# Patient Record
Sex: Male | Born: 1935 | Race: Black or African American | Hispanic: No | Marital: Single | Smoking: Former smoker
Health system: Southern US, Community
[De-identification: ages and names within clinical notes are randomized; demographics above are authoritative.]

## PROBLEM LIST (undated history)

## (undated) DIAGNOSIS — M109 Gout, unspecified: Secondary | ICD-10-CM

## (undated) DIAGNOSIS — R569 Unspecified convulsions: Secondary | ICD-10-CM

## (undated) DIAGNOSIS — G40909 Epilepsy, unspecified, not intractable, without status epilepticus: Secondary | ICD-10-CM

## (undated) DIAGNOSIS — I639 Cerebral infarction, unspecified: Secondary | ICD-10-CM

## (undated) DIAGNOSIS — I10 Essential (primary) hypertension: Secondary | ICD-10-CM

## (undated) DIAGNOSIS — J690 Pneumonitis due to inhalation of food and vomit: Secondary | ICD-10-CM

## (undated) DIAGNOSIS — F0390 Unspecified dementia without behavioral disturbance: Secondary | ICD-10-CM

## (undated) DIAGNOSIS — M6259 Muscle wasting and atrophy, not elsewhere classified, multiple sites: Secondary | ICD-10-CM

## (undated) DIAGNOSIS — I69354 Hemiplegia and hemiparesis following cerebral infarction affecting left non-dominant side: Secondary | ICD-10-CM

## (undated) DIAGNOSIS — I82409 Acute embolism and thrombosis of unspecified deep veins of unspecified lower extremity: Secondary | ICD-10-CM

## (undated) DIAGNOSIS — J45909 Unspecified asthma, uncomplicated: Secondary | ICD-10-CM

## (undated) DIAGNOSIS — R1310 Dysphagia, unspecified: Secondary | ICD-10-CM

## (undated) HISTORY — PX: KNEE SURGERY: SHX244

## (undated) HISTORY — PX: BACK SURGERY: SHX140

---

## 2012-12-17 ENCOUNTER — Encounter (HOSPITAL_COMMUNITY): Payer: Self-pay | Admitting: Emergency Medicine

## 2012-12-17 ENCOUNTER — Emergency Department (HOSPITAL_COMMUNITY)
Admission: EM | Admit: 2012-12-17 | Discharge: 2012-12-17 | Disposition: A | Discharge status: Home / Self Care | Payer: Medicare Other | Attending: Emergency Medicine | Admitting: Emergency Medicine

## 2012-12-17 ENCOUNTER — Emergency Department (HOSPITAL_COMMUNITY): Payer: Medicare Other

## 2012-12-17 DIAGNOSIS — Y9389 Activity, other specified: Secondary | ICD-10-CM | POA: Insufficient documentation

## 2012-12-17 DIAGNOSIS — M1990 Unspecified osteoarthritis, unspecified site: Secondary | ICD-10-CM | POA: Insufficient documentation

## 2012-12-17 DIAGNOSIS — S79919A Unspecified injury of unspecified hip, initial encounter: Secondary | ICD-10-CM | POA: Insufficient documentation

## 2012-12-17 DIAGNOSIS — Y92009 Unspecified place in unspecified non-institutional (private) residence as the place of occurrence of the external cause: Secondary | ICD-10-CM | POA: Insufficient documentation

## 2012-12-17 DIAGNOSIS — R296 Repeated falls: Secondary | ICD-10-CM | POA: Insufficient documentation

## 2012-12-17 HISTORY — DX: Cerebral infarction, unspecified: I63.9

## 2012-12-17 HISTORY — DX: Unspecified convulsions: R56.9

## 2012-12-17 HISTORY — DX: Essential (primary) hypertension: I10

## 2012-12-17 HISTORY — DX: Unspecified asthma, uncomplicated: J45.909

## 2012-12-17 MED ORDER — HYDROCODONE-ACETAMINOPHEN 5-325 MG PO TABS: 1.0000 | ORAL_TABLET | ORAL | Status: DC | PRN

## 2012-12-17 NOTE — ED Notes (Signed)
Pt reports doing yard work yesterday and fell, denies syncope or dizziness at that time. Now having bilateral knee pain.

## 2012-12-17 NOTE — ED Notes (Signed)
Asher Muir (daughter) [phone]

## 2012-12-17 NOTE — ED Provider Notes (Signed)
CSN: [id number]     Arrival date & time 12/17/12  1027 History   First MD Initiated Contact with Patient 12/17/12 1035     Chief Complaint  Patient presents with  . Fall   (Consider location/radiation/quality/duration/timing/severity/associated sxs/prior Treatment) HPI  78 year old male presents to ED for evaluation of bilateral knee pain secondary to mechanical fall. History of prior stroke affecting his speech. Patient has history of right knee surgery and has arthritis. He endorsed chronic right knee pain and right hip pain usually improved with taking Bayer aspirin. Yesterday he while raking the leaves outside, states his leg at ankle, fell on the right side. Did not hit his head or having any loss of consciousness. Denies any precipitating symptoms prior to the fall. Patient is at home with daughter and daughter noticed that he is having increased difficulty walking. He occasionally use a cane but having to use his crutches to move around. He reports pain as a sharp sensation, nonradiating, worsening with palpation. No complaints of headache, chest pain or shortness of breath, abdominal pain, new numbness or weakness. He does not he may have broken any bones. He is scheduled to be seen at the [facility] clinic on December 15 for further management of arthritis.  No past medical history on file. No past surgical history on file. No family history on file. History  Substance Use Topics  . Smoking status: Not on file  . Smokeless tobacco: Not on file  . Alcohol Use: Not on file    Review of Systems  Constitutional: Negative for fever.  Genitourinary: Negative for dysuria.  Musculoskeletal: Positive for arthralgias. Negative for back pain.  Skin: Negative for rash and wound.  Neurological: Negative for numbness.    Allergies  Review of patient's allergies indicates not on file.  Home Medications  No current outpatient prescriptions on file. There were no vitals taken for this  visit. Physical Exam  Nursing note and vitals reviewed. Constitutional: He appears well-developed and well-nourished. No distress.  HENT:  Head: Atraumatic.  Eyes: Conjunctivae are normal.  Neck: Normal range of motion. Neck supple.  Musculoskeletal: He exhibits tenderness (tenderness to R hip at the trochanteric region with palpation and with ROM, no deformity.  R knee: tenderness to anterior knee with no joint laxity, no obvious edema, no rash.  normal knee flexion/extension.).  No midline spine tenderness, crepitus, step off  Neurological: He is alert.  Skin: No rash noted.  Psychiatric: He has a normal mood and affect.    ED Course  Procedures (including critical care time)  12:17 PM Pt report having R hip and R knee pain after mechanical fall.  Able to ambulate.  Xray of R hip and R knee without acute fx or dislocation.  Evidence of severe osteoarthritis, which certainly can account for pt's pain.  Since aspirin at home isn't helping with pain, then will prescribe short course of pain meds.  Ortho referral given, pt also will f/u with PCP on Dec 15th for long term care.    Labs Review Labs Reviewed - No data to display Imaging Review Dg Hip Complete Right  12/17/2012   CLINICAL DATA:  Fall.  Right hip pain.  EXAM: RIGHT HIP - COMPLETE 2+ VIEW  COMPARISON:  None.  FINDINGS: No fracture or dislocation is identified. The patient has severe right hip osteoarthritis. Much milder degree of left hip degenerative disease is seen. Soft tissue structures are unremarkable.  IMPRESSION: No acute finding.  Severe right hip osteoarthritis.  Electronically Signed   By: Drusilla Kanner M.D.   On: 12/17/2012 11:58   Dg Knee Complete 4 Views Right  12/17/2012   CLINICAL DATA:  Fall.  Right knee pain.  EXAM: RIGHT KNEE - COMPLETE 4+ VIEW  COMPARISON:  None.  FINDINGS: No acute bony or joint abnormality is identified. The patient has severe tricompartmental osteoarthritis which is worst in the  lateral compartment. Small joint effusion is noted.  IMPRESSION: No acute finding.  Severe tricompartmental osteoarthritis.   Electronically Signed   By: Drusilla Kanner M.D.   On: 12/17/2012 11:59    EKG Interpretation   None       MDM   1. Osteoarthritis (arthritis due to wear and tear of joints)    BP 131/85  Temp(Src) 97.7 F (36.5 C) (Oral)  Resp 24  SpO2 99%  I have reviewed nursing notes and vital signs. I personally reviewed the imaging tests through PACS system  I reviewed available ER/hospitalization records thought the EMR     Fayrene Helper, PA-C 12/17/12 1223

## 2012-12-18 NOTE — ED Provider Notes (Signed)
Medical screening examination/treatment/procedure(s) were conducted as a shared visit with non-physician practitioner(s) and myself.  I personally evaluated the patient during the encounter.  EKG Interpretation   None       Pt s/p fall. No syncope or dizziness. No palpitations. No recent cp or sob. Head atraumatic. Spine nt. Good rom bil ext. No deformity.  Berlin Hun, MD 12/18/12 [phone]

## 2013-02-16 ENCOUNTER — Encounter (HOSPITAL_COMMUNITY): Payer: Self-pay | Admitting: Emergency Medicine

## 2013-02-16 ENCOUNTER — Emergency Department (HOSPITAL_COMMUNITY)
Admission: EM | Admit: 2013-02-16 | Discharge: 2013-02-16 | Disposition: A | Discharge status: Home / Self Care | Payer: Medicare Other | Attending: Emergency Medicine | Admitting: Emergency Medicine

## 2013-02-16 ENCOUNTER — Emergency Department (HOSPITAL_COMMUNITY): Payer: Medicare Other

## 2013-02-16 DIAGNOSIS — Z8673 Personal history of transient ischemic attack (TIA), and cerebral infarction without residual deficits: Secondary | ICD-10-CM | POA: Insufficient documentation

## 2013-02-16 DIAGNOSIS — Z8669 Personal history of other diseases of the nervous system and sense organs: Secondary | ICD-10-CM | POA: Insufficient documentation

## 2013-02-16 DIAGNOSIS — Z880 Allergy status to penicillin: Secondary | ICD-10-CM | POA: Insufficient documentation

## 2013-02-16 DIAGNOSIS — Z87891 Personal history of nicotine dependence: Secondary | ICD-10-CM | POA: Insufficient documentation

## 2013-02-16 DIAGNOSIS — M79609 Pain in unspecified limb: Secondary | ICD-10-CM | POA: Insufficient documentation

## 2013-02-16 DIAGNOSIS — R609 Edema, unspecified: Secondary | ICD-10-CM | POA: Insufficient documentation

## 2013-02-16 DIAGNOSIS — I10 Essential (primary) hypertension: Secondary | ICD-10-CM | POA: Insufficient documentation

## 2013-02-16 DIAGNOSIS — J45909 Unspecified asthma, uncomplicated: Secondary | ICD-10-CM | POA: Insufficient documentation

## 2013-02-16 LAB — CBC
HCT: 37.8 % — ABNORMAL LOW (ref 39.0–52.0)
HEMOGLOBIN: 12.6 g/dL — AB (ref 13.0–17.0)
MCH: 27.1 pg (ref 26.0–34.0)
MCHC: 33.3 g/dL (ref 30.0–36.0)
MCV: 81.3 fL (ref 78.0–100.0)
Platelets: 228 10*3/uL (ref 150–400)
RBC: 4.65 MIL/uL (ref 4.22–5.81)
RDW: 15.5 % (ref 11.5–15.5)
WBC: 5.7 10*3/uL (ref 4.0–10.5)

## 2013-02-16 LAB — BASIC METABOLIC PANEL
BUN: 12 mg/dL (ref 6–23)
CALCIUM: 9.9 mg/dL (ref 8.4–10.5)
CO2: 28 meq/L (ref 19–32)
CREATININE: 0.78 mg/dL (ref 0.50–1.35)
Chloride: 98 mEq/L (ref 96–112)
GFR calc Af Amer: >90 mL/min (ref >90)
GFR, EST NON AFRICAN AMERICAN: 85 mL/min — AB (ref >90)
GLUCOSE: 96 mg/dL (ref 70–99)
Potassium: 4.9 mEq/L (ref 3.7–5.3)
Sodium: 138 mEq/L (ref 137–147)

## 2013-02-16 LAB — POCT I-STAT TROPONIN I: TROPONIN I, POC: 0.01 ng/mL (ref 0.00–0.08)

## 2013-02-16 LAB — PRO B NATRIURETIC PEPTIDE: Pro B Natriuretic peptide (BNP): 27.4 pg/mL (ref 0–450)

## 2013-02-16 MED ORDER — TRAMADOL HCL 50 MG PO TABS
50.0000 mg | ORAL_TABLET | Freq: Once | ORAL | Status: AC
  Administered 2013-02-16: 50 mg via ORAL
  Filled 2013-02-16: qty 1

## 2013-02-16 NOTE — Progress Notes (Signed)
VASCULAR LAB PRELIMINARY  PRELIMINARY  PRELIMINARY  PRELIMINARY  Bilateral lower extremity venous duplex completed.    Preliminary report:  Bilateral:  No evidence of DVT, superficial thrombosis, or Baker's Cyst.   Naquan Garman, RVS 02/16/2013, 5:22 PM

## 2013-02-16 NOTE — ED Provider Notes (Addendum)
CSN: [id number]     Arrival date & time 02/16/13  1159 History   First MD Initiated Contact with Patient 02/16/13 1625     Chief Complaint  Patient presents with  . Leg Swelling   (Consider location/radiation/quality/duration/timing/severity/associated sxs/prior Treatment) HPI Patient presents with bilateral left greater than right lower extremity swelling and pain. Patient has not been immobile for the past month. He been sleeping sitting in chair with his legs in the floor. He denies any shortness of breath or chest pain. He has no history of prior lower sugary swelling. He's had no fever chills. Denies any trauma. Past Medical History  Diagnosis Date  . Stroke   . Hypertension   . Asthma   . Seizures    Past Surgical History  Procedure Laterality Date  . Knee surgery     History reviewed. No pertinent family history. History  Substance Use Topics  . Smoking status: Former Games developer  . Smokeless tobacco: Not on file  . Alcohol Use: No    Review of Systems  Constitutional: Negative for fever and chills.  Respiratory: Negative for cough and shortness of breath.   Cardiovascular: Positive for leg swelling. Negative for chest pain and palpitations.  Gastrointestinal: Negative for nausea, vomiting, abdominal pain and diarrhea.  Musculoskeletal: Positive for myalgias. Negative for back pain, neck pain and neck stiffness.  Skin: Negative for wound.  Neurological: Negative for dizziness, weakness, light-headedness, numbness and headaches.  All other systems reviewed and are negative.    Allergies  Penicillins  Home Medications   Current Outpatient Rx  Name  Route  Sig  Dispense  Refill  . aspirin (BAYER ASPIRIN) 325 MG tablet   Oral   Take 650 mg by mouth daily as needed for mild pain.         Marland Kitchen HYDROcodone-acetaminophen (NORCO/VICODIN) 5-325 MG per tablet   Oral   Take 1 tablet by mouth every 4 (four) hours as needed for severe pain.   10 tablet   0    BP 107/76   Pulse 83  Temp(Src) 97.5 F (36.4 C) (Oral)  Resp 17  Ht 5\' 6"  (1.676 m)  Wt 160 lb (72.576 kg)  BMI 25.84 kg/m2  SpO2 97% Physical Exam  Nursing note and vitals reviewed. Constitutional: He is oriented to person, place, and time. He appears well-developed and well-nourished. No distress.  HENT:  Head: Normocephalic and atraumatic.  Mouth/Throat: Oropharynx is clear and moist.  Eyes: EOM are normal. Pupils are equal, round, and reactive to light.  Neck: Normal range of motion. Neck supple.  Cardiovascular: Normal rate and regular rhythm.   Pulmonary/Chest: Effort normal and breath sounds normal. No respiratory distress. He has no wheezes. He has no rales. He exhibits no tenderness.  Abdominal: Soft. Bowel sounds are normal. He exhibits no distension and no mass. There is no tenderness. There is no rebound and no guarding.  Musculoskeletal: Normal range of motion. He exhibits edema and tenderness.  2+ pitting edema to left foot and ankle. Patient has Tightness and tenderness with palpation. 1+ pitting edema to the right foot and ankle. No calf tenderness or swelling on the right.  Neurological: He is alert and oriented to person, place, and time.  Skin: Skin is warm and dry. No rash noted. No erythema.  Psychiatric: He has a normal mood and affect. His behavior is normal.    ED Course  Procedures (including critical care time) Labs Review Labs Reviewed  CBC - Abnormal; Notable for the  following:    Hemoglobin 12.6 (*)    HCT 37.8 (*)    All other components within normal limits  BASIC METABOLIC PANEL - Abnormal; Notable for the following:    GFR calc non Af Amer 85 (*)    All other components within normal limits  PRO B NATRIURETIC PEPTIDE  POCT I-STAT TROPONIN I   Imaging Review Dg Chest 2 View  02/16/2013   CLINICAL DATA:  Bilateral leg swelling  EXAM: CHEST  2 VIEW  COMPARISON:  None.  FINDINGS: Cardiomediastinal silhouette is unremarkable. Mild degenerative changes  thoracic spine. No acute infiltrate or pleural effusion. No pulmonary edema. Degenerative changes right shoulder.  IMPRESSION: No active cardiopulmonary disease.   Electronically Signed   By: Natasha MeadLiviu  Pop M.D.   On: 02/16/2013 13:58    EKG Interpretation    Date/Time:  Tuesday February 16 2013 12:16:34 EST Ventricular Rate:  75 PR Interval:  174 QRS Duration: 72 QT Interval:  374 QTC Calculation: 417 R Axis:   143 Text Interpretation:  Normal sinus rhythm Lateral infarct , age undetermined Possible Inferior infarct , age undetermined Abnormal ECG Reconfirmed by Winfred Redel  MD, Zaul Hubers [phone]([id number]) on 02/16/2013 11:43:43 PM            MDM   Ultrasound negative for DVT. No evidence of congestive heart failure. Suspect dependent peripheral edema. Suggested compression hose and keep the feet elevated. Advised that he followup with his primary Dr. Return precautions given.   Loren Raceravid Maeva Dant, MD 02/16/13 [phone]  Loren Raceravid Tanisia Yokley, MD 02/16/13 [phone]

## 2013-02-16 NOTE — ED Notes (Signed)
Pt reports swelling to bilateral ankles x 2 days. Denies recent cough or sob. Denies hx of chf. Airway intact at triage, ekg being done.

## 2013-02-16 NOTE — ED Notes (Signed)
Pt daughter Asher MuirJamie contact number [phone]. Pt daughter reports pt needs food and drink.

## 2013-02-16 NOTE — Discharge Instructions (Signed)
Peripheral Edema °You have swelling in your legs (peripheral edema). This swelling is due to excess accumulation of salt and water in your body. Edema may be a sign of heart, kidney or liver disease, or a side effect of a medication. It may also be due to problems in the leg veins. Elevating your legs and using special support stockings may be very helpful, if the cause of the swelling is due to poor venous circulation. Avoid long periods of standing, whatever the cause. °Treatment of edema depends on identifying the cause. Chips, pretzels, pickles and other salty foods should be avoided. Restricting salt in your diet is almost always needed. Water pills (diuretics) are often used to remove the excess salt and water from your body via urine. These medicines prevent the kidney from reabsorbing sodium. This increases urine flow. °Diuretic treatment may also result in lowering of potassium levels in your body. Potassium supplements may be needed if you have to use diuretics daily. Daily weights can help you keep track of your progress in clearing your edema. You should call your caregiver for follow up care as recommended. °SEEK IMMEDIATE MEDICAL CARE IF:  °· You have increased swelling, pain, redness, or heat in your legs. °· You develop shortness of breath, especially when lying down. °· You develop chest or abdominal pain, weakness, or fainting. °· You have a fever. °Document Released: 02/22/2004 Document Revised: 04/08/2011 Document Reviewed: 02/01/2009 °ExitCare® Patient Information ©2014 [employer]. ° ° ° °Emergency Department Resource Guide °1) Find a Doctor and Pay Out of Pocket °Although you won't have to find out who is covered by your insurance plan, it is a good idea to ask around and get recommendations. You will then need to call the office and see if the doctor you have chosen will accept you as a new patient and what types of options they offer for patients who are self-pay. Some doctors offer  discounts or will set up payment plans for their patients who do not have insurance, but you will need to ask so you aren't surprised when you get to your appointment. ° °2) Contact Your Local Health Department °Not all health departments have doctors that can see patients for sick visits, but many do, so it is worth a call to see if yours does. If you don't know where your local health department is, you can check in your phone book. The CDC also has a tool to help you locate your state's health department, and many state websites also have listings of all of their local health departments. ° °3) Find a Walk-in Clinic °If your illness is not likely to be very severe or complicated, you may want to try a walk in clinic. These are popping up all over the country in pharmacies, drugstores, and shopping centers. They're usually staffed by nurse practitioners or physician assistants that have been trained to treat common illnesses and complaints. They're usually fairly quick and inexpensive. However, if you have serious medical issues or chronic medical problems, these are probably not your best option. ° °No Primary Care Doctor: °- Call [facility] at  832-8000 - they can help you locate a primary care doctor that  accepts your insurance, provides certain services, etc. °- Physician Referral Service- 1-800-533-3463 ° °Chronic Pain Problems: °Organization         Address  Phone   Notes  °[facility]  (336) 297-2271 Patients need to be referred by their primary care doctor.  ° °  Medication Assistance: °Organization         Address  Phone   Notes  °[place] [facility] [address] °[place], [place] [address] (336) 641-8030 --Must be a resident of [place] °-- Must have NO insurance coverage whatsoever (no Medicaid/ Medicare, etc.) °-- The pt. MUST have a primary care doctor that directs their care regularly and follows them in the community °  °[facility]   (866) 331-1348   °[employer]  (888) 892-1162   ° °Agencies that provide inexpensive medical care: °Organization         Address  Phone   Notes  °[facility]  (336) 832-8035   °[facility]    (336) 832-7272   °[facility] [address] °[place], [place] [address] (336) 832-4777   °[facility] [address], °[place] (336) 271-4999   °[facility]    (336) 373-0678   °[facility]    (336) 272-1050   °[facility] ° [address], [place] Phone:  (336) 832-4444, Fax:  (336) 832-4440 Hours of Operation:  9 am - 6 pm, M-F.  Also accepts Medicaid/Medicare and self-pay.  °[facility] ° [address], [place] Phone: (336) 832-3150, Fax: (336) 832-3151. Hours of Operation:  8:30 am - 5:30 pm, M-F.  Also accepts Medicaid and self-pay.  °[facility] [place] [address], [place] Phone: (336) 878-6027   °[facility] [address], [place], [place] (336)723-1848, Ext. 123 Mondays & Thursdays: 7-9 AM.  First 15 patients are seen on a first come, first serve basis. °  ° °Medicaid-accepting [place] Providers: ° °Organization         Address  Phone   Notes  °[facility] [address], [place] (336) 641-2100 Also accepts self-pay patients.  °[facility] [address], [place] ° (336) 856-9996   °[facility] [address], [place] (336) 288-8857   °[facility] [address] [place] [address], [place] (336) 299-7000   °Veita Bland [address], [place]  ° (336) 373-1557 Only accepts [place] Access Medicaid patients after they have their name applied to their card.  ° °Self-Pay (no insurance) in [place]: ° °Organization         Address  Phone   Notes  °Sickle Cell Patients, [facility] [address], [place] (336) 832-1970   °[facility] [address], [place] (336) 832-4400   °[facility] ° [address], [place] (336) 992-4800   °[facility]/Dr. Osei-Bonsu ° [address] [place] [address], [place] or [address], [place] (336) 841-8500 Phone number for both [place] and [place] locations is the same.  °[facility] [address], [place] (336) 299-0000   °[facility] [address] [place] [address], [place] or [address] (336) 852-7530 °(336) 878-2260   °[facility] [address], [place] (336) 350-1642, phone; (336) 294-5005, fax Sees patients 1st and 3rd Saturday of every month.  Must not qualify for public or private insurance (i.e. Medicaid, Medicare, [place] Health Choice, Veterans' Benefits) • Household income should be no more than 200% of the poverty level •The   clinic cannot treat you if you are pregnant or think you are pregnant • Sexually transmitted diseases are not treated at the clinic.  ° ° °Dental Care: °Organization         Address  Phone  Notes  °[facility] [address], [place] (336) 641-6152 Accepts children up to age 21 who are enrolled in Medicaid or [place] Health Choice; pregnant women with a Medicaid card; and children who have applied for Medicaid or [place] Health Choice, but were declined, whose parents can pay a reduced fee at time of service.  °[facility] [place]  [address], [place] (336) 641-7733 Accepts children up to age 21 who are enrolled in Medicaid or [place] Health Choice; pregnant women with a Medicaid card; and children who have applied for Medicaid or [place] Health Choice, but were declined, whose parents can pay a reduced fee at time of service.  °[facility] ° [address], [place]  (336) 641-4533 Patients are seen by appointment only. Walk-ins are not accepted. [facility] will see patients 18 years of age and older. °Monday - Tuesday (8am-5pm) °Most Wednesdays (8:30-5pm) °$30 per visit, cash only  °[facility] ° [address], [place] (336) 641-4533 Patients are seen by appointment only. Walk-ins are not accepted. [facility] will see patients 18 years of age and older. °One Wednesday Evening (Monthly: Volunteer Based).  $30 per visit, cash only  °[facility]  (919) 537-3737 for adults; Children under age 4, call Graduate Pediatric Dentistry at (919) [phone]. Children aged 4-14, please call (919) [phone] to request a pediatric application. ° Dental services are provided in all areas of dental care including fillings, crowns and bridges, complete and partial dentures, implants, gum treatment, root canals, and extractions. Preventive care is also provided. Treatment is provided to both adults and children. °Patients are selected via a lottery and there is often a waiting list. °  °[facility] [address], °[place] ° (336) 763-8833 [email or web address] °  °[facility] [address], [place], [place] (336)723-1848, Ext. 123 Second and Fourth Thursday of each month, opens at 6:30 AM; Clinic ends at 9 AM.  Patients are seen on a first-come first-served basis, and a limited number are seen during each clinic.  ° °[facility] ° [address], [place], [place] (336) 723-7904   Eligibility Requirements °You must have lived in [place], or [place] counties for at least the last three months. °  You cannot be eligible for state or federal sponsored healthcare insurance, including Veterans Administration, Medicaid, or Medicare. °  You generally cannot be eligible for healthcare insurance through your employer.  °  How to apply: °Eligibility screenings are held every Tuesday and Wednesday  afternoon from 1:00 pm until 4:00 pm. You do not need an appointment for the interview!  °[facility] [address], [place], [place] 336-631-2330   °[facility]  336-342-8273   °[facility]  336-703-3100   °[facility]  336-570-6415   ° °Behavioral Health Resources in the Community: °Intensive Outpatient Programs °Organization         Address  Phone  Notes  °[facility] [address], [place], [place] 336-878-6098   °[facility] [address], [place],   [place] 336-832-9800   °[facility] [address], [place], [place] ° 336-882-2125   °[facility] [address],  °[place], [place] 1-800-853-5163 or 336-641-4981   °Substance Abuse Resources °Organization         Address  Phone  Notes  °[facility]  336-882-2125   °[facility]  336-784-9470   °[facility]  336-285-9073   °[facility]  336-845-3988   °Residential & Outpatient Substance Abuse Program  1-800-659-3381   °Psychological Services °Organization         Address  Phone  Notes  °[facility]  336- 832-9600   °[facility]  336- 378-7881   °[facility] [address], [place] 1-800-853-5163 or 336-641-4981   ° °Mobile Crisis Teams °Organization         Address  Phone  Notes  °[facility], Mobile Crisis Care Unit  1-877-626-1772   °Assertive °[facility] ° [address] [place], [place] 336-834-9664   °Sharon DeEsch [address] °[place] 336-554-5454   ° °Self-Help/Support Groups °Organization         Address  Phone             Notes  °[facility] [place] - variety of support groups  336- 373-1402 Call for more information  °Narcotics Anonymous (NA), [facility] [address], °[place]  2 meetings at this location  ° °Residential Treatment  Programs °Organization         Address  Phone  Notes  °[facility] [address],    °[place]  1-866-801-8205   °[facility] ° [address], [place], [place] 704-293-8524   °[facility] [address], [place] 336-845-3988 Admissions: 8am-3pm M-F  °[facility] [address]    °[place], [place] 336-841-1104   °[facility] [address], [place], [place] 336-379-7146   °[facility] [address]  °[place], [place] 336-285-9073   °[facility] - Intensive Outpatient [address], [place], [place] 336-852-3033   °[facility] [address]  °[place], [place] 1-877-615-2722 or 336-784-9470   °[facility] [address] [place] [address] [place], [place] 336-227-7417 Accepts Medicaid  °[facility] [address]  °[place] 1-800-659-3381 Substance Abuse/Addiction Treatment  ° °[place] Behavioral Health Resources °Organization         Address  Phone  Notes  °[facility]  (888) 581-9988   °Julie Brannon, PhD [address] [place], [place]   (336) 349-5553 or (336) 951-0000   °[facility]   [address] °[place], [place] (336) 349-4454   °[facility] [address], [place], [place] (336) 342-8316 Insurance/Medicaid/sponsorship through [facility]  °[facility] [address]                                    [place], [place] (336) 342-8316 Therapy/tele-psych/case  °[facility] [address]  ° [place], [place] (336) 349-2233    °Dr. Arfeen  (336) 349-4544   °[facility] [place]  [employer] [place] [facility] 1) [address],  °2) [address], [place] °3)  [address], [place] (336) 349-3220 °(336) 342-7768 ° °(  336) 342-8140   °[place] [facility] (336) 342-1394 or (336) 342-3537 (After Hours)    ° ° ° °

## 2013-02-17 DIAGNOSIS — M7989 Other specified soft tissue disorders: Secondary | ICD-10-CM

## 2013-02-17 DIAGNOSIS — M79609 Pain in unspecified limb: Secondary | ICD-10-CM

## 2013-06-25 ENCOUNTER — Encounter (HOSPITAL_COMMUNITY): Payer: Self-pay | Admitting: Emergency Medicine

## 2013-06-25 ENCOUNTER — Emergency Department (HOSPITAL_COMMUNITY)
Admission: EM | Admit: 2013-06-25 | Discharge: 2013-06-25 | Disposition: A | Discharge status: Home / Self Care | Payer: Medicare Other | Attending: Emergency Medicine | Admitting: Emergency Medicine

## 2013-06-25 DIAGNOSIS — M79606 Pain in leg, unspecified: Secondary | ICD-10-CM

## 2013-06-25 DIAGNOSIS — G8929 Other chronic pain: Secondary | ICD-10-CM

## 2013-06-25 DIAGNOSIS — M629 Disorder of muscle, unspecified: Secondary | ICD-10-CM | POA: Insufficient documentation

## 2013-06-25 DIAGNOSIS — M79609 Pain in unspecified limb: Secondary | ICD-10-CM | POA: Insufficient documentation

## 2013-06-25 DIAGNOSIS — Z87891 Personal history of nicotine dependence: Secondary | ICD-10-CM | POA: Insufficient documentation

## 2013-06-25 DIAGNOSIS — I10 Essential (primary) hypertension: Secondary | ICD-10-CM | POA: Insufficient documentation

## 2013-06-25 DIAGNOSIS — Z8673 Personal history of transient ischemic attack (TIA), and cerebral infarction without residual deficits: Secondary | ICD-10-CM | POA: Insufficient documentation

## 2013-06-25 DIAGNOSIS — Z79899 Other long term (current) drug therapy: Secondary | ICD-10-CM | POA: Insufficient documentation

## 2013-06-25 DIAGNOSIS — Z880 Allergy status to penicillin: Secondary | ICD-10-CM | POA: Insufficient documentation

## 2013-06-25 DIAGNOSIS — J45909 Unspecified asthma, uncomplicated: Secondary | ICD-10-CM | POA: Insufficient documentation

## 2013-06-25 DIAGNOSIS — M2420 Disorder of ligament, unspecified site: Secondary | ICD-10-CM | POA: Insufficient documentation

## 2013-06-25 MED ORDER — DOXYCYCLINE HYCLATE 100 MG PO CAPS: 100.0000 mg | ORAL_CAPSULE | Freq: Two times a day (BID) | ORAL | Status: DC

## 2013-06-25 MED ORDER — BENZONATATE 100 MG PO CAPS: 100.0000 mg | ORAL_CAPSULE | Freq: Three times a day (TID) | ORAL | Status: DC

## 2013-06-25 MED ORDER — TRAMADOL HCL 50 MG PO TABS: 50.0000 mg | ORAL_TABLET | Freq: Four times a day (QID) | ORAL | Status: DC | PRN

## 2013-06-25 MED ORDER — TRAMADOL HCL 50 MG PO TABS
50.0000 mg | ORAL_TABLET | Freq: Once | ORAL | Status: AC
  Administered 2013-06-25: 50 mg via ORAL
  Filled 2013-06-25: qty 1

## 2013-06-25 NOTE — ED Notes (Signed)
Care management at bedside.

## 2013-06-25 NOTE — ED Notes (Signed)
Care management aware of order and family/daughter request for help at home.

## 2013-06-25 NOTE — ED Notes (Addendum)
Upon assessment pt unable to lift right leg. Pt flexion and dorsiflexion weaker on right side. Pt reports this is his normal since his stroke. Pt DENIES change in symptoms/defects.   Gwendolyn Grant MD notified of ALL assessment detail up to this pt. Verbalizes will be in to assess pt.

## 2013-06-25 NOTE — ED Notes (Signed)
Bed: WA23 Expected date:  Expected time:  Means of arrival:  Comments: EMS-forgot 

## 2013-06-25 NOTE — ED Notes (Signed)
[place] MD at bedside. 

## 2013-06-25 NOTE — Progress Notes (Signed)
06/25/2013 A. Orman Matsumura RNCM 1932pm Patient's daughter reports patient's pcp is located at the [place] [facility] in [facility].  System updated.

## 2013-06-25 NOTE — Progress Notes (Signed)
06/25/2013 Johnathan Mitchell RNCM 1926pm EDCM spoke to daughter Johnathan Mitchell who is now aware and agreeable of home health services. Patient's daughter reports patient does not have a Medicare card.  EDCM informed patient's daughter she may call Medicare to request a new card.  EDCM provided patient's daughter with contact information for Medicare.  EDCM also provided patient with list of doctors who accept Medicare insurance within a ten mile radius of patient's zip code.  Patient's daughter requesting wheelchair for patient.  EDCM will ask EDP to write prescritption for manual wheelchair for patient so that patient's daughter may purchase wheelchair from medical supply store.  Patient's daughter thankful for resources.  No further EDCM need at this time.

## 2013-06-25 NOTE — ED Notes (Signed)
Per Gwendolyn Grant spoke with pt daughter. Daughter reports pt pain so severe they decided to send to hospital to control pain. Daughter request help at home.

## 2013-06-25 NOTE — ED Notes (Signed)
Pt reports has not been able to take off socks so has showered with them for 3 days without taking them off. Pt feet dry and flaky bilaterally.

## 2013-06-25 NOTE — Progress Notes (Signed)
  CARE MANAGEMENT ED NOTE 06/25/2013  Patient:  Johnathan Mitchell, Johnathan Mitchell   Account Number:  [id number]  Date Initiated:  06/25/2013  Documentation initiated by:  Radford Pax  Subjective/Objective Assessment:   Patient presents to Ed with pain to right leg.     Subjective/Objective Assessment Detail:   Patient has a history of chronic leg pain.     Action/Plan:   Action/Plan Detail:   Anticipated DC Date:  06/25/2013     Status Recommendation to Physician:   Result of Recommendation:    Other ED Services  Consult Working Plan    DC Planning Services  Other  PCP issues   [facility] Choice  HOME HEALTH   Choice offered to / List presented to:  C-1 Patient     HH arranged  HH-1 RN  HH-2 PT  HH-3 OT  HH-4 NURSE'S AIDE  HH-6 SOCIAL WORKER      HH agency  [facility]    Status of service:  Completed, signed off  ED Comments:   ED Comments Detail:  EDCM spoke to patient at bedside.  Patient reports he lives at home with his daughter. Patient currently does not have any home health services.  Patient reports he has a walker, cane and safety rails in the bathroom at home.  Patient reports he has the "grabbers"  that help him pick up things.  Patient reports he washes and dresses himself and his daughter helps him with meals.  Patient reports he is able to ambulate with his cane at home without difficulty. EDCM provided patient with a list of home health agencies in [facility]  EDCM explained to patient that with home health he will be able to receive a visiting RN, PT, OT, aide and social worker if needed.  EDCM explained to patient the agency of his choice will have 24-48 hours to contact him. Patient has chosen [facility].  EDCM also provided patient with list of private duty nursing services and printed information regarding the [facility] [place].  EDCM attempted to call patient's daughter Asher Muir at [phone] without success.  EDCM discussed patient with  EDP who will place home health orders for RN, PT, OT, aide and social worker with face to face.  Patient thankful for assistance.  No further EDCM needs at this time.

## 2013-06-25 NOTE — ED Provider Notes (Signed)
CSN: [id number]     Arrival date & time 06/25/13  1714 History   First MD Initiated Contact with Patient 06/25/13 1715     Chief Complaint  Patient presents with  . Right Leg Pain      (Consider location/radiation/quality/duration/timing/severity/associated sxs/prior Treatment) Patient is a 79 y.o. male presenting with leg pain. The history is provided by the patient.  Leg Pain Location:  Leg Time since incident: Years. Leg location:  L leg, R leg and R upper leg Pain details:    Quality:  Aching   Radiates to:  Does not radiate   Severity:  Moderate   Onset quality:  Gradual Associated symptoms: no fever     Past Medical History  Diagnosis Date  . Hypertension   . Asthma   . Seizures   . Stroke    Past Surgical History  Procedure Laterality Date  . Knee surgery     No family history on file. History  Substance Use Topics  . Smoking status: Former Games developermoker  . Smokeless tobacco: Not on file  . Alcohol Use: No    Review of Systems  Constitutional: Negative for fever.  Respiratory: Negative for cough and shortness of breath.   Gastrointestinal: Negative for vomiting.  All other systems reviewed and are negative.     Allergies  Penicillins  Home Medications   Prior to Admission medications   Medication Sig Start Date End Date Taking? Authorizing Provider  atorvastatin (LIPITOR) 20 MG tablet Take 10 mg by mouth at bedtime.    Historical Provider, MD  donepezil (ARICEPT) 10 MG tablet Take 5 mg by mouth every morning.    Historical Provider, MD  etodolac (LODINE) 200 MG capsule Take 200 mg by mouth daily as needed for moderate pain.     Historical Provider, MD  lisinopril (PRINIVIL,ZESTRIL) 5 MG tablet Take 2.5 mg by mouth daily.    Historical Provider, MD  naproxen sodium (ANAPROX) 220 MG tablet Take 440 mg by mouth daily as needed (for pain).    Historical Provider, MD   BP 129/77  Pulse 66  Temp(Src) 98.6 F (37 C) (Oral)  Resp 12  SpO2 100% Physical  Exam  Nursing note and vitals reviewed. Constitutional: He is oriented to person, place, and time. He appears well-developed and well-nourished. No distress.  HENT:  Head: Normocephalic and atraumatic.  Mouth/Throat: No oropharyngeal exudate.  Eyes: EOM are normal. Pupils are equal, round, and reactive to light.  Neck: Normal range of motion. Neck supple.  Cardiovascular: Normal rate and regular rhythm.  Exam reveals no friction rub.   No murmur heard. Pulmonary/Chest: Effort normal and breath sounds normal. No respiratory distress. He has no wheezes. He has no rales.  Abdominal: Soft. He exhibits no distension. There is no tenderness. There is no rebound.  Musculoskeletal: Normal range of motion. He exhibits no edema.  R leg weaknes R leg diffuse tenderness, no signs of trauma, no swelling  Neurological: He is alert and oriented to person, place, and time.  Skin: He is not diaphoretic.    ED Course  Procedures (including critical care time) Labs Review Labs Reviewed - No data to display  Imaging Review No results found.   EKG Interpretation None      MDM   Final diagnoses:  Chronic leg pain    78M here with chronic leg pain. States leg pain present since he had his stroke a long time ago. No acute worsening. No falls. Cannot move R leg, not  new - had difficulty with movement since the stroke. He cannot remember why he is here. He lives with his daughter, will call his daughter to speak with her. Vitals stable. Patient relaxing comfortably.  I spoke with daughter - she states worsening pain of late - reports increasing falls - 3 over last 6 months. She expressed the need for home health. She denies any falls today, any trauma. Case management spoke with patient, HomeHealth face-to-face eval recommended, orders put in by me. Case management to speak with daughter. Will give tramadol for stronger pain relief.   Dagmar Hait, MD 06/25/13 1911

## 2013-06-25 NOTE — Discharge Instructions (Signed)
Chronic Pain Chronic pain can be defined as pain that is off and on and lasts for 3 6 months or longer. Many things cause chronic pain, which can make it difficult to make a diagnosis. There are many treatment options available for chronic pain. However, finding a treatment that works well for you may require trying various approaches until the right one is found. Many people benefit from a combination of two or more types of treatment to control their pain. SYMPTOMS  Chronic pain can occur anywhere in the body and can range from mild to very severe. Some types of chronic pain include:  Headache.  Low back pain.  Cancer pain.  Arthritis pain.  Neurogenic pain. This is pain resulting from damage to nerves. People with chronic pain may also have other symptoms such as:  Depression.  Anger.  Insomnia.  Anxiety. DIAGNOSIS  Your health care provider will help diagnose your condition over time. In many cases, the initial focus will be on excluding possible conditions that could be causing the pain. Depending on your symptoms, your health care provider may order tests to diagnose your condition. Some of these tests may include:   Blood tests.   CT scan.   MRI.   X-rays.   Ultrasounds.   Nerve conduction studies.  You may need to see a specialist.  TREATMENT  Finding treatment that works well may take time. You may be referred to a pain specialist. He or she may prescribe medicine or therapies, such as:   Mindful meditation or yoga.  Shots (injections) of numbing or pain-relieving medicines into the spine or area of pain.  Local electrical stimulation.  Acupuncture.   Massage therapy.   Aroma, color, light, or sound therapy.   Biofeedback.   Working with a physical therapist to keep from getting stiff.   Regular, gentle exercise.   Cognitive or behavioral therapy.   Group support.  Sometimes, surgery may be recommended.  HOME CARE INSTRUCTIONS    Take all medicines as directed by your health care provider.   Lessen stress in your life by relaxing and doing things such as listening to calming music.   Exercise or be active as directed by your health care provider.   Eat a healthy diet and include things such as vegetables, fruits, fish, and lean meats in your diet.   Keep all follow-up appointments with your health care provider.   Attend a support group with others suffering from chronic pain. SEEK MEDICAL CARE IF:   Your pain gets worse.   You develop a new pain that was not there before.   You cannot tolerate medicines given to you by your health care provider.   You have new symptoms since your last visit with your health care provider.  SEEK IMMEDIATE MEDICAL CARE IF:   You feel weak.   You have decreased sensation or numbness.   You lose control of bowel or bladder function.   Your pain suddenly gets much worse.   You develop shaking.  You develop chills.  You develop confusion.  You develop chest pain.  You develop shortness of breath.  MAKE SURE YOU:  Understand these instructions.  Will watch your condition.  Will get help right away if you are not doing well or get worse. Document Released: 10/06/2001 Document Revised: 09/16/2012 Document Reviewed: 07/10/2012 [facility] Patient Information 2014 [place], [place].  Bronchitis Bronchitis is inflammation of the airways that extend from the windpipe into the lungs (bronchi). The inflammation often  causes mucus to develop, which leads to a cough. If the inflammation becomes severe, it may cause shortness of breath. CAUSES  Bronchitis may be caused by:   Viral infections.   Bacteria.   Cigarette smoke.   Allergens, pollutants, and other irritants.  SIGNS AND SYMPTOMS  The most common symptom of bronchitis is a frequent cough that produces mucus. Other symptoms include:  Fever.   Body aches.   Chest congestion.    Chills.   Shortness of breath.   Sore throat.  DIAGNOSIS  Bronchitis is usually diagnosed through a medical history and physical exam. Tests, such as chest X-rays, are sometimes done to rule out other conditions.  TREATMENT  You may need to avoid contact with whatever caused the problem (smoking, for example). Medicines are sometimes needed. These may include:  Antibiotics. These may be prescribed if the condition is caused by bacteria.  Cough suppressants. These may be prescribed for relief of cough symptoms.   Inhaled medicines. These may be prescribed to help open your airways and make it easier for you to breathe.   Steroid medicines. These may be prescribed for those with recurrent (chronic) bronchitis. HOME CARE INSTRUCTIONS  Get plenty of rest.   Drink enough fluids to keep your urine clear or pale yellow (unless you have a medical condition that requires fluid restriction). Increasing fluids may help thin your secretions and will prevent dehydration.   Only take over-the-counter or prescription medicines as directed by your health care provider.  Only take antibiotics as directed. Make sure you finish them even if you start to feel better.  Avoid secondhand smoke, irritating chemicals, and strong fumes. These will make bronchitis worse. If you are a smoker, quit smoking. Consider using nicotine gum or skin patches to help control withdrawal symptoms. Quitting smoking will help your lungs heal faster.   Put a cool-mist humidifier in your bedroom at night to moisten the air. This may help loosen mucus. Change the water in the humidifier daily. You can also run the hot water in your shower and sit in the bathroom with the door closed for 5 10 minutes.   Follow up with your health care provider as directed.   Wash your hands frequently to avoid catching bronchitis again or spreading an infection to others.  SEEK MEDICAL CARE IF: Your symptoms do not improve  after 1 week of treatment.  SEEK IMMEDIATE MEDICAL CARE IF:  Your fever increases.  You have chills.   You have chest pain.   You have worsening shortness of breath.   You have bloody sputum.  You faint.  You have lightheadedness.  You have a severe headache.   You vomit repeatedly. MAKE SURE YOU:   Understand these instructions.  Will watch your condition.  Will get help right away if you are not doing well or get worse. Document Released: 01/14/2005 Document Revised: 11/04/2012 Document Reviewed: 09/08/2012 [facility] Patient Information 2014 [place], [place].   Emergency Department Resource Guide 1) Find a Doctor and Pay Out of Pocket Although you won't have to find out who is covered by your insurance plan, it is a good idea to ask around and get recommendations. You will then need to call the office and see if the doctor you have chosen will accept you as a new patient and what types of options they offer for patients who are self-pay. Some doctors offer discounts or will set up payment plans for their patients who do not have insurance, but you  will need to ask so you aren't surprised when you get to your appointment.  2) Contact Your Local Health Department Not all health departments have doctors that can see patients for sick visits, but many do, so it is worth a call to see if yours does. If you don't know where your local health department is, you can check in your phone book. The CDC also has a tool to help you locate your state's health department, and many state websites also have listings of all of their local health departments.  3) Find a Walk-in Clinic If your illness is not likely to be very severe or complicated, you may want to try a walk in clinic. These are popping up all over the country in pharmacies, drugstores, and shopping centers. They're usually staffed by nurse practitioners or physician assistants that have been trained to treat common  illnesses and complaints. They're usually fairly quick and inexpensive. However, if you have serious medical issues or chronic medical problems, these are probably not your best option.  No Primary Care Doctor: - Call Health Connect at  [phone] - they can help you locate a primary care doctor that  accepts your insurance, provides certain services, etc. - Physician Referral Service- [phone]  Chronic Pain Problems: Organization         Address  Phone   Notes  [facility]  [phone] Patients need to be referred by their primary care doctor.   Medication Assistance: Organization         Address  Phone   Notes  [facility]ication [facility] [address] [place], [place] [id number] [phone] --Must be a resident of [place] [facility] [place] -- Must have NO insurance coverage whatsoever (no Medicaid/ Medicare, etc.) -- The pt. MUST have a primary care doctor that directs their care regularly and follows them in the community   MedAssist  [phone]   [employer]  [phone]    Agencies that provide inexpensive medical care: Organization         Address  Phone   Notes  [facility]  [phone]) [phone]   [facility]    [phone]   [facility] [address] [place], [place] [address] [phone]   [facility] [address], [place] [phone]   Planned Parenthood    [phone]   [facility]    [phone]   Community Health and [facility]  [address], [place] Phone:  [phone], Fax:  [phone] Hours of Operation:  9 am - 6 pm, M-F.  Also accepts Medicaid/Medicare and self-pay.  [facility]  [address], [place] Phone: [phone], Fax: [phone]. Hours of Operation:  8:30 am - 5:30 pm, M-F.  Also accepts Medicaid and self-pay.  [facility] [place]  [address], [place] Phone: [phone]   [facility] [address], [place], [place] [phone], Ext. [phone] Mondays & Thursdays: 7-9 AM.  First 15 patients are seen on a first come, first serve basis.    Medicaid-accepting [facility] Providers:  Organization         Address  Phone   Notes  [facility] [address], [place] [phone]) [phone] Also accepts self-pay patients.  [facility] [address], [place] [address],  [place]  [phone]   [facility] [address], [place] [phone]   [facility] [address], [place] [phone]   Renaye Rakers [address], [place]   [phone] Only accepts [place] Access [place] patients after they have their name applied to their card.   Self-Pay (no insurance) in [facility]:  Organization         Address  Phone   Notes  Sickle Cell Patients, [facility] [address] [place], [place] [phone]   [facility] [address] [place], [place] [phone]   [facility] [place]  [address], [place] [phone]   [facility]/Dr. Osei-Bonsu  [address] [place] or [address], [place] [phone] Phone number for both [place] and [place] locations is the same.  Urgent Medical and [facility] [address], [place] [phone]   [facility] [address], [place] or [address] [phone]   [facility] [address] [place] [phone], phone; [phone], fax Sees patients 1st and 3rd Saturday of every month.  Must not qualify for public or private insurance (i.e. Medicaid, Medicare, [place] Health Choice, Veterans' Benefits)  Household income should be no more than 200% of the  poverty level The clinic cannot treat you if you are pregnant or think you are pregnant  Sexually transmitted diseases are not treated at the clinic.    Dental Care: Organization         Address  Phone  Notes  [facility] Department of [facility] [address] [place], [place] [phone] Accepts children up to age 72 who are enrolled in [place] or [place] Health Choice; pregnant women with a Medicaid card; and children who have applied for Medicaid or  Health Choice, but were declined, whose parents can pay a reduced fee at time of service.  [facility] Department of [facility]  [address], [place] [phone] Accepts children up to age 77 who are enrolled in [place] or  Health Choice; pregnant women with a Medicaid card; and children who have applied for Medicaid or  Health Choice, but were declined, whose parents can pay a reduced fee at time of service.  [facility]  [address] [place], [place] [phone] Patients are seen by appointment only. Walk-ins are not accepted. [facility] will see patients 19 years of age and older. Monday - Tuesday (8am-5pm) Most Wednesdays (8:30-5pm) $30 per visit, cash only  [facility]  [address], [facility] [phone] Patients are seen by appointment only. Walk-ins are not accepted. [facility] will see patients 61 years of age and older. One Wednesday Evening (Monthly: Volunteer Based).  $30 per visit, cash only  [employer]  [phone] for adults; Children under age 6, call Graduate Pediatric Dentistry at [phone]. Children aged 23-14, please call [phone] to request a pediatric application.  Dental services are provided in all areas of dental care including fillings, crowns and bridges, complete and partial dentures, implants, gum treatment, root canals, and extractions.  Preventive care is also provided. Treatment is provided to both adults and children. Patients are selected via a lottery and  there is often a waiting list.   [facility] [address] [place]  [phone] [email or web address]   [facility] [address] [place], [place] [phone], Ext. [phone] Second and Fourth Thursday of each month, opens at 6:30 AM; Clinic ends at 9 AM.  Patients are seen on a first-come first-served basis, and a limited number are seen during each clinic.   [facility]  [address] [place], [place] [phone]   Eligibility Requirements You must have lived in [place], [place], or [place] counties for at least the last three months.   You cannot be eligible for state or federal sponsored [place], including [employer], [place], or [employer].   You generally cannot be eligible for healthcare insurance through your employer.    How to apply: Eligibility screenings are held every Tuesday and Wednesday afternoon from 1:00 pm until 4:00 pm. You do not need an appointment for the interview!  [facility] [address], [place], [place] [phone]   [facility] Health [facility]  [phone]   [facility] Health Department  [phone]   [facility] Health Department  [phone]    Behavioral Health Resources in the Community: Intensive Outpatient Programs Organization         Address  Phone  Notes  [facility] Services [address] [place], [place] [phone]   [facility] Outpatient [address], [place], [place] [phone]   [facility] [address] [place], [place]  [phone]   [facility] [address],  [place], [place] [phone] or [phone]   Substance Abuse Resources Organization         Address  Phone  Notes  [facility] Drug Services  [phone]   Addiction  [facility]  [phone]   [facility] [place]  [phone]   Floydene Flock  [phone]   Residential & Outpatient Substance Abuse Program  [phone]   Psychological Services Organization         Address  Phone  Notes  [facility] Behavioral Health  [phone]   [facility] Services  [phone]   [facility] Mental Health [address] [place] [phone] or [phone]    Mobile Crisis Teams Organization         Address  Phone  Notes  [facility], Mobile Crisis Care Unit  [phone]   [facility]  [address] [place], [place] [phone]   Doristine Locks [address] [place] [phone]    Self-Help/Support Groups Organization         Address  Phone             Notes  [facility] [place] - variety of support groups  336- I7437963 Call for more information  Narcotics Anonymous (NA), [facility] [address], [employer] [place]  2 meetings at this location   Statistician         Address  Phone  Notes  [facility] [address],    [place]  [phone]   [facility]  [address] [place] [address], [place], [place] [phone]   [facility] [address] [place] [address] [place], [place] [phone] Admissions: 8am-3pm M-F  [facility] [address],    [place], [place] [phone]   [facility] [address] [place], [place], [place] [phone]  [facility] [address],  [place], [place] [phone]   [facility] - Intensive Outpatient [address], [place], [place] [phone]   [facility] (Addiction [facility] [address] [place]  [place], [place] [phone] or [phone]   [facility] ([facility]) [address], [place], [place] [phone] Accepts Medicaid  [facility] [address],  [place]  [phone] Substance Abuse/Addiction Treatment   [facility] Organization         Address  Phone  Notes  [facility]  [phone]   Angie Fava, PhD [address] [place] [address]vin Knack [place], [place]   [phone] or [phone]   [facility]al   [address] [place], [place] [phone]   [facility] [address], [place], [place] [phone] Insurance/Medicaid/sponsorship through [facility] and Families [address]                                    [place], [place] [phone] Therapy/tele-psych/case  [facility] [address], [place] [phone]    Dr. Lolly Mustache  [phone]   [facility] [place]  [employer] [facility] Dept. 1) [address] [place] 2) [address], [place] 3)  [address], [place] [phone]  [phone]   [facility] Child Abuse Hotline 979-629-5452 or 952-220-0981 (After Hours)

## 2013-06-25 NOTE — Progress Notes (Signed)
06/25/2013 A. Ahja Martello RNCM 2256pm Received phone call from EDRN looking for patient's daughter's phone number as patient's daughter reports she did not know she had to be at the house when patient came home.  Patient's daughter claimed she had an emergency and had to go out of town as per [employer]. As per EDRN, patient's daughter reported it was okay for the ambulance to walk into the house and leave patient there.  EDCM spoke to patient's daughter earlier in the evening informing her that the ambulance would be bringing patient home due to her concerns for patient's safety.  At that time patient's daughter was close to the hospital  but stated, "It's okay, I will just get him something to eat, I know he's hungry and go back home."  [facility] asked patient's daughter, "So you will be home when the patient gets home?"  Patient's daughter stated, "Yes."  With concern for patient's safety,  [facility] consulted EDSW for APS report.  No further EDCM needs at this time.

## 2013-06-25 NOTE — ED Notes (Signed)
Pt unable to ambulate do to pain right leg. Contacting ptar to transport pt home.

## 2013-06-25 NOTE — ED Notes (Signed)
Per EMS pt complaint of chronic right leg pain that is unchanged from his normal.

## 2013-06-25 NOTE — Progress Notes (Signed)
06/25/2013 A. Brendin Situ RNCM 1840RF Amb[facility] to take patient home, patient's daughter aware.  During previous conversation, patient's daughter reported patient did have private duty nursing services with [facility] for about three years.  Patient's daughter reports she will look into having private duty nursing services come to the home again. Patient has a walker, cane, bedside commode, crutches, urinal, a ramp and safety rails in the bathroom at home.  [facility] home health orders for RN, PT, OT, aide and social worker to [facility] at 2018pm  with confirmation of receipt at 2023pm.  No further [facility] needs at this time.

## 2013-06-25 NOTE — Progress Notes (Signed)
CSW was requested by nursing staff to complete an APS reports based on the concerns with the patient's safety at home.  CSW spoke with Baird Lyons with APS and completed the reports and she states someone will follow up within 48-72 hours.     Maryelizabeth Rowan, MSW, [place], 06/25/2013 Evening Clinical Social Worker [phone]

## 2013-07-05 NOTE — Progress Notes (Signed)
07/05/2013 A. Tykwon Fera RNCM 1553pm EDCM called [facility] and spoke to [employer] who reports patient is scheduled for services on Wednesday.  EDCM called patient's dauhter Asher Muir at [phone]  for follow up who reports, "We have already had so many people coming to the house.  We have physical therapy, occupatoinal therapy, speech therapy, we have everything."  Patient's daughter also reports she has spoken to patient's primary doctor at the [place] and reports they may be able to get the patient a free wheelchair and to receive medical care at home instead of taking the patient to the clinic.  Patient's daughter Asher Muir reports the Adult Protective services has been a great  help.   Patient's daughter reports her father is doing great and is extremely thankful for assistance [facility] has provided. Patient's daughter feel that the patient is safe. No further EDCM needs at this time.

## 2013-09-16 ENCOUNTER — Ambulatory Visit: Payer: Medicare Other | Admitting: Physical Therapy

## 2013-11-18 ENCOUNTER — Ambulatory Visit
Payer: Medicare Other | Attending: Rehabilitative and Restorative Service Providers" | Admitting: Rehabilitative and Restorative Service Providers"

## 2014-02-01 ENCOUNTER — Inpatient Hospital Stay (HOSPITAL_COMMUNITY)
Admission: EM | Admit: 2014-02-01 | Discharge: 2014-02-02 | DRG: 066 | Disposition: A | Discharge status: Home / Self Care | Payer: Medicare Other | Attending: Internal Medicine | Admitting: Internal Medicine

## 2014-02-01 ENCOUNTER — Emergency Department (HOSPITAL_COMMUNITY): Payer: Medicare Other

## 2014-02-01 ENCOUNTER — Encounter (HOSPITAL_COMMUNITY): Payer: Self-pay | Admitting: Emergency Medicine

## 2014-02-01 ENCOUNTER — Inpatient Hospital Stay (HOSPITAL_COMMUNITY): Payer: Medicare Other

## 2014-02-01 DIAGNOSIS — I10 Essential (primary) hypertension: Secondary | ICD-10-CM | POA: Diagnosis present

## 2014-02-01 DIAGNOSIS — Z880 Allergy status to penicillin: Secondary | ICD-10-CM | POA: Diagnosis not present

## 2014-02-01 DIAGNOSIS — Z993 Dependence on wheelchair: Secondary | ICD-10-CM

## 2014-02-01 DIAGNOSIS — R29810 Facial weakness: Secondary | ICD-10-CM

## 2014-02-01 DIAGNOSIS — R569 Unspecified convulsions: Secondary | ICD-10-CM | POA: Diagnosis present

## 2014-02-01 DIAGNOSIS — R531 Weakness: Secondary | ICD-10-CM | POA: Diagnosis present

## 2014-02-01 DIAGNOSIS — J45909 Unspecified asthma, uncomplicated: Secondary | ICD-10-CM | POA: Diagnosis present

## 2014-02-01 DIAGNOSIS — Z87891 Personal history of nicotine dependence: Secondary | ICD-10-CM | POA: Diagnosis not present

## 2014-02-01 DIAGNOSIS — E875 Hyperkalemia: Secondary | ICD-10-CM | POA: Diagnosis present

## 2014-02-01 DIAGNOSIS — M25562 Pain in left knee: Secondary | ICD-10-CM

## 2014-02-01 DIAGNOSIS — I639 Cerebral infarction, unspecified: Principal | ICD-10-CM | POA: Diagnosis present

## 2014-02-01 DIAGNOSIS — G5621 Lesion of ulnar nerve, right upper limb: Secondary | ICD-10-CM

## 2014-02-01 DIAGNOSIS — Z7982 Long term (current) use of aspirin: Secondary | ICD-10-CM | POA: Diagnosis not present

## 2014-02-01 LAB — I-STAT CHEM 8, ED
BUN: 21 mg/dL (ref 6–23)
CHLORIDE: 99 meq/L (ref 96–112)
Calcium, Ion: 1.23 mmol/L (ref 1.13–1.30)
Creatinine, Ser: 0.8 mg/dL (ref 0.50–1.35)
Glucose, Bld: 86 mg/dL (ref 70–99)
HCT: 45 % (ref 39.0–52.0)
Hemoglobin: 15.3 g/dL (ref 13.0–17.0)
Potassium: 5.6 mmol/L — ABNORMAL HIGH (ref 3.5–5.1)
Sodium: 138 mmol/L (ref 135–145)
TCO2: 30 mmol/L (ref 0–100)

## 2014-02-01 LAB — URINALYSIS, ROUTINE W REFLEX MICROSCOPIC
Bilirubin Urine: NEGATIVE
Glucose, UA: NEGATIVE mg/dL
Hgb urine dipstick: NEGATIVE
Ketones, ur: NEGATIVE mg/dL
LEUKOCYTES UA: NEGATIVE
Nitrite: NEGATIVE
PH: 7 (ref 5.0–8.0)
Protein, ur: NEGATIVE mg/dL
SPECIFIC GRAVITY, URINE: 1.024 (ref 1.005–1.030)
UROBILINOGEN UA: 1 mg/dL (ref 0.0–1.0)

## 2014-02-01 LAB — COMPREHENSIVE METABOLIC PANEL
ALK PHOS: 74 U/L (ref 39–117)
ALT: 12 U/L (ref 0–53)
AST: 18 U/L (ref 0–37)
Albumin: 3.3 g/dL — ABNORMAL LOW (ref 3.5–5.2)
Anion gap: 11 (ref 5–15)
BUN: 13 mg/dL (ref 6–23)
CALCIUM: 9.9 mg/dL (ref 8.4–10.5)
CHLORIDE: 99 meq/L (ref 96–112)
CO2: 28 mmol/L (ref 19–32)
Creatinine, Ser: 0.78 mg/dL (ref 0.50–1.35)
GFR, EST NON AFRICAN AMERICAN: 85 mL/min — AB (ref >90)
Glucose, Bld: 77 mg/dL (ref 70–99)
Potassium: 3.9 mmol/L (ref 3.5–5.1)
SODIUM: 138 mmol/L (ref 135–145)
Total Bilirubin: 0.3 mg/dL (ref 0.3–1.2)
Total Protein: 8.1 g/dL (ref 6.0–8.3)

## 2014-02-01 LAB — CBC WITH DIFFERENTIAL/PLATELET
Basophils Absolute: 0 10*3/uL (ref 0.0–0.1)
Basophils Relative: 1 % (ref 0–1)
Eosinophils Absolute: 0.1 10*3/uL (ref 0.0–0.7)
Eosinophils Relative: 2 % (ref 0–5)
HCT: 38.3 % — ABNORMAL LOW (ref 39.0–52.0)
Hemoglobin: 12.5 g/dL — ABNORMAL LOW (ref 13.0–17.0)
LYMPHS ABS: 1.5 10*3/uL (ref 0.7–4.0)
LYMPHS PCT: 25 % (ref 12–46)
MCH: 26.7 pg (ref 26.0–34.0)
MCHC: 32.6 g/dL (ref 30.0–36.0)
MCV: 81.7 fL (ref 78.0–100.0)
Monocytes Absolute: 0.5 10*3/uL (ref 0.1–1.0)
Monocytes Relative: 8 % (ref 3–12)
NEUTROS PCT: 64 % (ref 43–77)
Neutro Abs: 3.9 10*3/uL (ref 1.7–7.7)
PLATELETS: 258 10*3/uL (ref 150–400)
RBC: 4.69 MIL/uL (ref 4.22–5.81)
RDW: 15.2 % (ref 11.5–15.5)
WBC: 5.9 10*3/uL (ref 4.0–10.5)

## 2014-02-01 LAB — RAPID URINE DRUG SCREEN, HOSP PERFORMED
AMPHETAMINES: NOT DETECTED
Barbiturates: NOT DETECTED
Benzodiazepines: NOT DETECTED
COCAINE: NOT DETECTED
OPIATES: NOT DETECTED
Tetrahydrocannabinol: NOT DETECTED

## 2014-02-01 LAB — ETHANOL

## 2014-02-01 LAB — BRAIN NATRIURETIC PEPTIDE: B NATRIURETIC PEPTIDE 5: 17.9 pg/mL (ref 0.0–100.0)

## 2014-02-01 LAB — APTT: aPTT: 33 seconds (ref 24–37)

## 2014-02-01 LAB — PROTIME-INR
INR: 0.97 (ref 0.00–1.49)
PROTHROMBIN TIME: 12.9 s (ref 11.6–15.2)

## 2014-02-01 LAB — I-STAT TROPONIN, ED: TROPONIN I, POC: 0 ng/mL (ref 0.00–0.08)

## 2014-02-01 MED ORDER — STROKE: EARLY STAGES OF RECOVERY BOOK
Freq: Once | Status: DC
  Filled 2014-02-01: qty 1

## 2014-02-01 MED ORDER — ASPIRIN 81 MG PO CHEW
324.0000 mg | CHEWABLE_TABLET | Freq: Once | ORAL | Status: AC
  Administered 2014-02-01: 324 mg via ORAL
  Filled 2014-02-01: qty 4

## 2014-02-01 MED ORDER — ENOXAPARIN SODIUM 40 MG/0.4ML ~~LOC~~ SOLN
40.0000 mg | SUBCUTANEOUS | Status: DC
  Filled 2014-02-01: qty 0.4

## 2014-02-01 MED ORDER — SENNOSIDES-DOCUSATE SODIUM 8.6-50 MG PO TABS
1.0000 | ORAL_TABLET | Freq: Every evening | ORAL | Status: DC | PRN
Start: 2014-02-01 — End: 2014-02-02

## 2014-02-01 MED ORDER — TRAMADOL HCL 50 MG PO TABS: 50.0000 mg | ORAL_TABLET | Freq: Four times a day (QID) | ORAL | Status: DC | PRN

## 2014-02-01 MED ORDER — ASPIRIN 300 MG RE SUPP
300.0000 mg | Freq: Every day | RECTAL | Status: DC
Start: 2014-02-01 — End: 2014-02-02

## 2014-02-01 MED ORDER — ATORVASTATIN CALCIUM 10 MG PO TABS
10.0000 mg | ORAL_TABLET | Freq: Every day | ORAL | Status: DC
  Administered 2014-02-01: 10 mg via ORAL
  Filled 2014-02-01: qty 1

## 2014-02-01 MED ORDER — DONEPEZIL HCL 5 MG PO TABS
5.0000 mg | ORAL_TABLET | Freq: Every morning | ORAL | Status: DC
  Administered 2014-02-02: 5 mg via ORAL
  Filled 2014-02-01: qty 1

## 2014-02-01 MED ORDER — SODIUM POLYSTYRENE SULFONATE 15 GM/60ML PO SUSP
30.0000 g | Freq: Once | ORAL | Status: AC
  Administered 2014-02-01: 30 g via ORAL
  Filled 2014-02-01: qty 120

## 2014-02-01 MED ORDER — ASPIRIN 325 MG PO TABS
325.0000 mg | ORAL_TABLET | Freq: Every day | ORAL | Status: DC
  Administered 2014-02-02: 325 mg via ORAL
  Filled 2014-02-01: qty 1

## 2014-02-01 MED ORDER — SODIUM CHLORIDE 0.9 % IV SOLN
INTRAVENOUS | Status: DC
  Administered 2014-02-01 (×2): via INTRAVENOUS

## 2014-02-01 NOTE — H&P (Signed)
PATIENT DETAILS Name: Johnathan Mitchell Age: 80 y.o. Sex: male Date of Birth: [birth year] Admit Date: 02/01/2014 ZOX:[id number]:PROVIDER NOT IN SYSTEM  CHIEF COMPLAINT:  Right-sided weakness ongoing for the past 2 weeks  HPI: Johnathan BeardsJames C Turnley is a 80 y.o. male with a Past Medical History of prior CVA with right-sided residual weakness-wheelchair-bound, hypertension who presents today with the above noted complaint. Apparently over the past 2 weeks, patient has had worsening weakness of his right hand/right upper extremity, and this has progressively worsened over the past few days. He apparently also has had new difficulty with speech and word finding. Neurology was subsequently consulted, current recommendations are to get her MRI, and admit patient for further evaluation and treatment. There is no history of headache, fever, nausea, vomiting or diarrhea. No abdominal pain.   ALLERGIES:   Allergies  Allergen Reactions  . Penicillins Hives and Swelling    PAST MEDICAL HISTORY: Past Medical History  Diagnosis Date  . Hypertension   . Asthma   . Seizures   . Stroke     PAST SURGICAL HISTORY: Past Surgical History  Procedure Laterality Date  . Knee surgery    . Back surgery      MEDICATIONS AT HOME: Prior to Admission medications   Medication Sig Start Date End Date Taking? Authorizing Provider  atorvastatin (LIPITOR) 20 MG tablet Take 10 mg by mouth at bedtime.    Historical Provider, MD  donepezil (ARICEPT) 10 MG tablet Take 5 mg by mouth every morning.    Historical Provider, MD  etodolac (LODINE) 200 MG capsule Take 200 mg by mouth daily as needed for moderate pain.     Historical Provider, MD  lisinopril (PRINIVIL,ZESTRIL) 5 MG tablet Take 2.5 mg by mouth daily.    Historical Provider, MD  naproxen sodium (ANAPROX) 220 MG tablet Take 440 mg by mouth daily as needed (for pain).    Historical Provider, MD  traMADol (ULTRAM) 50 MG tablet Take 1 tablet (50 mg total) by mouth every 6  (six) hours as needed. 06/25/13   Elwin MochaBlair Walden, MD    FAMILY HISTORY: Family History  Problem Relation Age of Onset  . Hyperlipidemia Mother   . Hypertension Mother     SOCIAL HISTORY:  reports that he has quit smoking. He does not have any smokeless tobacco history on file. He reports that he does not drink alcohol or use illicit drugs.  REVIEW OF SYSTEMS:  Constitutional:   No  weight loss, night sweats,  Fevers, chills, fatigue.  HEENT:    No headaches, Difficulty swallowing,Tooth/dental problems,Sore throat.   Cardio-vascular: No chest pain,  Orthopnea, PND, swelling in lower extremities, anasarca, dizziness, palpitations  GI:  No heartburn, indigestion, abdominal pain, nausea, vomiting, diarrhea, change in  bowel habits, loss of appetite  Resp: No shortness of breath with exertion or at rest.  No excess mucus, no productive cough, No non-productive cough  Skin:  no rash or lesions.  GU:  no dysuria, change in color of urine, no urgency or frequency.  No flank pain.  Musculoskeletal: No joint pain or swelling.  No decreased range of motion.  No back pain.  Psych: No change in mood or affect. No depression or anxiety.  No memory loss.   PHYSICAL EXAM: Blood pressure 113/74, pulse 64, temperature 98 F (36.7 C), temperature source Oral, resp. rate 12, SpO2 99 %.  General appearance :Awake, alert, not in any distress. Speech Clear. Not toxic Looking HEENT:  Atraumatic and Normocephalic, pupils equally reactive to light and accomodation Neck: supple, no JVD. No cervical lymphadenopathy.  Chest:Good air entry bilaterally, no added sounds  CVS: S1 S2 regular, no murmurs.  Abdomen: Bowel sounds present, Non tender and not distended with no gaurding, rigidity or rebound. Extremities: B/L Lower Ext shows no edema, both legs are warm to touch Neurology: Right upper approximately 4/5.Right lower extremity 3-4/5 Skin:No Rash Wounds:N/A  LABS ON ADMISSION:   Recent  Labs  02/01/14 1219  NA 138  K 5.6*  CL 99  GLUCOSE 86  BUN 21  CREATININE 0.80   No results for input(s): AST, ALT, ALKPHOS, BILITOT, PROT, ALBUMIN in the last 72 hours. No results for input(s): LIPASE, AMYLASE in the last 72 hours.  Recent Labs  02/01/14 1211 02/01/14 1219  WBC 5.9  --   NEUTROABS 3.9  --   HGB 12.5* 15.3  HCT 38.3* 45.0  MCV 81.7  --   PLT 258  --    No results for input(s): CKTOTAL, CKMB, CKMBINDEX, TROPONINI in the last 72 hours. No results for input(s): DDIMER in the last 72 hours. Invalid input(s): POCBNP   RADIOLOGIC STUDIES ON ADMISSION: Ct Head Wo Contrast  02/01/2014   CLINICAL DATA:  80 year old male with right side weakness x2 weeks. Abnormal speech. Initial encounter.  EXAM: CT HEAD WITHOUT CONTRAST  TECHNIQUE: Contiguous axial images were obtained from the base of the skull through the vertex without intravenous contrast.  COMPARISON:  None.  FINDINGS: Advanced degenerative changes in the visible upper cervical spine. Chronic left lamina papyracea fracture. Visualized paranasal sinuses and mastoids are clear. No acute osseous abnormality identified.  No acute orbit or scalp soft tissue findings.  Dural calcifications. No midline shift, mass effect, or evidence of intracranial mass lesion. Intracranial artery dolichoectasia and calcified plaque. Volume loss and encephalomalacia in the left hemisphere appears related to multiple lacunar type infarcts. There is Wallerian degeneration evident at the midbrain. Ex vacuo enlargement of the left lateral ventricle. No ventriculomegaly. No definite superimposed acute cortically based infarct. Patchy white matter hypodensity in the right hemisphere. No suspicious intracranial vascular hyperdensity. No acute intracranial hemorrhage identified.  IMPRESSION: Advanced chronic ischemic changes in the left hemisphere. No acute intracranial abnormality identified.   Electronically Signed   By: Augusto Gamble M.D.   On:  02/01/2014 13:10   Dg Knee Complete 4 Views Left  02/01/2014   CLINICAL DATA:  Chronic left knee pain. No known injury. Initial evaluation  EXAM: LEFT KNEE - COMPLETE 4+ VIEW  COMPARISON:  None.  FINDINGS: Severe tricompartment degenerative change. Extensive osteophyte formation is present. Chondrocalcinosis, most likely degenerative is present. Prominent loose bodies. No acute fracture. No dislocation. Small knee joint effusion cannot be excluded.  IMPRESSION: Extensive tricompartment degenerative change.   Electronically Signed   By: Maisie Fus  Register   On: 02/01/2014 13:29     EKG: Independently reviewed. Normal sinus rhythm  ASSESSMENT AND PLAN: Present on Admission:  . ? CVA (cerebral infarction): Given prior history of CVA, will check a MRI brain to assess further. Per neurology, commence CVA workup if MRI is positive. Get PT/OT eval. Await further input from neurology.  . Hyperkalemia: 1 dose of Kayexalate today, recheck electrolytes in a.m.   . Benign essential HTN: Temporarily discontinue lisinopril-allow permissive hypertension. Follow.  . History of seizures: Currently seizure free, not on any antiepileptics. Monitor.  Further plan will depend as patient's clinical course evolves and further radiologic and laboratory data become available.  Patient will be monitored closely.    DVT Prophylaxis: Prophylactic Lovenox   Code Status: Full Code  Disposition Plan: Home with home health services    Total time spent for admission equals 45 minutes.  [facility] Pager [phone]  If 7PM-7AM, please contact night-coverage [email or web address] Password [email or web address] 02/01/2014, 3:58 PM

## 2014-02-01 NOTE — ED Notes (Addendum)
Pt comes via EMS from home, reports Rt sided weakness beginning 2 weeks ago, reports some delay with speech. Pt reports hx of stroke, approx. 1995, now uses a wheelchair.   EMS reports VS WDL, CBG 116.  Rt sided facial droop, right sided deficits noted RUE, RLE.   Pt report L knee pain, swelling noted.  NAD noted at this time.

## 2014-02-01 NOTE — ED Notes (Signed)
Attempted to call daughter, wrong number

## 2014-02-01 NOTE — Consult Note (Signed)
NEURO HOSPITALIST CONSULT NOTE    Reason for Consult right small and ring finger weakness  HPI:                                                                                                                                          Johnathan Mitchell is an 80 y.o. male with previous stroke in the past that caused right arm and leg weakness. Over the past two weeks he has noticed his right small and ring finger has become less sensitive and weak.  This has progressively become worse over the last few weeks.  For this reason he was brought to ED. On consultation patient states his speech has been off since he has lost multiple teeth and his right LE weakness has been present since his previous stroke many years prior.   Past Medical History  Diagnosis Date  . Hypertension   . Asthma   . Seizures   . Stroke     Past Surgical History  Procedure Laterality Date  . Knee surgery    . Back surgery      Family History  Problem Relation Age of Onset  . Hyperlipidemia Mother   . Hypertension Mother      Social History:  reports that he has quit smoking. He does not have any smokeless tobacco history on file. He reports that he does not drink alcohol or use illicit drugs.  Allergies  Allergen Reactions  . Penicillins Hives and Swelling    MEDICATIONS:                                                                                                                     No current facility-administered medications for this encounter.   Current Outpatient Prescriptions  Medication Sig Dispense Refill  . atorvastatin (LIPITOR) 20 MG tablet Take 10 mg by mouth at bedtime.    . donepezil (ARICEPT) 10 MG tablet Take 5 mg by mouth every morning.    . etodolac (LODINE) 200 MG capsule Take 200 mg by mouth daily as needed for moderate pain.     Marland Kitchen lisinopril (PRINIVIL,ZESTRIL) 5 MG tablet Take 2.5 mg by mouth daily.    . naproxen sodium (ANAPROX) 220 MG tablet Take 440 mg by mouth  daily as  needed (for pain).    . traMADol (ULTRAM) 50 MG tablet Take 1 tablet (50 mg total) by mouth every 6 (six) hours as needed. 30 tablet 0      ROS:                                                                                                                                       History obtained from the patient  General ROS: negative for - chills, fatigue, fever, night sweats, weight gain or weight loss Psychological ROS: negative for - behavioral disorder, hallucinations, memory difficulties, mood swings or suicidal ideation Ophthalmic ROS: negative for - blurry vision, double vision, eye pain or loss of vision ENT ROS: negative for - epistaxis, nasal discharge, oral lesions, sore throat, tinnitus or vertigo Allergy and Immunology ROS: negative for - hives or itchy/watery eyes Hematological and Lymphatic ROS: negative for - bleeding problems, bruising or swollen lymph nodes Endocrine ROS: negative for - galactorrhea, hair pattern changes, polydipsia/polyuria or temperature intolerance Respiratory ROS: negative for - cough, hemoptysis, shortness of breath or wheezing Cardiovascular ROS: negative for - chest pain, dyspnea on exertion, edema or irregular heartbeat Gastrointestinal ROS: negative for - abdominal pain, diarrhea, hematemesis, nausea/vomiting or stool incontinence Genito-Urinary ROS: negative for - dysuria, hematuria, incontinence or urinary frequency/urgency Musculoskeletal ROS: negative for - joint swelling or muscular weakness Neurological ROS: as noted in HPI Dermatological ROS: negative for rash and skin lesion changes   Blood pressure 129/89, pulse 78, temperature 98 F (36.7 C), temperature source Oral, resp. rate 11, SpO2 100 %.   Neurologic Examination:                                                                                                      HEENT-  Normocephalic, no lesions, without obvious abnormality.  Normal external eye and conjunctiva.   Normal TM's bilaterally.  Normal auditory canals and external ears. Normal external nose, mucus membranes and septum.  Normal pharynx. Cardiovascular- regular rate and rhythm, S1, S2 normal, no murmur, click, rub or gallop, pulses palpable throughout   Lungs- chest clear, no wheezing, rales, normal symmetric air entry Abdomen- normal findings: bowel sounds normal Extremities- less then 2 second capillary refill Lymph-no adenopathy palpable Musculoskeletal-no muscular tenderness noted Skin-warm and dry, no hyperpigmentation, vitiligo, or suspicious lesions  Neurological Examination Mental Status: Alert, oriented, thought content appropriate.  Speech slightly dysarthric secondary to lack of dentation without evidence of aphasia.  Able to follow 3 step commands without difficulty.  Cranial Nerves: II: Discs flat bilaterally; Visual fields grossly normal, pupils equal, round, reactive to light and accommodation III,IV, VI: ptosis not present, extra-ocular motions intact bilaterally V,VII: smile symmetric, facial light touch sensation normal bilaterally VIII: hearing normal bilaterally IX,X: gag reflex present XI: bilateral shoulder shrug XII: midline tongue extension Motor: Right : Upper extremity   5/5    Left:     Upper extremity   5/5  Lower extremity   4/5     Lower extremity   4/5 --LE strength limited by pain.  --weakness of right ring and small finger flexion, extension, abduction and adduction.  Tone and bulk:normal tone throughout; no atrophy noted Sensory: Pinprick and light touch intact throughout, bilaterally--decreased sensation in the C8 distribution and abnormal sensation able to be reproduced when Ulnar nerve manipulated.  Deep Tendon Reflexes: 2+ and symmetric throughout UE and no KJ or AJ Plantars: Right: downgoing   Left: downgoing Cerebellar: normal finger-to-nose, unable to obtain  heel-to-shin due to pain  Gait: not tested due to multiple leads.       Lab  Results: Basic Metabolic Panel:  Recent Labs Lab 02/01/14 1219  NA 138  K 5.6*  CL 99  GLUCOSE 86  BUN 21  CREATININE 0.80    Liver Function Tests: No results for input(s): AST, ALT, ALKPHOS, BILITOT, PROT, ALBUMIN in the last 168 hours. No results for input(s): LIPASE, AMYLASE in the last 168 hours. No results for input(s): AMMONIA in the last 168 hours.  CBC:  Recent Labs Lab 02/01/14 1211 02/01/14 1219  WBC 5.9  --   NEUTROABS 3.9  --   HGB 12.5* 15.3  HCT 38.3* 45.0  MCV 81.7  --   PLT 258  --     Cardiac Enzymes: No results for input(s): CKTOTAL, CKMB, CKMBINDEX, TROPONINI in the last 168 hours.  Lipid Panel: No results for input(s): CHOL, TRIG, HDL, CHOLHDL, VLDL, LDLCALC in the last 168 hours.  CBG: No results for input(s): GLUCAP in the last 168 hours.  Microbiology: No results found for this or any previous visit.  Coagulation Studies:  Recent Labs  02/01/14 1211  LABPROT 12.9  INR 0.97    Imaging: Ct Head Wo Contrast  02/01/2014   CLINICAL DATA:  [age] year old male with right side weakness x2 weeks. Abnormal speech. Initial encounter.  EXAM: CT HEAD WITHOUT CONTRAST  TECHNIQUE: Contiguous axial images were obtained from the base of the skull through the vertex without intravenous contrast.  COMPARISON:  None.  FINDINGS: Advanced degenerative changes in the visible upper cervical spine. Chronic left lamina papyracea fracture. Visualized paranasal sinuses and mastoids are clear. No acute osseous abnormality identified.  No acute orbit or scalp soft tissue findings.  Dural calcifications. No midline shift, mass effect, or evidence of intracranial mass lesion. Intracranial artery dolichoectasia and calcified plaque. Volume loss and encephalomalacia in the left hemisphere appears related to multiple lacunar type infarcts. There is Wallerian degeneration evident at the midbrain. Ex vacuo enlargement of the left lateral ventricle. No ventriculomegaly. No  definite superimposed acute cortically based infarct. Patchy white matter hypodensity in the right hemisphere. No suspicious intracranial vascular hyperdensity. No acute intracranial hemorrhage identified.  IMPRESSION: Advanced chronic ischemic changes in the left hemisphere. No acute intracranial abnormality identified.   Electronically Signed   By: Augusto GambleLee  Hall M.D.   On: 02/01/2014 13:10   Dg Knee Complete 4 Views Left  02/01/2014   CLINICAL DATA:  Chronic left knee pain. No known injury. Initial evaluation  EXAM: LEFT KNEE - COMPLETE 4+ VIEW  COMPARISON:  None.  FINDINGS: Severe tricompartment degenerative change. Extensive osteophyte formation is present. Chondrocalcinosis, most likely degenerative is present. Prominent loose bodies. No acute fracture. No dislocation. Small knee joint effusion cannot be excluded.  IMPRESSION: Extensive tricompartment degenerative change.   Electronically Signed   By: Maisie Fus  Register   On: 02/01/2014 13:29       Assessment and plan per attending neurologist  Johnathan Morn PA-C [facility] [phone]  02/01/2014, 2:13 PM   Assessment/Plan:  80 YO male with history of prior CVA, HTN, seizures presenting with 2+ weeks of right small and index finger decreased sensation and weakness. Exam shows weakness and sensation abnormalities to be in the ulnar nerve/C8 distribution below the elbow. 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    Recommend: 1) MRI brain to evaluate for CVA (due to concerns of speech difficulty prior to our consultation)--if negative no further stroke work up necessary.  2) If MRI is negative would have patient follow up with out-patient neurology for EMG/NCV    Johnathan Cho, DO [facility] [phone]  If 7pm- 7am, please page neurology on call as listed in AMION.

## 2014-02-01 NOTE — ED Notes (Addendum)
Pt's daughter Royston CowperJamie Garison called.  Please keep her updated. [phone]

## 2014-02-01 NOTE — ED Provider Notes (Signed)
CSN: [id number]     Arrival date & time 02/01/14  1119 History   First MD Initiated Contact with Patient 02/01/14 1135     Chief Complaint  Patient presents with  . Weakness     (Consider location/radiation/quality/duration/timing/severity/associated sxs/prior Treatment) Patient is a 80 y.o. male presenting with neurologic complaint. The history is provided by the patient.  Neurologic Problem This is a new problem. Episode onset: 2 weeks. The problem occurs constantly. The problem has been gradually worsening. Associated symptoms include arthralgias (left knee) and weakness (right arm and leg). Pertinent negatives include no abdominal pain, chest pain, diaphoresis, fever, headaches, myalgias, nausea, rash, sore throat or vomiting. Associated symptoms comments: Speech problem. Nothing aggravates the symptoms. He has tried nothing for the symptoms. The treatment provided no relief.    Past Medical History  Diagnosis Date  . Hypertension   . Asthma   . Seizures   . Stroke    Past Surgical History  Procedure Laterality Date  . Knee surgery    . Back surgery     Family History  Problem Relation Age of Onset  . Hyperlipidemia Mother   . Hypertension Mother    History  Substance Use Topics  . Smoking status: Former Games developer  . Smokeless tobacco: Not on file  . Alcohol Use: No    Review of Systems  Constitutional: Negative for fever, diaphoresis, activity change and appetite change.  HENT: Negative for facial swelling, sore throat, tinnitus, trouble swallowing and voice change.   Eyes: Negative for pain, redness and visual disturbance.  Respiratory: Negative for chest tightness, shortness of breath and wheezing.   Cardiovascular: Negative for chest pain, palpitations and leg swelling.  Gastrointestinal: Negative for nausea, vomiting, abdominal pain, diarrhea, constipation and abdominal distention.  Endocrine: Negative.   Genitourinary: Negative.  Negative for dysuria, decreased  urine volume, scrotal swelling and testicular pain.  Musculoskeletal: Positive for arthralgias (left knee). Negative for myalgias, back pain and gait problem.  Skin: Negative.  Negative for rash.  Neurological: Positive for facial asymmetry, speech difficulty and weakness (right arm and leg). Negative for dizziness, tremors and headaches.  Psychiatric/Behavioral: Negative for suicidal ideas, hallucinations and self-injury. The patient is not nervous/anxious.       Allergies  Penicillins  Home Medications   Prior to Admission medications   Medication Sig Start Date End Date Taking? Authorizing Provider  atorvastatin (LIPITOR) 20 MG tablet Take 10 mg by mouth at bedtime.    Historical Provider, MD  donepezil (ARICEPT) 10 MG tablet Take 5 mg by mouth every morning.    Historical Provider, MD  etodolac (LODINE) 200 MG capsule Take 200 mg by mouth daily as needed for moderate pain.     Historical Provider, MD  lisinopril (PRINIVIL,ZESTRIL) 5 MG tablet Take 2.5 mg by mouth daily.    Historical Provider, MD  naproxen sodium (ANAPROX) 220 MG tablet Take 440 mg by mouth daily as needed (for pain).    Historical Provider, MD  traMADol (ULTRAM) 50 MG tablet Take 1 tablet (50 mg total) by mouth every 6 (six) hours as needed. 06/25/13   Elwin Mocha, MD   BP 113/74 mmHg  Pulse 64  Temp(Src) 98 F (36.7 C) (Oral)  Resp 12  SpO2 99% Physical Exam  Constitutional: He is oriented to person, place, and time. He appears well-developed and well-nourished. No distress.  HENT:  Head: Normocephalic and atraumatic.  Right Ear: External ear normal.  Left Ear: External ear normal.  Nose: Nose normal.  Mouth/Throat: Oropharynx is clear and moist.  Eyes: Conjunctivae and EOM are normal. Pupils are equal, round, and reactive to light. No scleral icterus.  Neck: Normal range of motion. Neck supple. No JVD present. No tracheal deviation present. No thyromegaly present.  Cardiovascular: Normal rate and intact  distal pulses.  Exam reveals no gallop and no friction rub.   No murmur heard. Pulmonary/Chest: Effort normal and breath sounds normal. No stridor. No respiratory distress. He has no wheezes. He has no rales.  Abdominal: Soft. He exhibits no distension. There is no tenderness. There is no rebound and no guarding.  Musculoskeletal: He exhibits edema and tenderness (left knee tenderness to mild effusion.).  Full passive ROM of all joints including left knee  Neurological: He is alert and oriented to person, place, and time.  Slowed speech with difficulty in word finding.   4+/5 strength in right upper extremity, 3/5 strength in right lower extremity. 5/5 strength in left upper and lower extremity.  Skin: Skin is warm and dry. No rash noted. He is not diaphoretic.  Psychiatric: He has a normal mood and affect. His behavior is normal.  Nursing note and vitals reviewed.   ED Course  Procedures (including critical care time) Labs Review Labs Reviewed  CBC WITH DIFFERENTIAL - Abnormal; Notable for the following:    Hemoglobin 12.5 (*)    HCT 38.3 (*)    All other components within normal limits  I-STAT CHEM 8, ED - Abnormal; Notable for the following:    Potassium 5.6 (*)    All other components within normal limits  PROTIME-INR  APTT  URINE RAPID DRUG SCREEN (HOSP PERFORMED)  URINALYSIS, ROUTINE W REFLEX MICROSCOPIC  COMPREHENSIVE METABOLIC PANEL  BRAIN NATRIURETIC PEPTIDE  ETHANOL  I-STAT TROPOININ, ED    Imaging Review Ct Head Wo Contrast  02/01/2014   CLINICAL DATA:  80 year old male with right side weakness x2 weeks. Abnormal speech. Initial encounter.  EXAM: CT HEAD WITHOUT CONTRAST  TECHNIQUE: Contiguous axial images were obtained from the base of the skull through the vertex without intravenous contrast.  COMPARISON:  None.  FINDINGS: Advanced degenerative changes in the visible upper cervical spine. Chronic left lamina papyracea fracture. Visualized paranasal sinuses and  mastoids are clear. No acute osseous abnormality identified.  No acute orbit or scalp soft tissue findings.  Dural calcifications. No midline shift, mass effect, or evidence of intracranial mass lesion. Intracranial artery dolichoectasia and calcified plaque. Volume loss and encephalomalacia in the left hemisphere appears related to multiple lacunar type infarcts. There is Wallerian degeneration evident at the midbrain. Ex vacuo enlargement of the left lateral ventricle. No ventriculomegaly. No definite superimposed acute cortically based infarct. Patchy white matter hypodensity in the right hemisphere. No suspicious intracranial vascular hyperdensity. No acute intracranial hemorrhage identified.  IMPRESSION: Advanced chronic ischemic changes in the left hemisphere. No acute intracranial abnormality identified.   Electronically Signed   By: Augusto GambleLee  Hall M.D.   On: 02/01/2014 13:10   Dg Knee Complete 4 Views Left  02/01/2014   CLINICAL DATA:  Chronic left knee pain. No known injury. Initial evaluation  EXAM: LEFT KNEE - COMPLETE 4+ VIEW  COMPARISON:  None.  FINDINGS: Severe tricompartment degenerative change. Extensive osteophyte formation is present. Chondrocalcinosis, most likely degenerative is present. Prominent loose bodies. No acute fracture. No dislocation. Small knee joint effusion cannot be excluded.  IMPRESSION: Extensive tricompartment degenerative change.   Electronically Signed   By: Maisie Fushomas  Register   On: 02/01/2014 13:29  EKG Interpretation   Date/Time:  Tuesday February 01 2014 11:29:51 EST Ventricular Rate:  84 PR Interval:  186 QRS Duration: 76 QT Interval:  357 QTC Calculation: 422 R Axis:   68 Text Interpretation:  Sinus rhythm Abnormal R-wave progression, early  transition No significant change was found Confirmed by Manus Gunning  MD,  STEPHEN [phone]) on 02/01/2014 11:34:48 AM      MDM   Final diagnoses:  Stroke  Knee pain, acute, left  Weakness on right side of face    The  patient is a 80 year old male with remote history of stroke in the 90s with resultant right-sided weakness which is causing to be wheelchair-bound who presents with 2 weeks of increasing right sided weakness and difficulty with word finding. Patient denies any falls or other trauma or headaches. Patient is afebrile, vital signs stable exam as above. I-STAT Chem-8 shows elevated potassium. No EKG changes and patient denies any chest pain or palpitations or cardiac symptoms. Will not treat possible hyperkalemia at this time but will draw a formal complete metabolic panel for further evaluation.   Head CT unremarkable. Neurology's consultation and evaluates the patient in the emergency room. Please see their note for more detail. MRI brain ordered patient admitted for further evaluation.   Patient seen with attending, Dr. Manus Gunning, who oversaw clinical decision making.     Lula Olszewski, MD 02/01/14 1513  Glynn Octave, MD 02/01/14 [phone]

## 2014-02-02 DIAGNOSIS — R531 Weakness: Secondary | ICD-10-CM

## 2014-02-02 LAB — BASIC METABOLIC PANEL
ANION GAP: 10 (ref 5–15)
BUN: 11 mg/dL (ref 6–23)
CO2: 24 mmol/L (ref 19–32)
Calcium: 9 mg/dL (ref 8.4–10.5)
Chloride: 104 mEq/L (ref 96–112)
Creatinine, Ser: 0.81 mg/dL (ref 0.50–1.35)
GFR, EST NON AFRICAN AMERICAN: 84 mL/min — AB (ref >90)
GLUCOSE: 112 mg/dL — AB (ref 70–99)
POTASSIUM: 3.4 mmol/L — AB (ref 3.5–5.1)
Sodium: 138 mmol/L (ref 135–145)

## 2014-02-02 LAB — LIPID PANEL
CHOL/HDL RATIO: 3.5 ratio
Cholesterol: 221 mg/dL — ABNORMAL HIGH (ref 0–200)
HDL: 63 mg/dL (ref >39)
LDL Cholesterol: 140 mg/dL — ABNORMAL HIGH (ref 0–99)
Triglycerides: 88 mg/dL (ref <150)
VLDL: 18 mg/dL (ref 0–40)

## 2014-02-02 LAB — HEMOGLOBIN A1C
Hgb A1c MFr Bld: 5.8 % — ABNORMAL HIGH (ref <5.7)
Mean Plasma Glucose: 120 mg/dL — ABNORMAL HIGH (ref <117)

## 2014-02-02 MED ORDER — ASPIRIN 81 MG PO TABS: 81.0000 mg | ORAL_TABLET | Freq: Every day | ORAL | Status: DC

## 2014-02-02 NOTE — Progress Notes (Signed)
Talked to patient about HHC choices, [facility]; [facility]; Alexis GoodellB Rivers Gassmann RN,BSN,MHA [phone]

## 2014-02-02 NOTE — Evaluation (Signed)
Occupational Therapy Evaluation Patient Details Name: Johnathan Mitchell MRN: [id number] DOB: 18-Nov-1934 Today's Date: 02/02/2014    History of Present Illness  80 y.o. male presents with worsening weakness of his right hand/right upper extremity, and this has progressively worsened over the past few days. He apparently also has had new difficulty with speech and word finding. MRI neg for acute findings. X-ray of knee showed many osteophytes and arthritic changes but was neg for fx. PMH: CVA with right-sided residual weakness-wheelchair-bound, hypertension    Clinical Impression   Patient evaluated by Occupational Therapy with no further acute OT needs identified. All education has been completed and the patient has no further questions. See below for any follow-up Occupational Therapy or equipment needs. OT to sign off. Thank you for referral.      Follow Up Recommendations  No OT follow up    Equipment Recommendations  None recommended by OT    Recommendations for Other Services       Precautions / Restrictions Precautions Precautions: Fall Restrictions Weight Bearing Restrictions: No      Mobility Bed Mobility               General bed mobility comments: in chair on arrival (see PT eval)  Transfers Overall transfer level: Needs assistance   Transfers: Lateral/Scoot Transfers          Lateral/Scoot Transfers: Min guard General transfer comment: completed with therapist helping setup environment to simulate home setup    Balance                                            ADL Overall ADL's : At baseline                                       General ADL Comments: completed chair to 3n1 transfer and back. pt demonstrates ability to complete toilet transfer     Vision                     Perception     Praxis      Pertinent Vitals/Pain Pain Assessment: Faces Faces Pain Scale: Hurts little more Pain Location:  right knee with extension Pain Intervention(s): Monitored during session;Repositioned     Hand Dominance Right   Extremity/Trunk Assessment Upper Extremity Assessment Upper Extremity Assessment: Generalized weakness;RUE deficits/detail RUE Deficits / Details: pt reports inability to make a fist and only fleixon of first second and third digit. But when completing functional task pt able to use a full fist and flex all digits   Lower Extremity Assessment Lower Extremity Assessment: Defer to PT evaluation   Cervical / Trunk Assessment Cervical / Trunk Assessment: Normal   Communication Communication Communication: Expressive difficulties   Cognition Arousal/Alertness: Awake/alert Behavior During Therapy: WFL for tasks assessed/performed Overall Cognitive Status: No family/caregiver present to determine baseline cognitive functioning                     General Comments       Exercises       Shoulder Instructions      Home Living Family/patient expects to be discharged to:: Private residence Living Arrangements: Children;Other relatives Available Help at Discharge: Family;Available 24 hours/day Type of Home: House Home Access: Stairs to enter  Home Layout: One level               Home Equipment: Cane - single point;Wheelchair - manual;Bedside commode   Additional Comments: pt demonstrates lateral scooting onto 3n1 for toileting. Pt lives with daughter to assit with bathing      Prior Functioning/Environment Level of Independence: Needs assistance  Gait / Transfers Assistance Needed: transfers only for past year, does this independently ADL's / Homemaking Assistance Needed: family helps him bet a shower once a week Communication / Swallowing Assistance Needed: slurring of speech and word finding issues at baseline, unsure whether this has worsened, see SLP eval (20 years ago when CVA occurred)      OT Diagnosis:     OT Problem List:     OT  Treatment/Interventions:      OT Goals(Current goals can be found in the care plan section)    OT Frequency:     Barriers to D/C:            Co-evaluation              End of Session    Activity Tolerance: Patient tolerated treatment well Patient left: in chair;with call bell/phone within reach;with chair alarm set   Time: [id number] OT Time Calculation (min): 9 min Charges:  OT General Charges $OT Visit: 1 Procedure OT Evaluation $Initial OT Evaluation Tier I: 1 Procedure G-Codes:    Johnathan Mitchell, Johnathan Mitchell B 02/02/2014, 12:56 PM  Pager: [phone]

## 2014-02-02 NOTE — Clinical Social Work Note (Signed)
Clinical Social Worker arranged ambulance transport via [facility] for 2:10PM to patient's home address given by pt's family.   Clinical Social Worker will sign off for now as social work intervention is no longer needed. Please consult us again if new need arises.  Derenda FennelBashira Ariana Juul, MSW, LCSWA [phone] 02/02/2014 2:11 PM

## 2014-02-02 NOTE — Discharge Summary (Signed)
Johnathan Mitchell, 80 y.o., DOB [birth year], MRN [id number]. Admission date: 02/01/2014 Discharge Date 02/02/2014 Primary MD PROVIDER NOT IN SYSTEM Admitting Physician Maretta BeesShanker M Ghimire, MD  Admission Diagnosis  CVA (cerebral infarction) [I63.9] Stroke [I63.9] Knee pain, acute, left [M25.562] Weakness on right side of face [R29.810]  Discharge Diagnosis   Principal Problem:   CVA (cerebral infarction) Active Problems:   Benign essential HTN      Past Medical History  Diagnosis Date  . Hypertension   . Asthma   . Seizures   . Stroke     Past Surgical History  Procedure Laterality Date  . Knee surgery    . Back surgery       Hospital Course See H&P, Labs, Consult and Test reports for all details in brief, patient was admitted for   Principal Problem:   CVA (cerebral infarction) Active Problems:   Benign essential HTN  Johnathan Mitchell is a 80 y.o. male with a Past Medical History of prior CVA with right-sided residual weakness-wheelchair-bound, hypertension who presents  with the above noted complaint. Apparently over the past few weeks, patient has had worsening weakness of his right hand/right upper extremity, and this has progressively worsened over the past few days. He apparently also has had new difficulty with speech and word finding. Neurology was subsequently consulted, who recommended MRI of the brain, which was done and did not show any evidence of acute CVA, only evidence of remote  Hemorrhagic left basal ganglia infarct, and lacunar right basal ganglia infarct. - Will resume back on lisinopril on discharge, continue with statin, start aspirin 81 mg oral daily. - Neurology outpatient follow-up for EMG/NCV Consults   Neurology  Significant Tests:  See full reports for all details    Ct Head Wo Contrast  02/01/2014   CLINICAL DATA:  80 year old male with right side weakness x2 weeks. Abnormal speech. Initial encounter.  EXAM: CT HEAD WITHOUT CONTRAST  TECHNIQUE: Contiguous  axial images were obtained from the base of the skull through the vertex without intravenous contrast.  COMPARISON:  None.  FINDINGS: Advanced degenerative changes in the visible upper cervical spine. Chronic left lamina papyracea fracture. Visualized paranasal sinuses and mastoids are clear. No acute osseous abnormality identified.  No acute orbit or scalp soft tissue findings.  Dural calcifications. No midline shift, mass effect, or evidence of intracranial mass lesion. Intracranial artery dolichoectasia and calcified plaque. Volume loss and encephalomalacia in the left hemisphere appears related to multiple lacunar type infarcts. There is Wallerian degeneration evident at the midbrain. Ex vacuo enlargement of the left lateral ventricle. No ventriculomegaly. No definite superimposed acute cortically based infarct. Patchy white matter hypodensity in the right hemisphere. No suspicious intracranial vascular hyperdensity. No acute intracranial hemorrhage identified.  IMPRESSION: Advanced chronic ischemic changes in the left hemisphere. No acute intracranial abnormality identified.   Electronically Signed   By: Augusto GambleLee  Hall M.D.   On: 02/01/2014 13:10   Mr Brain Wo Contrast  02/01/2014   CLINICAL DATA:  Weakness in the right side of the face. Personal history of infarct resulting and a right upper and lower extremity weakness. Progressive weakness and numbness in the right small and ring fingers. Abnormal speech.  EXAM: MRI HEAD WITHOUT CONTRAST  TECHNIQUE: Multiplanar, multiecho pulse sequences of the brain and surrounding structures were obtained without intravenous contrast.  COMPARISON:  CT head without contrast from the same day.  FINDINGS: A remote hemorrhagic infarct is noted within the left basal ganglia extending into the left coronal  radiata. There is significant asymmetric white matter change on the left. Ex vacuo dilation of the left lateral ventricle and the sylvian fissure is noted. Left-sided wallerian  degeneration extends into the brainstem.  Moderate right-sided periventricular white matter changes are evident as well. Remote lacunar infarcts are present in the right basal ganglia as well.  Flow is present in the major intracranial arteries. A remote left medial orbital blowout fracture is noted. The globes and orbits are otherwise intact. The paranasal sinuses and mastoid air cells are otherwise clear.  IMPRESSION: 1. No acute intracranial abnormality. 2. Remote hemorrhagic infarct involving the left basal ganglia and coronal radiata with wallerian degeneration extending into the brainstem. 3. Extensive right-sided white matter changes are present as well compatible with chronic microvascular ischemia. 4. Remote lacunar infarcts involving the right basal ganglia.   Electronically Signed   By: Gennette Pac M.D.   On: 02/01/2014 16:40   Dg Knee Complete 4 Views Left  02/01/2014   CLINICAL DATA:  Chronic left knee pain. No known injury. Initial evaluation  EXAM: LEFT KNEE - COMPLETE 4+ VIEW  COMPARISON:  None.  FINDINGS: Severe tricompartment degenerative change. Extensive osteophyte formation is present. Chondrocalcinosis, most likely degenerative is present. Prominent loose bodies. No acute fracture. No dislocation. Small knee joint effusion cannot be excluded.  IMPRESSION: Extensive tricompartment degenerative change.   Electronically Signed   By: Maisie Fus  Register   On: 02/01/2014 13:29     Today   Subjective:   Johnathan Mitchell today has no headache,no chest abdominal pain,no new weakness tingling or numbness, fee  Objective:   Blood pressure 109/85, pulse 89, temperature 98.1 F (36.7 C), temperature source Oral, resp. rate 18, SpO2 100 %.  Intake/Output Summary (Last 24 hours) at 02/02/14 1208 Last data filed at 02/02/14 0535  Gross per 24 hour  Intake      0 ml  Output    175 ml  Net   -175 ml    Exam Awake Alert, Oriented *3, No new F.N deficits, Normal affect [place].AT,PERRAL Supple  Neck,No JVD, No cervical lymphadenopathy appriciated.  Symmetrical Chest wall movement, Good air movement bilaterally, CTAB RRR,No Gallops,Rubs or new Murmurs, No Parasternal Heave +ve B.Sounds, Abd Soft, Non tender, No organomegaly appriciated, No rebound -guarding or rigidity. No Cyanosis, Clubbing or edema, No new Rash or bruise, right lower extremity 3-4 out of 5, right upper extremity 4 out of 5.  Data Review   Cultures -  CBC w Diff:  Lab Results  Component Value Date   WBC 5.9 02/01/2014   HGB 15.3 02/01/2014   HCT 45.0 02/01/2014   PLT 258 02/01/2014   LYMPHOPCT 25 02/01/2014   MONOPCT 8 02/01/2014   EOSPCT 2 02/01/2014   BASOPCT 1 02/01/2014   CMP:  Lab Results  Component Value Date   NA 138 02/02/2014   K 3.4* 02/02/2014   CL 104 02/02/2014   CO2 24 02/02/2014   BUN 11 02/02/2014   CREATININE 0.81 02/02/2014   PROT 8.1 02/01/2014   ALBUMIN 3.3* 02/01/2014   BILITOT 0.3 02/01/2014   ALKPHOS 74 02/01/2014   AST 18 02/01/2014   ALT 12 02/01/2014  .  Micro Results No results found for this or any previous visit (from the past 240 hour(s)).   Discharge Instructions          Follow-up Information    Follow up with SUMNER, PETER, JUSTIN, DO. Schedule an appointment as soon as possible for a visit in 1 month.  Specialty:  Neurology   Contact information:   [address] [place] [address] [phone]       Discharge Medications     Medication List    STOP taking these medications        naproxen sodium 220 MG tablet  Commonly known as:  ANAPROX      TAKE these medications        aspirin 81 MG tablet  Take 1 tablet (81 mg total) by mouth daily.     atorvastatin 20 MG tablet  Commonly known as:  LIPITOR  Take 10 mg by mouth at bedtime.     donepezil 10 MG tablet  Commonly known as:  ARICEPT  Take 5 mg by mouth every morning.     etodolac 200 MG capsule  Commonly known as:  LODINE  Take 200 mg by mouth daily as  needed for moderate pain.     lisinopril 5 MG tablet  Commonly known as:  PRINIVIL,ZESTRIL  Take 2.5 mg by mouth daily.     traMADol 50 MG tablet  Commonly known as:  ULTRAM  Take 1 tablet (50 mg total) by mouth every 6 (six) hours as needed.         Total Time in preparing paper work, data evaluation and todays exam - 35 minutes  ELGERGAWY, DAWOOD M.D on 02/02/2014 at 12:08 PM  [facility] Office  [phone]

## 2014-02-02 NOTE — Discharge Instructions (Signed)
Follow with Primary MD PROVIDER NOT IN SYSTEM in 7 days   Get CBC, CMP, 2 view Chest X ray checked  by Primary MD next visit.    Activity: As tolerated with Full fall precautions use walker/cane & assistance as needed   Disposition Home with home care.   Diet: Heart Healthy  , with feeding assistance and aspiration precautions as needed.  For Heart failure patients - Check your Weight same time everyday, if you gain over 2 pounds, or you develop in leg swelling, experience more shortness of breath or chest pain, call your Primary MD immediately. Follow Cardiac Low Salt Diet and 1.8 lit/day fluid restriction.   On your next visit with your primary care physician please Get Medicines reviewed and adjusted.   Please request your Prim.MD to go over all Hospital Tests and Procedure/Radiological results at the follow up, please get all Hospital records sent to your Prim MD by signing hospital release before you go home.   If you experience worsening of your admission symptoms, develop shortness of breath, life threatening emergency, suicidal or homicidal thoughts you must seek medical attention immediately by calling 911 or calling your MD immediately  if symptoms less severe.  You Must read complete instructions/literature along with all the possible adverse reactions/side effects for all the Medicines you take and that have been prescribed to you. Take any new Medicines after you have completely understood and accpet all the possible adverse reactions/side effects.   Do not drive, operating heavy machinery, perform activities at heights, swimming or participation in water activities or provide baby sitting services if your were admitted for syncope or siezures until you have seen by Primary MD or a Neurologist and advised to do so again.  Do not drive when taking Pain medications.    Do not take more than prescribed Pain, Sleep and Anxiety Medications  Special Instructions: If you have  smoked or chewed Tobacco  in the last 2 yrs please stop smoking, stop any regular Alcohol  and or any Recreational drug use.  Wear Seat belts while driving.   Please note  You were cared for by a hospitalist during your hospital stay. If you have any questions about your discharge medications or the care you received while you were in the hospital after you are discharged, you can call the unit and asked to speak with the hospitalist on call if the hospitalist that took care of you is not available. Once you are discharged, your primary care physician will handle any further medical issues. Please note that NO REFILLS for any discharge medications will be authorized once you are discharged, as it is imperative that you return to your primary care physician (or establish a relationship with a primary care physician if you do not have one) for your aftercare needs so that they can reassess your need for medications and monitor your lab values.

## 2014-02-02 NOTE — Evaluation (Signed)
Speech Language Pathology Evaluation Patient Details Name: Johnathan Mitchell MRN: [id number] DOB: 08/02/1934 Today's Date: 02/02/2014 Time: 3086-5784 SLP Time Calculation (min) (ACUTE ONLY): 25 min  Problem List:  Patient Active Problem List   Diagnosis Date Noted  . CVA (cerebral infarction) 02/01/2014  . Benign essential HTN 02/01/2014   Past Medical History:  Past Medical History  Diagnosis Date  . Hypertension   . Asthma   . Seizures   . Stroke    Past Surgical History:  Past Surgical History  Procedure Laterality Date  . Knee surgery    . Back surgery     HPI:  80 yo male adm with increased right sided weakness xfew weeks - per imaging studies pt with no acute findings.   PMH + for previous CVA.  Speech evaluation ordered.    Assessment / Plan / Recommendation Clinical Impression  Pt presents with mild dysarthria and severe cognitive linguistic deficits resulting in confabulation, decreased recall of new information as well as long term memory deficits.  No family present to aid in determining baseline status.  Pt reported he just moved to [place] and therefore does not know his home address, later he states he has lived here for 20 years.   Pt did follow two step commands and recalled some of his medical hx.  He was also oriented to self, place (hospital) and situation (possible CVA).  He however did not recall MD informing him that his MRI was negative within 5 minutes demonstrating impaired short term memory.    SlP will follow up to aid in providing compensation strategies to family for cognitive deficits.  24/7 supervision would be recommended due to level of cognitive impairment.      SLP Assessment  Patient needs continued Speech Lanaguage Pathology Services    Follow Up Recommendations  Skilled Nursing facility    Frequency and Duration min 1 x/week  1 week   Pertinent Vitals/Pain Pain Assessment: No/denies pain   SLP Goals  Patient/Family Stated Goal: none  stated Potential to Achieve Goals (ACUTE ONLY): Fair Potential Considerations (ACUTE ONLY): Other (comment) (? prior level of function)  SLP Evaluation Prior Functioning  Cognitive/Linguistic Baseline: Information not available   Cognition  Overall Cognitive Status: No family/caregiver present to determine baseline cognitive functioning Arousal/Alertness: Awake/alert Orientation Level: Oriented to person;Disoriented to time;Oriented to situation Attention: Sustained Sustained Attention: Impaired Sustained Attention Impairment: Functional complex Memory: Impaired Memory Impairment: Storage deficit;Retrieval deficit;Decreased recall of new information;Decreased long term memory;Decreased short term memory;Prospective memory Decreased Long Term Memory: Verbal basic;Functional basic Decreased Short Term Memory: Verbal basic;Functional basic Awareness: Impaired Awareness Impairment: Intellectual impairment Problem Solving: Impaired Problem Solving Impairment: Verbal basic;Functional basic Behaviors: Confabulation Safety/Judgment: Impaired    Comprehension  Auditory Comprehension Commands: Impaired One Step Basic Commands: 75-100% accurate Two Step Basic Commands: 75-100% accurate Multistep Basic Commands: 25-49% accurate Conversation: Simple Other Conversation Comments: complex conversation more difficult to comprehend Interfering Components: Working memory EffectiveTechniques: Repetition Counsellor: Not tested Reading Comprehension Reading Status: Not tested    Expression Expression Primary Mode of Expression: Verbal Verbal Expression Initiation: No impairment Level of Generative/Spontaneous Verbalization: Conversation Repetition: No impairment Naming: No impairment Written Expression Dominant Hand: Right Written Expression: Not tested   Oral / Motor Oral Motor/Sensory Function Labial ROM: Reduced right Lingual Symmetry: Abnormal  symmetry right Lingual Strength: Reduced Facial ROM: Reduced right Velum: Within Functional Limits Mandible: Within Functional Limits Motor Speech Overall Motor Speech: Impaired Respiration: Impaired Phonation: Normal Articulation: Impaired Level of  Impairment: Phrase Intelligibility: Intelligibility reduced Word: 75-100% accurate Phrase: 75-100% accurate Sentence: 75-100% accurate Conversation: 75-100% accurate Motor Planning: Not tested Motor Speech Errors: Not applicable   GO     Donavan Burnetamara Cicero Noy, MS [facility] SLP [phone]

## 2014-02-02 NOTE — Progress Notes (Signed)
[facility] ambulance service arrived at 1515 to transport patient home. The daughter who had requested the transport, was not at home [facility]. They were unable to transport patient home at that time since daughter was not available. I phoned daughter Johnathan Mitchell(Jamie) to notify her of the situation and left a message for her to respond to us asap as discharge was pending her being available to accept patient at home. He remains her waiting discharge.

## 2014-02-02 NOTE — Evaluation (Signed)
Physical Therapy Evaluation Patient Details Name: Johnathan Mitchell MRN: [id number] DOB: [birth year] Today's Date: 02/02/2014   History of Present Illness   80 y.o. male presents with worsening weakness of his right hand/right upper extremity, and this has progressively worsened over the past few days. He apparently also has had new difficulty with speech and word finding. MRI neg for acute findings. X-ray of knee showed many osteophytes and arthritic changes but was neg for fx. PMH: CVA with right-sided residual weakness-wheelchair-bound, hypertension   Clinical Impression  Pt admitted with above diagnosis. Pt currently with functional limitations due to the deficits listed below (see PT Problem List). Pt with hamstring tightness R>L and chronic LE weakness though no acute weakness noted. Recommend f/u with HHPT for ROM and strengthening of bilateral LE's.  Pt will benefit from skilled PT to increase their independence and safety with mobility to allow discharge to the venue listed below.       Follow Up Recommendations Home health PT;Supervision - Intermittent    Equipment Recommendations  None recommended by PT    Recommendations for Other Services Other (comment) (CSW)     Precautions / Restrictions Precautions Precautions: Fall Restrictions Weight Bearing Restrictions: No      Mobility  Bed Mobility Overal bed mobility: Modified Independent             General bed mobility comments: in chair on arrival (see PT eval)  Transfers Overall transfer level: Needs assistance Equipment used: None Transfers: Lateral/Scoot Transfers     Squat pivot transfers: Min guard    Lateral/Scoot Transfers: Min guard General transfer comment: completed with therapist helping setup environment to simulate home setup  Ambulation/Gait             General Gait Details: pt has not ambulated in a year  Stairs            Wheelchair Mobility    Modified Rankin (Stroke Patients  Only)       Balance Overall balance assessment: No apparent balance deficits (not formally assessed)                                           Pertinent Vitals/Pain Pain Assessment: Faces Faces Pain Scale: Hurts little more Pain Location: right knee with extension Pain Intervention(s): Monitored during session;Repositioned    Home Living Family/patient expects to be discharged to:: Private residence Living Arrangements: Children;Other relatives Available Help at Discharge: Family;Available 24 hours/day Type of Home: House Home Access: Stairs to enter     Home Layout: One level Home Equipment: Cane - single point;Wheelchair - manual;Bedside commode Additional Comments: pt demonstrates lateral scooting onto 3n1 for toileting. Pt lives with daughter to assit with bathing    Prior Function Level of Independence: Needs assistance   Gait / Transfers Assistance Needed: transfers only for past year, does this independently  ADL's / Homemaking Assistance Needed: family helps him bet a shower once a week        Hand Dominance   Dominant Hand: Right    Extremity/Trunk Assessment   Upper Extremity Assessment: Generalized weakness;RUE deficits/detail RUE Deficits / Details: pt reports inability to make a fist and only fleixon of first second and third digit. But when completing functional task pt able to use a full fist and flex all digits         Lower Extremity Assessment: Defer to PT  evaluation RLE Deficits / Details: hamstrings very tight, crepitus noted at knee and hip with knee ROM. Swelling noted right knee. Strength grossly 4/5 LLE Deficits / Details: generalized weakness since CVA  Cervical / Trunk Assessment: Normal  Communication   Communication: Expressive difficulties  Cognition Arousal/Alertness: Awake/alert Behavior During Therapy: WFL for tasks assessed/performed Overall Cognitive Status: No family/caregiver present to determine baseline  cognitive functioning                      General Comments General comments (skin integrity, edema, etc.): pt educated on need for stretching bilateral LE's due to time spent in seated position. Given hamstring stretching activities, eduacted on proper positioning, would benefit from HHPT for continued strengthening and ROM of LE's    Exercises        Assessment/Plan    PT Assessment Patient needs continued PT services  PT Diagnosis Generalized weakness   PT Problem List Decreased strength;Decreased range of motion;Decreased mobility;Decreased knowledge of use of DME;Decreased knowledge of precautions  PT Treatment Interventions DME instruction;Gait training;Functional mobility training;Therapeutic activities;Therapeutic exercise;Patient/family education   PT Goals (Current goals can be found in the Care Plan section) Acute Rehab PT Goals Patient Stated Goal: get a ramp for home accessibility PT Goal Formulation: With patient Time For Goal Achievement: 02/16/14 Potential to Achieve Goals: Good    Frequency Min 3X/week   Barriers to discharge        Co-evaluation               End of Session Equipment Utilized During Treatment: Gait belt Activity Tolerance: Patient tolerated treatment well Patient left: in chair;with call bell/phone within reach Nurse Communication: Mobility status         Time: [id number] PT Time Calculation (min) (ACUTE ONLY): 17 min   Charges:   PT Evaluation $Initial PT Evaluation Tier I: 1 Procedure PT Treatments $Therapeutic Activity: 8-22 mins   PT G Codes:      Lyanne CoVictoria Arriona Prest, PT  Acute Rehab Services  [phone]   [place], [place] 02/02/2014, 1:01 PM

## 2014-02-03 NOTE — Progress Notes (Signed)
Daughter phoned back at 1830 stating that she was at home and available to care for patient. [facility] contacted and arrived to transport patient. He was discharged via stretcher at 1930. Daughter had been given all discharge documents/instructions and Rx. Earlier.

## 2014-02-10 NOTE — Care Management Note (Signed)
[facility]  Numerous attempts made to contact patient.  All phone numbers inactive.  Home address incorrect.  No known means of contacting patient.

## 2016-02-09 ENCOUNTER — Emergency Department (HOSPITAL_COMMUNITY): Payer: Medicare Other

## 2016-02-09 ENCOUNTER — Observation Stay (HOSPITAL_COMMUNITY)
Admission: EM | Admit: 2016-02-09 | Discharge: 2016-02-09 | Disposition: A | Discharge status: Home / Self Care | Payer: Medicare Other | Attending: Internal Medicine | Admitting: Internal Medicine

## 2016-02-09 ENCOUNTER — Encounter (HOSPITAL_COMMUNITY): Payer: Self-pay | Admitting: *Deleted

## 2016-02-09 DIAGNOSIS — Z79899 Other long term (current) drug therapy: Secondary | ICD-10-CM | POA: Diagnosis not present

## 2016-02-09 DIAGNOSIS — Z880 Allergy status to penicillin: Secondary | ICD-10-CM | POA: Diagnosis not present

## 2016-02-09 DIAGNOSIS — Z993 Dependence on wheelchair: Secondary | ICD-10-CM | POA: Diagnosis not present

## 2016-02-09 DIAGNOSIS — M19011 Primary osteoarthritis, right shoulder: Secondary | ICD-10-CM | POA: Diagnosis not present

## 2016-02-09 DIAGNOSIS — Z8673 Personal history of transient ischemic attack (TIA), and cerebral infarction without residual deficits: Secondary | ICD-10-CM | POA: Diagnosis not present

## 2016-02-09 DIAGNOSIS — Z87891 Personal history of nicotine dependence: Secondary | ICD-10-CM | POA: Diagnosis not present

## 2016-02-09 DIAGNOSIS — M19012 Primary osteoarthritis, left shoulder: Secondary | ICD-10-CM | POA: Diagnosis not present

## 2016-02-09 DIAGNOSIS — Z7982 Long term (current) use of aspirin: Secondary | ICD-10-CM | POA: Diagnosis not present

## 2016-02-09 DIAGNOSIS — F0390 Unspecified dementia without behavioral disturbance: Secondary | ICD-10-CM

## 2016-02-09 DIAGNOSIS — R071 Chest pain on breathing: Secondary | ICD-10-CM | POA: Diagnosis not present

## 2016-02-09 DIAGNOSIS — R079 Chest pain, unspecified: Principal | ICD-10-CM

## 2016-02-09 DIAGNOSIS — I10 Essential (primary) hypertension: Secondary | ICD-10-CM | POA: Diagnosis not present

## 2016-02-09 DIAGNOSIS — J45909 Unspecified asthma, uncomplicated: Secondary | ICD-10-CM | POA: Diagnosis not present

## 2016-02-09 DIAGNOSIS — Z7401 Bed confinement status: Secondary | ICD-10-CM | POA: Insufficient documentation

## 2016-02-09 DIAGNOSIS — E785 Hyperlipidemia, unspecified: Secondary | ICD-10-CM | POA: Diagnosis not present

## 2016-02-09 DIAGNOSIS — F03918 Unspecified dementia, unspecified severity, with other behavioral disturbance: Secondary | ICD-10-CM

## 2016-02-09 DIAGNOSIS — F0391 Unspecified dementia with behavioral disturbance: Secondary | ICD-10-CM

## 2016-02-09 DIAGNOSIS — Z8249 Family history of ischemic heart disease and other diseases of the circulatory system: Secondary | ICD-10-CM | POA: Insufficient documentation

## 2016-02-09 DIAGNOSIS — I2510 Atherosclerotic heart disease of native coronary artery without angina pectoris: Secondary | ICD-10-CM | POA: Insufficient documentation

## 2016-02-09 LAB — BASIC METABOLIC PANEL
ANION GAP: 9 (ref 5–15)
BUN: 9 mg/dL (ref 6–20)
CO2: 26 mmol/L (ref 22–32)
Calcium: 9.5 mg/dL (ref 8.9–10.3)
Chloride: 103 mmol/L (ref 101–111)
Creatinine, Ser: 0.87 mg/dL (ref 0.61–1.24)
GFR calc non Af Amer: >60 mL/min (ref >60)
GLUCOSE: 93 mg/dL (ref 65–99)
POTASSIUM: 4 mmol/L (ref 3.5–5.1)
Sodium: 138 mmol/L (ref 135–145)

## 2016-02-09 LAB — CBC
HEMATOCRIT: 39.9 % (ref 39.0–52.0)
HEMOGLOBIN: 13.3 g/dL (ref 13.0–17.0)
MCH: 26.9 pg (ref 26.0–34.0)
MCHC: 33.3 g/dL (ref 30.0–36.0)
MCV: 80.8 fL (ref 78.0–100.0)
Platelets: 209 10*3/uL (ref 150–400)
RBC: 4.94 MIL/uL (ref 4.22–5.81)
RDW: 16.2 % — ABNORMAL HIGH (ref 11.5–15.5)
WBC: 9.9 10*3/uL (ref 4.0–10.5)

## 2016-02-09 LAB — I-STAT TROPONIN, ED: Troponin i, poc: 0.01 ng/mL (ref 0.00–0.08)

## 2016-02-09 NOTE — ED Provider Notes (Signed)
[facility]-EMERGENCY DEPT Provider Note   CSN: [id number] Arrival date & time: 02/09/16  1339     History   Chief Complaint Chief Complaint  Patient presents with  . Chest Pain  . Leg Pain    HPI Johnathan Mitchell is a 82 y.o. male.  The history is provided by the patient.  Chest Pain   This is a new problem. Episode onset: 2 weeks. The problem occurs daily. The problem has been gradually worsening. The pain is associated with breathing. The pain is present in the substernal region. The pain is moderate (although pain resolved after nitro). The quality of the pain is described as brief and sharp. The pain does not radiate. The symptoms are aggravated by deep breathing. Pertinent negatives include no abdominal pain, no back pain, no cough, no exertional chest pressure, no fever, no hemoptysis, no leg pain, no lower extremity edema, no nausea, no near-syncope, no palpitations, no shortness of breath, no vomiting and no weakness. He has tried nitroglycerin for the symptoms. Risk factors include male gender and being elderly.  His past medical history is significant for hyperlipidemia, hypertension and strokes.  His family medical history is significant for CAD.  Pt states he only has leg pain when he tries to stand but denies pain at this time.  Past Medical History:  Diagnosis Date  . Asthma   . Hypertension   . Seizures (HCC)   . Stroke [facility](HCC)     Patient Active Problem List   Diagnosis Date Noted  . Chest pain 02/09/2016  . HTN (hypertension) 02/09/2016  . Dementia 02/09/2016  . Chest pain at rest 02/09/2016  . CVA (cerebral infarction) 02/01/2014  . Benign essential HTN 02/01/2014    Past Surgical History:  Procedure Laterality Date  . BACK SURGERY    . KNEE SURGERY         Home Medications    Prior to Admission medications   Medication Sig Start Date End Date Taking? Authorizing Provider  aspirin 81 MG tablet Take 1 tablet (81 mg total) by mouth daily. 02/02/14   Leana Roeawood  S Elgergawy, MD  atorvastatin (LIPITOR) 20 MG tablet Take 10 mg by mouth at bedtime.    Historical Provider, MD  donepezil (ARICEPT) 10 MG tablet Take 5 mg by mouth every morning.    Historical Provider, MD  etodolac (LODINE) 200 MG capsule Take 200 mg by mouth daily as needed for moderate pain.     Historical Provider, MD  lisinopril (PRINIVIL,ZESTRIL) 5 MG tablet Take 2.5 mg by mouth daily.    Historical Provider, MD  traMADol (ULTRAM) 50 MG tablet Take 1 tablet (50 mg total) by mouth every 6 (six) hours as needed. 06/25/13   Elwin MochaBlair Walden, MD    Family History Family History  Problem Relation Age of Onset  . Hyperlipidemia Mother   . Hypertension Mother     Social History Social History  Substance Use Topics  . Smoking status: Former Games developermoker  . Smokeless tobacco: Never Used  . Alcohol use No     Allergies   Penicillins   Review of Systems Review of Systems  Constitutional: Negative for fever.  HENT: Negative.   Respiratory: Negative for cough, hemoptysis and shortness of breath.   Cardiovascular: Positive for chest pain. Negative for palpitations and near-syncope.  Gastrointestinal: Negative for abdominal pain, diarrhea, nausea and vomiting.  Genitourinary: Negative.   Musculoskeletal: Negative for back pain and neck pain.  Neurological: Negative for weakness.  Physical Exam Updated Vital Signs BP 122/98   Pulse 88   Temp 98 F (36.7 C) (Oral)   Resp 14   SpO2 99%   Physical Exam  Constitutional: He is oriented to person, place, and time. He appears well-developed and well-nourished. No distress.  Elderly  HENT:  Head: Normocephalic and atraumatic.  Eyes: EOM are normal. Pupils are equal, round, and reactive to light.  Neck: Normal range of motion. Neck supple. No tracheal deviation present.  Cardiovascular: Normal rate, regular rhythm, normal heart sounds and intact distal pulses.  Exam reveals no gallop and no friction rub.   No murmur  heard. Pulmonary/Chest: Effort normal and breath sounds normal. No stridor. No respiratory distress. He has no wheezes. He has no rales. He exhibits no tenderness.  Abdominal: Soft. He exhibits no distension. There is no tenderness.  Musculoskeletal: Normal range of motion. He exhibits no edema or tenderness.  Neurological: He is alert and oriented to person, place, and time.  Poor historian.  Skin: Skin is warm and dry. Capillary refill takes less than 2 seconds. He is not diaphoretic.  Nursing note and vitals reviewed.    ED Treatments / Results  Labs (all labs ordered are listed, but only abnormal results are displayed) Labs Reviewed  CBC - Abnormal; Notable for the following:       Result Value   RDW 16.2 (*)    All other components within normal limits  BASIC METABOLIC PANEL  I-STAT TROPOININ, ED    EKG  EKG Interpretation  Date/Time:  Friday February 09 2016 13:57:38 EST Ventricular Rate:  92 PR Interval:  200 QRS Duration: 64 QT Interval:  352 QTC Calculation: 435 R Axis:   25 Text Interpretation:  Normal sinus rhythm Normal ECG No significant change since last tracing Confirmed by Ethelda Chick  MD, SAM [phone]) on 02/09/2016 3:06:47 PM       Radiology Dg Chest 2 View  Result Date: 02/09/2016 CLINICAL DATA:  Left anterior chest pain 2 days ago. EXAM: CHEST  2 VIEW COMPARISON:  None. FINDINGS: There is no focal parenchymal opacity. There is no pleural effusion or pneumothorax. The heart and mediastinal contours are unremarkable. 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 IMPRESSION: No active cardiopulmonary disease. Electronically Signed   By: Elige Ko   On: 02/09/2016 14:44    Procedures Procedures (including critical care time)  Medications Ordered in ED Medications - No data to display   Initial Impression / Assessment and Plan / ED Course  I have reviewed the triage vital signs and the nursing notes.  Pertinent labs & imaging  results that were available during my care of the patient were reviewed by me and considered in my medical decision making (see chart for details).  Clinical Course    81yoM with hx of stroke and HTN who is wheelchair bound presenting with sharp substernal chest pain that worsens on inspiration but relieved with nitro. EKG with non specific changes. CXR without signs of PNA, PTX, or widened mediastinum. Pain resolved with nitro. No signs of Right heart strain on EKG or DVT on exam. Doubt PE HEAR score 5. Trop negative. Labs unremarkable.  Will admit for ACS rule out.  Final Clinical Impressions(s) / ED Diagnoses   Final diagnoses:  Chest pain, unspecified type    New Prescriptions New Prescriptions   No medications on file     Alper Guilmet[place] Quincee Gittens, MD 02/09/16 1647    Doug Sou, MD 02/09/16 2354

## 2016-02-09 NOTE — ED Notes (Signed)
Patient Alert and oriented X4. Stable and ambulatory. Patient verbalized understanding of the discharge instructions.  Patient belongings were taken by the patient.  

## 2016-02-09 NOTE — Consult Note (Signed)
Medical Consultation   Johnathan BeardsJames C Campusano  ZOX:[id number]  DOB: 22-Dec-1934  DOA: 02/09/2016  PCP: PROVIDER NOT IN SYSTEM     Requesting physician: Doug SouSam Jacubowitz  Reason for consultation: Chest pain   History of Present Illness: Johnathan Mitchell is an 82 y.o. male  With a h/o HTN, CVA who is wheelchair bound. While he was being bathed today by his caretaker, he began to have pain in the center of his chest. This was only present when he took a deep breath. He was brought to the hospital by his daughter. He continues to have mild central chest pain with deep breaths. 3/10 in intensity, non-radiating. Not  Reproducible on exam. No recent cough or congestion. No swelling of lower extremities. No other associated symptoms such as diaphoresis or palpations.     Review of Systems:  ROS As per HPI otherwise 10 point review of systems negative.     Past Medical History: Past Medical History:  Diagnosis Date  . Asthma   . Hypertension   . Seizures (HCC)   . Stroke [facility](HCC)     Past Surgical History: Past Surgical History:  Procedure Laterality Date  . BACK SURGERY    . KNEE SURGERY       Allergies:   Allergies  Allergen Reactions  . Penicillins Hives and Swelling     Social History:  reports that he has quit smoking. He has never used smokeless tobacco. He reports that he does not drink alcohol or use drugs.   Family History: Family History  Problem Relation Age of Onset  . Hyperlipidemia Mother   . Hypertension Mother    Physical Exam: Vitals:   02/09/16 1500 02/09/16 1530 02/09/16 1600 02/09/16 1630  BP:  133/98 133/86 122/98  Pulse: 92 90 88 88  Resp: 16 15 19 14   Temp:      TempSrc:      SpO2: 100% 98% 98% 99%    Constitutional   Alert and awake, oriented x3, not in any acute distress. Eyes: PERLA, EOMI, irises appear normal, anicteric sclera,  ENMT: external ears and nose appear normal             Lips appears normal, oropharynx mucosa, tongue,  posterior pharynx appear normal  Neck: neck appears normal, no masses, normal ROM, no thyromegaly, no JVD  CVS: S1-S2 clear, no murmur rubs or gallops, no LE edema, normal pedal pulses  Respiratory:  clear to auscultation bilaterally, no wheezing, rales or rhonchi. Respiratory effort normal. No accessory muscle use.  Abdomen: soft nontender, nondistended, normal bowel sounds, no hepatosplenomegaly, no hernias  Musculoskeletal: : no cyanosis, clubbing or edema noted bilaterally         Neuro: Cranial nerves II-XII intact, strength, sensation, reflexes Psych: judgement and insight appear normal, stable mood and affect, mental status Skin: no rashes or lesions or ulcers, no induration or nodules   Data reviewed:  I have personally reviewed following labs and imaging studies Labs:  CBC:  Recent Labs Lab 02/09/16 1530  WBC 9.9  HGB 13.3  HCT 39.9  MCV 80.8  PLT 209    Basic Metabolic Panel:  Recent Labs Lab 02/09/16 1530  NA 138  K 4.0  CL 103  CO2 26  GLUCOSE 93  BUN 9  CREATININE 0.87  CALCIUM 9.5   GFR CrCl cannot be calculated (Unknown ideal weight.). Liver Function Tests: No results for input(s):  AST, ALT, ALKPHOS, BILITOT, PROT, ALBUMIN in the last 168 hours. No results for input(s): LIPASE, AMYLASE in the last 168 hours. No results for input(s): AMMONIA in the last 168 hours. Coagulation profile No results for input(s): INR, PROTIME in the last 168 hours.  Cardiac Enzymes: No results for input(s): CKTOTAL, CKMB, CKMBINDEX, TROPONINI in the last 168 hours. BNP: Invalid input(s): POCBNP CBG: No results for input(s): GLUCAP in the last 168 hours. D-Dimer No results for input(s): DDIMER in the last 72 hours. Hgb A1c No results for input(s): HGBA1C in the last 72 hours. Lipid Profile No results for input(s): CHOL, HDL, LDLCALC, TRIG, CHOLHDL, LDLDIRECT in the last 72 hours. Thyroid function studies No results for input(s): TSH, T4TOTAL, T3FREE, THYROIDAB in  the last 72 hours.  Invalid input(s): FREET3 Anemia work up No results for input(s): VITAMINB12, FOLATE, FERRITIN, TIBC, IRON, RETICCTPCT in the last 72 hours.  Microbiology No results found for this or any previous visit (from the past 240 hour(s)).   Radiological Exams on Admission: Dg Chest 2 View  Result Date: 02/09/2016 CLINICAL DATA:  Left anterior chest pain 2 days ago. EXAM: CHEST  2 VIEW COMPARISON:  None. FINDINGS: There is no focal parenchymal opacity. There is no pleural effusion or pneumothorax. The heart and mediastinal contours are unremarkable. 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 IMPRESSION: No active cardiopulmonary disease. Electronically Signed   By: Elige Ko   On: 02/09/2016 14:44    Impression/Recommendations Principal Problem:   Chest pain - pleuritic- Wells criteria < 2 therefore I do not feel a CT to r/o PE is indicated - troponin 0.01, EKG unremarkable- pain atypical for cardiac etiology- no need to w/u for ACS - CXR clear- no infiltrates or mass - no h/o GERD - will d/c home from ED- have advised Tylenol PRN and monitor for onset of other respiratory symptoms such as cough or congestion  Active Problems:   HTN (hypertension) - cont Lisinopril    Dementia - mild- lives with daughter  Bedbound after CVA    Time Spent: 50 min  Xyler Terpening M.D. [facility] 02/09/2016, 5:06 PM

## 2016-02-09 NOTE — ED Notes (Signed)
Patient transported to X-ray 

## 2016-02-09 NOTE — ED Notes (Signed)
C/o left anterior chest pain onset 2 days ago, states the pain comes and goes. Pain is below left breast. Was given 324 ASA per ems. Also c/o right leg pain states he has had a stroke in the past and his right leg normally hurts.

## 2016-02-09 NOTE — ED Provider Notes (Signed)
Patient is extremely poor historian Complained of anterior chest pain earlier this morning which has since resolved. He reports he has leg pain "when I try to stand up" however didn't no pain presently. He is presently asymptomatic on exam alert no distress lungs clear auscultation heart regular rate and rhythm extremities without edema skin warm   Doug SouSam Alexzandrea Normington, MD 02/09/16 2353

## 2016-02-09 NOTE — Discharge Planning (Signed)
Pt mentioned the need of 3n1 upon discharge home.  EDCM will follow pt disposition and order 3n1 prior to discharge home.

## 2016-03-27 DIAGNOSIS — R4781 Slurred speech: Secondary | ICD-10-CM | POA: Diagnosis not present

## 2016-03-27 DIAGNOSIS — I6789 Other cerebrovascular disease: Secondary | ICD-10-CM | POA: Diagnosis not present

## 2016-07-23 ENCOUNTER — Emergency Department (HOSPITAL_COMMUNITY): Payer: Medicare Other

## 2016-07-23 ENCOUNTER — Encounter (HOSPITAL_COMMUNITY): Payer: Self-pay | Admitting: Emergency Medicine

## 2016-07-23 ENCOUNTER — Emergency Department (HOSPITAL_COMMUNITY)
Admission: EM | Admit: 2016-07-23 | Discharge: 2016-07-23 | Disposition: A | Discharge status: Home / Self Care | Payer: Medicare Other | Attending: Emergency Medicine | Admitting: Emergency Medicine

## 2016-07-23 DIAGNOSIS — I10 Essential (primary) hypertension: Secondary | ICD-10-CM | POA: Insufficient documentation

## 2016-07-23 DIAGNOSIS — Z7982 Long term (current) use of aspirin: Secondary | ICD-10-CM | POA: Insufficient documentation

## 2016-07-23 DIAGNOSIS — F0390 Unspecified dementia without behavioral disturbance: Secondary | ICD-10-CM | POA: Diagnosis not present

## 2016-07-23 DIAGNOSIS — R918 Other nonspecific abnormal finding of lung field: Secondary | ICD-10-CM | POA: Diagnosis not present

## 2016-07-23 DIAGNOSIS — Z1389 Encounter for screening for other disorder: Secondary | ICD-10-CM | POA: Diagnosis not present

## 2016-07-23 DIAGNOSIS — Z79899 Other long term (current) drug therapy: Secondary | ICD-10-CM | POA: Diagnosis not present

## 2016-07-23 DIAGNOSIS — R4182 Altered mental status, unspecified: Secondary | ICD-10-CM | POA: Diagnosis not present

## 2016-07-23 DIAGNOSIS — Z139 Encounter for screening, unspecified: Secondary | ICD-10-CM

## 2016-07-23 DIAGNOSIS — Z046 Encounter for general psychiatric examination, requested by authority: Secondary | ICD-10-CM | POA: Diagnosis not present

## 2016-07-23 DIAGNOSIS — Z87891 Personal history of nicotine dependence: Secondary | ICD-10-CM | POA: Insufficient documentation

## 2016-07-23 DIAGNOSIS — J45909 Unspecified asthma, uncomplicated: Secondary | ICD-10-CM | POA: Diagnosis not present

## 2016-07-23 HISTORY — DX: Unspecified dementia, unspecified severity, without behavioral disturbance, psychotic disturbance, mood disturbance, and anxiety: F03.90

## 2016-07-23 LAB — URINALYSIS, ROUTINE W REFLEX MICROSCOPIC
Bacteria, UA: NONE SEEN
Bilirubin Urine: NEGATIVE
GLUCOSE, UA: NEGATIVE mg/dL
Ketones, ur: 5 mg/dL — AB
LEUKOCYTES UA: NEGATIVE
NITRITE: NEGATIVE
PH: 6 (ref 5.0–8.0)
Protein, ur: 30 mg/dL — AB
SPECIFIC GRAVITY, URINE: 1.018 (ref 1.005–1.030)

## 2016-07-23 LAB — CBC WITH DIFFERENTIAL/PLATELET
Basophils Absolute: 0 10*3/uL (ref 0.0–0.1)
Basophils Relative: 1 %
EOS ABS: 0.1 10*3/uL (ref 0.0–0.7)
EOS PCT: 1 %
HCT: 40.4 % (ref 39.0–52.0)
Hemoglobin: 13.3 g/dL (ref 13.0–17.0)
LYMPHS PCT: 17 %
Lymphs Abs: 1 10*3/uL (ref 0.7–4.0)
MCH: 26.8 pg (ref 26.0–34.0)
MCHC: 32.9 g/dL (ref 30.0–36.0)
MCV: 81.3 fL (ref 78.0–100.0)
Monocytes Absolute: 0.5 10*3/uL (ref 0.1–1.0)
Monocytes Relative: 9 %
Neutro Abs: 4.1 10*3/uL (ref 1.7–7.7)
Neutrophils Relative %: 72 %
PLATELETS: 200 10*3/uL (ref 150–400)
RBC: 4.97 MIL/uL (ref 4.22–5.81)
RDW: 15.7 % — ABNORMAL HIGH (ref 11.5–15.5)
WBC: 5.6 10*3/uL (ref 4.0–10.5)

## 2016-07-23 LAB — COMPREHENSIVE METABOLIC PANEL
ALBUMIN: 3.7 g/dL (ref 3.5–5.0)
ALT: 12 U/L — ABNORMAL LOW (ref 17–63)
ANION GAP: 8 (ref 5–15)
AST: 17 U/L (ref 15–41)
Alkaline Phosphatase: 58 U/L (ref 38–126)
BILIRUBIN TOTAL: 0.8 mg/dL (ref 0.3–1.2)
BUN: 10 mg/dL (ref 6–20)
CO2: 27 mmol/L (ref 22–32)
Calcium: 9.3 mg/dL (ref 8.9–10.3)
Chloride: 101 mmol/L (ref 101–111)
Creatinine, Ser: 0.88 mg/dL (ref 0.61–1.24)
GFR calc Af Amer: >60 mL/min (ref >60)
GFR calc non Af Amer: >60 mL/min (ref >60)
GLUCOSE: 100 mg/dL — AB (ref 65–99)
POTASSIUM: 3.9 mmol/L (ref 3.5–5.1)
Sodium: 136 mmol/L (ref 135–145)
TOTAL PROTEIN: 7.9 g/dL (ref 6.5–8.1)

## 2016-07-23 LAB — RAPID URINE DRUG SCREEN, HOSP PERFORMED
AMPHETAMINES: NOT DETECTED
BARBITURATES: NOT DETECTED
Benzodiazepines: NOT DETECTED
Cocaine: NOT DETECTED
OPIATES: NOT DETECTED
TETRAHYDROCANNABINOL: POSITIVE — AB

## 2016-07-23 LAB — LACTIC ACID, PLASMA: Lactic Acid, Venous: 1.1 mmol/L (ref 0.5–1.9)

## 2016-07-23 LAB — TROPONIN I: Troponin I: <0.03 ng/mL (ref <0.03)

## 2016-07-23 LAB — ETHANOL

## 2016-07-23 NOTE — ED Provider Notes (Signed)
[facility]-EMERGENCY DEPT Provider Note   CSN: [id number] Arrival date & time: 07/23/16  1117     History   Chief Complaint Chief Complaint  Patient presents with  . Psychiatric Evaluation    HPI Johnathan Mitchell is a 82 y.o. male.  The history is provided by the patient, a relative and the EMS personnel. The history is limited by the condition of the patient (Hx dementia).  Pt was seen at 1140. Per EMS per pt's daughter: Pt's daughter states pt has hx of dementia and wandered out of the house this morning while she was sleeping, so she sent pt to the ED for evaluation. Pt also has not taken his meds in 2 weeks, but has PMD appointment in 3 days. Pt himself has hx of dementia and does not know why he is here. Denies any complaints.   Past Medical History:  Diagnosis Date  . Asthma   . Dementia   . Hypertension   . Seizures (HCC)   . Stroke [facility])     Patient Active Problem List   Diagnosis Date Noted  . Chest pain 02/09/2016  . HTN (hypertension) 02/09/2016  . Dementia 02/09/2016  . Chest pain at rest 02/09/2016  . CVA (cerebral infarction) 02/01/2014  . Benign essential HTN 02/01/2014    Past Surgical History:  Procedure Laterality Date  . BACK SURGERY    . KNEE SURGERY         Home Medications    Prior to Admission medications   Medication Sig Start Date End Date Taking? Authorizing Provider  aspirin 81 MG tablet Take 1 tablet (81 mg total) by mouth daily. 02/02/14  Yes Elgergawy, Leana Roe, MD  atorvastatin (LIPITOR) 20 MG tablet Take 10 mg by mouth at bedtime.   Yes [provider]  donepezil (ARICEPT) 10 MG tablet Take 5 mg by mouth every morning.   Yes [provider]  etodolac (LODINE) 200 MG capsule Take 200 mg by mouth daily as needed for moderate pain.    Yes [provider]  lisinopril (PRINIVIL,ZESTRIL) 5 MG tablet Take 2.5 mg by mouth daily.   Yes [provider]  traMADol (ULTRAM) 50 MG tablet Take 1 tablet (50 mg total)  by mouth every 6 (six) hours as needed. 06/25/13  Yes Elwin Mocha, MD    Family History Family History  Problem Relation Age of Onset  . Hyperlipidemia Mother   . Hypertension Mother     Social History Social History  Substance Use Topics  . Smoking status: Former Games developer  . Smokeless tobacco: Never Used  . Alcohol use No     Allergies   Penicillins   Review of Systems Review of Systems  Unable to perform ROS: Dementia     Physical Exam Updated Vital Signs BP (!) 115/98 (BP Location: Left Arm)   Pulse 88   Temp 98.3 F (36.8 C) (Oral)   Resp 12   SpO2 96%   Physical Exam 1145: Physical examination:  Nursing notes reviewed; Vital signs and O2 SAT reviewed;  Constitutional: Well developed, Well nourished, Well hydrated, In no acute distress; Head:  Normocephalic, atraumatic; Eyes: EOMI, PERRL, No scleral icterus; ENMT: Mouth and pharynx normal, Mucous membranes moist; Neck: Supple, Full range of motion, No lymphadenopathy; Cardiovascular: Regular rate and rhythm, No gallop; Respiratory: Breath sounds clear & equal bilaterally, No wheezes.  Speaking full sentences with ease, Normal respiratory effort/excursion; Chest: Nontender, Movement normal; Abdomen: Soft, Nontender, Nondistended, Normal bowel sounds; Genitourinary: No CVA  tenderness; Extremities: Pulses normal, No tenderness, No edema, No calf edema or asymmetry.; Neuro: Awake, alert, confused per hx dementia. Major CN grossly intact.  Speech clear. Moves all extremities spontaneously on stretcher without apparent gross focal motor deficits.; Skin: Color normal, Warm, Dry.   ED Treatments / Results  Labs (all labs ordered are listed, but only abnormal results are displayed)   EKG  EKG Interpretation  Date/Time:  Tuesday July 23 2016 11:59:33 EDT Ventricular Rate:  85 PR Interval:    QRS Duration: 106 QT Interval:  355 QTC Calculation: 423 R Axis:   54 Text Interpretation:  Sinus rhythm Low voltage,  precordial leads When compared with ECG of 02/09/2016 No significant change was found Confirmed by [facility]  MD, Nicholos Johns [phone]) on 07/23/2016 12:07:30 PM       Radiology   Procedures Procedures (including critical care time)  Medications Ordered in ED Medications - No data to display   Initial Impression / Assessment and Plan / ED Course  I have reviewed the triage vital signs and the nursing notes.  Pertinent labs & imaging results that were available during my care of the patient were reviewed by me and considered in my medical decision making (see chart for details).  MDM Reviewed: previous chart, nursing note and vitals Reviewed previous: labs and ECG Interpretation: labs, ECG, x-ray and CT scan   Results for orders placed or performed during the hospital encounter of 07/23/16  Urinalysis, Routine w reflex microscopic  Result Value Ref Range   Color, Urine YELLOW YELLOW   APPearance CLEAR CLEAR   Specific Gravity, Urine 1.018 1.005 - 1.030   pH 6.0 5.0 - 8.0   Glucose, UA NEGATIVE NEGATIVE mg/dL   Hgb urine dipstick SMALL (A) NEGATIVE   Bilirubin Urine NEGATIVE NEGATIVE   Ketones, ur 5 (A) NEGATIVE mg/dL   Protein, ur 30 (A) NEGATIVE mg/dL   Nitrite NEGATIVE NEGATIVE   Leukocytes, UA NEGATIVE NEGATIVE   RBC / HPF 0-5 0 - 5 RBC/hpf   WBC, UA 0-5 0 - 5 WBC/hpf   Bacteria, UA NONE SEEN NONE SEEN   Squamous Epithelial / LPF 0-5 (A) NONE SEEN   Mucous PRESENT    Hyaline Casts, UA PRESENT   Comprehensive metabolic panel  Result Value Ref Range   Sodium 136 135 - 145 mmol/L   Potassium 3.9 3.5 - 5.1 mmol/L   Chloride 101 101 - 111 mmol/L   CO2 27 22 - 32 mmol/L   Glucose, Bld 100 (H) 65 - 99 mg/dL   BUN 10 6 - 20 mg/dL   Creatinine, Ser 4.09 0.61 - 1.24 mg/dL   Calcium 9.3 8.9 - 81.1 mg/dL   Total Protein 7.9 6.5 - 8.1 g/dL   Albumin 3.7 3.5 - 5.0 g/dL   AST 17 15 - 41 U/L   ALT 12 (L) 17 - 63 U/L   Alkaline Phosphatase 58 38 - 126 U/L   Total Bilirubin 0.8  0.3 - 1.2 mg/dL   GFR calc non Af Amer >60 >60 mL/min   GFR calc Af Amer >60 >60 mL/min   Anion gap 8 5 - 15  Ethanol  Result Value Ref Range   Alcohol, Ethyl (B) <5 <5 mg/dL  Troponin I  Result Value Ref Range   Troponin I <0.03 <0.03 ng/mL  Lactic acid, plasma  Result Value Ref Range   Lactic Acid, Venous 1.1 0.5 - 1.9 mmol/L  CBC with Differential  Result Value Ref Range   WBC  5.6 4.0 - 10.5 K/uL   RBC 4.97 4.22 - 5.81 MIL/uL   Hemoglobin 13.3 13.0 - 17.0 g/dL   HCT 16.140.4 09.639.0 - 04.552.0 %   MCV 81.3 78.0 - 100.0 fL   MCH 26.8 26.0 - 34.0 pg   MCHC 32.9 30.0 - 36.0 g/dL   RDW 40.915.7 (H) 81.111.5 - 91.415.5 %   Platelets 200 150 - 400 K/uL   Neutrophils Relative % 72 %   Neutro Abs 4.1 1.7 - 7.7 K/uL   Lymphocytes Relative 17 %   Lymphs Abs 1.0 0.7 - 4.0 K/uL   Monocytes Relative 9 %   Monocytes Absolute 0.5 0.1 - 1.0 K/uL   Eosinophils Relative 1 %   Eosinophils Absolute 0.1 0.0 - 0.7 K/uL   Basophils Relative 1 %   Basophils Absolute 0.0 0.0 - 0.1 K/uL   Dg Chest 2 View Result Date: 07/23/2016 CLINICAL DATA:  Confusion EXAM: CHEST  2 VIEW COMPARISON:  02/09/2016 FINDINGS: Heart and mediastinal contours are within normal limits. No focal opacities or effusions. No acute bony abnormality. IMPRESSION: No active cardiopulmonary disease. Electronically Signed   By: Charlett NoseKevin  Dover M.D.   On: 07/23/2016 12:16   Ct Head Wo Contrast Result Date: 07/23/2016 CLINICAL DATA:  Altered mental status, confusion EXAM: CT HEAD WITHOUT CONTRAST TECHNIQUE: Contiguous axial images were obtained from the base of the skull through the vertex without intravenous contrast. COMPARISON:  MRI 02/01/2014 FINDINGS: Brain: Diffuse chronic microvascular disease throughout the deep white matter, stable. old infarct throughout the left basal ganglia and corona radiata, stable. No acute infarction, hemorrhage or hydrocephalus. Vascular: No hyperdense vessel or unexpected calcification. Skull: No acute calvarial abnormality.  Sinuses/Orbits: Visualized paranasal sinuses and mastoids clear. Orbital soft tissues unremarkable. Other: None IMPRESSION: No acute intracranial abnormality. Electronically Signed   By: Charlett NoseKevin  Dover M.D.   On: 07/23/2016 12:25    1300:  Pt's Home Health RN now at bedside: states pt has hx dementia and PTSD, pt often will hold a knife in his hand and have what she feels are "flashbacks to [place]." She states she is able to take the knife away from him and calm him. Pt was with his daughter today, and left the house. She found him down the street with a knife in his hand. Pt's daughter called Home Health RN "freaking out" and then called 911 to bring pt to the ED. Workup is reassuring. Pt has tol PO well while in the ED and has remained calm/cooperative. Will have TTS consult.   1455:  TTS has evaluated pt: states no criteria to admit pt at this time, [facility]). Dx and testing d/w pt and Home Health RN.  Questions answered.  Verb understanding, agreeable to d/c home with outpt f/u.    Final Clinical Impressions(s) / ED Diagnoses   Final diagnoses:  None    New Prescriptions New Prescriptions   No medications on file      Samuel JesterMcManus, Parissa Chiao, DO 07/26/16 [id number]
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
## 2016-07-23 NOTE — ED Notes (Signed)
TTS at bedside. 

## 2016-07-23 NOTE — Discharge Instructions (Signed)
Take your usual prescriptions as previously directed.  Call your regular medical doctor today to confirm your previously scheduled follow up appointment for this Friday. Return to the Emergency Department immediately sooner if worsening.    For your behavioral health needs, you are advised to follow up with the [facility]:       [facility]      [address]      [place], [place] [address]      [phone]

## 2016-07-23 NOTE — ED Notes (Signed)
Bed: WA04 Expected date:  Expected time:  Means of arrival:  Comments: EMS-psych eval

## 2016-07-23 NOTE — BH Assessment (Signed)
[facility]  Per Elta GuadeloupeLaurie Parks, NP, this pt does not require psychiatric hospitalization at this time.  [facility].  This has been included in pt's discharge instructions.  Pt's nurse has been notified.  Doylene Canninghomas Youcef Klas, MA Triage Specialist [phone]

## 2016-07-23 NOTE — ED Notes (Signed)
Patient's daughter, Asher MuirJamie, [phone] Patient's care taker, Lynnda ShieldsCyndi, [phone]

## 2016-07-23 NOTE — ED Notes (Signed)
ED Provider at bedside. 

## 2016-07-23 NOTE — ED Notes (Signed)
Attempted to call patient's daughter for transport home. Called patient's caregiver, reports she will contact patient's daughter.

## 2016-07-23 NOTE — ED Notes (Signed)
Patient home health nurse at bedside.

## 2016-07-23 NOTE — ED Triage Notes (Signed)
Per EMS, patient from home, where he lives with daughter. Daughter reports patient wandered out of house this morning while she was sleeping and wants patient to have evaluation. A&Ox1. Home health nurse reports patient has not had medications in 2 weeks, but has appointment with PCP on Friday. Patient has no complaints.  Hx dementia and depression.  BP 130/98 HR 96 RR 16 O2 98% CBG 106

## 2016-08-21 ENCOUNTER — Emergency Department (HOSPITAL_COMMUNITY): Payer: Medicare Other

## 2016-08-21 ENCOUNTER — Encounter (HOSPITAL_COMMUNITY): Payer: Self-pay | Admitting: Emergency Medicine

## 2016-08-21 ENCOUNTER — Emergency Department (HOSPITAL_COMMUNITY)
Admission: EM | Admit: 2016-08-21 | Discharge: 2016-08-23 | Disposition: A | Discharge status: Other Acute Inpatient Hospital | Payer: Medicare Other | Attending: Emergency Medicine | Admitting: Emergency Medicine

## 2016-08-21 DIAGNOSIS — F0391 Unspecified dementia with behavioral disturbance: Secondary | ICD-10-CM | POA: Diagnosis not present

## 2016-08-21 DIAGNOSIS — Z87891 Personal history of nicotine dependence: Secondary | ICD-10-CM | POA: Diagnosis not present

## 2016-08-21 DIAGNOSIS — Z23 Encounter for immunization: Secondary | ICD-10-CM | POA: Insufficient documentation

## 2016-08-21 DIAGNOSIS — Z049 Encounter for examination and observation for unspecified reason: Secondary | ICD-10-CM

## 2016-08-21 DIAGNOSIS — R4589 Other symptoms and signs involving emotional state: Secondary | ICD-10-CM | POA: Insufficient documentation

## 2016-08-21 DIAGNOSIS — F919 Conduct disorder, unspecified: Secondary | ICD-10-CM | POA: Diagnosis not present

## 2016-08-21 DIAGNOSIS — R4182 Altered mental status, unspecified: Secondary | ICD-10-CM | POA: Diagnosis not present

## 2016-08-21 DIAGNOSIS — S0083XA Contusion of other part of head, initial encounter: Secondary | ICD-10-CM | POA: Diagnosis not present

## 2016-08-21 DIAGNOSIS — R45850 Homicidal ideations: Secondary | ICD-10-CM | POA: Diagnosis not present

## 2016-08-21 DIAGNOSIS — F03918 Unspecified dementia, unspecified severity, with other behavioral disturbance: Secondary | ICD-10-CM | POA: Diagnosis present

## 2016-08-21 DIAGNOSIS — R4689 Other symptoms and signs involving appearance and behavior: Secondary | ICD-10-CM

## 2016-08-21 DIAGNOSIS — T148XXA Other injury of unspecified body region, initial encounter: Secondary | ICD-10-CM | POA: Diagnosis not present

## 2016-08-21 DIAGNOSIS — I10 Essential (primary) hypertension: Secondary | ICD-10-CM | POA: Diagnosis not present

## 2016-08-21 DIAGNOSIS — Z7982 Long term (current) use of aspirin: Secondary | ICD-10-CM | POA: Diagnosis not present

## 2016-08-21 DIAGNOSIS — I517 Cardiomegaly: Secondary | ICD-10-CM | POA: Diagnosis not present

## 2016-08-21 DIAGNOSIS — R509 Fever, unspecified: Secondary | ICD-10-CM | POA: Diagnosis not present

## 2016-08-21 DIAGNOSIS — Z79899 Other long term (current) drug therapy: Secondary | ICD-10-CM | POA: Diagnosis not present

## 2016-08-21 DIAGNOSIS — M25531 Pain in right wrist: Secondary | ICD-10-CM | POA: Diagnosis not present

## 2016-08-21 DIAGNOSIS — S60811A Abrasion of right wrist, initial encounter: Secondary | ICD-10-CM | POA: Diagnosis not present

## 2016-08-21 LAB — CBC WITH DIFFERENTIAL/PLATELET
BASOS PCT: 0 %
Basophils Absolute: 0 10*3/uL (ref 0.0–0.1)
EOS ABS: 0.1 10*3/uL (ref 0.0–0.7)
EOS PCT: 1 %
HCT: 37.4 % — ABNORMAL LOW (ref 39.0–52.0)
Hemoglobin: 12.5 g/dL — ABNORMAL LOW (ref 13.0–17.0)
Lymphocytes Relative: 15 %
Lymphs Abs: 1.2 10*3/uL (ref 0.7–4.0)
MCH: 26.5 pg (ref 26.0–34.0)
MCHC: 33.4 g/dL (ref 30.0–36.0)
MCV: 79.2 fL (ref 78.0–100.0)
MONO ABS: 0.8 10*3/uL (ref 0.1–1.0)
Monocytes Relative: 10 %
Neutro Abs: 6.1 10*3/uL (ref 1.7–7.7)
Neutrophils Relative %: 74 %
PLATELETS: 252 10*3/uL (ref 150–400)
RBC: 4.72 MIL/uL (ref 4.22–5.81)
RDW: 15.4 % (ref 11.5–15.5)
WBC: 8.2 10*3/uL (ref 4.0–10.5)

## 2016-08-21 LAB — URINALYSIS, ROUTINE W REFLEX MICROSCOPIC
BACTERIA UA: NONE SEEN
Bilirubin Urine: NEGATIVE
GLUCOSE, UA: NEGATIVE mg/dL
Ketones, ur: NEGATIVE mg/dL
LEUKOCYTES UA: NEGATIVE
NITRITE: NEGATIVE
PROTEIN: 100 mg/dL — AB
SPECIFIC GRAVITY, URINE: 1.023 (ref 1.005–1.030)
pH: 5 (ref 5.0–8.0)

## 2016-08-21 LAB — COMPREHENSIVE METABOLIC PANEL
ALT: 17 U/L (ref 17–63)
ANION GAP: 8 (ref 5–15)
AST: 32 U/L (ref 15–41)
Albumin: 3.4 g/dL — ABNORMAL LOW (ref 3.5–5.0)
Alkaline Phosphatase: 59 U/L (ref 38–126)
BILIRUBIN TOTAL: 1.3 mg/dL — AB (ref 0.3–1.2)
BUN: 14 mg/dL (ref 6–20)
CHLORIDE: 105 mmol/L (ref 101–111)
CO2: 28 mmol/L (ref 22–32)
Calcium: 9.2 mg/dL (ref 8.9–10.3)
Creatinine, Ser: 0.84 mg/dL (ref 0.61–1.24)
GFR calc Af Amer: >60 mL/min (ref >60)
GFR calc non Af Amer: >60 mL/min (ref >60)
Glucose, Bld: 109 mg/dL — ABNORMAL HIGH (ref 65–99)
POTASSIUM: 4.3 mmol/L (ref 3.5–5.1)
SODIUM: 141 mmol/L (ref 135–145)
TOTAL PROTEIN: 7.5 g/dL (ref 6.5–8.1)

## 2016-08-21 LAB — SALICYLATE LEVEL

## 2016-08-21 LAB — RAPID URINE DRUG SCREEN, HOSP PERFORMED
AMPHETAMINES: NOT DETECTED
BARBITURATES: NOT DETECTED
Benzodiazepines: NOT DETECTED
Cocaine: NOT DETECTED
Opiates: NOT DETECTED
Tetrahydrocannabinol: NOT DETECTED

## 2016-08-21 LAB — ACETAMINOPHEN LEVEL: Acetaminophen (Tylenol), Serum: <10 ug/mL — ABNORMAL LOW (ref 10–30)

## 2016-08-21 LAB — ETHANOL: Alcohol, Ethyl (B): <5 mg/dL (ref <5)

## 2016-08-21 LAB — I-STAT CG4 LACTIC ACID, ED: Lactic Acid, Venous: 1.19 mmol/L (ref 0.5–1.9)

## 2016-08-21 MED ORDER — TETANUS-DIPHTH-ACELL PERTUSSIS 5-2.5-18.5 LF-MCG/0.5 IM SUSP
0.5000 mL | Freq: Once | INTRAMUSCULAR | Status: AC
  Administered 2016-08-22: 0.5 mL via INTRAMUSCULAR
  Filled 2016-08-21: qty 0.5
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
## 2016-08-22 ENCOUNTER — Emergency Department (HOSPITAL_COMMUNITY): Payer: Medicare Other

## 2016-08-22 DIAGNOSIS — I517 Cardiomegaly: Secondary | ICD-10-CM | POA: Diagnosis not present

## 2016-08-22 MED ORDER — LISINOPRIL 2.5 MG PO TABS
2.5000 mg | ORAL_TABLET | Freq: Every day | ORAL | Status: DC
  Administered 2016-08-22: 2.5 mg via ORAL
  Filled 2016-08-22: qty 1

## 2016-08-22 MED ORDER — VITAMIN D (ERGOCALCIFEROL) 1.25 MG (50000 UNIT) PO CAPS: 50000.0000 [IU] | ORAL_CAPSULE | ORAL | Status: DC

## 2016-08-22 MED ORDER — ESCITALOPRAM OXALATE 10 MG PO TABS
10.0000 mg | ORAL_TABLET | Freq: Every day | ORAL | Status: DC
  Administered 2016-08-22: 10 mg via ORAL
  Filled 2016-08-22: qty 1

## 2016-08-22 MED ORDER — DONEPEZIL HCL 5 MG PO TABS
5.0000 mg | ORAL_TABLET | Freq: Every morning | ORAL | Status: DC
  Administered 2016-08-22: 5 mg via ORAL
  Filled 2016-08-22: qty 1

## 2016-08-22 MED ORDER — QUETIAPINE FUMARATE 25 MG PO TABS
25.0000 mg | ORAL_TABLET | Freq: Every day | ORAL | Status: DC
  Administered 2016-08-22: 25 mg via ORAL
  Filled 2016-08-22: qty 1

## 2016-08-22 MED ORDER — ASPIRIN EC 81 MG PO TBEC
81.0000 mg | DELAYED_RELEASE_TABLET | Freq: Every day | ORAL | Status: DC
  Administered 2016-08-22: 81 mg via ORAL
  Filled 2016-08-22: qty 1

## 2016-08-22 MED ORDER — ETODOLAC 200 MG PO CAPS
200.0000 mg | ORAL_CAPSULE | Freq: Every day | ORAL | Status: DC | PRN
  Filled 2016-08-22: qty 1

## 2016-08-22 MED ORDER — ATORVASTATIN CALCIUM 10 MG PO TABS
10.0000 mg | ORAL_TABLET | Freq: Every day | ORAL | Status: DC
  Administered 2016-08-22: 10 mg via ORAL
  Filled 2016-08-22 (×2): qty 1

## 2016-08-22 NOTE — ED Notes (Signed)
Patient given a bath. Able to assist with pericare and arms. He is unable to ambulate and or stand independently. Able to stand with moderate assist and turn pivot to bed.

## 2016-08-22 NOTE — ED Notes (Signed)
Patient urinating in floor. He is A&O x4 at the present. States he didn't know where the bathroom was so he just went in the floor. Urinal given and call bell is within reach.

## 2016-08-22 NOTE — BHH Counselor (Signed)
**Note Johnathan-Identified via Obfuscation** Clinician spoke to Johnathan Mitchell at [facility] expressing they will accept the pt if he is IVC'd. Clinician spoke to Johnathan Mitchell at [facility] and noted once pt's voluntary consent is received she will call with accepting information. Clinician communicated with Dr. Effie Mitchell and he recommended to [facility] to [facility]. Clinician discussed the recommendation with Johnathan LoseElaine, RN.    Redmond Pullingreylese D Chairty Toman, MS, [facility], [facility] Triage Specialist [phone]
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
## 2016-08-22 NOTE — BHH Counselor (Addendum)
Clinician [facility] [place] [facility]  [facility] and expressed the pt's EDP will not IVC the pt. Clinician noted, from [place] that she will speak to the coordinator and see how she wants to moved forward.   Redmond Pullingreylese D Midori Dado, MS, [facility], [facility] Triage Specialist [phone]

## 2016-08-22 NOTE — ED Notes (Signed)
SL removed from LFA, catheter intact, site unremarkable, gauze dressing applied.
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
## 2016-08-22 NOTE — ED Notes (Signed)
Bed: WA15 Expected date:  Expected time:  Means of arrival:  Comments: Room 4 

## 2016-08-22 NOTE — BHH Counselor (Signed)
Clinician [facility] [place] [facility]. Pt is accepted to room/bed: 320, anytime. Attending physician: Dr. Quintin AltoVeser. Nursing report: [phone]. Updated disposition discussed with Consuella LoseElaine, RN.    Johnathan Pullingreylese D Izack Hoogland, MS, [facility], [facility] Triage Specialist [phone]

## 2016-08-22 NOTE — ED Notes (Signed)
TTS at bedside. 

## 2016-08-22 NOTE — BHH Counselor (Signed)
Consuella LoseElaine, RN completed pt's voluntary consent. Clinician faxed pt's voluntary consent form to [facility]    Redmond Pullingreylese D Amyre Segundo, MS, [facility], [facility] Triage Specialist [phone]

## 2016-08-22 NOTE — ED Notes (Signed)
Patient denies pain and is resting comfortably.  

## 2016-08-22 NOTE — BHH Counselor (Addendum)
Per [facility] at [facility] pt is on their wait list. [facility] is at capacity and the [place] location not accepting pt's because they do not have an admitting doctor.   Redmond Pullingreylese D Roselinda Bahena, MS, [facility], [facility] Triage Specialist [phone]

## 2016-08-23 DIAGNOSIS — F39 Unspecified mood [affective] disorder: Secondary | ICD-10-CM | POA: Diagnosis not present

## 2016-08-23 DIAGNOSIS — R2689 Other abnormalities of gait and mobility: Secondary | ICD-10-CM | POA: Diagnosis not present

## 2016-08-23 DIAGNOSIS — F0391 Unspecified dementia with behavioral disturbance: Secondary | ICD-10-CM | POA: Diagnosis not present

## 2016-08-23 DIAGNOSIS — R569 Unspecified convulsions: Secondary | ICD-10-CM | POA: Diagnosis not present

## 2016-08-23 DIAGNOSIS — I639 Cerebral infarction, unspecified: Secondary | ICD-10-CM | POA: Diagnosis not present

## 2016-08-23 DIAGNOSIS — G40909 Epilepsy, unspecified, not intractable, without status epilepticus: Secondary | ICD-10-CM | POA: Diagnosis not present

## 2016-08-23 DIAGNOSIS — R278 Other lack of coordination: Secondary | ICD-10-CM | POA: Diagnosis not present

## 2016-08-23 DIAGNOSIS — F331 Major depressive disorder, recurrent, moderate: Secondary | ICD-10-CM | POA: Diagnosis not present

## 2016-08-23 DIAGNOSIS — F3489 Other specified persistent mood disorders: Secondary | ICD-10-CM | POA: Diagnosis not present

## 2016-08-23 DIAGNOSIS — I672 Cerebral atherosclerosis: Secondary | ICD-10-CM | POA: Diagnosis not present

## 2016-08-23 DIAGNOSIS — F0151 Vascular dementia with behavioral disturbance: Secondary | ICD-10-CM | POA: Diagnosis not present

## 2016-08-23 DIAGNOSIS — E784 Other hyperlipidemia: Secondary | ICD-10-CM | POA: Diagnosis not present

## 2016-08-23 DIAGNOSIS — G3184 Mild cognitive impairment, so stated: Secondary | ICD-10-CM | POA: Diagnosis not present

## 2016-08-23 DIAGNOSIS — I10 Essential (primary) hypertension: Secondary | ICD-10-CM | POA: Diagnosis not present

## 2016-08-23 DIAGNOSIS — I7389 Other specified peripheral vascular diseases: Secondary | ICD-10-CM | POA: Diagnosis not present

## 2016-08-23 DIAGNOSIS — M6281 Muscle weakness (generalized): Secondary | ICD-10-CM | POA: Diagnosis not present

## 2016-08-23 DIAGNOSIS — R1311 Dysphagia, oral phase: Secondary | ICD-10-CM | POA: Diagnosis not present

## 2016-08-23 DIAGNOSIS — J45909 Unspecified asthma, uncomplicated: Secondary | ICD-10-CM | POA: Diagnosis not present

## 2016-08-23 NOTE — ED Provider Notes (Signed)
[facility] by Dr. Quintin AltoVeser.   Dione BoozeGlick, Selinda Korzeniewski, MD 08/23/16 [phone]

## 2016-08-23 NOTE — ED Notes (Signed)
Pt needs transport by [employer] vehicle with lift.  Spoke with Gorden HarmsLinda, [place], should arrive in approx 1.5 hours.

## 2016-09-02 DIAGNOSIS — F349 Persistent mood [affective] disorder, unspecified: Secondary | ICD-10-CM | POA: Diagnosis not present

## 2016-09-02 DIAGNOSIS — G309 Alzheimer's disease, unspecified: Secondary | ICD-10-CM | POA: Diagnosis not present

## 2016-09-02 DIAGNOSIS — R1311 Dysphagia, oral phase: Secondary | ICD-10-CM | POA: Diagnosis not present

## 2016-09-02 DIAGNOSIS — F39 Unspecified mood [affective] disorder: Secondary | ICD-10-CM | POA: Diagnosis not present

## 2016-09-02 DIAGNOSIS — F0281 Dementia in other diseases classified elsewhere with behavioral disturbance: Secondary | ICD-10-CM | POA: Diagnosis not present

## 2016-09-02 DIAGNOSIS — F3489 Other specified persistent mood disorders: Secondary | ICD-10-CM | POA: Diagnosis not present

## 2016-09-02 DIAGNOSIS — E784 Other hyperlipidemia: Secondary | ICD-10-CM | POA: Diagnosis not present

## 2016-09-02 DIAGNOSIS — R5381 Other malaise: Secondary | ICD-10-CM | POA: Diagnosis not present

## 2016-09-02 DIAGNOSIS — I10 Essential (primary) hypertension: Secondary | ICD-10-CM | POA: Diagnosis not present

## 2016-09-02 DIAGNOSIS — I639 Cerebral infarction, unspecified: Secondary | ICD-10-CM | POA: Diagnosis not present

## 2016-09-02 DIAGNOSIS — I7389 Other specified peripheral vascular diseases: Secondary | ICD-10-CM | POA: Diagnosis not present

## 2016-09-02 DIAGNOSIS — M6281 Muscle weakness (generalized): Secondary | ICD-10-CM | POA: Diagnosis not present

## 2016-09-02 DIAGNOSIS — F0151 Vascular dementia with behavioral disturbance: Secondary | ICD-10-CM | POA: Diagnosis not present

## 2016-09-02 DIAGNOSIS — G40909 Epilepsy, unspecified, not intractable, without status epilepticus: Secondary | ICD-10-CM | POA: Diagnosis not present

## 2016-09-02 DIAGNOSIS — F339 Major depressive disorder, recurrent, unspecified: Secondary | ICD-10-CM | POA: Diagnosis not present

## 2016-09-02 DIAGNOSIS — R278 Other lack of coordination: Secondary | ICD-10-CM | POA: Diagnosis not present

## 2016-09-02 DIAGNOSIS — R569 Unspecified convulsions: Secondary | ICD-10-CM | POA: Diagnosis not present

## 2016-09-02 DIAGNOSIS — J45909 Unspecified asthma, uncomplicated: Secondary | ICD-10-CM | POA: Diagnosis not present

## 2016-09-02 DIAGNOSIS — I672 Cerebral atherosclerosis: Secondary | ICD-10-CM | POA: Diagnosis not present

## 2016-09-02 DIAGNOSIS — F331 Major depressive disorder, recurrent, moderate: Secondary | ICD-10-CM | POA: Diagnosis not present

## 2016-09-02 DIAGNOSIS — R2689 Other abnormalities of gait and mobility: Secondary | ICD-10-CM | POA: Diagnosis not present

## 2016-09-02 DIAGNOSIS — G3184 Mild cognitive impairment, so stated: Secondary | ICD-10-CM | POA: Diagnosis not present

## 2016-09-03 DIAGNOSIS — G309 Alzheimer's disease, unspecified: Secondary | ICD-10-CM | POA: Diagnosis not present

## 2016-09-03 DIAGNOSIS — R5381 Other malaise: Secondary | ICD-10-CM | POA: Diagnosis not present

## 2016-09-03 DIAGNOSIS — F0281 Dementia in other diseases classified elsewhere with behavioral disturbance: Secondary | ICD-10-CM | POA: Diagnosis not present

## 2016-09-03 DIAGNOSIS — I10 Essential (primary) hypertension: Secondary | ICD-10-CM | POA: Diagnosis not present

## 2016-09-04 DIAGNOSIS — R5381 Other malaise: Secondary | ICD-10-CM | POA: Diagnosis not present

## 2016-09-04 DIAGNOSIS — I10 Essential (primary) hypertension: Secondary | ICD-10-CM | POA: Diagnosis not present

## 2016-09-04 DIAGNOSIS — F0281 Dementia in other diseases classified elsewhere with behavioral disturbance: Secondary | ICD-10-CM | POA: Diagnosis not present

## 2016-09-04 DIAGNOSIS — G309 Alzheimer's disease, unspecified: Secondary | ICD-10-CM | POA: Diagnosis not present

## 2016-09-20 DIAGNOSIS — E784 Other hyperlipidemia: Secondary | ICD-10-CM | POA: Diagnosis not present

## 2016-09-20 DIAGNOSIS — F0281 Dementia in other diseases classified elsewhere with behavioral disturbance: Secondary | ICD-10-CM | POA: Diagnosis not present

## 2016-09-20 DIAGNOSIS — I10 Essential (primary) hypertension: Secondary | ICD-10-CM | POA: Diagnosis not present

## 2016-09-20 DIAGNOSIS — R5381 Other malaise: Secondary | ICD-10-CM | POA: Diagnosis not present

## 2017-05-11 ENCOUNTER — Encounter (HOSPITAL_COMMUNITY): Payer: Self-pay | Admitting: *Deleted

## 2017-05-11 ENCOUNTER — Inpatient Hospital Stay (HOSPITAL_COMMUNITY): Payer: Medicare Other

## 2017-05-11 ENCOUNTER — Emergency Department (HOSPITAL_COMMUNITY): Payer: Medicare Other

## 2017-05-11 ENCOUNTER — Inpatient Hospital Stay (HOSPITAL_COMMUNITY)
Admission: EM | Admit: 2017-05-11 | Discharge: 2017-05-16 | DRG: 480 | Disposition: A | Discharge status: Skilled Nursing Facility | Payer: Medicare Other | Attending: Internal Medicine | Admitting: Internal Medicine

## 2017-05-11 DIAGNOSIS — S72302A Unspecified fracture of shaft of left femur, initial encounter for closed fracture: Secondary | ICD-10-CM | POA: Diagnosis not present

## 2017-05-11 DIAGNOSIS — R531 Weakness: Secondary | ICD-10-CM | POA: Diagnosis not present

## 2017-05-11 DIAGNOSIS — I6789 Other cerebrovascular disease: Secondary | ICD-10-CM | POA: Diagnosis not present

## 2017-05-11 DIAGNOSIS — S7222XA Displaced subtrochanteric fracture of left femur, initial encounter for closed fracture: Secondary | ICD-10-CM | POA: Diagnosis not present

## 2017-05-11 DIAGNOSIS — I7772 Dissection of iliac artery: Secondary | ICD-10-CM | POA: Diagnosis present

## 2017-05-11 DIAGNOSIS — R64 Cachexia: Secondary | ICD-10-CM | POA: Diagnosis present

## 2017-05-11 DIAGNOSIS — I6300 Cerebral infarction due to thrombosis of unspecified precerebral artery: Secondary | ICD-10-CM | POA: Diagnosis not present

## 2017-05-11 DIAGNOSIS — F03918 Unspecified dementia, unspecified severity, with other behavioral disturbance: Secondary | ICD-10-CM | POA: Diagnosis present

## 2017-05-11 DIAGNOSIS — M810 Age-related osteoporosis without current pathological fracture: Secondary | ICD-10-CM | POA: Diagnosis present

## 2017-05-11 DIAGNOSIS — S7292XA Unspecified fracture of left femur, initial encounter for closed fracture: Secondary | ICD-10-CM

## 2017-05-11 DIAGNOSIS — I10 Essential (primary) hypertension: Secondary | ICD-10-CM | POA: Diagnosis not present

## 2017-05-11 DIAGNOSIS — F419 Anxiety disorder, unspecified: Secondary | ICD-10-CM | POA: Diagnosis present

## 2017-05-11 DIAGNOSIS — S72402D Unspecified fracture of lower end of left femur, subsequent encounter for closed fracture with routine healing: Secondary | ICD-10-CM | POA: Diagnosis not present

## 2017-05-11 DIAGNOSIS — D6959 Other secondary thrombocytopenia: Secondary | ICD-10-CM | POA: Diagnosis present

## 2017-05-11 DIAGNOSIS — I959 Hypotension, unspecified: Secondary | ICD-10-CM

## 2017-05-11 DIAGNOSIS — F0391 Unspecified dementia with behavioral disturbance: Secondary | ICD-10-CM | POA: Diagnosis present

## 2017-05-11 DIAGNOSIS — R278 Other lack of coordination: Secondary | ICD-10-CM | POA: Diagnosis not present

## 2017-05-11 DIAGNOSIS — G459 Transient cerebral ischemic attack, unspecified: Secondary | ICD-10-CM | POA: Diagnosis not present

## 2017-05-11 DIAGNOSIS — Y92003 Bedroom of unspecified non-institutional (private) residence as the place of occurrence of the external cause: Secondary | ICD-10-CM | POA: Diagnosis not present

## 2017-05-11 DIAGNOSIS — W010XXA Fall on same level from slipping, tripping and stumbling without subsequent striking against object, initial encounter: Secondary | ICD-10-CM | POA: Diagnosis present

## 2017-05-11 DIAGNOSIS — Z880 Allergy status to penicillin: Secondary | ICD-10-CM

## 2017-05-11 DIAGNOSIS — R488 Other symbolic dysfunctions: Secondary | ICD-10-CM | POA: Diagnosis not present

## 2017-05-11 DIAGNOSIS — I69351 Hemiplegia and hemiparesis following cerebral infarction affecting right dominant side: Secondary | ICD-10-CM

## 2017-05-11 DIAGNOSIS — Z9181 History of falling: Secondary | ICD-10-CM | POA: Diagnosis not present

## 2017-05-11 DIAGNOSIS — I712 Thoracic aortic aneurysm, without rupture: Secondary | ICD-10-CM | POA: Diagnosis not present

## 2017-05-11 DIAGNOSIS — R2689 Other abnormalities of gait and mobility: Secondary | ICD-10-CM | POA: Diagnosis not present

## 2017-05-11 DIAGNOSIS — R4701 Aphasia: Secondary | ICD-10-CM | POA: Diagnosis present

## 2017-05-11 DIAGNOSIS — T148XXA Other injury of unspecified body region, initial encounter: Secondary | ICD-10-CM

## 2017-05-11 DIAGNOSIS — R1312 Dysphagia, oropharyngeal phase: Secondary | ICD-10-CM | POA: Diagnosis not present

## 2017-05-11 DIAGNOSIS — G8190 Hemiplegia, unspecified affecting unspecified side: Secondary | ICD-10-CM | POA: Diagnosis not present

## 2017-05-11 DIAGNOSIS — G40909 Epilepsy, unspecified, not intractable, without status epilepticus: Secondary | ICD-10-CM | POA: Diagnosis present

## 2017-05-11 DIAGNOSIS — G464 Cerebellar stroke syndrome: Secondary | ICD-10-CM | POA: Diagnosis not present

## 2017-05-11 DIAGNOSIS — Y9389 Activity, other specified: Secondary | ICD-10-CM

## 2017-05-11 DIAGNOSIS — N179 Acute kidney failure, unspecified: Secondary | ICD-10-CM

## 2017-05-11 DIAGNOSIS — E785 Hyperlipidemia, unspecified: Secondary | ICD-10-CM | POA: Diagnosis present

## 2017-05-11 DIAGNOSIS — Z7982 Long term (current) use of aspirin: Secondary | ICD-10-CM | POA: Diagnosis not present

## 2017-05-11 DIAGNOSIS — J449 Chronic obstructive pulmonary disease, unspecified: Secondary | ICD-10-CM | POA: Diagnosis not present

## 2017-05-11 DIAGNOSIS — Z6824 Body mass index (BMI) 24.0-24.9, adult: Secondary | ICD-10-CM

## 2017-05-11 DIAGNOSIS — S7002XA Contusion of left hip, initial encounter: Secondary | ICD-10-CM | POA: Diagnosis not present

## 2017-05-11 DIAGNOSIS — R471 Dysarthria and anarthria: Secondary | ICD-10-CM | POA: Diagnosis present

## 2017-05-11 DIAGNOSIS — S7290XA Unspecified fracture of unspecified femur, initial encounter for closed fracture: Secondary | ICD-10-CM

## 2017-05-11 DIAGNOSIS — Z8349 Family history of other endocrine, nutritional and metabolic diseases: Secondary | ICD-10-CM

## 2017-05-11 DIAGNOSIS — R1084 Generalized abdominal pain: Secondary | ICD-10-CM | POA: Diagnosis not present

## 2017-05-11 DIAGNOSIS — I6522 Occlusion and stenosis of left carotid artery: Secondary | ICD-10-CM | POA: Diagnosis not present

## 2017-05-11 DIAGNOSIS — Z993 Dependence on wheelchair: Secondary | ICD-10-CM

## 2017-05-11 DIAGNOSIS — Z87891 Personal history of nicotine dependence: Secondary | ICD-10-CM | POA: Diagnosis not present

## 2017-05-11 DIAGNOSIS — D62 Acute posthemorrhagic anemia: Secondary | ICD-10-CM | POA: Diagnosis not present

## 2017-05-11 DIAGNOSIS — I639 Cerebral infarction, unspecified: Secondary | ICD-10-CM | POA: Diagnosis present

## 2017-05-11 DIAGNOSIS — I723 Aneurysm of iliac artery: Secondary | ICD-10-CM | POA: Diagnosis present

## 2017-05-11 DIAGNOSIS — Z79899 Other long term (current) drug therapy: Secondary | ICD-10-CM

## 2017-05-11 DIAGNOSIS — R52 Pain, unspecified: Secondary | ICD-10-CM

## 2017-05-11 DIAGNOSIS — M79606 Pain in leg, unspecified: Secondary | ICD-10-CM | POA: Diagnosis not present

## 2017-05-11 DIAGNOSIS — S7222XD Displaced subtrochanteric fracture of left femur, subsequent encounter for closed fracture with routine healing: Secondary | ICD-10-CM | POA: Diagnosis not present

## 2017-05-11 DIAGNOSIS — D696 Thrombocytopenia, unspecified: Secondary | ICD-10-CM | POA: Diagnosis not present

## 2017-05-11 DIAGNOSIS — W19XXXA Unspecified fall, initial encounter: Secondary | ICD-10-CM

## 2017-05-11 DIAGNOSIS — S72332A Displaced oblique fracture of shaft of left femur, initial encounter for closed fracture: Secondary | ICD-10-CM | POA: Diagnosis not present

## 2017-05-11 DIAGNOSIS — M6281 Muscle weakness (generalized): Secondary | ICD-10-CM | POA: Diagnosis not present

## 2017-05-11 DIAGNOSIS — Z8249 Family history of ischemic heart disease and other diseases of the circulatory system: Secondary | ICD-10-CM

## 2017-05-11 LAB — COMPREHENSIVE METABOLIC PANEL
ALBUMIN: 3.4 g/dL — AB (ref 3.5–5.0)
ALK PHOS: 59 U/L (ref 38–126)
ALT: 25 U/L (ref 17–63)
ANION GAP: 12 (ref 5–15)
AST: 45 U/L — ABNORMAL HIGH (ref 15–41)
BUN: 17 mg/dL (ref 6–20)
CHLORIDE: 103 mmol/L (ref 101–111)
CO2: 21 mmol/L — AB (ref 22–32)
Calcium: 9.4 mg/dL (ref 8.9–10.3)
Creatinine, Ser: 1.48 mg/dL — ABNORMAL HIGH (ref 0.61–1.24)
GFR calc non Af Amer: 43 mL/min — ABNORMAL LOW (ref >60)
GFR, EST AFRICAN AMERICAN: 50 mL/min — AB (ref >60)
GLUCOSE: 165 mg/dL — AB (ref 65–99)
Potassium: 5.1 mmol/L (ref 3.5–5.1)
SODIUM: 136 mmol/L (ref 135–145)
Total Bilirubin: 0.6 mg/dL (ref 0.3–1.2)
Total Protein: 7.5 g/dL (ref 6.5–8.1)

## 2017-05-11 LAB — CBC
HEMATOCRIT: 34.8 % — AB (ref 39.0–52.0)
HEMOGLOBIN: 11.7 g/dL — AB (ref 13.0–17.0)
MCH: 27.1 pg (ref 26.0–34.0)
MCHC: 33.6 g/dL (ref 30.0–36.0)
MCV: 80.6 fL (ref 78.0–100.0)
Platelets: 203 10*3/uL (ref 150–400)
RBC: 4.32 MIL/uL (ref 4.22–5.81)
RDW: 15.3 % (ref 11.5–15.5)
WBC: 12.6 10*3/uL — AB (ref 4.0–10.5)

## 2017-05-11 LAB — URINALYSIS, ROUTINE W REFLEX MICROSCOPIC
Bacteria, UA: NONE SEEN
Bilirubin Urine: NEGATIVE
Glucose, UA: NEGATIVE mg/dL
Ketones, ur: NEGATIVE mg/dL
Leukocytes, UA: NEGATIVE
NITRITE: NEGATIVE
PROTEIN: 30 mg/dL — AB
RBC / HPF: NONE SEEN RBC/hpf (ref 0–5)
pH: 5 (ref 5.0–8.0)

## 2017-05-11 LAB — TROPONIN I: Troponin I: <0.03 ng/mL (ref <0.03)

## 2017-05-11 LAB — I-STAT CHEM 8, ED
BUN: 20 mg/dL (ref 6–20)
CREATININE: 1.3 mg/dL — AB (ref 0.61–1.24)
Calcium, Ion: 1.15 mmol/L (ref 1.15–1.40)
Chloride: 104 mmol/L (ref 101–111)
GLUCOSE: 174 mg/dL — AB (ref 65–99)
HEMATOCRIT: 37 % — AB (ref 39.0–52.0)
Hemoglobin: 12.6 g/dL — ABNORMAL LOW (ref 13.0–17.0)
POTASSIUM: 5.2 mmol/L — AB (ref 3.5–5.1)
Sodium: 137 mmol/L (ref 135–145)
TCO2: 23 mmol/L (ref 22–32)

## 2017-05-11 LAB — RAPID URINE DRUG SCREEN, HOSP PERFORMED
AMPHETAMINES: NOT DETECTED
Barbiturates: NOT DETECTED
Benzodiazepines: NOT DETECTED
Cocaine: NOT DETECTED
OPIATES: NOT DETECTED
TETRAHYDROCANNABINOL: NOT DETECTED

## 2017-05-11 LAB — DIFFERENTIAL
BASOS PCT: 0 %
Basophils Absolute: 0 10*3/uL (ref 0.0–0.1)
EOS PCT: 0 %
Eosinophils Absolute: 0 10*3/uL (ref 0.0–0.7)
LYMPHS PCT: 5 %
Lymphs Abs: 0.6 10*3/uL — ABNORMAL LOW (ref 0.7–4.0)
MONO ABS: 1.1 10*3/uL — AB (ref 0.1–1.0)
Monocytes Relative: 9 %
NEUTROS PCT: 86 %
Neutro Abs: 10.9 10*3/uL — ABNORMAL HIGH (ref 1.7–7.7)

## 2017-05-11 LAB — ETHANOL: Alcohol, Ethyl (B): <10 mg/dL (ref <10)

## 2017-05-11 LAB — PROTIME-INR
INR: 1.06
Prothrombin Time: 13.7 seconds (ref 11.4–15.2)

## 2017-05-11 LAB — APTT: aPTT: 30 seconds (ref 24–36)

## 2017-05-11 LAB — I-STAT TROPONIN, ED: Troponin i, poc: 0.01 ng/mL (ref 0.00–0.08)

## 2017-05-11 LAB — CBG MONITORING, ED: GLUCOSE-CAPILLARY: 165 mg/dL — AB (ref 65–99)

## 2017-05-11 MED ORDER — ACETAMINOPHEN 650 MG RE SUPP: 650.0000 mg | RECTAL | Status: DC | PRN

## 2017-05-11 MED ORDER — STROKE: EARLY STAGES OF RECOVERY BOOK
Freq: Once | Status: AC
  Administered 2017-05-16: 16:00:00
  Filled 2017-05-11 (×2): qty 1

## 2017-05-11 MED ORDER — ASPIRIN 300 MG RE SUPP
300.0000 mg | Freq: Every day | RECTAL | Status: DC
  Filled 2017-05-11 (×2): qty 1

## 2017-05-11 MED ORDER — FENTANYL CITRATE (PF) 100 MCG/2ML IJ SOLN
50.0000 ug | Freq: Once | INTRAMUSCULAR | Status: AC
Start: 2017-05-11 — End: 2017-05-11
  Administered 2017-05-11: 50 ug via INTRAVENOUS

## 2017-05-11 MED ORDER — ESCITALOPRAM OXALATE 10 MG PO TABS
10.0000 mg | ORAL_TABLET | Freq: Every day | ORAL | Status: DC
  Administered 2017-05-12 – 2017-05-16 (×5): 10 mg via ORAL
  Filled 2017-05-11 (×5): qty 1

## 2017-05-11 MED ORDER — ACETAMINOPHEN 160 MG/5ML PO SOLN: 650.0000 mg | ORAL | Status: DC | PRN

## 2017-05-11 MED ORDER — ASPIRIN 325 MG PO TABS
325.0000 mg | ORAL_TABLET | Freq: Every day | ORAL | Status: DC
  Administered 2017-05-12 – 2017-05-16 (×5): 325 mg via ORAL
  Filled 2017-05-11 (×5): qty 1

## 2017-05-11 MED ORDER — IOPAMIDOL (ISOVUE-370) INJECTION 76%
INTRAVENOUS | Status: AC
  Filled 2017-05-11: qty 100

## 2017-05-11 MED ORDER — ACETAMINOPHEN 325 MG PO TABS: 650.0000 mg | ORAL_TABLET | ORAL | Status: DC | PRN

## 2017-05-11 MED ORDER — SODIUM CHLORIDE 0.9 % IV SOLN
INTRAVENOUS | Status: AC
  Administered 2017-05-11: 23:00:00 via INTRAVENOUS

## 2017-05-11 MED ORDER — IOPAMIDOL (ISOVUE-370) INJECTION 76%
100.0000 mL | Freq: Once | INTRAVENOUS | Status: AC | PRN
  Administered 2017-05-11: 100 mL via INTRAVENOUS

## 2017-05-11 MED ORDER — ATORVASTATIN CALCIUM 80 MG PO TABS
80.0000 mg | ORAL_TABLET | Freq: Every day | ORAL | Status: DC
  Administered 2017-05-12 – 2017-05-16 (×5): 80 mg via ORAL
  Filled 2017-05-11 (×5): qty 1

## 2017-05-11 MED ORDER — STROKE: EARLY STAGES OF RECOVERY BOOK
Freq: Once | Status: AC
  Administered 2017-05-11: 23:00:00
  Filled 2017-05-11: qty 1

## 2017-05-11 MED ORDER — FENTANYL CITRATE (PF) 100 MCG/2ML IJ SOLN
INTRAMUSCULAR | Status: AC
  Filled 2017-05-11: qty 2

## 2017-05-11 MED ORDER — DIVALPROEX SODIUM ER 500 MG PO TB24
500.0000 mg | ORAL_TABLET | Freq: Every day | ORAL | Status: DC
  Administered 2017-05-11 – 2017-05-15 (×5): 500 mg via ORAL
  Filled 2017-05-11 (×6): qty 1

## 2017-05-11 MED ORDER — IOPAMIDOL (ISOVUE-300) INJECTION 61%
INTRAVENOUS | Status: AC
  Filled 2017-05-11: qty 100

## 2017-05-11 NOTE — H&P (Signed)
TRH H&P   Patient Demographics:    Jaquell Seddon, is a 83 y.o. male  MRN: [id number]   DOB - 05-Mar-1934  Admit Date - 05/11/2017  Outpatient Primary MD for the patient is System, Provider Not In  Referring MD/NP/PA: Alona Bene  Outpatient Specialists:      Patient coming from:   Chief Complaint  Patient presents with  . Trauma  . Code Stroke      HPI:    Muaz Shorey  is a 83 y.o. male, Asthma, Dementia, hypertension, CVA, seizure do apparently  C/o fall earlier today.  Pt was having slurred speech and bilateral arm weakness w hypotension and therefore presented to ED.   In ED found to have left traumatic femur fracture. ED consulted orthopedics  CT brain IMPRESSION: 1. No acute intracranial abnormality. Pronounced chronic encephalomalacia in the left hemisphere with left deep gray matter and brainstem Wallerian degeneration appears stable since 2018. 2. No acute intracranial hemorrhage. ASPECTS is 10 (chronic left hemisphere encephalomalacia). 3. Study discussed by telephone with Dr. Caryl Pina on 05/11/2017 at 12:01 .  CT chest / abd /pelvis IMPRESSION: No acute intra-abdominal or intrapelvic abnormalities.  Displaced comminuted proximal LEFT femoral fracture extending into lesser trochanter.  Degenerative changes of the thoracolumbar spine.  Advanced degenerative changes of both shoulders and RIGHT hip joint.  COPD changes with scattered subsegmental atelectasis.  Scattered atherosclerotic disease including aneurysmal dilatation of ascending thoracic aorta, distal aortic arch, and BILATERAL internal iliac arteries, recommendation below.  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  Recommend semi-annual imaging followup by CTA or MRA and referral to cardiothoracic surgery if not already obtained  L  femur IMPRESSION: Displaced oblique fracture of proximal LEFT femoral diaphysis extending into lesser trochanter.  Osseous demineralization with advanced degenerative changes of the LEFT knee joint.  Vascular Dr. Randie Heinz consulted on the ? Dissection of right common iliac artery  And no surgical intervention needed at thsi time.   Pt will be admitted for CVA, left femur fracture   Review of systems:    In addition to the HPI above,  No Fever-chills, No Headache, No changes with Vision or hearing, No problems swallowing food or Liquids, No Chest pain, Cough or Shortness of Breath, No Abdominal pain, No Nausea or Vommitting, Bowel movements are regular, No Blood in stool or Urine, No dysuria, No new skin rashes or bruises,   No recent weight gain or loss, No polyuria, polydypsia or polyphagia, No significant Mental Stressors.  A full 10 point Review of Systems was done, except as stated above, all other Review of Systems were negative.   With Past History of the following :    Past Medical History:  Diagnosis Date  . Asthma   . Dementia   . Hypertension   . Seizures (HCC)   . Stroke [facility])  Past Surgical History:  Procedure Laterality Date  . BACK SURGERY    . KNEE SURGERY        Social History:     Social History   Tobacco Use  . Smoking status: Former Games developer  . Smokeless tobacco: Never Used  Substance Use Topics  . Alcohol use: No     Lives - at home  Mobility -   Walks by self   Family History :     Family History  Problem Relation Age of Onset  . Hyperlipidemia Mother   . Hypertension Mother       Home Medications:   Prior to Admission medications   Medication Sig Start Date End Date Taking? Authorizing Provider  aspirin 81 MG tablet Take 1 tablet (81 mg total) by mouth daily. 02/02/14  Yes Elgergawy, Leana Roe, MD  atorvastatin (LIPITOR) 20 MG tablet Take 10 mg by mouth at bedtime.   Yes [provider]  Cholecalciferol  (VITAMIN D) 2000 units CAPS Take 2,000 Units by mouth daily.   Yes [provider]  divalproex (DEPAKOTE ER) 500 MG 24 hr tablet Take 500 mg by mouth at bedtime.   Yes [provider]  escitalopram (LEXAPRO) 10 MG tablet Take 10 mg by mouth daily.   Yes [provider]  lisinopril (PRINIVIL,ZESTRIL) 5 MG tablet Take 2.5 mg by mouth daily.   Yes [provider]  traMADol (ULTRAM) 50 MG tablet Take 1 tablet (50 mg total) by mouth every 6 (six) hours as needed. Patient not taking: Reported on 08/21/2016 06/25/13   Elwin Mocha, MD     Allergies:     Allergies  Allergen Reactions  . Penicillins Hives and Swelling    Patient couldn't answer any questions     Physical Exam:   Vitals  Blood pressure 112/80, pulse 87, temperature (!) 96.9 F (36.1 C), resp. rate 15, height 5\' 6"  (1.676 m), weight 68 kg (150 lb), SpO2 100 %.   1. General  lying in bed in NAD,    2. Normal affect and insight, Not Suicidal or Homicidal, Awake Alert, Oriented X 1  3. No F.N deficits, ALL C.Nerves Intact, Strength 5/5 all 4 extremities, Sensation intact all 4 extremities, Plantars down going.  4. Ears and Eyes appear Normal, Conjunctivae clear, PERRLA. Moist Oral Mucosa.  5. Supple Neck, No JVD, No cervical lymphadenopathy appriciated, No Carotid Bruits.  6. Symmetrical Chest wall movement, Good air movement bilaterally, CTAB.  7. RRR, No Gallops, Rubs or Murmurs, No Parasternal Heave.  8. Positive Bowel Sounds, Abdomen Soft, No tenderness, No organomegaly appriciated,No rebound -guarding or rigidity.  9.  No Cyanosis, Normal Skin Turgor, No Skin Rash or Bruise.  10. Good muscle tone,  joints appear normal , no effusions, Normal ROM.  11. No Palpable Lymph Nodes in Neck or Axillae  Speech fluent, grip 5/5 bilaterally   Data Review:    CBC Recent Labs  Lab 05/11/17 1135 05/11/17 1146  WBC 12.6*  --   HGB 11.7* 12.6*  HCT 34.8* 37.0*  PLT 203  --   MCV  80.6  --   MCH 27.1  --   MCHC 33.6  --   RDW 15.3  --   LYMPHSABS 0.6*  --   MONOABS 1.1*  --   EOSABS 0.0  --   BASOSABS 0.0  --    ------------------------------------------------------------------------------------------------------------------  Chemistries  Recent Labs  Lab 05/11/17 1135 05/11/17 1146  NA 136 137  K 5.1 5.2*  CL 103 104  CO2 21*  --   GLUCOSE 165* 174*  BUN 17 20  CREATININE 1.48* 1.30*  CALCIUM 9.4  --   AST 45*  --   ALT 25  --   ALKPHOS 59  --   BILITOT 0.6  --    ------------------------------------------------------------------------------------------------------------------ estimated creatinine clearance is 40.9 mL/min (A) (by C-G formula based on SCr of 1.3 mg/dL (H)). ------------------------------------------------------------------------------------------------------------------ No results for input(s): TSH, T4TOTAL, T3FREE, THYROIDAB in the last 72 hours.  Invalid input(s): FREET3  Coagulation profile Recent Labs  Lab 05/11/17 1135  INR 1.06   ------------------------------------------------------------------------------------------------------------------- No results for input(s): DDIMER in the last 72 hours. -------------------------------------------------------------------------------------------------------------------  Cardiac Enzymes No results for input(s): CKMB, TROPONINI, MYOGLOBIN in the last 168 hours.  Invalid input(s): CK ------------------------------------------------------------------------------------------------------------------    Component Value Date/Time   BNP 17.9 02/01/2014 1200     ---------------------------------------------------------------------------------------------------------------  Urinalysis    Component Value Date/Time   COLORURINE YELLOW 08/21/2016 2115   APPEARANCEUR CLEAR 08/21/2016 2115   LABSPEC 1.023 08/21/2016 2115   PHURINE 5.0 08/21/2016 2115   GLUCOSEU NEGATIVE  08/21/2016 2115   HGBUR MODERATE (A) 08/21/2016 2115   BILIRUBINUR NEGATIVE 08/21/2016 2115   KETONESUR NEGATIVE 08/21/2016 2115   PROTEINUR 100 (A) 08/21/2016 2115   UROBILINOGEN 1.0 02/01/2014 1507   NITRITE NEGATIVE 08/21/2016 2115   LEUKOCYTESUR NEGATIVE 08/21/2016 2115    ----------------------------------------------------------------------------------------------------------------   Imaging Results:    Ct Chest W Contrast  Result Date: 05/11/2017 CLINICAL DATA:  TIA, hypotension, femur fracture, acute generalized abdominal pain, history hypertension, dementia, asthma, seizures EXAM: CT CHEST, ABDOMEN, AND PELVIS WITH CONTRAST TECHNIQUE: Multidetector CT imaging of the chest, abdomen and pelvis was performed following the standard protocol during bolus administration of intravenous contrast. Sagittal and coronal MPR images reconstructed from axial data set. CONTRAST:  100mL ISOVUE-370 IOPAMIDOL (ISOVUE-370) INJECTION 76% IV. No oral contrast administered. COMPARISON:  None FINDINGS: CT CHEST FINDINGS Cardiovascular: Aneurysmal dilatation of the ascending thoracic aorta 4.1 cm diameter image 35. Additional dilatation of the distal aortic arch 4.1 cm diameter. No evidence of aortic dissection. Pulmonary arteries grossly patent on non targeted exam. No pericardial effusion. Mild dilatation of the RIGHT ventricle. Atherosclerotic calcifications of the coronary arteries and descending thoracic aorta. Mediastinum/Nodes: Esophagus unremarkable. Base of cervical region normal appearance. No thoracic adenopathy. Lungs/Pleura: Lungs emphysematous with scattered atelectasis in medial RIGHT lower lobe and at both lung bases. No pulmonary infiltrate, pleural effusion or pneumothorax. Musculoskeletal: Osseous demineralization. Advanced degenerative changes of BILATERAL glenohumeral joints. Sclerotic lesion within proximal LEFT humerus, question enchondroma versus bone infarct. CT ABDOMEN PELVIS FINDINGS  Hepatobiliary: Gallbladder and liver normal appearance Pancreas: Normal appearance Spleen: Normal appearance Adrenals/Urinary Tract: Adrenal glands, kidneys, ureters, and bladder normal appearance Stomach/Bowel: Normal appendix in RIGHT pelvis. Stomach and bowel loops grossly unremarkable within limitations of patient motion and scattered streak artifacts. Vascular/Lymphatic: Atherosclerotic calcification and tortuosity of abdominal aorta. Aorta normal caliber. Short segment of dissection identified at dilated RIGHT common iliac artery, 19 mm diameter. 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 No adenopathy. Reproductive: Mild prostatic enlargement. Seminal vesicles unremarkable. Other: Small BILATERAL inguinal hernias containing fat. No free air or free fluid. No definite acute inflammatory process. Musculoskeletal: Diffuse osseous demineralization. Scattered degenerative disc disease changes of the thoracolumbar spine most severe at L5-S1 with mild retrolisthesis. Comminuted subtrochanteric fracture of the proximal LEFT femur extending into the lesser trochanter. Surrounding soft tissue edema/hemorrhage advanced degenerative changes of the RIGHT  hip joint. IMPRESSION: No acute intra-abdominal or intrapelvic abnormalities. Displaced comminuted proximal LEFT femoral fracture extending into lesser trochanter. Degenerative changes of the thoracolumbar spine. Advanced degenerative changes of both shoulders and RIGHT hip joint. COPD changes with scattered subsegmental atelectasis. Scattered atherosclerotic disease including aneurysmal dilatation of ascending thoracic aorta, distal aortic arch, and BILATERAL internal iliac arteries, recommendation below. 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 Recommend semi-annual imaging followup by CTA or MRA and referral to cardiothoracic surgery if not already obtained. This  recommendation follows 2010 ACCF/AHA/AATS/ACR/ASA/SCA/SCAI/SIR/STS/SVM Guidelines for the Diagnosis and Management of Patients With Thoracic Aortic Disease. Circulation. 2010; 121: Z610-R60 Electronically Signed   By: Ulyses Southward M.D.   On: 05/11/2017 12:48   Ct Abdomen Pelvis W Contrast  Result Date: 05/11/2017 CLINICAL DATA:  TIA, hypotension, femur fracture, acute generalized abdominal pain, history hypertension, dementia, asthma, seizures EXAM: CT CHEST, ABDOMEN, AND PELVIS WITH CONTRAST TECHNIQUE: Multidetector CT imaging of the chest, abdomen and pelvis was performed following the standard protocol during bolus administration of intravenous contrast. Sagittal and coronal MPR images reconstructed from axial data set. CONTRAST:  ISOVUE-370 IOPAMIDOL (ISOVUE-370) INJECTION 76% IV. No oral contrast administered. COMPARISON:  None FINDINGS: CT CHEST FINDINGS Cardiovascular: Aneurysmal dilatation of the ascending thoracic aorta 4.1 cm diameter image 35. Additional dilatation of the distal aortic arch 4.1 cm diameter. No evidence of aortic dissection. Pulmonary arteries grossly patent on non targeted exam. No pericardial effusion. Mild dilatation of the RIGHT ventricle. Atherosclerotic calcifications of the coronary arteries and descending thoracic aorta. Mediastinum/Nodes: Esophagus unremarkable. Base of cervical region normal appearance. No thoracic adenopathy. Lungs/Pleura: Lungs emphysematous with scattered atelectasis in medial RIGHT lower lobe and at both lung bases. No pulmonary infiltrate, pleural effusion or pneumothorax. Musculoskeletal: Osseous demineralization. Advanced degenerative changes of BILATERAL glenohumeral joints. Sclerotic lesion within proximal LEFT humerus, question enchondroma versus bone infarct. CT ABDOMEN PELVIS FINDINGS Hepatobiliary: Gallbladder and liver normal appearance Pancreas: Normal appearance Spleen: Normal appearance Adrenals/Urinary Tract: Adrenal glands, kidneys,  ureters, and bladder normal appearance Stomach/Bowel: Normal appendix in RIGHT pelvis. Stomach and bowel loops grossly unremarkable within limitations of patient motion and scattered streak artifacts. Vascular/Lymphatic: Atherosclerotic calcification and tortuosity of abdominal aorta. Aorta normal caliber. Short segment of dissection identified at dilated RIGHT common iliac artery, 19 mm diameter. 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 No adenopathy. Reproductive: Mild prostatic enlargement. Seminal vesicles unremarkable. Other: Small BILATERAL inguinal hernias containing fat. No free air or free fluid. No definite acute inflammatory process. Musculoskeletal: Diffuse osseous demineralization. Scattered degenerative disc disease changes of the thoracolumbar spine most severe at L5-S1 with mild retrolisthesis. Comminuted subtrochanteric fracture of the proximal LEFT femur extending into the lesser trochanter. Surrounding soft tissue edema/hemorrhage advanced degenerative changes of the RIGHT hip joint. IMPRESSION: No acute intra-abdominal or intrapelvic abnormalities. Displaced comminuted proximal LEFT femoral fracture extending into lesser trochanter. Degenerative changes of the thoracolumbar spine. Advanced degenerative changes of both shoulders and RIGHT hip joint. COPD changes with scattered subsegmental atelectasis. Scattered atherosclerotic disease including aneurysmal dilatation of ascending thoracic aorta, distal aortic arch, and BILATERAL internal iliac arteries, recommendation below. 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 Recommend semi-annual imaging followup by CTA or MRA and referral to cardiothoracic surgery if not already obtained. This recommendation follows 2010 ACCF/AHA/AATS/ACR/ASA/SCA/SCAI/SIR/STS/SVM Guidelines for the Diagnosis and Management of Patients With Thoracic Aortic Disease.  Circulation. 2010; 121: [id number] Electronically Signed   By: Loraine Leriche  Tyron Russell M.D.   On: 05/11/2017 12:48   Dg Femur Port Min 2 Views Left  Result Date: 05/11/2017 CLINICAL DATA:  Larey Seat today poor historian, history hypertension, asthma, stroke, dementia, former smoker EXAM: LEFT FEMUR PORTABLE 2 VIEWS COMPARISON:  None FINDINGS: Osseous demineralization. Displaced oblique fracture of the proximal LEFT femoral diaphysis extending to involve the lesser trochanter. LEFT femoral head, neck and greater trochanter appear intact. LEFT hip joint space preserved without dislocation. Advanced tricompartmental osteoarthritic changes of the LEFT knee with joint space narrowing and spur formation. Calcified debris posteriorly at the LEFT knee joint. Distal LEFT femur intact. IMPRESSION: Displaced oblique fracture of proximal LEFT femoral diaphysis extending into lesser trochanter. Osseous demineralization with advanced degenerative changes of the LEFT knee joint. Electronically Signed   By: Ulyses Southward M.D.   On: 05/11/2017 13:33   Ct Head Code Stroke Wo Contrast  Result Date: 05/11/2017 CLINICAL DATA:  Code stroke. 83 year old male with right side weakness. EXAM: CT HEAD WITHOUT CONTRAST TECHNIQUE: Contiguous axial images were obtained from the base of the skull through the vertex without intravenous contrast. COMPARISON:  Head CTs 08/21/2016 and earlier. FINDINGS: Brain: Chronic left MCA territory encephalomalacia, confluent left hemisphere white matter hypodensity, and ex vacuo enlargement of the left lateral ventricle appears stable since July 2018. There is associated confluent hypodensity in the left cauda thalamic groove and posterior limb left internal capsule with evidence of Wallerian degeneration at the left midbrain and brainstem. Moderate contralateral right hemisphere white matter hypodensity also appears stable. No midline shift, ventriculomegaly, mass effect, evidence of mass lesion, intracranial hemorrhage or  evidence of cortically based acute infarction. No other cortical encephalomalacia identified. Vascular: Calcified atherosclerosis at the skull base. Intracranial artery tortuosity. No suspicious intracranial vascular hyperdensity. Skull: No acute osseous abnormality identified. Partially visible advanced upper cervical spine degeneration. Sinuses/Orbits: Visualized paranasal sinuses and mastoids are stable and well pneumatized. Other: Visualized orbits and scalp soft tissues are within normal limits. ASPECTS [facility] Stroke Program Early CT Score) Total score (0-10 with 10 being normal): 10 (chronic left hemisphere encephalomalacia). IMPRESSION: 1. No acute intracranial abnormality. Pronounced chronic encephalomalacia in the left hemisphere with left deep gray matter and brainstem Wallerian degeneration appears stable since 2018. 2. No acute intracranial hemorrhage. ASPECTS is 10 (chronic left hemisphere encephalomalacia). 3. Study discussed by telephone with Dr. Caryl Pina on 05/11/2017 at 12:01 . Electronically Signed   By: Odessa Fleming M.D.   On: 05/11/2017 12:02       Assessment & Plan:    Principal Problem:   Stroke [facility]) Active Problems:   Benign essential HTN   Femur fracture (HCC)   CVA MRI brain CTA brain/ neck pending Cardiac echo NPO til passes RN swallow Check hga1c, lipid Aspirin Lipitor ? Plavix (defer to neurology) Neurology consulted by ED, appreciate input  Femur fracture (left) Orthopedics consulted by ED  R iliac dissection Appreciate vascular consult  Hypertension HOLD lisinopril Permissive hypertension  Anxiety Cont Lexapro Cont depakote     DVT Prophylaxis  SCDs  AM Labs Ordered, also please review Full Orders  Family Communication: Admission, patients condition and plan of care including tests being ordered have been discussed with the patient  who indicate understanding and agree with the plan and Code Status.  Code Status FULL CODE  Likely DC to   TBD  Condition GUARDED   Consults called: neurology by ED, orthopedics by ED, vascular by ED  Admission status: inpatient  Time spent in minutes : 45   Fayrene Fearing  Brihana Quickel M.D on 05/11/2017 at 2:06 PM  Between 7am to 7pm - Pager - [phone]   After 7pm go to [email or web address] - password [facility]  [facility] - Office  [phone]

## 2017-05-11 NOTE — ED Provider Notes (Signed)
Emergency Department Provider Note   I have reviewed the triage vital signs and the nursing notes.   HISTORY  Chief Complaint Trauma and Code Stroke   HPI Johnathan Mitchell is a 83 y.o. male with PMH of HTN, CVA, and asthma [place] to the emergency department for evaluation as a code stroke.  40 minutes prior to ED arrival the patient developed acute onset slurred speech and right-sided weakness.  Earlier in the morning the patient had a fall at home.  He typically uses a walker for assistance but was trying to move his bed when he lost his balance and fell.  He has pain in the left upper thigh with swelling.  EMS arrived on scene and initially found him to be hypotensive and LVO positive. He was transported to the ED as a CODE STROKE.   Level 5 caveat: Acute stroke with speech changes.    Past Medical History:  Diagnosis Date  . Asthma   . Dementia   . Hypertension   . Seizures (HCC)   . Stroke [facility])     Patient Active Problem List   Diagnosis Date Noted  . Stroke (HCC) 05/11/2017  . Femur fracture (HCC) 05/11/2017  . Chest pain 02/09/2016  . HTN (hypertension) 02/09/2016  . Dementia with behavioral disturbance 02/09/2016  . Chest pain at rest 02/09/2016  . CVA (cerebral infarction) 02/01/2014  . Benign essential HTN 02/01/2014    Past Surgical History:  Procedure Laterality Date  . BACK SURGERY    . KNEE SURGERY      Current Outpatient Rx  . Order #: [id number] Class: OTC  . Order #: [id number] Class: Historical Med  . Order #: [id number] Class: Historical Med  . Order #: [id number] Class: Historical Med  . Order #: [id number] Class: Historical Med  . Order #: [id number] Class: Historical Med  . Order #: [id number] Class: Print    Allergies Penicillins  Family History  Problem Relation Age of Onset  . Hyperlipidemia Mother   . Hypertension Mother     Social History Social History   Tobacco Use  . Smoking status: Former Games developer  . Smokeless tobacco: Never Used    Substance Use Topics  . Alcohol use: No  . Drug use: No    Review of Systems  Level 5 caveat: Speech changes with CVA ____________________________________________   PHYSICAL EXAM:  VITAL SIGNS: Vitals:   05/11/17 1445 05/11/17 1500  BP: 120/78 (!) 117/91  Pulse: 86 92  Resp: 13 14  Temp:    SpO2: 100% 100%    Constitutional: Alert but with dysarthria. Appears acutely ill.  Eyes: Conjunctivae are normal. PERRL. Head: Atraumatic. Nose: No congestion/rhinnorhea. Mouth/Throat: Mucous membranes are moist.  Neck: No stridor. No cervical spine tenderness to palpation.  Cardiovascular: Normal rate, regular rhythm. Good peripheral circulation. Grossly normal heart sounds.   Respiratory: Normal respiratory effort.  No retractions. Lungs CTAB. Gastrointestinal: Soft and nontender. No distention.  Musculoskeletal: Tense and tender LUE. Palpable pulses in bilateral LEs. No knee/ankles tenderness. Normal ROM of bilateral UEs.  Neurologic: Positive slurred speech. No pronator drift. Normal CN exam 2-12. No weakness/numbness in LEs.  Skin:  Skin is warm, dry and intact. No rash noted.  ____________________________________________   LABS (all labs ordered are listed, but only abnormal results are displayed)  Labs Reviewed  CBC - Abnormal; Notable for the following components:      Result Value   WBC 12.6 (*)    Hemoglobin 11.7 (*)    HCT 34.8 (*)  All other components within normal limits  DIFFERENTIAL - Abnormal; Notable for the following components:   Neutro Abs 10.9 (*)    Lymphs Abs 0.6 (*)    Monocytes Absolute 1.1 (*)    All other components within normal limits  COMPREHENSIVE METABOLIC PANEL - Abnormal; Notable for the following components:   CO2 21 (*)    Glucose, Bld 165 (*)    Creatinine, Ser 1.48 (*)    Albumin 3.4 (*)    AST 45 (*)    GFR calc non Af Amer 43 (*)    GFR calc Af Amer 50 (*)    All other components within normal limits  I-STAT CHEM 8, ED -  Abnormal; Notable for the following components:   Potassium 5.2 (*)    Creatinine, Ser 1.30 (*)    Glucose, Bld 174 (*)    Hemoglobin 12.6 (*)    HCT 37.0 (*)    All other components within normal limits  CBG MONITORING, ED - Abnormal; Notable for the following components:   Glucose-Capillary 165 (*)    All other components within normal limits  ETHANOL  PROTIME-INR  APTT  RAPID URINE DRUG SCREEN, HOSP PERFORMED  URINALYSIS, ROUTINE W REFLEX MICROSCOPIC  HEMOGLOBIN A1C  LIPID PANEL  I-STAT TROPONIN, ED   ____________________________________________  EKG   EKG Interpretation  Date/Time:  Sunday May 11 2017 11:38:22 EDT Ventricular Rate:  94 PR Interval:    QRS Duration: 87 QT Interval:  359 QTC Calculation: 449 R Axis:   71 Text Interpretation:  Sinus rhythm Low voltage, precordial leads Posterior infarct, old Borderline repolarization abnormality No STEMI.  Confirmed by Alona Bene [phone]) on 05/11/2017 5:14:41 PM       ____________________________________________  RADIOLOGY  Ct Chest W Contrast  Result Date: 05/11/2017 CLINICAL DATA:  TIA, hypotension, femur fracture, acute generalized abdominal pain, history hypertension, dementia, asthma, seizures EXAM: CT CHEST, ABDOMEN, AND PELVIS WITH CONTRAST TECHNIQUE: Multidetector CT imaging of the chest, abdomen and pelvis was performed following the standard protocol during bolus administration of intravenous contrast. Sagittal and coronal MPR images reconstructed from axial data set. CONTRAST:  ISOVUE-370 IOPAMIDOL (ISOVUE-370) INJECTION 76% IV. No oral contrast administered. COMPARISON:  None FINDINGS: CT CHEST FINDINGS Cardiovascular: Aneurysmal dilatation of the ascending thoracic aorta 4.1 cm diameter image 35. Additional dilatation of the distal aortic arch 4.1 cm diameter. No evidence of aortic dissection. Pulmonary arteries grossly patent on non targeted exam. No pericardial effusion. Mild dilatation of the RIGHT  ventricle. Atherosclerotic calcifications of the coronary arteries and descending thoracic aorta. Mediastinum/Nodes: Esophagus unremarkable. Base of cervical region normal appearance. No thoracic adenopathy. Lungs/Pleura: Lungs emphysematous with scattered atelectasis in medial RIGHT lower lobe and at both lung bases. No pulmonary infiltrate, pleural effusion or pneumothorax. Musculoskeletal: Osseous demineralization. Advanced degenerative changes of BILATERAL glenohumeral joints. Sclerotic lesion within proximal LEFT humerus, question enchondroma versus bone infarct. CT ABDOMEN PELVIS FINDINGS Hepatobiliary: Gallbladder and liver normal appearance Pancreas: Normal appearance Spleen: Normal appearance Adrenals/Urinary Tract: Adrenal glands, kidneys, ureters, and bladder normal appearance Stomach/Bowel: Normal appendix in RIGHT pelvis. Stomach and bowel loops grossly unremarkable within limitations of patient motion and scattered streak artifacts. Vascular/Lymphatic: Atherosclerotic calcification and tortuosity of abdominal aorta. Aorta normal caliber. Short segment of dissection identified at dilated RIGHT common iliac artery, 19 mm diameter. 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 No adenopathy. Reproductive: Mild prostatic enlargement. Seminal vesicles unremarkable. Other: Small BILATERAL inguinal hernias containing fat.  No free air or free fluid. No definite acute inflammatory process. Musculoskeletal: Diffuse osseous demineralization. Scattered degenerative disc disease changes of the thoracolumbar spine most severe at L5-S1 with mild retrolisthesis. Comminuted subtrochanteric fracture of the proximal LEFT femur extending into the lesser trochanter. Surrounding soft tissue edema/hemorrhage advanced degenerative changes of the RIGHT hip joint. IMPRESSION: No acute intra-abdominal or intrapelvic abnormalities. Displaced comminuted proximal LEFT  femoral fracture extending into lesser trochanter. Degenerative changes of the thoracolumbar spine. Advanced degenerative changes of both shoulders and RIGHT hip joint. COPD changes with scattered subsegmental atelectasis. Scattered atherosclerotic disease including aneurysmal dilatation of ascending thoracic aorta, distal aortic arch, and BILATERAL internal iliac arteries, recommendation below. 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 Recommend semi-annual imaging followup by CTA or MRA and referral to cardiothoracic surgery if not already obtained. This recommendation follows 2010 ACCF/AHA/AATS/ACR/ASA/SCA/SCAI/SIR/STS/SVM Guidelines for the Diagnosis and Management of Patients With Thoracic Aortic Disease. Circulation. 2010; 121: Z610-R60 Electronically Signed   By: Ulyses Southward M.D.   On: 05/11/2017 12:48   Ct Abdomen Pelvis W Contrast  Result Date: 05/11/2017 CLINICAL DATA:  TIA, hypotension, femur fracture, acute generalized abdominal pain, history hypertension, dementia, asthma, seizures EXAM: CT CHEST, ABDOMEN, AND PELVIS WITH CONTRAST TECHNIQUE: Multidetector CT imaging of the chest, abdomen and pelvis was performed following the standard protocol during bolus administration of intravenous contrast. Sagittal and coronal MPR images reconstructed from axial data set. CONTRAST:  ISOVUE-370 IOPAMIDOL (ISOVUE-370) INJECTION 76% IV. No oral contrast administered. COMPARISON:  None FINDINGS: CT CHEST FINDINGS Cardiovascular: Aneurysmal dilatation of the ascending thoracic aorta 4.1 cm diameter image 35. Additional dilatation of the distal aortic arch 4.1 cm diameter. No evidence of aortic dissection. Pulmonary arteries grossly patent on non targeted exam. No pericardial effusion. Mild dilatation of the RIGHT ventricle. Atherosclerotic calcifications of the coronary arteries and descending thoracic aorta. Mediastinum/Nodes: Esophagus unremarkable. Base of cervical region  normal appearance. No thoracic adenopathy. Lungs/Pleura: Lungs emphysematous with scattered atelectasis in medial RIGHT lower lobe and at both lung bases. No pulmonary infiltrate, pleural effusion or pneumothorax. Musculoskeletal: Osseous demineralization. Advanced degenerative changes of BILATERAL glenohumeral joints. Sclerotic lesion within proximal LEFT humerus, question enchondroma versus bone infarct. CT ABDOMEN PELVIS FINDINGS Hepatobiliary: Gallbladder and liver normal appearance Pancreas: Normal appearance Spleen: Normal appearance Adrenals/Urinary Tract: Adrenal glands, kidneys, ureters, and bladder normal appearance Stomach/Bowel: Normal appendix in RIGHT pelvis. Stomach and bowel loops grossly unremarkable within limitations of patient motion and scattered streak artifacts. Vascular/Lymphatic: Atherosclerotic calcification and tortuosity of abdominal aorta. Aorta normal caliber. Short segment of dissection identified at dilated RIGHT common iliac artery, 19 mm diameter. 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 No adenopathy. Reproductive: Mild prostatic enlargement. Seminal vesicles unremarkable. Other: Small BILATERAL inguinal hernias containing fat. No free air or free fluid. No definite acute inflammatory process. Musculoskeletal: Diffuse osseous demineralization. Scattered degenerative disc disease changes of the thoracolumbar spine most severe at L5-S1 with mild retrolisthesis. Comminuted subtrochanteric fracture of the proximal LEFT femur extending into the lesser trochanter. Surrounding soft tissue edema/hemorrhage advanced degenerative changes of the RIGHT hip joint. IMPRESSION: No acute intra-abdominal or intrapelvic abnormalities. Displaced comminuted proximal LEFT femoral fracture extending into lesser trochanter. Degenerative changes of the thoracolumbar spine. Advanced degenerative changes of both shoulders and RIGHT hip  joint. COPD changes with scattered subsegmental atelectasis. Scattered atherosclerotic disease including aneurysmal dilatation of ascending thoracic aorta, distal aortic arch, and BILATERAL internal iliac arteries, recommendation below. Short segment  of dissection within a dilated RIGHT common iliac artery. Recommend semi-annual imaging followup by CTA or MRA and referral to cardiothoracic surgery if not already obtained. This recommendation follows 2010 ACCF/AHA/AATS/ACR/ASA/SCA/SCAI/SIR/STS/SVM Guidelines for the Diagnosis and Management of Patients With Thoracic Aortic Disease. Circulation. 2010; 121: W098-J19 Electronically Signed   By: Ulyses Southward M.D.   On: 05/11/2017 12:48   Dg Femur Port 1v Left  Result Date: 05/11/2017 CLINICAL DATA:  Known fracture, following traction EXAM: LEFT FEMUR PORTABLE 1 VIEW COMPARISON:  Study obtained earlier in the day FINDINGS: Frontal view obtained. Comminuted fracture of the proximal left femoral diaphysis noted without appreciable change. There is avulsion of the lesser trochanter. There is medial displacement of the distal fracture fragment with respect to proximal fragment with 8.7 cm of overriding of fracture fragments. No distal fracture. Advanced arthropathy noted in the left knee joint region. Mild narrowing right hip joint. IMPRESSION: Persistent comminuted fracture proximal femur with a 0.7 cm of overriding of fracture fragments. Avulsion of the lesser trochanter. Alignment similar to pre traction images. Advanced arthropathy in the knee joint. No dislocation evident. Electronically Signed   By: Bretta Bang III M.D.   On: 05/11/2017 15:27   Dg Femur Port Min 2 Views Left  Result Date: 05/11/2017 CLINICAL DATA:  Larey Seat today poor historian, history hypertension, asthma, stroke, dementia, former smoker EXAM: LEFT FEMUR PORTABLE 2 VIEWS COMPARISON:  None FINDINGS: Osseous demineralization. Displaced oblique fracture of the proximal LEFT femoral diaphysis  extending to involve the lesser trochanter. LEFT femoral head, neck and greater trochanter appear intact. LEFT hip joint space preserved without dislocation. Advanced tricompartmental osteoarthritic changes of the LEFT knee with joint space narrowing and spur formation. Calcified debris posteriorly at the LEFT knee joint. Distal LEFT femur intact. IMPRESSION: Displaced oblique fracture of proximal LEFT femoral diaphysis extending into lesser trochanter. Osseous demineralization with advanced degenerative changes of the LEFT knee joint. Electronically Signed   By: Ulyses Southward M.D.   On: 05/11/2017 13:33   Ct Head Code Stroke Wo Contrast  Result Date: 05/11/2017 CLINICAL DATA:  Code stroke. 83 year old male with right side weakness. EXAM: CT HEAD WITHOUT CONTRAST TECHNIQUE: Contiguous axial images were obtained from the base of the skull through the vertex without intravenous contrast. COMPARISON:  Head CTs 08/21/2016 and earlier. FINDINGS: Brain: Chronic left MCA territory encephalomalacia, confluent left hemisphere white matter hypodensity, and ex vacuo enlargement of the left lateral ventricle appears stable since July 2018. There is associated confluent hypodensity in the left cauda thalamic groove and posterior limb left internal capsule with evidence of Wallerian degeneration at the left midbrain and brainstem. Moderate contralateral right hemisphere white matter hypodensity also appears stable. No midline shift, ventriculomegaly, mass effect, evidence of mass lesion, intracranial hemorrhage or evidence of cortically based acute infarction. No other cortical encephalomalacia identified. Vascular: Calcified atherosclerosis at the skull base. Intracranial artery tortuosity. No suspicious intracranial vascular hyperdensity. Skull: No acute osseous abnormality identified. Partially visible advanced upper cervical spine degeneration. Sinuses/Orbits: Visualized paranasal sinuses and mastoids are stable and well  pneumatized. Other: Visualized orbits and scalp soft tissues are within normal limits. ASPECTS [facility] Stroke Program Early CT Score) Total score (0-10 with 10 being normal): 10 (chronic left hemisphere encephalomalacia). IMPRESSION: 1. No acute intracranial abnormality. Pronounced chronic encephalomalacia in the left hemisphere with left deep gray matter and brainstem Wallerian degeneration appears stable since 2018. 2. No acute intracranial hemorrhage. ASPECTS is 10 (chronic left hemisphere encephalomalacia). 3. Study discussed by telephone with Dr. Caryl Pina  on 05/11/2017 at 12:01 . Electronically Signed   By: Odessa FlemingH  Hall M.D.   On: 05/11/2017 12:02    ____________________________________________   PROCEDURES  Procedure(s) performed:   .Critical Care Performed by: Maia PlanLong, Yailine Ballard G, MD Authorized by: Maia PlanLong, Torrie Lafavor G, MD   Critical care provider statement:    Critical care time (minutes):  76   Critical care time was exclusive of:  Separately billable procedures and treating other patients and teaching time   Critical care was necessary to treat or prevent imminent or life-threatening deterioration of the following conditions:  Shock, trauma and CNS failure or compromise   Critical care was time spent personally by me on the following activities:  Blood draw for specimens, development of treatment plan with patient or surrogate, discussions with consultants, evaluation of patient's response to treatment, examination of patient, obtaining history from patient or surrogate, ordering and performing treatments and interventions, ordering and review of laboratory studies, ordering and review of radiographic studies, pulse oximetry, re-evaluation of patient's condition and review of old charts   I assumed direction of critical care for this patient from another provider in my specialty: no       ____________________________________________   INITIAL IMPRESSION / ASSESSMENT AND PLAN / ED  COURSE  Pertinent labs & imaging results that were available during my care of the patient were reviewed by me and considered in my medical decision making (see chart for details).  Patient arrives to the emergency department as a code stroke.  His history is complicated by a mechanical fall earlier in the morning.  He has a swollen, tender left thigh and I have some concern for possible femur fracture.  No abdominal tenderness or chest tenderness.  Patient was initially stopped in the trauma resuscitation bay to confirm blood pressure which is now hypertensive.  IV access was established and the patient is now clear for CT head.  Spoke with neurology at bedside who the patient would not be a TPA candidate with concern for traumatic bleeding or injury.  Patient will go for CT of the head and likely CT Angie of the head and neck.  While in CT the patient once again became hypotensive.  An additional IV line was established and CT and he was given a 1 L bolus on pressure bag.  Pressure improved and we were able to transition from stroke imaging to CT of the chest, abdomen, pelvis.  Scout film a displaced proximal femur fracture on the left.  The patient's status was upgraded to a level 2 trauma.  Spoke with neurology who recommends vascular imaging of the head and neck once stabilized from a trauma perspective.  The working diagnosis for stroke symptoms as a watershed TIA likely from his prior stroke and hypotension related to his traumatic injury.   Spoke with ortho who will consult. Recommends traction splint and medicine admit. Also spoke with Vascular Surgery regarding the right iliac dissection but contralateral to injury and not considered acute.   Discussed patient's case with Hospitalist, Dr. Selena BattenKim to request admission. Patient and family (if present) updated with plan. Care transferred to Hospitalist service.  I reviewed all nursing notes, vitals, pertinent old records, EKGs, labs, imaging (as  available).  ____________________________________________  FINAL CLINICAL IMPRESSION(S) / ED DIAGNOSES  Final diagnoses:  Closed fracture of left femur, unspecified fracture morphology, unspecified portion of femur, initial encounter (HCC)  Hypotension, unspecified hypotension type  TIA (transient ischemic attack)     MEDICATIONS GIVEN DURING THIS VISIT:  Medications  iopamidol (ISOVUE-370) 76 % injection (has no administration in time range)  iopamidol (ISOVUE-300) 61 % injection (has no administration in time range)   stroke: mapping our early stages of recovery book (has no administration in time range)  fentaNYL (SUBLIMAZE) injection 50 mcg (50 mcg Intravenous Given 05/11/17 1228)  iopamidol (ISOVUE-370) 76 % injection 100 mL (100 mLs Intravenous Contrast Given 05/11/17 1224)     Note:  This document was prepared using Dragon voice recognition software and may include unintentional dictation errors.  Alona Bene, MD Emergency Medicine    Sheba Whaling, Arlyss Repress, MD 05/11/17 [phone]

## 2017-05-11 NOTE — Progress Notes (Signed)
Orthopedic Tech Progress Note Patient Details:  Threasa BeardsJames C Lumbert 06/23/1934 [id number]  Musculoskeletal Traction Type of Traction: Bucks Skin Traction Traction Location: lle Traction Weight: 10 lbs   Post Interventions Patient Tolerated: Well Instructions Provided: Care of device   Nikki DomCrawford, Adonys Wildes 05/11/2017, 1:54 PM As ordered by Dr, Jacqulyn BathLong

## 2017-05-11 NOTE — ED Notes (Signed)
Upon arrival to floor pt reports he is missing his glasses, room in ED was checked and no glasses were found.

## 2017-05-11 NOTE — Progress Notes (Signed)
Orthopedic Tech Progress Note Patient Details:  Johnathan Mitchell 06/23/1934 [id number]  [name], male   DOB: 06/23/1934, 83 y.o.   MRN: [id number]   Nikki DomCrawford, Dorian Renfro 05/11/2017, 12:22 PM Made level 2 trauma visit

## 2017-05-11 NOTE — Progress Notes (Signed)
4/14 @ 1427, attempted to bring pt to MRI for exam. Per RN in charge of pt, there is too much going on. Unable to do exam at this time.

## 2017-05-11 NOTE — Consult Note (Signed)
Hospital Consult    Reason for Consult: right common iliac dissection Referring Physician: Dr. Jacqulyn Bath MRN #:  [id number]  History of Present Illness  83 y.o. male here with left-sided femur fracture.  He had a CT scan which demonstrated right common iliac artery dissection.  He does not have a right lower extremity symptoms.  He has aneurysms but was unaware that they were there.  He has a previous history of dementia as well as hypertension and previous stroke.  He has never seen vascular surgery before.  He does not take blood thinners.    Past Medical History:  Diagnosis Date  . Asthma   . Dementia   . Hypertension   . Seizures (HCC)   . Stroke [facility])     Past Surgical History:  Procedure Laterality Date  . BACK SURGERY    . KNEE SURGERY      Allergies  Allergen Reactions  . Penicillins Hives and Swelling    Patient couldn't answer any questions    Prior to Admission medications   Medication Sig Start Date End Date Taking? Authorizing Provider  aspirin 81 MG tablet Take 1 tablet (81 mg total) by mouth daily. 02/02/14   Elgergawy, Leana Roe, MD  atorvastatin (LIPITOR) 20 MG tablet Take 10 mg by mouth at bedtime.    [provider]  donepezil (ARICEPT) 10 MG tablet Take 5 mg by mouth every morning.    [provider]  etodolac (LODINE) 200 MG capsule Take 200 mg by mouth daily as needed for moderate pain.     [provider]  lisinopril (PRINIVIL,ZESTRIL) 5 MG tablet Take 2.5 mg by mouth daily.    [provider]  traMADol (ULTRAM) 50 MG tablet Take 1 tablet (50 mg total) by mouth every 6 (six) hours as needed. Patient not taking: Reported on 08/21/2016 06/25/13   Elwin Mocha, MD  Vitamin D, Ergocalciferol, (DRISDOL) 50000 units CAPS capsule Take 50,000 Units by mouth every 7 (seven) days.    [provider]    Social History   Socioeconomic History  . Marital status: Single    Spouse name: Not on file  . Number of children: Not  on file  . Years of education: Not on file  . Highest education level: Not on file  Occupational History  . Not on file  Social Needs  . Financial resource strain: Not on file  . Food insecurity:    Worry: Not on file    Inability: Not on file  . Transportation needs:    Medical: Not on file    Non-medical: Not on file  Tobacco Use  . Smoking status: Former Games developer  . Smokeless tobacco: Never Used  Substance and Sexual Activity  . Alcohol use: No  . Drug use: No  . Sexual activity: Not on file  Lifestyle  . Physical activity:    Days per week: Not on file    Minutes per session: Not on file  . Stress: Not on file  Relationships  . Social connections:    Talks on phone: Not on file    Gets together: Not on file    Attends religious service: Not on file    Active member of club or organization: Not on file    Attends meetings of clubs or organizations: Not on file    Relationship status: Not on file  . Intimate partner violence:    Fear of current or ex partner: Not on file  Emotionally abused: Not on file    Physically abused: Not on file    Forced sexual activity: Not on file  Other Topics Concern  . Not on file  Social History Narrative  . Not on file     Family History  Problem Relation Age of Onset  . Hyperlipidemia Mother   . Hypertension Mother     ROS: [x]  Positive   [ ]  Negative   [ ]  All sytems reviewed and are negative  Cardiovascular: []  chest pain/pressure []  palpitations []  SOB lying flat []  DOE []  pain in legs while walking [x]  pain in legs at rest []  pain in legs at night []  non-healing ulcers []  hx of DVT []  swelling in legs  Pulmonary: []  productive cough []  asthma/wheezing []  home O2  Neurologic: []  weakness in []  arms []  legs []  numbness in []  arms []  legs []  hx of CVA []  mini stroke [] difficulty speaking or slurred speech []  temporary loss of vision in one eye []  dizziness  Hematologic: []  hx of cancer []  bleeding  problems []  problems with blood clotting easily  Endocrine:   []  diabetes []  thyroid disease  GI []  vomiting blood []  blood in stool  GU: []  CKD/renal failure []  HD--[]  M/W/F or []  T/T/S []  burning with urination []  blood in urine  Psychiatric: []  anxiety []  depression  Musculoskeletal: []  arthritis []  joint pain  Integumentary: []  rashes []  ulcers  Constitutional: []  fever []  chills   Physical Examination  Vitals:   05/11/17 1231 05/11/17 1245  BP: 112/80   Pulse: 82 77  Resp: 19 10  Temp:    SpO2:  100%   Body mass index is 24.21 kg/m.  General:  NAD HENT: WNL, normocephalic Pulmonary: normal non-labored breathing, without Rales, rhonchi,  wheezing Cardiac: Palpable bilateral femoral pulses Palpable dorsalis pedis is bilaterally Abdomen: soft, NT/ND, no masses Extremities: Deformity left thigh Musculoskeletal: no muscle wasting or atrophy  Neurologic: He is awake and alert and answering questions appropriately  CBC    Component Value Date/Time   WBC 12.6 (H) 05/11/2017 1135   RBC 4.32 05/11/2017 1135   HGB 12.6 (L) 05/11/2017 1146   HCT 37.0 (L) 05/11/2017 1146   PLT 203 05/11/2017 1135   MCV 80.6 05/11/2017 1135   MCH 27.1 05/11/2017 1135   MCHC 33.6 05/11/2017 1135   RDW 15.3 05/11/2017 1135   LYMPHSABS 0.6 (L) 05/11/2017 1135   MONOABS 1.1 (H) 05/11/2017 1135   EOSABS 0.0 05/11/2017 1135   BASOSABS 0.0 05/11/2017 1135    BMET    Component Value Date/Time   NA 137 05/11/2017 1146   K 5.2 (H) 05/11/2017 1146   CL 104 05/11/2017 1146   CO2 21 (L) 05/11/2017 1135   GLUCOSE 174 (H) 05/11/2017 1146   BUN 20 05/11/2017 1146   CREATININE 1.30 (H) 05/11/2017 1146   CALCIUM 9.4 05/11/2017 1135   GFRNONAA 43 (L) 05/11/2017 1135   GFRAA 50 (L) 05/11/2017 1135    COAGS: Lab Results  Component Value Date   INR 1.06 05/11/2017   INR 0.97 02/01/2014     CTA IMPRESSION: No acute intra-abdominal or intrapelvic  abnormalities.  Displaced comminuted proximal LEFT femoral fracture extending into lesser trochanter.  Degenerative changes of the thoracolumbar spine.  Advanced degenerative changes of both shoulders and RIGHT hip joint.  COPD changes with scattered subsegmental atelectasis.  Scattered atherosclerotic disease including aneurysmal dilatation of ascending thoracic aorta, distal aortic arch, and BILATERAL internal iliac arteries, recommendation below.  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    ASSESSMENT/PLAN: This is a 83 y.o. 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  Okay to proceed with orthopedic surgery as necessary.  Recommend aspirin when okay from orthopedic standpoint.  Will need repeat imaging in the office in the future and will arrange prior to discharge.  Jaleen Grupp C. Randie Heinzain, MD [facility] [place] Office: [phone] Pager: [phone]

## 2017-05-11 NOTE — Consult Note (Signed)
Referring Physician: Maia Plan, MD    Chief Complaint: No chief complaint on file.   HPI: Johnathan Mitchell is an 83 y.o. male with prior CVA with right-sided residual weakness is wheelchair-bound, dementia, hypertension, seizure who presents to the ED with acute transient aphasia, flaccidity on the right side and came to the ER as a code stroke. Per family 7 am this morning, patient was suspected and had a witnessed fall.  Family was able to get him up and put him in his wheelchair without incident.  Per family patient was fine and conversing with them normally.  Around 10:50 AM, while watching TV with his family they noticed he suddenly became aphasic and flaccid on his right upper and lower extremities.  They called immediately and upon arrival patient's SBP was in the 70s and with a bit of movement it was 90/60.  While in the ambulance en route to the hospital the patient complained of severe left leg pain and then it was noticed that he had very large hematoma in the left hip area, wincing in pain when left leg manipulated.  On arrival to the hospital patient's blood pressure was elevated at 183/169, he was more verbal without evidence of dysarthria. He continued to have right-sided weakness but no drift noted on bilateral lower extremities; equal strength with bilateral great toe raise.  STAT CT of the head revealed a chronic old left stroke and no acute changes.  He denies recent fevers, chills, nausea or vomiting; no headache or dizziness, patient has baseline dementia which may decrease the accuracy of this review of systems.  LSN:05/11/17 - 1050am tPA Given: No - Improving neurological deficits and high risk of severe hemorrhagic complications given probable left hip hematoma in the setting of labile BP with intermittent hypotension Premorbid modified Rankin scale (mRS): 4   Past Medical History:  Diagnosis Date  . Asthma   . Dementia   . Hypertension   . Seizures (HCC)   . Stroke  [facility])     Past Surgical History:  Procedure Laterality Date  . BACK SURGERY    . KNEE SURGERY      Family History  Problem Relation Age of Onset  . Hyperlipidemia Mother   . Hypertension Mother    Social History:  reports that he has quit smoking. He has never used smokeless tobacco. He reports that he does not drink alcohol or use drugs.  Allergies:  Allergies  Allergen Reactions  . Penicillins Hives and Swelling    Patient couldn't answer any questions    Medications:  . iopamidol        ROS: 13 point systems reviewed with patient and are negative except as per history of present illness, patient has baseline dementia and doubt this is accurate review of systems.  Awaiting more information from family   Physical Examination: There were no vitals taken for this visit. HEENT-  Normocephalic  Lungs- Respirations unlabored.  Saturations within normal limits Ext: Left thigh swelling and pain  Neurological Examination Mental Status: Awake and alert. Able to answer simple questions with short sentences; no errors of grammar or syntax. Able to follow simple commands. Oriented to self and city, not his age, the day, month or year.  Cranial Nerves: II: Visual fields intact to confrontation bilaterally. PERRL.  III,IV, VI: Ptosis not present, horizontal EOM without nystagmus VII: Smile symmetric VIII: Hearing intact to voice IX,X: No hypophonia XI: Head at midline XII: midline tongue extension Motor: RUE: Mild drift.  Deltoid 4/5 in the setting of shoulder pain. Biceps and triceps 4+/5, grip 4/5 LUE: No drift. Deltoid 4/5 in the setting of shoulder pain. Biceps and triceps 4+/5, grip 4/5 RLE: Elevates antigravity and withdraws briskly to noxious. Knee extension 4+/5.  UJW:JXBJYNWLLE:Wiggles toes. Not further tested due to left hip swelling and pain suggestive of hip fx Sensory: Reacts to tactile and noxious stimulation all 4 ext without definite asymmetry Deep Tendon Reflexes: 2+  biceps and brachioradialis bilaterally. 2+ right patella. Unable to test left patella due to pain Plantars: Downgoing bilaterally Cerebellar: No upper extremity ataxia noted.  Gait: Unable to assess  Results for orders placed or performed during the hospital encounter of 05/11/17 (from the past 48 hour(s))  CBG monitoring, ED     Status: Abnormal   Collection Time: 05/11/17 11:36 AM  Result Value Ref Range   Glucose-Capillary 165 (H) 65 - 99 mg/dL   No results found.  Assessment: 83 y.o. male with prior CVA with right-sided residual weakness who is wheelchair-bound, with dementia, hypertension, and seizure history who presents to the ED with acute transient aphasia and flaccidity on the right side. Neurological deficits rapidly improving.    1. Overall clinical impression: [place] TIA secondary to hypotension status post fall. Low BP may be from intravascular volume loss secondary to possible left thigh hematoma. May have been a new  likely led to recrudescence of old stroke.  Recommend maintaining hemodynamic stability with increasing blood pressure to an acceptable level, IVF hydration. Pursue full stroke/TIA workup when patient more stable 2. Left femur fracture post fall  3. Stroke Risk Factors - hypertension and previous stroke hx   Plan: - HgbA1c, fasting lipid panel - MRI and MRA of the brain without contrast when stable. If he acutely worsens call Neurology and obtain CTA head/neck instead - Ortho eval - PT consult, OT consult, Speech consult - Echocardiogram - Carotid dopplers - Prophylactic therapy-None at this time secondary to large left hip hematoma - Lipitor 40mg  - Risk factor modification - Telemetry monitoring -  Frequent neuro checks   Johnathan Ewingearl Amank[place]ah DNP Neuro-hospitalist Team [phone] 05/11/2017, 11:51 AM  50 minutes spent in the emergent Neurological evaluation and management of this critically ill acute stroke/TIA patient Electronically signed: Dr. Caryl PinaEric  Elliot Simoneaux

## 2017-05-11 NOTE — ED Triage Notes (Signed)
Patient came to ED initially as a Code Stroke for reports of expressive aphasia and R sided weakness, LSN 1040. Patient was found by his daughter at 9am on the floor behind the door - pt is wheelchair bound and had fallen out of chair. Patient told his daughter he had been on the floor for 2 hours. Obvious deformity noted to L upper thigh with swelling, firm to touch. Patient was also hypotensive PTA - 70 palpated with EMS - upon arrival to ED, patient appeared confused, but followed commands and answered questions appropriately. Pt did have drift in the R leg upon initial assessment. [facility] and while in [facility], reported the pain was only in his L leg. Patient able to move all extremities well except for L leg - can move toes, sensation and pulses equal and intact distal to injury. Patient A&O x 4. No other injuries noted.

## 2017-05-11 NOTE — ED Notes (Signed)
Family at bedside. 

## 2017-05-11 NOTE — Progress Notes (Signed)
ORTHOPAEDIC CONSULTATION  REQUESTING PHYSICIAN: Johnathan Gravel, MD  Chief Complaint: L hip pain  HPI: Johnathan Mitchell is a 83 y.o. male with  CVA and fall who is minimally ambulatory who has a history of previous CVA presents with subtroch L femur fracture.  History primarily gained from Daughter Johnathan Mitchell.    Patient had mechanical fall, spoke with Dr. Maudie Mercury and being worked up for new CVA.  Neuro pending.  Orthopedics called for management of femur fracture  Past Medical History:  Diagnosis Date  . Asthma   . Dementia   . Hypertension   . Seizures ([place])   . Stroke [facility]    Past Surgical History:  Procedure Laterality Date  . BACK SURGERY    . KNEE SURGERY     Social History   Socioeconomic History  . Marital status: Single    Spouse name: Not on file  . Number of children: Not on file  . Years of education: Not on file  . Highest education level: Not on file  Occupational History  . Not on file  Social Needs  . Financial resource strain: Not on file  . Food insecurity:    Worry: Not on file    Inability: Not on file  . Transportation needs:    Medical: Not on file    Non-medical: Not on file  Tobacco Use  . Smoking status: Former Research scientist (life sciences)  . Smokeless tobacco: Never Used  Substance and Sexual Activity  . Alcohol use: No  . Drug use: No  . Sexual activity: Not on file  Lifestyle  . Physical activity:    Days per week: Not on file    Minutes per session: Not on file  . Stress: Not on file  Relationships  . Social connections:    Talks on phone: Not on file    Gets together: Not on file    Attends religious service: Not on file    Active member of club or organization: Not on file    Attends meetings of clubs or organizations: Not on file    Relationship status: Not on file  Other Topics Concern  . Not on file  Social History Narrative  . Not on file   Family History  Problem Relation Age of Onset  . Hyperlipidemia Mother   . Hypertension Mother     Allergies  Allergen Reactions  . Penicillins Hives and Swelling    Patient couldn't answer any questions   Prior to Admission medications   Medication Sig Start Date End Date Taking? Authorizing Provider  aspirin 81 MG tablet Take 1 tablet (81 mg total) by mouth daily. 02/02/14  Yes Elgergawy, Silver Huguenin, MD  atorvastatin (LIPITOR) 20 MG tablet Take 10 mg by mouth at bedtime.   Yes [provider]  Cholecalciferol (VITAMIN D) 2000 units CAPS Take 2,000 Units by mouth daily.   Yes [provider]  divalproex (DEPAKOTE ER) 500 MG 24 hr tablet Take 500 mg by mouth at bedtime.   Yes [provider]  escitalopram (LEXAPRO) 10 MG tablet Take 10 mg by mouth daily.   Yes [provider]  lisinopril (PRINIVIL,ZESTRIL) 5 MG tablet Take 2.5 mg by mouth daily.   Yes [provider]  traMADol (ULTRAM) 50 MG tablet Take 1 tablet (50 mg total) by mouth every 6 (six) hours as needed. Patient not taking: Reported on 08/21/2016 06/25/13   Johnathan Bucy, MD   Ct Chest W Contrast  Result  Date: 05/11/2017 CLINICAL DATA:  TIA, hypotension, femur fracture, acute generalized abdominal pain, history hypertension, dementia, asthma, seizures EXAM: CT CHEST, ABDOMEN, AND PELVIS WITH CONTRAST TECHNIQUE: Multidetector CT imaging of the chest, abdomen and pelvis was performed following the standard protocol during bolus administration of intravenous contrast. Sagittal and coronal MPR images reconstructed from axial data set. CONTRAST:  169m ISOVUE-370 IOPAMIDOL (ISOVUE-370) INJECTION 76% IV. No oral contrast administered. COMPARISON:  None FINDINGS: CT CHEST FINDINGS Cardiovascular: Aneurysmal dilatation of the ascending thoracic aorta 4.1 cm diameter image 35. Additional dilatation of the distal aortic arch 4.1 cm diameter. No evidence of aortic dissection. Pulmonary arteries grossly patent on non targeted exam. No pericardial effusion. Mild dilatation of the RIGHT ventricle.  Atherosclerotic calcifications of the coronary arteries and descending thoracic aorta. Mediastinum/Nodes: Esophagus unremarkable. Base of cervical region normal appearance. No thoracic adenopathy. Lungs/Pleura: Lungs emphysematous with scattered atelectasis in medial RIGHT lower lobe and at both lung bases. No pulmonary infiltrate, pleural effusion or pneumothorax. Musculoskeletal: Osseous demineralization. Advanced degenerative changes of BILATERAL glenohumeral joints. Sclerotic lesion within proximal LEFT humerus, question enchondroma versus bone infarct. CT ABDOMEN PELVIS FINDINGS Hepatobiliary: Gallbladder and liver normal appearance Pancreas: Normal appearance Spleen: Normal appearance Adrenals/Urinary Tract: Adrenal glands, kidneys, ureters, and bladder normal appearance Stomach/Bowel: Normal appendix in RIGHT pelvis. Stomach and bowel loops grossly unremarkable within limitations of patient motion and scattered streak artifacts. Vascular/Lymphatic: Atherosclerotic calcification and tortuosity of abdominal aorta. Aorta normal caliber. Short segment of dissection identified at dilated RIGHT common iliac artery, 19 mm diameter. 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 No adenopathy. Reproductive: Mild prostatic enlargement. Seminal vesicles unremarkable. Other: Small BILATERAL inguinal hernias containing fat. No free air or free fluid. No definite acute inflammatory process. Musculoskeletal: Diffuse osseous demineralization. Scattered degenerative disc disease changes of the thoracolumbar spine most severe at L5-S1 with mild retrolisthesis. Comminuted subtrochanteric fracture of the proximal LEFT femur extending into the lesser trochanter. Surrounding soft tissue edema/hemorrhage advanced degenerative changes of the RIGHT hip joint. IMPRESSION: No acute intra-abdominal or intrapelvic abnormalities. Displaced comminuted proximal LEFT femoral  fracture extending into lesser trochanter. Degenerative changes of the thoracolumbar spine. Advanced degenerative changes of both shoulders and RIGHT hip joint. COPD changes with scattered subsegmental atelectasis. Scattered atherosclerotic disease including aneurysmal dilatation of ascending thoracic aorta, distal aortic arch, and BILATERAL internal iliac arteries, recommendation below. 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 Recommend semi-annual imaging followup by CTA or MRA and referral to cardiothoracic surgery if not already obtained. This recommendation follows 2010 ACCF/AHA/AATS/ACR/ASA/SCA/SCAI/SIR/STS/SVM Guidelines for the Diagnosis and Management of Patients With Thoracic Aortic Disease. Circulation. 2010; 121:: [id number] Signed   By: MLavonia DanaM.D.   On: 05/11/2017 12:48   Ct Abdomen Pelvis W Contrast  Result Date: 05/11/2017 CLINICAL DATA:  TIA, hypotension, femur fracture, acute generalized abdominal pain, history hypertension, dementia, asthma, seizures EXAM: CT CHEST, ABDOMEN, AND PELVIS WITH CONTRAST TECHNIQUE: Multidetector CT imaging of the chest, abdomen and pelvis was performed following the standard protocol during bolus administration of intravenous contrast. Sagittal and coronal MPR images reconstructed from axial data set. CONTRAST:  1014mISOVUE-370 IOPAMIDOL (ISOVUE-370) INJECTION 76% IV. No oral contrast administered. COMPARISON:  None FINDINGS: CT CHEST FINDINGS Cardiovascular: Aneurysmal dilatation of the ascending thoracic aorta 4.1 cm diameter image 35. Additional dilatation of the distal aortic arch 4.1 cm diameter. No evidence of aortic dissection. Pulmonary arteries grossly patent on non targeted exam. No pericardial effusion. Mild  dilatation of the RIGHT ventricle. Atherosclerotic calcifications of the coronary arteries and descending thoracic aorta. Mediastinum/Nodes: Esophagus unremarkable. Base of cervical region normal  appearance. No thoracic adenopathy. Lungs/Pleura: Lungs emphysematous with scattered atelectasis in medial RIGHT lower lobe and at both lung bases. No pulmonary infiltrate, pleural effusion or pneumothorax. Musculoskeletal: Osseous demineralization. Advanced degenerative changes of BILATERAL glenohumeral joints. Sclerotic lesion within proximal LEFT humerus, question enchondroma versus bone infarct. CT ABDOMEN PELVIS FINDINGS Hepatobiliary: Gallbladder and liver normal appearance Pancreas: Normal appearance Spleen: Normal appearance Adrenals/Urinary Tract: Adrenal glands, kidneys, ureters, and bladder normal appearance Stomach/Bowel: Normal appendix in RIGHT pelvis. Stomach and bowel loops grossly unremarkable within limitations of patient motion and scattered streak artifacts. Vascular/Lymphatic: Atherosclerotic calcification and tortuosity of abdominal aorta. Aorta normal caliber. Short segment of dissection identified at dilated RIGHT common iliac artery, 19 mm diameter. 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 No adenopathy. Reproductive: Mild prostatic enlargement. Seminal vesicles unremarkable. Other: Small BILATERAL inguinal hernias containing fat. No free air or free fluid. No definite acute inflammatory process. Musculoskeletal: Diffuse osseous demineralization. Scattered degenerative disc disease changes of the thoracolumbar spine most severe at L5-S1 with mild retrolisthesis. Comminuted subtrochanteric fracture of the proximal LEFT femur extending into the lesser trochanter. Surrounding soft tissue edema/hemorrhage advanced degenerative changes of the RIGHT hip joint. IMPRESSION: No acute intra-abdominal or intrapelvic abnormalities. Displaced comminuted proximal LEFT femoral fracture extending into lesser trochanter. Degenerative changes of the thoracolumbar spine. Advanced degenerative changes of both shoulders and RIGHT hip joint. COPD  changes with scattered subsegmental atelectasis. Scattered atherosclerotic disease including aneurysmal dilatation of ascending thoracic aorta, distal aortic arch, and BILATERAL internal iliac arteries, recommendation below. 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 Recommend semi-annual imaging followup by CTA or MRA and referral to cardiothoracic surgery if not already obtained. This recommendation follows 2010 ACCF/AHA/AATS/ACR/ASA/SCA/SCAI/SIR/STS/SVM Guidelines for the Diagnosis and Management of Patients With Thoracic Aortic Disease. Circulation. 2010; 121: [id number] Electronically Signed   By: Lavonia Dana M.D.   On: 05/11/2017 12:48   Dg Femur Port Min 2 Views Left  Result Date: 05/11/2017 CLINICAL DATA:  [place] today poor historian, history hypertension, asthma, stroke, dementia, former smoker EXAM: LEFT FEMUR PORTABLE 2 VIEWS COMPARISON:  None FINDINGS: Osseous demineralization. Displaced oblique fracture of the proximal LEFT femoral diaphysis extending to involve the lesser trochanter. LEFT femoral head, neck and greater trochanter appear intact. LEFT hip joint space preserved without dislocation. Advanced tricompartmental osteoarthritic changes of the LEFT knee with joint space narrowing and spur formation. Calcified debris posteriorly at the LEFT knee joint. Distal LEFT femur intact. IMPRESSION: Displaced oblique fracture of proximal LEFT femoral diaphysis extending into lesser trochanter. Osseous demineralization with advanced degenerative changes of the LEFT knee joint. Electronically Signed   By: Lavonia Dana M.D.   On: 05/11/2017 13:33   Ct Head Code Stroke Wo Contrast  Result Date: 05/11/2017 CLINICAL DATA:  Code stroke. 83 year old male with right side weakness. EXAM: CT HEAD WITHOUT CONTRAST TECHNIQUE: Contiguous axial images were obtained from the base of the skull through the vertex without intravenous contrast. COMPARISON:  Head CTs 08/21/2016 and earlier.  FINDINGS: Brain: Chronic left MCA territory encephalomalacia, confluent left hemisphere white matter hypodensity, and ex vacuo enlargement of the left lateral ventricle appears stable since July 2018. There is associated confluent hypodensity in the left cauda thalamic groove and posterior limb left internal capsule with evidence of Wallerian degeneration at the left midbrain and brainstem. Moderate  contralateral right hemisphere white matter hypodensity also appears stable. No midline shift, ventriculomegaly, mass effect, evidence of mass lesion, intracranial hemorrhage or evidence of cortically based acute infarction. No other cortical encephalomalacia identified. Vascular: Calcified atherosclerosis at the skull base. Intracranial artery tortuosity. No suspicious intracranial vascular hyperdensity. Skull: No acute osseous abnormality identified. Partially visible advanced upper cervical spine degeneration. Sinuses/Orbits: Visualized paranasal sinuses and mastoids are stable and well pneumatized. Other: Visualized orbits and scalp soft tissues are within normal limits. ASPECTS [facility] Stroke Program Early CT Score) Total score (0-10 with 10 being normal): 10 (chronic left hemisphere encephalomalacia). IMPRESSION: 1. No acute intracranial abnormality. Pronounced chronic encephalomalacia in the left hemisphere with left deep gray matter and brainstem Wallerian degeneration appears stable since 2018. 2. No acute intracranial hemorrhage. ASPECTS is 10 (chronic left hemisphere encephalomalacia). 3. Study discussed by telephone with Dr. Kerney Elbe on 05/11/2017 at 12:01 . Electronically Signed   By: Genevie Ann M.D.   On: 05/11/2017 12:02   Family History Reviewed and non-contributory, no pertinent history of problems with bleeding or anesthesia      Review of Systems 14 system ROS conducted and negative except for that noted in HPI   OBJECTIVE  Vitals: Patient Vitals for the past 8 hrs:  BP Temp Pulse Resp  SpO2 Height Weight  05/11/17 1415 (!) 115/94 - 85 14 100 % - -  05/11/17 1400 112/80 - 87 15 100 % - -  05/11/17 1345 119/86 - 94 17 - - -  05/11/17 1330 112/87 - 88 17 100 % - -  05/11/17 1300 (!) 112/92 - 76 13 100 % - -  05/11/17 1245 - - 77 10 100 % - -  05/11/17 1244 - - - - - 5' 6"  (1.676 m) 150 lb (68 kg)  05/11/17 1231 112/80 - 82 19 - - -  05/11/17 1228 (!) 146/99 - 86 17 98 % - -  05/11/17 1224 130/70 - 85 (!) 22 - - -  05/11/17 1218 (!) 150/94 - - 18 99 % - -  05/11/17 1215 - - 85 (!) 21 97 % - -  05/11/17 1205 131/76 - - (!) 21 - - -  05/11/17 1201 120/79 - - (!) 28 - - -  05/11/17 1145 (!) 89/65 (!) 96.9 F (36.1 C) 73 17 93 % - -   General: Alert, no acute distress Cardiovascular: No pedal edema Respiratory: No cyanosis, no use of accessory musculature GI: No organomegaly, abdomen is soft and non-tender Skin: No lesions in the area of chief complaint other than those listed below in MSK exam.  Neurologic: Sensation intact distally save for the below mentioned MSK exam Psychiatric: Patient is competent for consent with normal mood and affect Lymphatic: No axillary or cervical lymphadenopathy Extremities  LLE: Shortened and externally rotated.  ROM deferred. + GS/TA/EHL. Sensation intact in DP/SP/S/S/P distributions. 2+ DP pulse with warm and well perfused digits. Compartments soft and compressible, with no pain on passive stretch.   Rest of the extremities are without swelling, deformity, or effusion. Skin intact.  Nontender to palpation, with full and painless ROM throughout. Neurovascularly intact in all distributions. Compartments soft and compressible.  Bucks 10 lbs in place  Test Results Imaging XR demonstrates subtroch type L femur fracture with shortening.    Labs cbc Recent Labs    05/11/17 1135 05/11/17 1146  WBC 12.6*  --   HGB 11.7* 12.6*  HCT 34.8* 37.0*  PLT 203  --  Labs inflam No results for input(s): CRP in the last 72 hours.  Invalid  input(s): ESR  Labs coag Recent Labs    05/11/17 1135  INR 1.06    Recent Labs    05/11/17 1135 05/11/17 1146  NA 136 137  K 5.1 5.2*  CL 103 104  CO2 21*  --   GLUCOSE 165* 174*  BUN 17 20  CREATININE 1.48* 1.30*  CALCIUM 9.4  --      ASSESSMENT AND PLAN: 83 y.o. male with the following:   L proximal femoral shaft fracture  Patient's stroke precludes early surgical intervention.  Await final medical/neuro recs for clearance.  Bucks traction until fixation able to be performed.    Discussed options and non-operative versus operative measures.  Non-operative measures have a predictably poor set of outcomes in an ambulatory patient including bedsores and other medical complications.  Understanding this the patient/family elected to proceed with operative measures.  The risks and benefits of  surgical intervention including infection, bleeding, nerve injury, periprosthetic fracture, the need for revision surgery, leg length discrepancy, gait change, blood clots, cardiopulmonary complications, morbidity, mortality, among others, and they were willing to proceed.    Awaiting clearance.  Anticoagulation per medical team prior to surgery.

## 2017-05-11 NOTE — ED Notes (Signed)
Patient taken to trauma A from CT.

## 2017-05-12 ENCOUNTER — Inpatient Hospital Stay (HOSPITAL_COMMUNITY): Payer: Medicare Other

## 2017-05-12 ENCOUNTER — Other Ambulatory Visit: Payer: Self-pay

## 2017-05-12 ENCOUNTER — Encounter (HOSPITAL_COMMUNITY): Payer: Self-pay | Admitting: *Deleted

## 2017-05-12 DIAGNOSIS — I6300 Cerebral infarction due to thrombosis of unspecified precerebral artery: Secondary | ICD-10-CM

## 2017-05-12 DIAGNOSIS — I6789 Other cerebrovascular disease: Secondary | ICD-10-CM

## 2017-05-12 DIAGNOSIS — N179 Acute kidney failure, unspecified: Secondary | ICD-10-CM

## 2017-05-12 LAB — TROPONIN I: Troponin I: <0.03 ng/mL (ref <0.03)

## 2017-05-12 LAB — LIPID PANEL
Cholesterol: 177 mg/dL (ref 0–200)
HDL: 72 mg/dL (ref >40)
LDL Cholesterol: 91 mg/dL (ref 0–99)
Total CHOL/HDL Ratio: 2.5 RATIO
Triglycerides: 69 mg/dL (ref <150)
VLDL: 14 mg/dL (ref 0–40)

## 2017-05-12 LAB — CBC
HCT: 31.6 % — ABNORMAL LOW (ref 39.0–52.0)
HEMOGLOBIN: 10.3 g/dL — AB (ref 13.0–17.0)
MCH: 26.9 pg (ref 26.0–34.0)
MCHC: 32.6 g/dL (ref 30.0–36.0)
MCV: 82.5 fL (ref 78.0–100.0)
Platelets: 170 10*3/uL (ref 150–400)
RBC: 3.83 MIL/uL — ABNORMAL LOW (ref 4.22–5.81)
RDW: 16 % — ABNORMAL HIGH (ref 11.5–15.5)
WBC: 9.1 10*3/uL (ref 4.0–10.5)

## 2017-05-12 LAB — COMPREHENSIVE METABOLIC PANEL
ALBUMIN: 3 g/dL — AB (ref 3.5–5.0)
ALK PHOS: 48 U/L (ref 38–126)
ALT: 32 U/L (ref 17–63)
ANION GAP: 11 (ref 5–15)
AST: 99 U/L — AB (ref 15–41)
BUN: 21 mg/dL — ABNORMAL HIGH (ref 6–20)
CALCIUM: 8.8 mg/dL — AB (ref 8.9–10.3)
CO2: 24 mmol/L (ref 22–32)
CREATININE: 1.36 mg/dL — AB (ref 0.61–1.24)
Chloride: 105 mmol/L (ref 101–111)
GFR calc Af Amer: 55 mL/min — ABNORMAL LOW (ref >60)
GFR calc non Af Amer: 48 mL/min — ABNORMAL LOW (ref >60)
GLUCOSE: 108 mg/dL — AB (ref 65–99)
Potassium: 4.7 mmol/L (ref 3.5–5.1)
SODIUM: 140 mmol/L (ref 135–145)
Total Bilirubin: 0.8 mg/dL (ref 0.3–1.2)
Total Protein: 6.6 g/dL (ref 6.5–8.1)

## 2017-05-12 LAB — HEMOGLOBIN A1C
Hgb A1c MFr Bld: 5.8 % — ABNORMAL HIGH (ref 4.8–5.6)
MEAN PLASMA GLUCOSE: 119.76 mg/dL

## 2017-05-12 LAB — ECHOCARDIOGRAM COMPLETE
Height: 66 in
WEIGHTICAEL: 2400 [oz_av]

## 2017-05-12 LAB — SURGICAL PCR SCREEN
MRSA, PCR: NEGATIVE
Staphylococcus aureus: NEGATIVE

## 2017-05-12 MED ORDER — CEFAZOLIN SODIUM-DEXTROSE 2-4 GM/100ML-% IV SOLN: 2.0000 g | INTRAVENOUS | Status: DC

## 2017-05-12 MED ORDER — MORPHINE SULFATE (PF) 2 MG/ML IV SOLN
2.0000 mg | INTRAVENOUS | Status: DC | PRN
  Administered 2017-05-15: 2 mg via INTRAVENOUS
  Filled 2017-05-12: qty 1

## 2017-05-12 MED ORDER — SODIUM CHLORIDE 0.9 % IV SOLN
INTRAVENOUS | Status: DC
  Administered 2017-05-13 (×2): via INTRAVENOUS

## 2017-05-12 MED ORDER — POVIDONE-IODINE 10 % EX SWAB: 2.0000 "application " | Freq: Once | CUTANEOUS | Status: DC

## 2017-05-12 MED ORDER — CHLORHEXIDINE GLUCONATE 4 % EX LIQD
60.0000 mL | Freq: Once | CUTANEOUS | Status: AC
  Administered 2017-05-13: 4 via TOPICAL
  Filled 2017-05-12: qty 60

## 2017-05-12 MED ORDER — IOPAMIDOL (ISOVUE-370) INJECTION 76%
INTRAVENOUS | Status: AC
  Filled 2017-05-12: qty 50

## 2017-05-12 MED ORDER — PERFLUTREN LIPID MICROSPHERE
1.0000 mL | INTRAVENOUS | Status: AC | PRN
  Filled 2017-05-12: qty 10

## 2017-05-12 NOTE — Progress Notes (Signed)
VASCULAR LAB PRELIMINARY  PRELIMINARY  PRELIMINARY  PRELIMINARY  Carotid duplex completed.    Preliminary report:  1-39% ICA stenosis. Vertebral artery flow is antegrade.   Brenyn Petrey, RVT 05/12/2017, 1:57 PM

## 2017-05-12 NOTE — Progress Notes (Addendum)
  Severely displaced left subtroch femur fracture. H/o asthma, dementia, hypertension, CVA, seizure disorder who fell from wheelchair.   High risk but surgery indicated for function and palliation of pain. Have discussed case with Dr. Everardo PacificVarkey and assume care tomorrow, Plan for IM nailing of the left hip tomorrow at 10:30am.   Johnathan GalasMichael Nancy Manuele, MD [facility] [phone] (p)

## 2017-05-12 NOTE — Progress Notes (Signed)
OT Cancellation Note  Patient Details Name: Threasa BeardsJames C Mahmood MRN: [id number] DOB: 06/23/1934   Cancelled Treatment:    Reason Eval/Treat Not Completed: Medical issues which prohibited therapy  Unable to complete evaluation as pt in traction. Will follow when pt able to participate in evaluation.  Rosalio LoudHOXIE, Carlos Heber 05/12/2017, 7:50 AM

## 2017-05-12 NOTE — Progress Notes (Signed)
PT Cancellation Note  Patient Details Name: Johnathan BeardsJames C Fauver MRN: [id number] DOB: 06/23/1934   Cancelled Treatment:    Reason Eval/Treat Not Completed: Medical issues which prohibited therapy Pt with L femur fx and currently in traction. Will hold until pt medically appropriate and follow up as schedule allows.   [name], PT, DPT  Acute Rehabilitation Services  Pager: [phone]    [name] 05/12/2017, 12:24 PM

## 2017-05-12 NOTE — Progress Notes (Signed)
  Echocardiogram 2D Echocardiogram has been performed.  Roosvelt MaserLane, Joniah Bednarski F 05/12/2017, 12:58 PM

## 2017-05-12 NOTE — Progress Notes (Signed)
  Progress Note    05/12/2017 1:22 PM * No surgery found *  Subjective: Only complains of left leg pain today  Vitals:   05/12/17 0750 05/12/17 0911  BP: 94/77 100/70  Pulse:    Resp: 18 14  Temp: 97.6 F (36.4 C)   SpO2:      Physical Exam: Awake and alert Femoral pulses bilaterally are palpable  CBC    Component Value Date/Time   WBC 9.1 05/12/2017 0429   RBC 3.83 (L) 05/12/2017 0429   HGB 10.3 (L) 05/12/2017 0429   HCT 31.6 (L) 05/12/2017 0429   PLT 170 05/12/2017 0429   MCV 82.5 05/12/2017 0429   MCH 26.9 05/12/2017 0429   MCHC 32.6 05/12/2017 0429   RDW 16.0 (H) 05/12/2017 0429   LYMPHSABS 0.6 (L) 05/11/2017 1135   MONOABS 1.1 (H) 05/11/2017 1135   EOSABS 0.0 05/11/2017 1135   BASOSABS 0.0 05/11/2017 1135    BMET    Component Value Date/Time   NA 140 05/12/2017 0429   K 4.7 05/12/2017 0429   CL 105 05/12/2017 0429   CO2 24 05/12/2017 0429   GLUCOSE 108 (H) 05/12/2017 0429   BUN 21 (H) 05/12/2017 0429   CREATININE 1.36 (H) 05/12/2017 0429   CALCIUM 8.8 (L) 05/12/2017 0429   GFRNONAA 48 (L) 05/12/2017 0429   GFRAA 55 (L) 05/12/2017 0429    INR    Component Value Date/Time   INR 1.06 05/11/2017 1135     Intake/Output Summary (Last 24 hours) at 05/12/2017 1322 Last data filed at 05/12/2017 1000 Gross per 24 hour  Intake 591.25 ml  Output 400 ml  Net 191.25 ml     Assessment:  83 y.o. 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  Plan: Okay from a vascular standpoint for orthopedic surgery We will have him follow-up in 6 months with aortoiliac duplex.   Johnathan Winograd C. Randie Heinzain, MD [facility] Office: [phone] Pager: [phone]  05/12/2017 1:22 PM

## 2017-05-12 NOTE — Consult Note (Signed)
See note labeled progress note from 4/14 under my name for consult note.

## 2017-05-12 NOTE — Progress Notes (Signed)
Stroke Team Progress Note  Johnathan Mitchell is an 83 y.o. male with prior CVA with right-sided residual weakness is wheelchair-bound, dementia, hypertension, seizure who presents to the ED with acute transient aphasia, flaccidity on the right side and came to the ER as a code stroke. Per family 7 am this morning, patient was suspected and had a witnessed fall.  Family was able to get him up and put him in his wheelchair without incident.  Per family patient was fine and conversing with them normally.  Around 10:50 AM, while watching TV with his family they noticed he suddenly became aphasic and flaccid on his right upper and lower extremities.  They called immediately and upon arrival patient's SBP was in the 70s and with a bit of movement it was 90/60.  While in the ambulance en route to the hospital the patient complained of severe left leg pain and then it was noticed that he had very large hematoma in the left hip area, wincing in pain when left leg manipulated.  On arrival to the hospital patient's blood pressure was elevated at 183/169, he was more verbal without evidence of dysarthria. He continued to have right-sided weakness but no drift noted on bilateral lower extremities; equal strength with bilateral great toe raise.  STAT CT of the head revealed a chronic old left stroke and no acute changes.  He denies recent fevers, chills, nausea or vomiting; no headache or dizziness, patient has baseline dementia which may decrease the accuracy of this review of systems.  LSN:05/11/17 - 1050am tPA Given: No - Improving neurological deficits and high risk of severe hemorrhagic complications given probable left hip hematoma in the setting of labile BP with intermittent hypotension Premorbid modified Rankin scale (mRS):4   SUBJECTIVE he has just returned from testing in the vascular lab. He still has some slurred speech and right-sided weakness. He is a poor historian  OBJECTIVE Most recent Vital  Signs: Temp: 97.9 F (36.6 C) (04/15 1328) Temp Source: Oral (04/15 1328) BP: 100/76 (04/15 1328) Pulse Rate: 86 (04/15 1328) Respiratory Rate: (!) 9 O2 Saturdation: 98%  CBG (last 3)  Recent Labs    05/11/17 1136  GLUCAP 165*    Diet: Fall precautions Fall precautions Diet NPO time specified Except for: Sips with Meds   liquids  Activity: bed rest  VTE Prophylaxis: SCds  Studies: Results for orders placed or performed during the hospital encounter of 05/11/17 (from the past 24 hour(s))  Urine rapid drug screen (hosp performed)     Status: None   Collection Time: 05/11/17  5:55 PM  Result Value Ref Range   Opiates NONE DETECTED NONE DETECTED   Cocaine NONE DETECTED NONE DETECTED   Benzodiazepines NONE DETECTED NONE DETECTED   Amphetamines NONE DETECTED NONE DETECTED   Tetrahydrocannabinol NONE DETECTED NONE DETECTED   Barbiturates NONE DETECTED NONE DETECTED  Urinalysis, Routine w reflex microscopic     Status: Abnormal   Collection Time: 05/11/17  5:55 PM  Result Value Ref Range   Color, Urine YELLOW YELLOW   APPearance HAZY (A) CLEAR   Specific Gravity, Urine >1.046 (H) 1.005 - 1.030   pH 5.0 5.0 - 8.0   Glucose, UA NEGATIVE NEGATIVE mg/dL   Hgb urine dipstick LARGE (A) NEGATIVE   Bilirubin Urine NEGATIVE NEGATIVE   Ketones, ur NEGATIVE NEGATIVE mg/dL   Protein, ur 30 (A) NEGATIVE mg/dL   Nitrite NEGATIVE NEGATIVE   Leukocytes, UA NEGATIVE NEGATIVE   RBC / HPF NONE SEEN 0 -  5 RBC/hpf   WBC, UA 0-5 0 - 5 WBC/hpf   Bacteria, UA NONE SEEN NONE SEEN   Squamous Epithelial / LPF 0-5 (A) NONE SEEN   Mucus PRESENT   Surgical pcr screen     Status: None   Collection Time: 05/11/17  9:56 PM  Result Value Ref Range   MRSA, PCR NEGATIVE NEGATIVE   Staphylococcus aureus NEGATIVE NEGATIVE  Troponin I (q 6hr x 3)     Status: None   Collection Time: 05/11/17 10:21 PM  Result Value Ref Range   Troponin I <0.03 <0.03 ng/mL  Hemoglobin A1c     Status: Abnormal    Collection Time: 05/12/17  4:29 AM  Result Value Ref Range   Hgb A1c MFr Bld 5.8 (H) 4.8 - 5.6 %   Mean Plasma Glucose 119.76 mg/dL  Lipid panel     Status: None   Collection Time: 05/12/17  4:29 AM  Result Value Ref Range   Cholesterol 177 0 - 200 mg/dL   Triglycerides 69 <161 mg/dL   HDL 72 >09 mg/dL   Total CHOL/HDL Ratio 2.5 RATIO   VLDL 14 0 - 40 mg/dL   LDL Cholesterol 91 0 - 99 mg/dL  Comprehensive metabolic panel     Status: Abnormal   Collection Time: 05/12/17  4:29 AM  Result Value Ref Range   Sodium 140 135 - 145 mmol/L   Potassium 4.7 3.5 - 5.1 mmol/L   Chloride 105 101 - 111 mmol/L   CO2 24 22 - 32 mmol/L   Glucose, Bld 108 (H) 65 - 99 mg/dL   BUN 21 (H) 6 - 20 mg/dL   Creatinine, Ser 6.04 (H) 0.61 - 1.24 mg/dL   Calcium 8.8 (L) 8.9 - 10.3 mg/dL   Total Protein 6.6 6.5 - 8.1 g/dL   Albumin 3.0 (L) 3.5 - 5.0 g/dL   AST 99 (H) 15 - 41 U/L   ALT 32 17 - 63 U/L   Alkaline Phosphatase 48 38 - 126 U/L   Total Bilirubin 0.8 0.3 - 1.2 mg/dL   GFR calc non Af Amer 48 (L) >60 mL/min   GFR calc Af Amer 55 (L) >60 mL/min   Anion gap 11 5 - 15  CBC     Status: Abnormal   Collection Time: 05/12/17  4:29 AM  Result Value Ref Range   WBC 9.1 4.0 - 10.5 K/uL   RBC 3.83 (L) 4.22 - 5.81 MIL/uL   Hemoglobin 10.3 (L) 13.0 - 17.0 g/dL   HCT 54.0 (L) 98.1 - 19.1 %   MCV 82.5 78.0 - 100.0 fL   MCH 26.9 26.0 - 34.0 pg   MCHC 32.6 30.0 - 36.0 g/dL   RDW 47.8 (H) 29.5 - 62.1 %   Platelets 170 150 - 400 K/uL  Troponin I (q 6hr x 3)     Status: None   Collection Time: 05/12/17  4:29 AM  Result Value Ref Range   Troponin I <0.03 <0.03 ng/mL  Troponin I (q 6hr x 3)     Status: None   Collection Time: 05/12/17 10:32 AM  Result Value Ref Range   Troponin I <0.03 <0.03 ng/mL     Ct Chest W Contrast  Result Date: 05/11/2017 CLINICAL DATA:  TIA, hypotension, femur fracture, acute generalized abdominal pain, history hypertension, dementia, asthma, seizures EXAM: CT CHEST, ABDOMEN,  AND PELVIS WITH CONTRAST TECHNIQUE: Multidetector CT imaging of the chest, abdomen and pelvis was performed following the standard protocol during  bolus administration of intravenous contrast. Sagittal and coronal MPR images reconstructed from axial data set. CONTRAST:  ISOVUE-370 IOPAMIDOL (ISOVUE-370) INJECTION 76% IV. No oral contrast administered. COMPARISON:  None FINDINGS: CT CHEST FINDINGS Cardiovascular: Aneurysmal dilatation of the ascending thoracic aorta 4.1 cm diameter image 35. Additional dilatation of the distal aortic arch 4.1 cm diameter. No evidence of aortic dissection. Pulmonary arteries grossly patent on non targeted exam. No pericardial effusion. Mild dilatation of the RIGHT ventricle. Atherosclerotic calcifications of the coronary arteries and descending thoracic aorta. Mediastinum/Nodes: Esophagus unremarkable. Base of cervical region normal appearance. No thoracic adenopathy. Lungs/Pleura: Lungs emphysematous with scattered atelectasis in medial RIGHT lower lobe and at both lung bases. No pulmonary infiltrate, pleural effusion or pneumothorax. Musculoskeletal: Osseous demineralization. Advanced degenerative changes of BILATERAL glenohumeral joints. Sclerotic lesion within proximal LEFT humerus, question enchondroma versus bone infarct. CT ABDOMEN PELVIS FINDINGS Hepatobiliary: Gallbladder and liver normal appearance Pancreas: Normal appearance Spleen: Normal appearance Adrenals/Urinary Tract: Adrenal glands, kidneys, ureters, and bladder normal appearance Stomach/Bowel: Normal appendix in RIGHT pelvis. Stomach and bowel loops grossly unremarkable within limitations of patient motion and scattered streak artifacts. Vascular/Lymphatic: Atherosclerotic calcification and tortuosity of abdominal aorta. Aorta normal caliber. Short segment of dissection identified at dilated RIGHT common iliac artery, 19 mm diameter. 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 No adenopathy. Reproductive: Mild prostatic enlargement. Seminal vesicles unremarkable. Other: Small BILATERAL inguinal hernias containing fat. No free air or free fluid. No definite acute inflammatory process. Musculoskeletal: Diffuse osseous demineralization. Scattered degenerative disc disease changes of the thoracolumbar spine most severe at L5-S1 with mild retrolisthesis. Comminuted subtrochanteric fracture of the proximal LEFT femur extending into the lesser trochanter. Surrounding soft tissue edema/hemorrhage advanced degenerative changes of the RIGHT hip joint. IMPRESSION: No acute intra-abdominal or intrapelvic abnormalities. Displaced comminuted proximal LEFT femoral fracture extending into lesser trochanter. Degenerative changes of the thoracolumbar spine. Advanced degenerative changes of both shoulders and RIGHT hip joint. COPD changes with scattered subsegmental atelectasis. Scattered atherosclerotic disease including aneurysmal dilatation of ascending thoracic aorta, distal aortic arch, and BILATERAL internal iliac arteries, recommendation below. 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 Recommend semi-annual imaging followup by CTA or MRA and referral to cardiothoracic surgery if not already obtained. This recommendation follows 2010 ACCF/AHA/AATS/ACR/ASA/SCA/SCAI/SIR/STS/SVM Guidelines for the Diagnosis and Management of Patients With Thoracic Aortic Disease. Circulation. 2010; 121: W098-J19 Electronically Signed   By: Ulyses Southward M.D.   On: 05/11/2017 12:48   Ct Abdomen Pelvis W Contrast  Result Date: 05/11/2017 CLINICAL DATA:  TIA, hypotension, femur fracture, acute generalized abdominal pain, history hypertension, dementia, asthma, seizures EXAM: CT CHEST, ABDOMEN, AND PELVIS WITH CONTRAST TECHNIQUE: Multidetector CT imaging of the chest, abdomen and pelvis was performed following the standard protocol during bolus administration of  intravenous contrast. Sagittal and coronal MPR images reconstructed from axial data set. CONTRAST:  ISOVUE-370 IOPAMIDOL (ISOVUE-370) INJECTION 76% IV. No oral contrast administered. COMPARISON:  None FINDINGS: CT CHEST FINDINGS Cardiovascular: Aneurysmal dilatation of the ascending thoracic aorta 4.1 cm diameter image 35. Additional dilatation of the distal aortic arch 4.1 cm diameter. No evidence of aortic dissection. Pulmonary arteries grossly patent on non targeted exam. No pericardial effusion. Mild dilatation of the RIGHT ventricle. Atherosclerotic calcifications of the coronary arteries and descending thoracic aorta. Mediastinum/Nodes: Esophagus unremarkable. Base of cervical region normal appearance. No thoracic adenopathy. Lungs/Pleura: Lungs emphysematous with scattered atelectasis in medial RIGHT lower lobe and at both lung bases.  No pulmonary infiltrate, pleural effusion or pneumothorax. Musculoskeletal: Osseous demineralization. Advanced degenerative changes of BILATERAL glenohumeral joints. Sclerotic lesion within proximal LEFT humerus, question enchondroma versus bone infarct. CT ABDOMEN PELVIS FINDINGS Hepatobiliary: Gallbladder and liver normal appearance Pancreas: Normal appearance Spleen: Normal appearance Adrenals/Urinary Tract: Adrenal glands, kidneys, ureters, and bladder normal appearance Stomach/Bowel: Normal appendix in RIGHT pelvis. Stomach and bowel loops grossly unremarkable within limitations of patient motion and scattered streak artifacts. Vascular/Lymphatic: Atherosclerotic calcification and tortuosity of abdominal aorta. Aorta normal caliber. Short segment of dissection identified at dilated RIGHT common iliac artery, 19 mm diameter. 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 No adenopathy. Reproductive: Mild prostatic enlargement. Seminal vesicles unremarkable. Other: Small BILATERAL inguinal hernias containing  fat. No free air or free fluid. No definite acute inflammatory process. Musculoskeletal: Diffuse osseous demineralization. Scattered degenerative disc disease changes of the thoracolumbar spine most severe at L5-S1 with mild retrolisthesis. Comminuted subtrochanteric fracture of the proximal LEFT femur extending into the lesser trochanter. Surrounding soft tissue edema/hemorrhage advanced degenerative changes of the RIGHT hip joint. IMPRESSION: No acute intra-abdominal or intrapelvic abnormalities. Displaced comminuted proximal LEFT femoral fracture extending into lesser trochanter. Degenerative changes of the thoracolumbar spine. Advanced degenerative changes of both shoulders and RIGHT hip joint. COPD changes with scattered subsegmental atelectasis. Scattered atherosclerotic disease including aneurysmal dilatation of ascending thoracic aorta, distal aortic arch, and BILATERAL internal iliac arteries, recommendation below. 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 Recommend semi-annual imaging followup by CTA or MRA and referral to cardiothoracic surgery if not already obtained. This recommendation follows 2010 ACCF/AHA/AATS/ACR/ASA/SCA/SCAI/SIR/STS/SVM Guidelines for the Diagnosis and Management of Patients With Thoracic Aortic Disease. Circulation. 2010; 121: [id number] Electronically Signed   By: Ulyses Southward M.D.   On: 05/11/2017 12:48   Dg Femur Port 1v Left  Result Date: 05/11/2017 CLINICAL DATA:  Known fracture, following traction EXAM: LEFT FEMUR PORTABLE 1 VIEW COMPARISON:  Study obtained earlier in the day FINDINGS: Frontal view obtained. Comminuted fracture of the proximal left femoral diaphysis noted without appreciable change. There is avulsion of the lesser trochanter. There is medial displacement of the distal fracture fragment with respect to proximal fragment with 8.7 cm of overriding of fracture fragments. No distal fracture. Advanced arthropathy noted in the left  knee joint region. Mild narrowing right hip joint. IMPRESSION: Persistent comminuted fracture proximal femur with a 0.7 cm of overriding of fracture fragments. Avulsion of the lesser trochanter. Alignment similar to pre traction images. Advanced arthropathy in the knee joint. No dislocation evident. Electronically Signed   By: Bretta Bang III M.D.   On: 05/11/2017 15:27   Dg Femur Port Min 2 Views Left  Result Date: 05/11/2017 CLINICAL DATA:  Larey Seat today poor historian, history hypertension, asthma, stroke, dementia, former smoker EXAM: LEFT FEMUR PORTABLE 2 VIEWS COMPARISON:  None FINDINGS: Osseous demineralization. Displaced oblique fracture of the proximal LEFT femoral diaphysis extending to involve the lesser trochanter. LEFT femoral head, neck and greater trochanter appear intact. LEFT hip joint space preserved without dislocation. Advanced tricompartmental osteoarthritic changes of the LEFT knee with joint space narrowing and spur formation. Calcified debris posteriorly at the LEFT knee joint. Distal LEFT femur intact. IMPRESSION: Displaced oblique fracture of proximal LEFT femoral diaphysis extending into lesser trochanter. Osseous demineralization with advanced degenerative changes of the LEFT knee joint. Electronically Signed   By: Ulyses Southward M.D.   On: 05/11/2017 13:33   Ct Head Code Stroke Wo Contrast  Result Date: 05/11/2017 CLINICAL DATA:  Code stroke. 83 year old male with right side weakness. EXAM: CT HEAD WITHOUT CONTRAST TECHNIQUE: Contiguous axial images were obtained from the base of the skull through the vertex without intravenous contrast. COMPARISON:  Head CTs 08/21/2016 and earlier. FINDINGS: Brain: Chronic left MCA territory encephalomalacia, confluent left hemisphere white matter hypodensity, and ex vacuo enlargement of the left lateral ventricle appears stable since July 2018. There is associated confluent hypodensity in the left cauda thalamic groove and posterior limb left  internal capsule with evidence of Wallerian degeneration at the left midbrain and brainstem. Moderate contralateral right hemisphere white matter hypodensity also appears stable. No midline shift, ventriculomegaly, mass effect, evidence of mass lesion, intracranial hemorrhage or evidence of cortically based acute infarction. No other cortical encephalomalacia identified. Vascular: Calcified atherosclerosis at the skull base. Intracranial artery tortuosity. No suspicious intracranial vascular hyperdensity. Skull: No acute osseous abnormality identified. Partially visible advanced upper cervical spine degeneration. Sinuses/Orbits: Visualized paranasal sinuses and mastoids are stable and well pneumatized. Other: Visualized orbits and scalp soft tissues are within normal limits. ASPECTS [facility] Stroke Program Early CT Score) Total score (0-10 with 10 being normal): 10 (chronic left hemisphere encephalomalacia). IMPRESSION: 1. No acute intracranial abnormality. Pronounced chronic encephalomalacia in the left hemisphere with left deep gray matter and brainstem Wallerian degeneration appears stable since 2018. 2. No acute intracranial hemorrhage. ASPECTS is 10 (chronic left hemisphere encephalomalacia). 3. Study discussed by telephone with Dr. Caryl Pina on 05/11/2017 at 12:01 . Electronically Signed   By: Odessa Fleming M.D.   On: 05/11/2017 12:02    Physical Exam:     Frail cachectic elderly African-American male not in distress. He has left lower extremity in traction. . Afebrile. Head is nontraumatic. Neck is supple without bruit.    Cardiac exam no murmur or gallop. Lungs are clear to auscultation. Distal pulses are well felt. Neurological Exam :  Awake alert oriented to time only. Diminished attention, registration and recall. Follows only simple midline and one-step commands. Extraocular moments are full range without nystagmus. Blinks to threat bilaterally. Fundi not visualized. Pupils equal reactive. Face is  symmetric. Tongue midline. Speech is slightly slurred and hesitant but no aphasia. Motor system exam symmetric strength in upper extremity is with mild 4/5 weakness proximally and distally. Left lower extremity movement testing is limited due to recent hip fracture and traction. Strength appears normal in the right lower extremity. Sensation appears preserved bilaterally.plantars are both downgoing. Gait not tested. ASSESSMENT Johnathan Mitchell is a 83 y.o. male with prior left hemispheric stroke presents with transient speech difficulties and right-sided weakness likely due to (TIA versus small infarct in setting of hypertension. Recent left femur fracture following fall. Vascular risk factors of hypertension, hyperlipidemia and previous stroke Hospital day # 1  TREATMENT/PLAN  check CT angiogram to evaluate intracranial and extracranial vasculature. MRI cannot be done due to his traction. Start aspirin for stroke prevention and statin for elevated lipids. Therapy consults.Stroke team will follow. Discussed with Dr. Renford Dills.no family available at the bedside for discussion. Greater than 50% time during this 20 family and visit was spent on counseling and coordination of care about his TIA versus small stroke and answered questions  Delia Heady, MD [facility] Pager: [phone] 05/12/2017 4:28 PM

## 2017-05-12 NOTE — Progress Notes (Signed)
PROGRESS NOTE    Johnathan Mitchell  ZOX:[id number] DOB: 11-05-1934 DOA: 05/11/2017 PCP: System, Provider Not In   Brief Narrative: Patient is a 83 year old male with past medical history of asthma, dementia, hypertension, CVA, seizure disorder who apparently fell from wheelchair at his home .Patient immediately developed pain on his left thigh .  Found to have slurred speech , weakness on the right upper and lower extremities  so he was brought to the emergency department.  He was also noted to be hypotensive on presentation.  Imagings done in the ED was a suggestive  of left traumatic femur fracture.  Initial presentation was suspicious of stroke.  Code stroke was called.  CT of the brain did not show any acute intracranial abnormalities.  Orthopedics, neurology and vascular surgery consulted.  Assessment & Plan:   Principal Problem:   Stroke [facility]) Active Problems:   Benign essential HTN   Femur fracture (HCC)  Suspect CVA: Currently patient on his baseline.  Neurology following.  CT imagings did not show any acute intracranial abnormalities.  Patient cannot go for MRI/MRA of the brain because of the femoral fracture.  Presented with slurred speech and right sided weakness. Currently he has good strength on  upper extremities and his speech is on baseline at present. Patient has history of CVA in the past with residual right sided weakness.  He is wheelchair-bound.  Left Femoral fracture:Imagings showed displaced comminuted proximal LEFT femoral fracture extending into lesser trochanter.  Orthopedics following.  Planned for intervention tomorrow. PT/OT after surgery. DVT ppx with aspirin.  Right iliac dissection:CT imaging also showed short segment of dissection within a dilated RIGHT common iliac artery.  Also showed bilateral small hypogastric aneurysms .Vascular surgery consulted.  Vascular surgery clear for orthopedic surgery.  Vascular surgery recommended to follow-up as an outpatient in 6  months with the aortoiliac duplex.  Acute kidney injury: Most likely secondary to hypotension on presentation.  We will continue gentle IV fluids.  Will check BMP tomorrow.  HTN: Currently blood pressure stable.  Hypotensive on presentation.  Lisinopril held.  History of anxiety: Continue Lexapro and Depakote  DVT prophylaxis: SCD,Aspirin Code Status: Full Family Communication: None present at the bedside Disposition Plan:  To be determined   Consultants: Neurology, vascular surgery, orthopedics   Procedures:none  Antimicrobials:  None  Subjective:  Patient seen and examined the bedside this morning.  Complaining of left  thigh pain.  Otherwise remains comfortable.  Mental status is on baseline.    Objective: Vitals:   05/12/17 0233 05/12/17 0750 05/12/17 0911 05/12/17 1328  BP: 97/73 94/77 100/70 100/76  Pulse: 97   86  Resp: 14 18 14  (!) 9  Temp:  97.6 F (36.4 C)  97.9 F (36.6 C)  TempSrc:  Oral  Oral  SpO2: 100%   98%  Weight:      Height:        Intake/Output Summary (Last 24 hours) at 05/12/2017 1334 Last data filed at 05/12/2017 1000 Gross per 24 hour  Intake 591.25 ml  Output 400 ml  Net 191.25 ml   Filed Weights   05/11/17 1244  Weight: 68 kg (150 lb)    Examination:  General exam: Appears calm and comfortable ,Not in distress,average built HEENT:PERRL,Oral mucosa moist, Ear/Nose normal on gross exam Respiratory system: Bilateral equal air entry, normal vesicular breath sounds, no wheezes or crackles  Cardiovascular system: S1 & S2 heard, RRR. No JVD, murmurs, rubs, gallops or clicks. No pedal edema. Gastrointestinal system:  Abdomen is nondistended, soft and nontender. No organomegaly or masses felt. Normal bowel sounds heard. Central nervous system: Alert and oriented.  Right-sided residual weakness  extremities: No edema, no clubbing ,no cyanosis, distal peripheral pulses palpable.Traction applied on the left lower extremity, left thigh  tenderness Skin: No rashes, lesions or ulcers,no icterus ,no pallor MSK: Normal muscle bulk,tone ,power      Data Reviewed: I have personally reviewed following labs and imaging studies  CBC: Recent Labs  Lab 05/11/17 1135 05/11/17 1146 05/12/17 0429  WBC 12.6*  --  9.1  NEUTROABS 10.9*  --   --   HGB 11.7* 12.6* 10.3*  HCT 34.8* 37.0* 31.6*  MCV 80.6  --  82.5  PLT 203  --  170   Basic Metabolic Panel: Recent Labs  Lab 05/11/17 1135 05/11/17 1146 05/12/17 0429  NA 136 137 140  K 5.1 5.2* 4.7  CL 103 104 105  CO2 21*  --  24  GLUCOSE 165* 174* 108*  BUN 17 20 21*  CREATININE 1.48* 1.30* 1.36*  CALCIUM 9.4  --  8.8*   GFR: Estimated Creatinine Clearance: 39.1 mL/min (A) (by C-G formula based on SCr of 1.36 mg/dL (H)). Liver Function Tests: Recent Labs  Lab 05/11/17 1135 05/12/17 0429  AST 45* 99*  ALT 25 32  ALKPHOS 59 48  BILITOT 0.6 0.8  PROT 7.5 6.6  ALBUMIN 3.4* 3.0*   No results for input(s): LIPASE, AMYLASE in the last 168 hours. No results for input(s): AMMONIA in the last 168 hours. Coagulation Profile: Recent Labs  Lab 05/11/17 1135  INR 1.06   Cardiac Enzymes: Recent Labs  Lab 05/11/17 2221 05/12/17 0429 05/12/17 1032  TROPONINI <0.03 <0.03 <0.03   BNP (last 3 results) No results for input(s): PROBNP in the last 8760 hours. HbA1C: Recent Labs    05/12/17 0429  HGBA1C 5.8*   CBG: Recent Labs  Lab 05/11/17 1136  GLUCAP 165*   Lipid Profile: Recent Labs    05/12/17 0429  CHOL 177  HDL 72  LDLCALC 91  TRIG 69  CHOLHDL 2.5   Thyroid Function Tests: No results for input(s): TSH, T4TOTAL, FREET4, T3FREE, THYROIDAB in the last 72 hours. Anemia Panel: No results for input(s): VITAMINB12, FOLATE, FERRITIN, TIBC, IRON, RETICCTPCT in the last 72 hours. Sepsis Labs: No results for input(s): PROCALCITON, LATICACIDVEN in the last 168 hours.  Recent Results (from the past 240 hour(s))  Surgical pcr screen     Status: None    Collection Time: 05/11/17  9:56 PM  Result Value Ref Range Status   MRSA, PCR NEGATIVE NEGATIVE Final   Staphylococcus aureus NEGATIVE NEGATIVE Final    Comment: (NOTE) The Xpert SA Assay (FDA approved for NASAL specimens in patients 43 years of age and older), is one component of a comprehensive surveillance program. It is not intended to diagnose infection nor to guide or monitor treatment. Performed at [facility], [address], [place], [place] [address]          Radiology Studies: Ct Chest W Contrast  Result Date: 05/11/2017 CLINICAL DATA:  TIA, hypotension, femur fracture, acute generalized abdominal pain, history hypertension, dementia, asthma, seizures EXAM: CT CHEST, ABDOMEN, AND PELVIS WITH CONTRAST TECHNIQUE: Multidetector CT imaging of the chest, abdomen and pelvis was performed following the standard protocol during bolus administration of intravenous contrast. Sagittal and coronal MPR images reconstructed from axial data set. CONTRAST:  ISOVUE-370 IOPAMIDOL (ISOVUE-370) INJECTION 76% IV. No oral contrast administered.  COMPARISON:  None FINDINGS: CT CHEST FINDINGS Cardiovascular: Aneurysmal dilatation of the ascending thoracic aorta 4.1 cm diameter image 35. Additional dilatation of the distal aortic arch 4.1 cm diameter. No evidence of aortic dissection. Pulmonary arteries grossly patent on non targeted exam. No pericardial effusion. Mild dilatation of the RIGHT ventricle. Atherosclerotic calcifications of the coronary arteries and descending thoracic aorta. Mediastinum/Nodes: Esophagus unremarkable. Base of cervical region normal appearance. No thoracic adenopathy. Lungs/Pleura: Lungs emphysematous with scattered atelectasis in medial RIGHT lower lobe and at both lung bases. No pulmonary infiltrate, pleural effusion or pneumothorax. Musculoskeletal: Osseous demineralization. Advanced degenerative changes of BILATERAL glenohumeral joints. Sclerotic lesion within  proximal LEFT humerus, question enchondroma versus bone infarct. CT ABDOMEN PELVIS FINDINGS Hepatobiliary: Gallbladder and liver normal appearance Pancreas: Normal appearance Spleen: Normal appearance Adrenals/Urinary Tract: Adrenal glands, kidneys, ureters, and bladder normal appearance Stomach/Bowel: Normal appendix in RIGHT pelvis. Stomach and bowel loops grossly unremarkable within limitations of patient motion and scattered streak artifacts. Vascular/Lymphatic: Atherosclerotic calcification and tortuosity of abdominal aorta. Aorta normal caliber. Short segment of dissection identified at dilated RIGHT common iliac artery, 19 mm diameter. 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 No adenopathy. Reproductive: Mild prostatic enlargement. Seminal vesicles unremarkable. Other: Small BILATERAL inguinal hernias containing fat. No free air or free fluid. No definite acute inflammatory process. Musculoskeletal: Diffuse osseous demineralization. Scattered degenerative disc disease changes of the thoracolumbar spine most severe at L5-S1 with mild retrolisthesis. Comminuted subtrochanteric fracture of the proximal LEFT femur extending into the lesser trochanter. Surrounding soft tissue edema/hemorrhage advanced degenerative changes of the RIGHT hip joint. IMPRESSION: No acute intra-abdominal or intrapelvic abnormalities. Displaced comminuted proximal LEFT femoral fracture extending into lesser trochanter. Degenerative changes of the thoracolumbar spine. Advanced degenerative changes of both shoulders and RIGHT hip joint. COPD changes with scattered subsegmental atelectasis. Scattered atherosclerotic disease including aneurysmal dilatation of ascending thoracic aorta, distal aortic arch, and BILATERAL internal iliac arteries, recommendation below. 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 Recommend semi-annual imaging followup  by CTA or MRA and referral to cardiothoracic surgery if not already obtained. This recommendation follows 2010 ACCF/AHA/AATS/ACR/ASA/SCA/SCAI/SIR/STS/SVM Guidelines for the Diagnosis and Management of Patients With Thoracic Aortic Disease. Circulation. 2010; 121: R604-V40 Electronically Signed   By: Ulyses Southward M.D.   On: 05/11/2017 12:48   Ct Abdomen Pelvis W Contrast  Result Date: 05/11/2017 CLINICAL DATA:  TIA, hypotension, femur fracture, acute generalized abdominal pain, history hypertension, dementia, asthma, seizures EXAM: CT CHEST, ABDOMEN, AND PELVIS WITH CONTRAST TECHNIQUE: Multidetector CT imaging of the chest, abdomen and pelvis was performed following the standard protocol during bolus administration of intravenous contrast. Sagittal and coronal MPR images reconstructed from axial data set. CONTRAST:  ISOVUE-370 IOPAMIDOL (ISOVUE-370) INJECTION 76% IV. No oral contrast administered. COMPARISON:  None FINDINGS: CT CHEST FINDINGS Cardiovascular: Aneurysmal dilatation of the ascending thoracic aorta 4.1 cm diameter image 35. Additional dilatation of the distal aortic arch 4.1 cm diameter. No evidence of aortic dissection. Pulmonary arteries grossly patent on non targeted exam. No pericardial effusion. Mild dilatation of the RIGHT ventricle. Atherosclerotic calcifications of the coronary arteries and descending thoracic aorta. Mediastinum/Nodes: Esophagus unremarkable. Base of cervical region normal appearance. No thoracic adenopathy. Lungs/Pleura: Lungs emphysematous with scattered atelectasis in medial RIGHT lower lobe and at both lung bases. No pulmonary infiltrate, pleural effusion or pneumothorax. Musculoskeletal: Osseous demineralization. Advanced degenerative changes of BILATERAL glenohumeral joints. Sclerotic lesion within proximal LEFT humerus, question enchondroma versus bone infarct.  CT ABDOMEN PELVIS FINDINGS Hepatobiliary: Gallbladder and liver normal appearance Pancreas: Normal  appearance Spleen: Normal appearance Adrenals/Urinary Tract: Adrenal glands, kidneys, ureters, and bladder normal appearance Stomach/Bowel: Normal appendix in RIGHT pelvis. Stomach and bowel loops grossly unremarkable within limitations of patient motion and scattered streak artifacts. Vascular/Lymphatic: Atherosclerotic calcification and tortuosity of abdominal aorta. Aorta normal caliber. Short segment of dissection identified at dilated RIGHT common iliac artery, 19 mm diameter. 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 No adenopathy. Reproductive: Mild prostatic enlargement. Seminal vesicles unremarkable. Other: Small BILATERAL inguinal hernias containing fat. No free air or free fluid. No definite acute inflammatory process. Musculoskeletal: Diffuse osseous demineralization. Scattered degenerative disc disease changes of the thoracolumbar spine most severe at L5-S1 with mild retrolisthesis. Comminuted subtrochanteric fracture of the proximal LEFT femur extending into the lesser trochanter. Surrounding soft tissue edema/hemorrhage advanced degenerative changes of the RIGHT hip joint. IMPRESSION: No acute intra-abdominal or intrapelvic abnormalities. Displaced comminuted proximal LEFT femoral fracture extending into lesser trochanter. Degenerative changes of the thoracolumbar spine. Advanced degenerative changes of both shoulders and RIGHT hip joint. COPD changes with scattered subsegmental atelectasis. Scattered atherosclerotic disease including aneurysmal dilatation of ascending thoracic aorta, distal aortic arch, and BILATERAL internal iliac arteries, recommendation below. 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 Recommend semi-annual imaging followup by CTA or MRA and referral to cardiothoracic surgery if not already obtained. This recommendation follows 2010 ACCF/AHA/AATS/ACR/ASA/SCA/SCAI/SIR/STS/SVM Guidelines  for the Diagnosis and Management of Patients With Thoracic Aortic Disease. Circulation. 2010; 121: N562-Z30 Electronically Signed   By: Ulyses Southward M.D.   On: 05/11/2017 12:48   Dg Femur Port 1v Left  Result Date: 05/11/2017 CLINICAL DATA:  Known fracture, following traction EXAM: LEFT FEMUR PORTABLE 1 VIEW COMPARISON:  Study obtained earlier in the day FINDINGS: Frontal view obtained. Comminuted fracture of the proximal left femoral diaphysis noted without appreciable change. There is avulsion of the lesser trochanter. There is medial displacement of the distal fracture fragment with respect to proximal fragment with 8.7 cm of overriding of fracture fragments. No distal fracture. Advanced arthropathy noted in the left knee joint region. Mild narrowing right hip joint. IMPRESSION: Persistent comminuted fracture proximal femur with a 0.7 cm of overriding of fracture fragments. Avulsion of the lesser trochanter. Alignment similar to pre traction images. Advanced arthropathy in the knee joint. No dislocation evident. Electronically Signed   By: Bretta Bang III M.D.   On: 05/11/2017 15:27   Dg Femur Port Min 2 Views Left  Result Date: 05/11/2017 CLINICAL DATA:  Larey Seat today poor historian, history hypertension, asthma, stroke, dementia, former smoker EXAM: LEFT FEMUR PORTABLE 2 VIEWS COMPARISON:  None FINDINGS: Osseous demineralization. Displaced oblique fracture of the proximal LEFT femoral diaphysis extending to involve the lesser trochanter. LEFT femoral head, neck and greater trochanter appear intact. LEFT hip joint space preserved without dislocation. Advanced tricompartmental osteoarthritic changes of the LEFT knee with joint space narrowing and spur formation. Calcified debris posteriorly at the LEFT knee joint. Distal LEFT femur intact. IMPRESSION: Displaced oblique fracture of proximal LEFT femoral diaphysis extending into lesser trochanter. Osseous demineralization with advanced degenerative  changes of the LEFT knee joint. Electronically Signed   By: Ulyses Southward M.D.   On: 05/11/2017 13:33   Ct Head Code Stroke Wo Contrast  Result Date: 05/11/2017 CLINICAL DATA:  Code stroke. 83 year old male with right side weakness. EXAM: CT HEAD WITHOUT CONTRAST TECHNIQUE: Contiguous axial images were obtained from the  base of the skull through the vertex without intravenous contrast. COMPARISON:  Head CTs 08/21/2016 and earlier. FINDINGS: Brain: Chronic left MCA territory encephalomalacia, confluent left hemisphere white matter hypodensity, and ex vacuo enlargement of the left lateral ventricle appears stable since July 2018. There is associated confluent hypodensity in the left cauda thalamic groove and posterior limb left internal capsule with evidence of Wallerian degeneration at the left midbrain and brainstem. Moderate contralateral right hemisphere white matter hypodensity also appears stable. No midline shift, ventriculomegaly, mass effect, evidence of mass lesion, intracranial hemorrhage or evidence of cortically based acute infarction. No other cortical encephalomalacia identified. Vascular: Calcified atherosclerosis at the skull base. Intracranial artery tortuosity. No suspicious intracranial vascular hyperdensity. Skull: No acute osseous abnormality identified. Partially visible advanced upper cervical spine degeneration. Sinuses/Orbits: Visualized paranasal sinuses and mastoids are stable and well pneumatized. Other: Visualized orbits and scalp soft tissues are within normal limits. ASPECTS [facility]([place] Stroke Program Early CT Score) Total score (0-10 with 10 being normal): 10 (chronic left hemisphere encephalomalacia). IMPRESSION: 1. No acute intracranial abnormality. Pronounced chronic encephalomalacia in the left hemisphere with left deep gray matter and brainstem Wallerian degeneration appears stable since 2018. 2. No acute intracranial hemorrhage. ASPECTS is 10 (chronic left hemisphere  encephalomalacia). 3. Study discussed by telephone with Dr. Caryl PinaEric Lindzen on 05/11/2017 at 12:01 . Electronically Signed   By: Odessa FlemingH  Hall M.D.   On: 05/11/2017 12:02        Scheduled Meds: .  stroke: mapping our early stages of recovery book   Does not apply Once  . aspirin  300 mg Rectal Daily   Or  . aspirin  325 mg Oral Daily  . atorvastatin  80 mg Oral q1800  . divalproex  500 mg Oral QHS  . escitalopram  10 mg Oral Daily   Continuous Infusions:   LOS: 1 day    Time spent: 25 mins.      Burnadette PopAmrit Sorcha Rotunno, MD [facility] Pager [phone]  If 7PM-7AM, please contact night-coverage [email or web address] Password [email or web address] 05/12/2017, 1:34 PM

## 2017-05-12 NOTE — Progress Notes (Signed)
Patient arrived from the ed on bed with buck's traction applied to bed frame and 5 pound weight.  IV in left AC was infiltrated.  IV team called to start another.  Patient alert to self and denies pain.  Progressive care monitor at bedside, will continue to monitor.

## 2017-05-12 NOTE — Progress Notes (Signed)
Patient not able to be seen as was in Ultrasound and off floor both times I went to examine them.  Plan for OR tomorrow with Dr. Carola FrostHandy 4/16.

## 2017-05-13 ENCOUNTER — Inpatient Hospital Stay (HOSPITAL_COMMUNITY): Payer: Medicare Other | Admitting: Certified Registered Nurse Anesthetist

## 2017-05-13 ENCOUNTER — Inpatient Hospital Stay (HOSPITAL_COMMUNITY): Payer: Medicare Other

## 2017-05-13 ENCOUNTER — Encounter (HOSPITAL_COMMUNITY)
Admission: EM | Disposition: A | Discharge status: Skilled Nursing Facility | Payer: Self-pay | Source: Home / Self Care | Attending: Internal Medicine

## 2017-05-13 ENCOUNTER — Telehealth: Payer: Self-pay | Admitting: Vascular Surgery

## 2017-05-13 HISTORY — PX: FEMUR IM NAIL: SHX1597

## 2017-05-13 LAB — CBC
HCT: 28.2 % — ABNORMAL LOW (ref 39.0–52.0)
Hemoglobin: 8.9 g/dL — ABNORMAL LOW (ref 13.0–17.0)
MCH: 26.4 pg (ref 26.0–34.0)
MCHC: 31.6 g/dL (ref 30.0–36.0)
MCV: 83.7 fL (ref 78.0–100.0)
PLATELETS: 148 10*3/uL — AB (ref 150–400)
RBC: 3.37 MIL/uL — AB (ref 4.22–5.81)
RDW: 15.9 % — ABNORMAL HIGH (ref 11.5–15.5)
WBC: 9.3 10*3/uL (ref 4.0–10.5)

## 2017-05-13 LAB — BASIC METABOLIC PANEL
Anion gap: 8 (ref 5–15)
BUN: 19 mg/dL (ref 6–20)
CHLORIDE: 107 mmol/L (ref 101–111)
CO2: 24 mmol/L (ref 22–32)
CREATININE: 0.91 mg/dL (ref 0.61–1.24)
Calcium: 8.5 mg/dL — ABNORMAL LOW (ref 8.9–10.3)
GFR calc non Af Amer: >60 mL/min (ref >60)
GLUCOSE: 109 mg/dL — AB (ref 65–99)
Potassium: 4.8 mmol/L (ref 3.5–5.1)
Sodium: 139 mmol/L (ref 135–145)

## 2017-05-13 LAB — ABO/RH: ABO/RH(D): A POS

## 2017-05-13 SURGERY — INSERTION, INTRAMEDULLARY ROD, FEMUR
Anesthesia: General | Laterality: Left

## 2017-05-13 MED ORDER — DEXAMETHASONE SODIUM PHOSPHATE 10 MG/ML IJ SOLN
INTRAMUSCULAR | Status: DC | PRN
  Administered 2017-05-13: 10 mg via INTRAVENOUS

## 2017-05-13 MED ORDER — METOCLOPRAMIDE HCL 5 MG/ML IJ SOLN: 5.0000 mg | Freq: Three times a day (TID) | INTRAMUSCULAR | Status: DC | PRN

## 2017-05-13 MED ORDER — FENTANYL CITRATE (PF) 100 MCG/2ML IJ SOLN
INTRAMUSCULAR | Status: DC | PRN
  Administered 2017-05-13: 100 ug via INTRAVENOUS

## 2017-05-13 MED ORDER — PHENYLEPHRINE HCL 10 MG/ML IJ SOLN
INTRAVENOUS | Status: DC | PRN
  Administered 2017-05-13: 50 ug/min via INTRAVENOUS

## 2017-05-13 MED ORDER — MEPERIDINE HCL 50 MG/ML IJ SOLN: 6.2500 mg | INTRAMUSCULAR | Status: DC | PRN

## 2017-05-13 MED ORDER — IOPAMIDOL (ISOVUE-370) INJECTION 76%
50.0000 mL | Freq: Once | INTRAVENOUS | Status: AC | PRN
  Administered 2017-05-13: 50 mL via INTRAVENOUS

## 2017-05-13 MED ORDER — PROPOFOL 10 MG/ML IV BOLUS
INTRAVENOUS | Status: AC
  Filled 2017-05-13: qty 20

## 2017-05-13 MED ORDER — ONDANSETRON HCL 4 MG PO TABS: 4.0000 mg | ORAL_TABLET | Freq: Four times a day (QID) | ORAL | Status: DC | PRN

## 2017-05-13 MED ORDER — METOCLOPRAMIDE HCL 10 MG PO TABS: 5.0000 mg | ORAL_TABLET | Freq: Three times a day (TID) | ORAL | Status: DC | PRN

## 2017-05-13 MED ORDER — ONDANSETRON HCL 4 MG/2ML IJ SOLN
INTRAMUSCULAR | Status: DC | PRN
  Administered 2017-05-13: 4 mg via INTRAVENOUS

## 2017-05-13 MED ORDER — DOCUSATE SODIUM 100 MG PO CAPS
100.0000 mg | ORAL_CAPSULE | Freq: Two times a day (BID) | ORAL | Status: DC
  Administered 2017-05-13 – 2017-05-16 (×7): 100 mg via ORAL
  Filled 2017-05-13 (×8): qty 1

## 2017-05-13 MED ORDER — IOPAMIDOL (ISOVUE-370) INJECTION 76%
INTRAVENOUS | Status: AC
  Filled 2017-05-13: qty 50

## 2017-05-13 MED ORDER — CLINDAMYCIN PHOSPHATE 600 MG/50ML IV SOLN
600.0000 mg | Freq: Four times a day (QID) | INTRAVENOUS | Status: AC
  Administered 2017-05-13 – 2017-05-14 (×3): 600 mg via INTRAVENOUS
  Filled 2017-05-13 (×3): qty 50

## 2017-05-13 MED ORDER — FENTANYL CITRATE (PF) 250 MCG/5ML IJ SOLN
INTRAMUSCULAR | Status: AC
  Filled 2017-05-13: qty 5

## 2017-05-13 MED ORDER — PHENYLEPHRINE 40 MCG/ML (10ML) SYRINGE FOR IV PUSH (FOR BLOOD PRESSURE SUPPORT)
PREFILLED_SYRINGE | INTRAVENOUS | Status: AC
  Filled 2017-05-13: qty 10

## 2017-05-13 MED ORDER — EPHEDRINE 5 MG/ML INJ
INTRAVENOUS | Status: AC
  Filled 2017-05-13: qty 10

## 2017-05-13 MED ORDER — ROCURONIUM BROMIDE 10 MG/ML (PF) SYRINGE
PREFILLED_SYRINGE | INTRAVENOUS | Status: DC | PRN
  Administered 2017-05-13: 70 mg via INTRAVENOUS

## 2017-05-13 MED ORDER — PROPOFOL 10 MG/ML IV BOLUS
INTRAVENOUS | Status: DC | PRN
  Administered 2017-05-13: 100 mg via INTRAVENOUS

## 2017-05-13 MED ORDER — ONDANSETRON HCL 4 MG/2ML IJ SOLN: 4.0000 mg | Freq: Once | INTRAMUSCULAR | Status: DC | PRN

## 2017-05-13 MED ORDER — GLYCOPYRROLATE 0.2 MG/ML IV SOSY
PREFILLED_SYRINGE | INTRAVENOUS | Status: DC | PRN
  Administered 2017-05-13: 0.6 mg via INTRAVENOUS

## 2017-05-13 MED ORDER — LACTATED RINGERS IV SOLN: INTRAVENOUS | Status: DC

## 2017-05-13 MED ORDER — NEOSTIGMINE METHYLSULFATE 5 MG/5ML IV SOSY
PREFILLED_SYRINGE | INTRAVENOUS | Status: DC | PRN
  Administered 2017-05-13: 4 mg via INTRAVENOUS

## 2017-05-13 MED ORDER — CLINDAMYCIN PHOSPHATE 900 MG/50ML IV SOLN
900.0000 mg | INTRAVENOUS | Status: AC
  Administered 2017-05-13: 900 mg via INTRAVENOUS
  Filled 2017-05-13: qty 50

## 2017-05-13 MED ORDER — ROCURONIUM BROMIDE 10 MG/ML (PF) SYRINGE
PREFILLED_SYRINGE | INTRAVENOUS | Status: AC
  Filled 2017-05-13: qty 5

## 2017-05-13 MED ORDER — LIDOCAINE 2% (20 MG/ML) 5 ML SYRINGE
INTRAMUSCULAR | Status: DC | PRN
  Administered 2017-05-13: 100 mg via INTRAVENOUS

## 2017-05-13 MED ORDER — EPHEDRINE SULFATE-NACL 50-0.9 MG/10ML-% IV SOSY
PREFILLED_SYRINGE | INTRAVENOUS | Status: DC | PRN
  Administered 2017-05-13 (×4): 10 mg via INTRAVENOUS

## 2017-05-13 MED ORDER — ONDANSETRON HCL 4 MG/2ML IJ SOLN: 4.0000 mg | Freq: Four times a day (QID) | INTRAMUSCULAR | Status: DC | PRN

## 2017-05-13 MED ORDER — FENTANYL CITRATE (PF) 100 MCG/2ML IJ SOLN: 25.0000 ug | INTRAMUSCULAR | Status: DC | PRN

## 2017-05-13 MED ORDER — LIDOCAINE 2% (20 MG/ML) 5 ML SYRINGE
INTRAMUSCULAR | Status: AC
  Filled 2017-05-13: qty 5

## 2017-05-13 MED ORDER — DEXAMETHASONE SODIUM PHOSPHATE 10 MG/ML IJ SOLN
INTRAMUSCULAR | Status: AC
  Filled 2017-05-13: qty 1

## 2017-05-13 MED ORDER — ONDANSETRON HCL 4 MG/2ML IJ SOLN
INTRAMUSCULAR | Status: AC
  Filled 2017-05-13: qty 2

## 2017-05-13 MED ORDER — 0.9 % SODIUM CHLORIDE (POUR BTL) OPTIME
TOPICAL | Status: DC | PRN
  Administered 2017-05-13: 1000 mL

## 2017-05-13 MED ORDER — PHENYLEPHRINE 40 MCG/ML (10ML) SYRINGE FOR IV PUSH (FOR BLOOD PRESSURE SUPPORT)
PREFILLED_SYRINGE | INTRAVENOUS | Status: DC | PRN
  Administered 2017-05-13: 80 ug via INTRAVENOUS
  Administered 2017-05-13: 120 ug via INTRAVENOUS
  Administered 2017-05-13: 80 ug via INTRAVENOUS
  Administered 2017-05-13: 120 ug via INTRAVENOUS

## 2017-05-13 SURGICAL SUPPLY — 50 items
BIT DRILL 4.3MMS DISTAL GRDTED (BIT) ×1 IMPLANT
BNDG COHESIVE 6X5 TAN STRL LF (GAUZE/BANDAGES/DRESSINGS) IMPLANT
BRUSH SCRUB SURG 4.25 DISP (MISCELLANEOUS) ×6 IMPLANT
COVER PERINEAL POST (MISCELLANEOUS) ×3 IMPLANT
COVER SURGICAL LIGHT HANDLE (MISCELLANEOUS) ×6 IMPLANT
DRAPE C-ARMOR (DRAPES) ×3 IMPLANT
DRAPE HALF SHEET 40X57 (DRAPES) IMPLANT
DRAPE ORTHO SPLIT 77X108 STRL (DRAPES) ×4
DRAPE SURG ORHT 6 SPLT 77X108 (DRAPES) ×2 IMPLANT
DRAPE U-SHAPE 47X51 STRL (DRAPES) ×3 IMPLANT
DRILL 4.3MMS DISTAL GRADUATED (BIT) ×3
DRSG MEPILEX BORDER 4X4 (GAUZE/BANDAGES/DRESSINGS) ×3 IMPLANT
DRSG MEPILEX BORDER 4X8 (GAUZE/BANDAGES/DRESSINGS) ×3 IMPLANT
ELECT REM PT RETURN 9FT ADLT (ELECTROSURGICAL) ×3
ELECTRODE REM PT RTRN 9FT ADLT (ELECTROSURGICAL) ×1 IMPLANT
GLOVE BIO SURGEON STRL SZ7.5 (GLOVE) ×3 IMPLANT
GLOVE BIO SURGEON STRL SZ8 (GLOVE) ×3 IMPLANT
GLOVE BIOGEL PI IND STRL 7.5 (GLOVE) ×1 IMPLANT
GLOVE BIOGEL PI IND STRL 8 (GLOVE) ×1 IMPLANT
GLOVE BIOGEL PI INDICATOR 7.5 (GLOVE) ×2
GLOVE BIOGEL PI INDICATOR 8 (GLOVE) ×2
GOWN STRL REUS W/ TWL LRG LVL3 (GOWN DISPOSABLE) ×2 IMPLANT
GOWN STRL REUS W/ TWL XL LVL3 (GOWN DISPOSABLE) ×1 IMPLANT
GOWN STRL REUS W/TWL LRG LVL3 (GOWN DISPOSABLE) ×4
GOWN STRL REUS W/TWL XL LVL3 (GOWN DISPOSABLE) ×2
GUIDEPIN 3.2X17.5 THRD DISP (PIN) ×6 IMPLANT
GUIDEWIRE BALL NOSE 100CM (WIRE) ×3 IMPLANT
HIP FRAC NAIL LAG SCR 10.5X100 (Orthopedic Implant) ×2 IMPLANT
KIT BASIN OR (CUSTOM PROCEDURE TRAY) ×3 IMPLANT
KIT TURNOVER KIT B (KITS) ×3 IMPLANT
MANIFOLD NEPTUNE II (INSTRUMENTS) ×3 IMPLANT
NAIL HIP FRA AFFIX 130X9X400 L (Nail) ×3 IMPLANT
NS IRRIG 1000ML POUR BTL (IV SOLUTION) ×3 IMPLANT
PACK GENERAL/GYN (CUSTOM PROCEDURE TRAY) ×3 IMPLANT
PAD ARMBOARD 7.5X6 YLW CONV (MISCELLANEOUS) ×6 IMPLANT
SCREW BONE CORTICAL 5.0X40 (Screw) ×3 IMPLANT
SCREW BONE CORTICAL 5.0X44 (Screw) ×3 IMPLANT
SCREW CANN THRD AFF 10.5X100 (Orthopedic Implant) ×1 IMPLANT
STAPLER VISISTAT 35W (STAPLE) ×3 IMPLANT
STOCKINETTE IMPERVIOUS LG (DRAPES) IMPLANT
SUT ETHILON 3 0 PS 1 (SUTURE) ×3 IMPLANT
SUT VIC AB 0 CT1 27 (SUTURE) ×2
SUT VIC AB 0 CT1 27XBRD ANBCTR (SUTURE) ×1 IMPLANT
SUT VIC AB 1 CT1 27 (SUTURE) ×2
SUT VIC AB 1 CT1 27XBRD ANBCTR (SUTURE) ×1 IMPLANT
SUT VIC AB 2-0 CT1 27 (SUTURE) ×2
SUT VIC AB 2-0 CT1 TAPERPNT 27 (SUTURE) ×1 IMPLANT
TOWEL OR 17X24 6PK STRL BLUE (TOWEL DISPOSABLE) ×3 IMPLANT
TOWEL OR 17X26 10 PK STRL BLUE (TOWEL DISPOSABLE) ×6 IMPLANT
WATER STERILE IRR 1000ML POUR (IV SOLUTION) ×3 IMPLANT

## 2017-05-13 NOTE — Progress Notes (Signed)
OT Cancellation Note  Patient Details Name: Johnathan BeardsJames C Rinke MRN: [id number] DOB: Jul 20, 1934   Cancelled Treatment:    Reason Eval/Treat Not Completed: Patient at procedure or test/ unavailable. Pt in OR  Galen ManilaSpencer, Sharonica Kraszewski Jeanette 05/13/2017, 11:14 AM

## 2017-05-13 NOTE — Progress Notes (Signed)
PT Cancellation Note  Patient Details Name: Johnathan Mitchell MRN: [id number] DOB: [birth year]   Cancelled Treatment:    Reason Eval/Treat Not Completed: Patient not medically ready;Patient at procedure or test/unavailable.  Pt in OR.  Will f/u tomorrow.    Stephania FragminCaitlin E Briyana Badman 05/13/2017, 11:31 AM

## 2017-05-13 NOTE — Transfer of Care (Signed)
Immediate Anesthesia Transfer of Care Note  Patient: Johnathan Mitchell  Procedure(s) Performed: INTRAMEDULLARY (IM) NAIL FEMORAL (Left )  Patient Location: PACU  Anesthesia Type:General  Level of Consciousness: patient cooperative and responds to stimulation  Airway & Oxygen Therapy: Patient Spontanous Breathing and Patient connected to nasal cannula oxygen  Post-op Assessment: Report given to RN and Post -op Vital signs reviewed and stable  Post vital signs: Reviewed and stable  Last Vitals:  Vitals Value Taken Time  BP 109/63 05/13/2017  1:16 PM  Temp    Pulse 72 05/13/2017  1:20 PM  Resp 14 05/13/2017  1:20 PM  SpO2 93 % 05/13/2017  1:20 PM  Vitals shown include unvalidated device data.  Last Pain:  Vitals:   05/13/17 0853  TempSrc:   PainSc: 0-No pain         Complications: No apparent anesthesia complications

## 2017-05-13 NOTE — Telephone Encounter (Signed)
Sched appt 11/14/17; lab at 8:00 and NP at 8:45. Mailed appt letter to inform pt of appt.

## 2017-05-13 NOTE — Telephone Encounter (Signed)
-----   Message from Sharee PimpleMarilyn K McChesney, RN sent at 05/12/2017  4:32 PM EDT ----- Regarding: 6 months    ----- Message ----- From: Maeola Harmanain, Brandon Christopher, MD Sent: 05/12/2017   1:23 PM To: [facility] [address]  Threasa BeardsJames C Donate [id number] 06/23/1934  Inpatient level 1 Dx: right common iliac artery dissection  F/u in 6 months with np/pa with Aortoiliac duplex

## 2017-05-13 NOTE — Op Note (Signed)
05/13/2017  1:11 PM  PATIENT:  Johnathan Mitchell  83 y.o. male  PRE-OPERATIVE DIAGNOSIS:  LEFT FEMUR SUBTROCHANTERIC FRACTURE  POST-OPERATIVE DIAGNOSIS:  LEFT FEMUR SUBTROCHANTERIC FRACTURE  PROCEDURE:  Procedure(s): INTRAMEDULLARY (IM) NAIL FEMORAL (Left) WITH BIOMET AFFIXUS 9 X 400 MM statically locked  SURGEON:  Surgeon(s) and Role:    [name]* Merryn Thaker, MD - Primary  PHYSICIAN ASSISTANT: 1. Johnathan MoritaKeith Paul, Johnathan Mitchell; 2. PA Student  ANESTHESIA:   general  EBL:  250 mL   BLOOD ADMINISTERED:none  DRAINS: none   LOCAL MEDICATIONS USED:  NONE  SPECIMEN:  No Specimen  DISPOSITION OF SPECIMEN:  N/A  COUNTS:  YES  TOURNIQUET:  * No tourniquets in log *  PLAN OF CARE: Admit to inpatient   PATIENT DISPOSITION:  PACU - hemodynamically stable.   Delay start of Pharmacological VTE agent (>24hrs) due to surgical blood loss or risk of bleeding: no  BRIEF SUMMARY OF INDICATIONS FOR PROCEDURE:  Johnathan Mitchell is a 83 year old male who sustained a ground-level fall resulting in a severely displaced right subtrochanteric proximal femoral shaft fracture.  I discussed the risks and benefits of surgical fixation including potential for blood loss requiring transfusion, DVT, PE, malunion, nonunion, malalignment, particularly given the location as well as need for further surgery, heart attack, stroke, given her multiple comorbidities and past medical history.  After full discussion, consent was given to proceed.  BRIEF SUMMARY OF PROCEDURE:  The patient was given preoperative antibiotics and taken to the operating room where general anesthesia was induced.  He was positioned supine on radiolucent table. A reduction maneuver was performed of the right subtrochanteric fracture without any rotation and alignment, but could not affect posterior translation of the shaft which was not unexpected.  Betadine wash was performed and standard Betadine scrub and paint.  Standard prep and drape was  performed subsequently.  C-arm was brought in to identify the correct starting trajectory.  Again, the patient did receive Clinda preoperatively.  The skin incision was made proximal to greater trochanter.  Curved cannulated awl advanced to the center-center position on AP and lateral views.  The threaded guidewire was advanced. Because I could not sink the starting reamer to the appropriate depth and safely drill, decision was made to go ahead and proceed with open reduction of the fracture.  Extending the incision that was going to be used for placement of the femoral head lag screw, I was able to incise the IT band in line with the incision, reflected the vastus anteriorly and under the fracture site.  There was severe posterior translation of the distal fragment.  Using a variety of techniques including manual elevation, cerclage wiring, and clamp application, we were  able to obtain a reduction and we could then ream concentrically placing the ball-tipped guidewire and then checking its position in the distal femur as well as proximally across the fracture site, making sure that it was anatomically reduced, though was somewhat distracted during reaming.  We reamed up to 11 mm, placed an 9 x 400 mm rod to the appropriate depth.  I placed a lag screw in the center-center position of the femur on 2 views, leaving the proximal edge of the lag screw out of the cortex.  The reduction dialed in and then 2 lag screws were placed using perfect circle technique distally.  All screws and hardware position were checked on AP and lateral images for appropriate position.  Johnathan MoritaKeith Paul, Johnathan Mitchell, assisted me throughout and assistant was absolutely necessary  to produce reduction to allow for reaming.  Also assisted with placement of definitive fixation and combined closure to expedite our time in the OR in hopes of reducing complications. Standard layered closure was performed, 0 Vicryl for the vastus,  #1 Vicryl using figure-of-eight for the tensor or IT band and then 0 Vicryl, 2-0 Vicryl, and 2-0 nylon.  Sterile gently compressive dressing was applied.  The patient was awakened from anesthesia and transported to PACU in stable condition.  PROGNOSIS:  Johnathan Mitchell is at increased risk for multiple complications given her significant comorbidities.  That being said, he will be allowed to weightbear to tolerance and immediately mobilize.  Plan is to start immediate DVT prophylaxis, as well.  We will recheck labs to make sure that he maintains adequate volume given cerobrovascular disease.     Doralee Albino. Carola Frost, M.D.

## 2017-05-13 NOTE — Progress Notes (Signed)
SLP Cancellation Note  Patient Details Name: Johnathan Mitchell MRN: [id number] DOB: 06/23/1934   Cancelled treatment:       Reason Eval/Treat Not Completed: Other (comment) Pt is pending OR this morning - will f/u post-procedure as able to assess cognitive-linguistic status.   Johnathan Mitchell, Johnathan Mitchell 05/13/2017, 8:37 AM  Johnathan Mitchell, M.A. CCC-SLP [phone]

## 2017-05-13 NOTE — Care Management Note (Signed)
Case Management Note  Patient Details  Name: Johnathan Mitchell MRN: [id number] Date of Birth: 06/23/1934  Subjective/Objective:         Presented with slurred speech and bilateral arm weakness w hypotension, hx  of HTN, CVA, and asthma. In ED found to have left traumatic femur fracture.  Patient had had mechanical fall @ home PTA. From home with family. Owns walker, wheelchair   Bonnell PublicJaime Shrake (Daughter) Donna Christenloise Rose (Sister)     [phone]        PCP:      Action/Plan: Plan for OR tomorrow with Dr. Carola FrostHandy 4/16    Expected Discharge Date:                  Expected Discharge Plan:     In-House Referral:  Clinical Social Work  Discharge planning Services  CM Consult  Post Acute Care Choice:    Choice offered to:     DME Arranged:    DME Agency:     HH Arranged:    HH Agency:     Status of Service:  In process, will continue to follow  If discussed at Long Length of Stay Meetings, dates discussed:    Additional Comments:  Epifanio LeschesCole, Brittannie Tawney Hudson, RN 05/13/2017, 10:05 AM

## 2017-05-13 NOTE — Anesthesia Preprocedure Evaluation (Addendum)
Anesthesia Evaluation  Patient identified by MRN, date of birth, ID band Patient awake    Reviewed: Allergy & Precautions, H&P , Patient's Chart, lab work & pertinent test results, reviewed documented beta blocker date and time   Airway Mallampati: II  TM Distance: >3 FB Neck ROM: full    Dental no notable dental hx.    Pulmonary former smoker,    Pulmonary exam normal breath sounds clear to auscultation       Cardiovascular hypertension,  Rhythm:regular Rate:Normal     Neuro/Psych    GI/Hepatic   Endo/Other    Renal/GU      Musculoskeletal   Abdominal   Peds  Hematology   Anesthesia Other Findings Hypertension; Normal EF   Asthma   Seizures (HCC)    Stroke (HCC)   Dementia  Hb 10.3         Reproductive/Obstetrics                            Anesthesia Physical Anesthesia Plan  ASA: III  Anesthesia Plan: General   Post-op Pain Management:    Induction: Intravenous  PONV Risk Score and Plan: Treatment may vary due to age or medical condition and Ondansetron  Airway Management Planned: Oral ETT  Additional Equipment:   Intra-op Plan:   Post-operative Plan: Extubation in OR  Informed Consent: I have reviewed the patients History and Physical, chart, labs and discussed the procedure including the risks, benefits and alternatives for the proposed anesthesia with the patient or authorized representative who has indicated his/her understanding and acceptance.   Dental Advisory Given  Plan Discussed with: CRNA and Surgeon  Anesthesia Plan Comments: (  )        Anesthesia Quick Evaluation

## 2017-05-13 NOTE — Anesthesia Postprocedure Evaluation (Signed)
Anesthesia Post Note  Patient: Threasa BeardsJames C Podolak  Procedure(s) Performed: INTRAMEDULLARY (IM) NAIL FEMORAL (Left )     Patient location during evaluation: PACU Anesthesia Type: General Level of consciousness: awake and alert Pain management: pain level controlled Vital Signs Assessment: post-procedure vital signs reviewed and stable Respiratory status: spontaneous breathing, nonlabored ventilation, respiratory function stable and patient connected to nasal cannula oxygen Cardiovascular status: blood pressure returned to baseline and stable Postop Assessment: no apparent nausea or vomiting Anesthetic complications: no    Last Vitals:  Vitals:   05/13/17 1350 05/13/17 1408  BP: 111/80 117/82  Pulse: 100 96  Resp: 11   Temp:  (!) 36.4 C  SpO2: 100% 98%    Last Pain:  Vitals:   05/13/17 1408  TempSrc: Oral  PainSc:                  Jiles GarterJACKSON,Apolinar Bero EDWARD

## 2017-05-13 NOTE — Progress Notes (Addendum)
PROGRESS NOTE    Johnathan Mitchell  ZOX:[id number] DOB: 06-30-1934 DOA: 05/11/2017 PCP: System, Provider Not In   Brief Narrative: Patient is a 83 year old male with past medical history of asthma, dementia, hypertension, CVA, seizure disorder who apparently fell from wheelchair at his home .Patient immediately developed pain on his left thigh .  Found to have slurred speech , weakness on the right upper and lower extremities  so he was brought to the emergency department.  He was also noted to be hypotensive on presentation.  Imagings done in the ED was a suggestive  of left  displaced intertrochanteric femur fracture.  Initial presentation was suspicious of stroke.  Code stroke was called.  CT of the brain did not show any acute intracranial abnormalities.  Orthopedics, neurology and vascular surgery consulted.  Assessment & Plan:   Principal Problem:   Stroke [facility]) Active Problems:   Cerebral infarction (HCC)   Benign essential HTN   HTN (hypertension)   Dementia with behavioral disturbance   Femur fracture (HCC)   AKI (acute kidney injury) (HCC)  Suspect CVA: Currently patient on his baseline.  Neurology following.  CT imagings did not show any acute intracranial abnormalities.  Patient cannot go for MRI/MRA of the brain because of the femoral fracture. Neurology ordered CTA head and neck which has not been  done yet because the patient was on traction and he underwent surgery this morning.We will follow up reports. Presented with slurred speech and right sided weakness. Currently he has good strength on  upper extremities and his speech is on baseline at present. Patient has history of CVA in the past with residual right sided weakness.  He is wheelchair-bound.  Left Femoral fracture:Imagings showed displaced comminuted proximal LEFT femoral fracture extending into lesser trochanter.  Orthopedics following.  Underwent intramedullary nailing today PT/OT after surgery. DVT ppx with SCD  now.Also on aspirin.  Anemia: Hemoglobin dropped to 8.9 today.  Most likely associated with left femoral fracture.  Patient presented with significant swelling of the left thigh, most likely hematoma.  Will check CBC tomorrow  Right iliac dissection:CT imaging also showed short segment of dissection within a dilated RIGHT common iliac artery.  Also showed bilateral small hypogastric aneurysms .Vascular surgery consulted.  Vascular surgery cleared for orthopedic surgery.  Vascular surgery recommended to follow-up as an outpatient in 6 months with the aortoiliac duplex.  Acute kidney injury: Most likely secondary to hypotension on presentation.  Kidney function on baseline today.  IV fluids stopped.  HTN: Currently blood pressure stable.  Hypotensive on presentation.  Lisinopril held.  History of anxiety: Continue Lexapro and Depakote  DVT prophylaxis: SCD,Aspirin Code Status: Full Family Communication: None present at the bedside Disposition Plan:  To be determined,as per OT/PT   Consultants: Neurology, vascular surgery, orthopedics   Procedures:none  Antimicrobials:  None  Subjective: Patient seen and examined the bedside this morning.  Awaiting surgery today.  Looked  comfortable.  Complains of pain on the left thigh.  Objective: Vitals:   05/13/17 1345 05/13/17 1349 05/13/17 1350 05/13/17 1408  BP: 95/76 111/80 111/80 117/82  Pulse: (!) 102 (!) 102 100 96  Resp: 10 13 11    Temp:  (!) 97.5 F (36.4 C)  (!) 97.5 F (36.4 C)  TempSrc:    Oral  SpO2: 100% 100% 100% 98%  Weight:      Height:        Intake/Output Summary (Last 24 hours) at 05/13/2017 1428 Last data filed at 05/13/2017 1311  Gross per 24 hour  Intake 2800 ml  Output 1300 ml  Net 1500 ml   Filed Weights   05/11/17 1244  Weight: 68 kg (150 lb)    Examination:  General exam: Appears calm and comfortable ,Not in distress,average built HEENT:PERRL,Oral mucosa moist, Ear/Nose normal on gross  exam Respiratory system: Bilateral equal air entry, normal vesicular breath sounds, no wheezes or crackles  Cardiovascular system: S1 & S2 heard, RRR. No JVD, murmurs, rubs, gallops or clicks. Gastrointestinal system: Abdomen is nondistended, soft and nontender. No organomegaly or masses felt. Normal bowel sounds heard. Central nervous system: Alert and oriented.  Right-sided residual weakness  Extremities: Edema of the left eye, tenderness, traction applied on the left lower extremity skin: No rashes, lesions or ulcers,no icterus ,no pallor    Data Reviewed: I have personally reviewed following labs and imaging studies  CBC: Recent Labs  Lab 05/11/17 1135 05/11/17 1146 05/12/17 0429 05/13/17 0922  WBC 12.6*  --  9.1 9.3  NEUTROABS 10.9*  --   --   --   HGB 11.7* 12.6* 10.3* 8.9*  HCT 34.8* 37.0* 31.6* 28.2*  MCV 80.6  --  82.5 83.7  PLT 203  --  170 148*   Basic Metabolic Panel: Recent Labs  Lab 05/11/17 1135 05/11/17 1146 05/12/17 0429 05/13/17 0358  NA 136 137 140 139  K 5.1 5.2* 4.7 4.8  CL 103 104 105 107  CO2 21*  --  24 24  GLUCOSE 165* 174* 108* 109*  BUN 17 20 21* 19  CREATININE 1.48* 1.30* 1.36* 0.91  CALCIUM 9.4  --  8.8* 8.5*   GFR: Estimated Creatinine Clearance: 58.4 mL/min (by C-G formula based on SCr of 0.91 mg/dL). Liver Function Tests: Recent Labs  Lab 05/11/17 1135 05/12/17 0429  AST 45* 99*  ALT 25 32  ALKPHOS 59 48  BILITOT 0.6 0.8  PROT 7.5 6.6  ALBUMIN 3.4* 3.0*   No results for input(s): LIPASE, AMYLASE in the last 168 hours. No results for input(s): AMMONIA in the last 168 hours. Coagulation Profile: Recent Labs  Lab 05/11/17 1135  INR 1.06   Cardiac Enzymes: Recent Labs  Lab 05/11/17 2221 05/12/17 0429 05/12/17 1032  TROPONINI <0.03 <0.03 <0.03   BNP (last 3 results) No results for input(s): PROBNP in the last 8760 hours. HbA1C: Recent Labs    05/12/17 0429  HGBA1C 5.8*   CBG: Recent Labs  Lab 05/11/17 1136   GLUCAP 165*   Lipid Profile: Recent Labs    05/12/17 0429  CHOL 177  HDL 72  LDLCALC 91  TRIG 69  CHOLHDL 2.5   Thyroid Function Tests: No results for input(s): TSH, T4TOTAL, FREET4, T3FREE, THYROIDAB in the last 72 hours. Anemia Panel: No results for input(s): VITAMINB12, FOLATE, FERRITIN, TIBC, IRON, RETICCTPCT in the last 72 hours. Sepsis Labs: No results for input(s): PROCALCITON, LATICACIDVEN in the last 168 hours.  Recent Results (from the past 240 hour(s))  Surgical pcr screen     Status: None   Collection Time: 05/11/17  9:56 PM  Result Value Ref Range Status   MRSA, PCR NEGATIVE NEGATIVE Final   Staphylococcus aureus NEGATIVE NEGATIVE Final    Comment: (NOTE) The Xpert SA Assay (FDA approved for NASAL specimens in patients 53 years of age and older), is one component of a comprehensive surveillance program. It is not intended to diagnose infection nor to guide or monitor treatment. Performed at [facility], [address] [place], [place]  [address]          Radiology Studies: Dg C-arm 1-60 Min  Result Date: 05/13/2017 CLINICAL DATA:  Left femur fracture ORIF. EXAM: DG C-ARM 61-120 MIN; LEFT FEMUR 2 VIEWS COMPARISON:  Left femur x-rays dated May 11, 2017. FINDINGS: Multiple intraoperative x-rays demonstrate interval cephalomedullary rod fixation of the left proximal femur fracture. Alignment is near anatomic. No hardware complication. Unchanged avulsion fracture of the lesser trochanter. IMPRESSION: 1. Left proximal femur fracture ORIF.  Alignment is near anatomic. 2. Unchanged lesser trochanteric avulsion fracture. FLUOROSCOPY TIME:  99 seconds. C-arm fluoroscopic images were obtained intraoperatively and submitted for post operative interpretation. Electronically Signed   By: Obie Dredge M.D.   On: 05/13/2017 13:14   Dg C-arm 1-60 Min  Result Date: 05/13/2017 CLINICAL DATA:  Left femur fracture ORIF. EXAM: DG C-ARM 61-120 MIN; LEFT FEMUR 2 VIEWS  COMPARISON:  Left femur x-rays dated May 11, 2017. FINDINGS: Multiple intraoperative x-rays demonstrate interval cephalomedullary rod fixation of the left proximal femur fracture. Alignment is near anatomic. No hardware complication. Unchanged avulsion fracture of the lesser trochanter. IMPRESSION: 1. Left proximal femur fracture ORIF.  Alignment is near anatomic. 2. Unchanged lesser trochanteric avulsion fracture. FLUOROSCOPY TIME:  99 seconds. C-arm fluoroscopic images were obtained intraoperatively and submitted for post operative interpretation. Electronically Signed   By: Obie Dredge M.D.   On: 05/13/2017 13:14   Dg Femur Port 1v Left  Result Date: 05/11/2017 CLINICAL DATA:  Known fracture, following traction EXAM: LEFT FEMUR PORTABLE 1 VIEW COMPARISON:  Study obtained earlier in the day FINDINGS: Frontal view obtained. Comminuted fracture of the proximal left femoral diaphysis noted without appreciable change. There is avulsion of the lesser trochanter. There is medial displacement of the distal fracture fragment with respect to proximal fragment with 8.7 cm of overriding of fracture fragments. No distal fracture. Advanced arthropathy noted in the left knee joint region. Mild narrowing right hip joint. IMPRESSION: Persistent comminuted fracture proximal femur with a 0.7 cm of overriding of fracture fragments. Avulsion of the lesser trochanter. Alignment similar to pre traction images. Advanced arthropathy in the knee joint. No dislocation evident. Electronically Signed   By: Bretta Bang III M.D.   On: 05/11/2017 15:27   Dg Femur Min 2 Views Left  Result Date: 05/13/2017 CLINICAL DATA:  Left femur fracture ORIF. EXAM: DG C-ARM 61-120 MIN; LEFT FEMUR 2 VIEWS COMPARISON:  Left femur x-rays dated May 11, 2017. FINDINGS: Multiple intraoperative x-rays demonstrate interval cephalomedullary rod fixation of the left proximal femur fracture. Alignment is near anatomic. No hardware complication.  Unchanged avulsion fracture of the lesser trochanter. IMPRESSION: 1. Left proximal femur fracture ORIF.  Alignment is near anatomic. 2. Unchanged lesser trochanteric avulsion fracture. FLUOROSCOPY TIME:  99 seconds. C-arm fluoroscopic images were obtained intraoperatively and submitted for post operative interpretation. Electronically Signed   By: Obie Dredge M.D.   On: 05/13/2017 13:14        Scheduled Meds: .  stroke: mapping our early stages of recovery book   Does not apply Once  . aspirin  300 mg Rectal Daily   Or  . aspirin  325 mg Oral Daily  . atorvastatin  80 mg Oral q1800  . divalproex  500 mg Oral QHS  . docusate sodium  100 mg Oral BID  . escitalopram  10 mg Oral Daily   Continuous Infusions: . sodium chloride 10 mL/hr at 05/13/17 0821  . clindamycin (CLEOCIN) IV    . lactated ringers  LOS: 2 days    Time spent: 25 mins.      Burnadette PopAmrit Jamiyla Ishee, MD [facility] Pager [phone]  If 7PM-7AM, please contact night-coverage [email or web address] Password [email or web address] 05/13/2017, 2:28 PM

## 2017-05-14 ENCOUNTER — Encounter (HOSPITAL_COMMUNITY): Payer: Self-pay | Admitting: Orthopedic Surgery

## 2017-05-14 DIAGNOSIS — I10 Essential (primary) hypertension: Secondary | ICD-10-CM

## 2017-05-14 DIAGNOSIS — G459 Transient cerebral ischemic attack, unspecified: Secondary | ICD-10-CM

## 2017-05-14 DIAGNOSIS — D62 Acute posthemorrhagic anemia: Secondary | ICD-10-CM

## 2017-05-14 DIAGNOSIS — N179 Acute kidney failure, unspecified: Secondary | ICD-10-CM

## 2017-05-14 LAB — CBC WITH DIFFERENTIAL/PLATELET
BAND NEUTROPHILS: 0 %
BASOS ABS: 0 10*3/uL (ref 0.0–0.1)
Basophils Relative: 0 %
Blasts: 0 %
EOS ABS: 0 10*3/uL (ref 0.0–0.7)
Eosinophils Relative: 0 %
HCT: 22.9 % — ABNORMAL LOW (ref 39.0–52.0)
HEMOGLOBIN: 7.5 g/dL — AB (ref 13.0–17.0)
LYMPHS PCT: 9 %
Lymphs Abs: 0.7 10*3/uL (ref 0.7–4.0)
MCH: 27.4 pg (ref 26.0–34.0)
MCHC: 32.8 g/dL (ref 30.0–36.0)
MCV: 83.6 fL (ref 78.0–100.0)
MONOS PCT: 4 %
Metamyelocytes Relative: 0 %
Monocytes Absolute: 0.3 10*3/uL (ref 0.1–1.0)
Myelocytes: 0 %
NEUTROS ABS: 7.1 10*3/uL (ref 1.7–7.7)
NEUTROS PCT: 87 %
OTHER: 0 %
PROMYELOCYTES RELATIVE: 0 %
Platelets: 137 10*3/uL — ABNORMAL LOW (ref 150–400)
RBC: 2.74 MIL/uL — AB (ref 4.22–5.81)
RDW: 16.1 % — AB (ref 11.5–15.5)
WBC: 8.1 10*3/uL (ref 4.0–10.5)
nRBC: 0 /100 WBC

## 2017-05-14 LAB — PREPARE RBC (CROSSMATCH)

## 2017-05-14 MED ORDER — ACETAMINOPHEN 325 MG PO TABS
650.0000 mg | ORAL_TABLET | Freq: Once | ORAL | Status: AC
  Administered 2017-05-14: 650 mg via ORAL
  Filled 2017-05-14: qty 2

## 2017-05-14 MED ORDER — SODIUM CHLORIDE 0.9 % IV SOLN
Freq: Once | INTRAVENOUS | Status: AC
  Administered 2017-05-14: 11:00:00 via INTRAVENOUS

## 2017-05-14 MED ORDER — FUROSEMIDE 10 MG/ML IJ SOLN
20.0000 mg | Freq: Once | INTRAMUSCULAR | Status: AC
  Administered 2017-05-14: 20 mg via INTRAVENOUS
  Filled 2017-05-14: qty 2

## 2017-05-14 MED ORDER — OXYCODONE HCL 5 MG PO TABS
2.5000 mg | ORAL_TABLET | Freq: Four times a day (QID) | ORAL | Status: DC | PRN
  Administered 2017-05-15 – 2017-05-16 (×2): 5 mg via ORAL
  Filled 2017-05-14 (×3): qty 1

## 2017-05-14 MED ORDER — ACETAMINOPHEN 325 MG PO TABS
650.0000 mg | ORAL_TABLET | Freq: Four times a day (QID) | ORAL | Status: DC
  Administered 2017-05-14 – 2017-05-16 (×7): 650 mg via ORAL
  Filled 2017-05-14 (×7): qty 2

## 2017-05-14 NOTE — Progress Notes (Addendum)
Orthopedic Trauma Service Progress Note   [name] MRN: [id number] DOB/AGE: 03/30/1934 83 y.o.  Subjective:  Appears to be doing well this am  Pleasant  Sitting in bed watching TV  Some of his speech is nonsensical at times, has h/o CVA in past. ?baseline. This is first time I have interacted with the patient   ROS As above  Objective:   VITALS:   Vitals:   05/13/17 1638 05/13/17 2049 05/14/17 0534 05/14/17 0757  BP: 92/66 107/73 102/71 91/64  Pulse: (!) 110 (!) 104 98 89  Resp:  18 18 16   Temp: (!) 97.5 F (36.4 C) 97.9 F (36.6 C) 97.9 F (36.6 C) 97.7 F (36.5 C)  TempSrc: Oral Oral Oral   SpO2:  100%  100%  Weight:      Height:        Estimated body mass index is 24.21 kg/m as calculated from the following:   Height as of this encounter: 5\' 6"  (1.676 m).   Weight as of this encounter: 68 kg (150 lb).   Intake/Output      04/16 0701 - 04/17 0700 04/17 0701 - 04/18 0700   P.O. 120    I.V. (mL/kg) 1250 (18.4)    IV Piggyback 150    Total Intake(mL/kg) 1520 (22.4)    Urine (mL/kg/hr) 450 (0.3)    Blood 250    Total Output 700    Net +820           LABS  Results for orders placed or performed during the hospital encounter of 05/11/17 (from the past 24 hour(s))  CBC     Status: Abnormal   Collection Time: 05/13/17  9:22 AM  Result Value Ref Range   WBC 9.3 4.0 - 10.5 K/uL   RBC 3.37 (L) 4.22 - 5.81 MIL/uL   Hemoglobin 8.9 (L) 13.0 - 17.0 g/dL   HCT 21.3 (L) 08.6 - 57.8 %   MCV 83.7 78.0 - 100.0 fL   MCH 26.4 26.0 - 34.0 pg   MCHC 31.6 30.0 - 36.0 g/dL   RDW 46.9 (H) 62.9 - 52.8 %   Platelets 148 (L) 150 - 400 K/uL  Type and screen [facility]     Status: None   Collection Time: 05/13/17 11:00 AM  Result Value Ref Range   ABO/RH(D) A POS    Antibody Screen NEG    Sample Expiration      05/16/2017 Performed at [facility], [address], [place], [place] [address]   ABO/Rh      Status: None   Collection Time: 05/13/17 11:00 AM  Result Value Ref Range   ABO/RH(D)      A POS Performed at [facility], [address], [place], [place] [address]   CBC with Differential/Platelet     Status: Abnormal   Collection Time: 05/14/17  3:38 AM  Result Value Ref Range   WBC 8.1 4.0 - 10.5 K/uL   RBC 2.74 (L) 4.22 - 5.81 MIL/uL   Hemoglobin 7.5 (L) 13.0 - 17.0 g/dL   HCT 72.5 (L) 36.6 - 44.0 %   MCV 83.6 78.0 - 100.0 fL   MCH 27.4 26.0 - 34.0 pg   MCHC 32.8 30.0 - 36.0 g/dL   RDW 34.7 (H) 42.5 - 95.6 %   Platelets 137 (L) 150 - 400 K/uL   Neutrophils Relative % 87 %   Lymphocytes Relative 9 %   Monocytes Relative  4 %   Eosinophils Relative 0 %   Basophils Relative 0 %   Band Neutrophils 0 %   Metamyelocytes Relative 0 %   Myelocytes 0 %   Promyelocytes Relative 0 %   Blasts 0 %   nRBC 0 0 /100 WBC   Other 0 %   Neutro Abs 7.1 1.7 - 7.7 K/uL   Lymphs Abs 0.7 0.7 - 4.0 K/uL   Monocytes Absolute 0.3 0.1 - 1.0 K/uL   Eosinophils Absolute 0.0 0.0 - 0.7 K/uL   Basophils Absolute 0.0 0.0 - 0.1 K/uL   Smear Review MORPHOLOGY UNREMARKABLE      PHYSICAL EXAM:   Gen: pleasant, NAD Lungs: breathing unlabored Cardiac: regular  Ext:       Left Lower Extremity   Dressing stable, scant drainage  Ext warm   DPN, SPN, TN sensation intact  EHL, FHL, AT, PT, peroneals, gastroc motor intact  Ext warm   + DP pulse   Swelling well controlled     From a general ortho standpoint pt has numerous contractures and ankylosed joints. Lacks full ROM of his joints. [facility] as noted in several notes   Assessment/Plan: 1 Day Post-Op   Principal Problem:   Stroke [facility](HCC) Active Problems:   Cerebral infarction (HCC)   Benign essential HTN   HTN (hypertension)   Dementia with behavioral disturbance   Femur fracture (HCC)   AKI (acute kidney injury) (HCC)   Anti-infectives (From admission, onward)   Start     Dose/Rate Route Frequency  Ordered Stop   05/13/17 1700  clindamycin (CLEOCIN) IVPB 600 mg     600 mg 100 mL/hr over 30 Minutes Intravenous Every 6 hours 05/13/17 1408 05/14/17 0640   05/13/17 0930  clindamycin (CLEOCIN) IVPB 900 mg     900 mg 100 mL/hr over 30 Minutes Intravenous To ShortStay Surgical 05/13/17 0737 05/13/17 1100   05/13/17 0600  ceFAZolin (ANCEF) IVPB 2g/100 mL premix  Status:  Discontinued     2 g 200 mL/hr over 30 Minutes Intravenous On call to O.R. 05/12/17 2219 05/12/17 2244    .  POD/HD#: 1  83 y/o black male with complex medical history, Wheelchair dependent s/p fall with L subtrochanteric femur fracture  - fall, wheelchair dependent  -L subtrochanteric femur fracture s/p IMN  WBAT L leg  No ROM restrictions  PT/OT evals  Ice prn   Dressing changes as needed starting tomorrow     Metabolic bone work up    Suspect baseline osteoporosis   Recommend outpt dexa   - Pain management:  Scheduled tylenol  Oxy IR for severe pain   - ABL anemia/Hemodynamics  Expected drop in H/H given fracture and surgery  Would anticipated H/H to continue to drift down for another day or 2  Have ordred 2 units of PRBCs  Cbc in am   - Medical issues   Per Stroke team and IM  - DVT/PE prophylaxis:  No restrictions for anticoagulation from ortho standpoint  - ID:   periop abx  - Metabolic Bone Disease:  Lab work up   Suspect osteoporosis given fx with ground level fall  DEXA as outpt   - Activity:  WBAT L leg  - Dispo:  PT/OT evals  Likely need SNF   Mearl LatinKeith W. Chevette Fee, PA-C [facility] [phone] (P) [phone] (O) [phone] (C) 05/14/2017, 8:59 AM

## 2017-05-14 NOTE — Progress Notes (Addendum)
PROGRESS NOTE   Johnathan Mitchell  MVH:[id number]    DOB: 04-Nov-1934    DOA: 05/11/2017  PCP: System, Provider Not In   I have briefly reviewed patients previous medical records in [facility].  Brief Narrative:  83 year old male with PMH of prior CVA with residual right-sided weakness, wheelchair-bound, dementia, HTN, seizure, fell on day of admission and presented to the ED with acute transient aphasia, right-sided weakness worse than prior.  EMS was activated and noted SBP in 70s.  He was noted to have a very large hematoma in the left hip area.  He was admitted for acute left hip fracture and TIA.  Orthopedics and neurology evaluated.   Assessment & Plan:   Principal Problem:   Stroke [facility]) Active Problems:   Cerebral infarction (HCC)   Benign essential HTN   HTN (hypertension)   Dementia with behavioral disturbance   Femur fracture (HCC)   AKI (acute kidney injury) (HCC)   Left subtrochanteric femur fracture: Orthopedics was consulted and patient underwent intramedullary nail/ORIF.  As per orthopedics follow-up, weightbearing as tolerated on left leg, no AROM restrictions, PT OT evaluation and pain management.  RN reported increased bleeding at surgical site and advised to contact orthopedics.  As per orthopedics, suspect baseline osteoporosis and hence recommend outpatient DEXA scan.  Postop acute blood loss anemia: Hemoglobin dropped from 12.6 on 4/14-7.5.  Orthopedics transfusing 2 units PRBCs.  Follow CBCs in a.m.  TIA: Neurology was consulted and completed stroke workup.  I discussed with Dr. [name], Stroke MD and note appreciated >prior left hemispheric stroke presented with transient speech difficulties and right-sided weakness likely due to TIA versus small infarct in the setting of hypotension.  Recommends continuing aspirin and atorvastatin for stroke prevention and outpatient follow-up in stroke clinic.  No further stroke workup recommended.  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   Acute kidney injury: Likely due to hemodynamics.  Resolved.  Essential hypertension: Controlled.  Was hypotensive on arrival.  Lisinopril held.  History of anxiety: Continue Lexapro and Depakote.  Thrombocytopenia: Likely due to acute blood loss.  Follow CBC in a.m.    DVT prophylaxis: SCDs and aspirin. Code Status: Full Family Communication: None at bedside. Disposition: DC to SNF pending improvement, possibly in the next day or 2.   Consultants:  Orthopedics Neurology Vascular surgery  Procedures:  As above  Antimicrobials:  None   Subjective: Seen this morning.  Appeared slightly confused.  No pain reported.  Was wondering how he was going to get out of bed.  As per RN, no acute issues reported.  ROS: As above.  Objective:  Vitals:   05/14/17 1121 05/14/17 1420 05/14/17 1454 05/14/17 1521  BP: 117/76 114/73 107/72 128/75  Pulse: 89 90 91 86  Resp: 16 14 14 16   Temp: 97.7 F (36.5 C) 99 F (37.2 C) 98.1 F (36.7 C) 98.3 F (36.8 C)  TempSrc: Tympanic Oral Oral Oral  SpO2: 100% 100% 100% 100%  Weight:      Height:        Examination:  General exam: Pleasant elderly male sitting up comfortably in bed. Respiratory system: Clear to auscultation. Respiratory effort normal. Cardiovascular system: S1 & S2 heard, RRR. No JVD, murmurs, rubs, gallops or clicks. No pedal edema.  Telemetry personally reviewed: Sinus rhythm. Gastrointestinal system: Abdomen is nondistended, soft and nontender. No organomegaly or  masses felt. Normal bowel sounds heard. Central nervous system: Alert and oriented x2. No focal neurological deficits. Extremities: Left lower extremity power assessment limited secondary to postop pain.  Grade 5 x 5 power in left upper extremity.  Grade 4 x 5 power in right limbs.  Left  hip postop dressing appeared clean and dried to my exam but had some expected post op left thigh edema. Skin: No rashes, lesions or ulcers Psychiatry: Judgement and insight impaired. Mood & affect appropriate.     Data Reviewed: I have personally reviewed following labs and imaging studies  CBC: Recent Labs  Lab 05/11/17 1135 05/11/17 1146 05/12/17 0429 05/13/17 0922 05/14/17 0338  WBC 12.6*  --  9.1 9.3 8.1  NEUTROABS 10.9*  --   --   --  7.1  HGB 11.7* 12.6* 10.3* 8.9* 7.5*  HCT 34.8* 37.0* 31.6* 28.2* 22.9*  MCV 80.6  --  82.5 83.7 83.6  PLT 203  --  170 148* 137*   Basic Metabolic Panel: Recent Labs  Lab 05/11/17 1135 05/11/17 1146 05/12/17 0429 05/13/17 0358  NA 136 137 140 139  K 5.1 5.2* 4.7 4.8  CL 103 104 105 107  CO2 21*  --  24 24  GLUCOSE 165* 174* 108* 109*  BUN 17 20 21* 19  CREATININE 1.48* 1.30* 1.36* 0.91  CALCIUM 9.4  --  8.8* 8.5*   Liver Function Tests: Recent Labs  Lab 05/11/17 1135 05/12/17 0429  AST 45* 99*  ALT 25 32  ALKPHOS 59 48  BILITOT 0.6 0.8  PROT 7.5 6.6  ALBUMIN 3.4* 3.0*   Coagulation Profile: Recent Labs  Lab 05/11/17 1135  INR 1.06   Cardiac Enzymes: Recent Labs  Lab 05/11/17 2221 05/12/17 0429 05/12/17 1032  TROPONINI <0.03 <0.03 <0.03   HbA1C: Recent Labs    05/12/17 0429  HGBA1C 5.8*   CBG: Recent Labs  Lab 05/11/17 1136  GLUCAP 165*    Recent Results (from the past 240 hour(s))  Surgical pcr screen     Status: None   Collection Time: 05/11/17  9:56 PM  Result Value Ref Range Status   MRSA, PCR NEGATIVE NEGATIVE Final   Staphylococcus aureus NEGATIVE NEGATIVE Final    Comment: (NOTE) The Xpert SA Assay (FDA approved for NASAL specimens in patients 81 years of age and older), is one component of a comprehensive surveillance program. It is not intended to diagnose infection nor to guide or monitor treatment. Performed at [facility], [address] [place], [place] [address]           Radiology Studies: Ct Angio Head W Or Wo Contrast  Result Date: 05/13/2017 CLINICAL DATA:  Stroke follow-up EXAM: CT ANGIOGRAPHY HEAD AND NECK TECHNIQUE: Multidetector CT imaging of the head and neck was performed using the standard protocol during bolus administration of intravenous contrast. Multiplanar CT image reconstructions and MIPs were obtained to evaluate the vascular anatomy. Carotid stenosis measurements (when applicable) are obtained utilizing NASCET criteria, using the distal internal carotid diameter as the denominator. CONTRAST:  50mL ISOVUE-370 IOPAMIDOL (ISOVUE-370) INJECTION 76% COMPARISON:  Head CT 05/11/2017 FINDINGS: CT HEAD FINDINGS BRAIN: No mass lesion, intraparenchymal hemorrhage or extra-axial collection. No evidence of acute cortical infarct. Extensive chronic ischemic white matter disease and encephalomalacia throughout much of the left hemisphere. Wallerian degeneration at the left cerebral peduncle. VASCULAR: No hyperdense vessel or unexpected vascular calcification. SKULL: Normal visualized skull base, calvarium and extracranial soft tissues. SINUSES/ORBITS: No sinus fluid levels or  advanced mucosal thickening. No mastoid effusion. Normal orbits. CTA NECK FINDINGS AORTIC ARCH: There is no calcific atherosclerosis of the aortic arch. There is no aneurysm, dissection or hemodynamically significant stenosis of the visualized ascending aorta and aortic arch. Conventional 3 vessel aortic branching pattern. The visualized proximal subclavian arteries are widely patent. RIGHT CAROTID SYSTEM: --Common carotid artery: Widely patent origin without common carotid artery dissection or aneurysm. --Internal carotid artery: No dissection, occlusion or aneurysm. No hemodynamically significant stenosis. --External carotid artery: No acute abnormality. LEFT CAROTID SYSTEM: --Common carotid artery: Widely patent origin without common carotid artery dissection or aneurysm. --Internal  carotid artery:No dissection, occlusion or aneurysm. No hemodynamically significant stenosis. --External carotid artery: No acute abnormality. VERTEBRAL ARTERIES: Left dominant configuration. Both origins are normal. Right vertebral artery terminates in PICA. There is predominantly noncalcified plaque in the V4 segment of the left vertebral artery. SKELETON: There is no bony spinal canal stenosis. No lytic or blastic lesion. OTHER NECK: Fluid within the distal cervical and proximal thoracic esophagus. UPPER CHEST: No pneumothorax or pleural effusion. No nodules or masses. CTA HEAD FINDINGS ANTERIOR CIRCULATION: --Intracranial internal carotid arteries: Normal. --Anterior cerebral arteries: Normal. Both A1 segments are present. Patent anterior communicating artery. --Middle cerebral arteries: Normal. --Posterior communicating arteries: Absent bilaterally. POSTERIOR CIRCULATION: --Basilar artery: Normal. --Posterior cerebral arteries: Normal. --Superior cerebellar arteries: Normal. --Inferior cerebellar arteries: Normal variant pattern with the right vertebral artery terminating in PICA. VENOUS SINUSES: As permitted by contrast timing, patent. ANATOMIC VARIANTS: None DELAYED PHASE: No parenchymal contrast enhancement. Review of the MIP images confirms the above findings. IMPRESSION: 1. No emergent large vessel occlusion or flow-limiting stenosis. 2. Focal noncalcified atherosclerotic plaque in the V4 segment of the left vertebral artery causing slightly less than 50% stenosis. 3. Multiple old infarcts and extensive cerebral volume loss. Electronically Signed   By: Deatra Robinson M.D.   On: 05/13/2017 20:03   Ct Angio Neck W Or Wo Contrast  Result Date: 05/13/2017 CLINICAL DATA:  Stroke follow-up EXAM: CT ANGIOGRAPHY HEAD AND NECK TECHNIQUE: Multidetector CT imaging of the head and neck was performed using the standard protocol during bolus administration of intravenous contrast. Multiplanar CT image reconstructions  and MIPs were obtained to evaluate the vascular anatomy. Carotid stenosis measurements (when applicable) are obtained utilizing NASCET criteria, using the distal internal carotid diameter as the denominator. CONTRAST:  50mL ISOVUE-370 IOPAMIDOL (ISOVUE-370) INJECTION 76% COMPARISON:  Head CT 05/11/2017 FINDINGS: CT HEAD FINDINGS BRAIN: No mass lesion, intraparenchymal hemorrhage or extra-axial collection. No evidence of acute cortical infarct. Extensive chronic ischemic white matter disease and encephalomalacia throughout much of the left hemisphere. Wallerian degeneration at the left cerebral peduncle. VASCULAR: No hyperdense vessel or unexpected vascular calcification. SKULL: Normal visualized skull base, calvarium and extracranial soft tissues. SINUSES/ORBITS: No sinus fluid levels or advanced mucosal thickening. No mastoid effusion. Normal orbits. CTA NECK FINDINGS AORTIC ARCH: There is no calcific atherosclerosis of the aortic arch. There is no aneurysm, dissection or hemodynamically significant stenosis of the visualized ascending aorta and aortic arch. Conventional 3 vessel aortic branching pattern. The visualized proximal subclavian arteries are widely patent. RIGHT CAROTID SYSTEM: --Common carotid artery: Widely patent origin without common carotid artery dissection or aneurysm. --Internal carotid artery: No dissection, occlusion or aneurysm. No hemodynamically significant stenosis. --External carotid artery: No acute abnormality. LEFT CAROTID SYSTEM: --Common carotid artery: Widely patent origin without common carotid artery dissection or aneurysm. --Internal carotid artery:No dissection, occlusion or aneurysm. No hemodynamically significant stenosis. --External carotid artery: No acute abnormality. VERTEBRAL ARTERIES:  Left dominant configuration. Both origins are normal. Right vertebral artery terminates in PICA. There is predominantly noncalcified plaque in the V4 segment of the left vertebral artery.  SKELETON: There is no bony spinal canal stenosis. No lytic or blastic lesion. OTHER NECK: Fluid within the distal cervical and proximal thoracic esophagus. UPPER CHEST: No pneumothorax or pleural effusion. No nodules or masses. CTA HEAD FINDINGS ANTERIOR CIRCULATION: --Intracranial internal carotid arteries: Normal. --Anterior cerebral arteries: Normal. Both A1 segments are present. Patent anterior communicating artery. --Middle cerebral arteries: Normal. --Posterior communicating arteries: Absent bilaterally. POSTERIOR CIRCULATION: --Basilar artery: Normal. --Posterior cerebral arteries: Normal. --Superior cerebellar arteries: Normal. --Inferior cerebellar arteries: Normal variant pattern with the right vertebral artery terminating in PICA. VENOUS SINUSES: As permitted by contrast timing, patent. ANATOMIC VARIANTS: None DELAYED PHASE: No parenchymal contrast enhancement. Review of the MIP images confirms the above findings. IMPRESSION: 1. No emergent large vessel occlusion or flow-limiting stenosis. 2. Focal noncalcified atherosclerotic plaque in the V4 segment of the left vertebral artery causing slightly less than 50% stenosis. 3. Multiple old infarcts and extensive cerebral volume loss. Electronically Signed   By: Deatra Robinson M.D.   On: 05/13/2017 20:03   Dg C-arm 1-60 Min  Result Date: 05/13/2017 CLINICAL DATA:  Left femur fracture ORIF. EXAM: DG C-ARM 61-120 MIN; LEFT FEMUR 2 VIEWS COMPARISON:  Left femur x-rays dated May 11, 2017. FINDINGS: Multiple intraoperative x-rays demonstrate interval cephalomedullary rod fixation of the left proximal femur fracture. Alignment is near anatomic. No hardware complication. Unchanged avulsion fracture of the lesser trochanter. IMPRESSION: 1. Left proximal femur fracture ORIF.  Alignment is near anatomic. 2. Unchanged lesser trochanteric avulsion fracture. FLUOROSCOPY TIME:  99 seconds. C-arm fluoroscopic images were obtained intraoperatively and submitted for post  operative interpretation. Electronically Signed   By: Obie Dredge M.D.   On: 05/13/2017 13:14   Dg C-arm 1-60 Min  Result Date: 05/13/2017 CLINICAL DATA:  Left femur fracture ORIF. EXAM: DG C-ARM 61-120 MIN; LEFT FEMUR 2 VIEWS COMPARISON:  Left femur x-rays dated May 11, 2017. FINDINGS: Multiple intraoperative x-rays demonstrate interval cephalomedullary rod fixation of the left proximal femur fracture. Alignment is near anatomic. No hardware complication. Unchanged avulsion fracture of the lesser trochanter. IMPRESSION: 1. Left proximal femur fracture ORIF.  Alignment is near anatomic. 2. Unchanged lesser trochanteric avulsion fracture. FLUOROSCOPY TIME:  99 seconds. C-arm fluoroscopic images were obtained intraoperatively and submitted for post operative interpretation. Electronically Signed   By: Obie Dredge M.D.   On: 05/13/2017 13:14   Dg Femur Min 2 Views Left  Result Date: 05/13/2017 CLINICAL DATA:  83 y/o  M; postop left femur. EXAM: LEFT FEMUR 2 VIEWS COMPARISON:  05/11/2017 left proximal femur radiograph. FINDINGS: Left proximal femoral diaphysis acute fracture post nail fixation with near anatomic alignment. Medially displaced fracture of lesser trochanter. No hip dislocation. No new fracture identified. 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 Mild edema and air within the surrounding soft tissues. IMPRESSION: 1. Left proximal femoral diaphysis acute fracture post nail fixation with near anatomic alignment. Expected postsurgical changes in soft tissues. 2. Mild left hip and severe left knee osteoarthrosis. Electronically Signed   By: Mitzi Hansen M.D.   On: 05/13/2017 20:56   Dg Femur Min 2 Views Left  Result Date: 05/13/2017 CLINICAL DATA:  Left femur fracture ORIF. EXAM: DG C-ARM 61-120 MIN; LEFT FEMUR 2 VIEWS COMPARISON:  Left femur x-rays dated May 11, 2017. FINDINGS: Multiple intraoperative x-rays  demonstrate  interval cephalomedullary rod fixation of the left proximal femur fracture. Alignment is near anatomic. No hardware complication. Unchanged avulsion fracture of the lesser trochanter. IMPRESSION: 1. Left proximal femur fracture ORIF.  Alignment is near anatomic. 2. Unchanged lesser trochanteric avulsion fracture. FLUOROSCOPY TIME:  99 seconds. C-arm fluoroscopic images were obtained intraoperatively and submitted for post operative interpretation. Electronically Signed   By: Obie Dredge M.D.   On: 05/13/2017 13:14        Scheduled Meds: .  stroke: mapping our early stages of recovery book   Does not apply Once  . acetaminophen  650 mg Oral Q6H  . aspirin  300 mg Rectal Daily   Or  . aspirin  325 mg Oral Daily  . atorvastatin  80 mg Oral q1800  . divalproex  500 mg Oral QHS  . docusate sodium  100 mg Oral BID  . escitalopram  10 mg Oral Daily   Continuous Infusions: . sodium chloride 10 mL/hr at 05/13/17 1518  . lactated ringers       LOS: 3 days     Marcellus Scott, MD, FACP, [facility] Pager [phone]  If 7PM-7AM, please contact night-coverage [email or web address] Password [facility] 05/14/2017, 5:39 PM

## 2017-05-14 NOTE — Evaluation (Signed)
Physical Therapy Evaluation Patient Details Name: Johnathan Mitchell MRN: [id number] DOB: [birth year] Today's Date: 05/14/2017   History of Present Illness  83 y.o. male with prior CVA with right-sided residual weakness is wheelchair-bound, dementia, hypertension, seizure who presents to the ED with acute transient aphasia, flaccidity on the right side apparently s/p fall from wheelchair at his home, from which pt felt immediate pain in his L thigh. Pt s/p L IM nail proceedure. WBAT. CT of the brain did not show any acute intracranial abnormalities.  Clinical Impression  Pt has some cognitive decline and family members present at bedside during evaluation. From interview and previous notes in appears pt lives with daughter and mobilizes short distances with RW and W/c for longer distances. Pt requires some assistance for bathing and dressing. Pt currently limited by increased L LE pain with weightbearing secondary to IM.  Pt also limited by decreased ROM and strength in his R LE from previous CVA. Pt is currently minA for bed mobility and MaxA for sit>stand without achieving fully upright. PT currently recommends SNF level rehab at d/c, however has potential to progress during his hospitalization. PT will follow acutely.     Follow Up Recommendations SNF    Equipment Recommendations  None recommended by PT    Recommendations for Other Services       Precautions / Restrictions Precautions Precautions: Fall Precaution Comments: admitted from fall from w/c Restrictions Weight Bearing Restrictions: Yes LLE Weight Bearing: Weight bearing as tolerated      Mobility  Bed Mobility Overal bed mobility: Needs Assistance Bed Mobility: Rolling;Supine to Sit;Sit to Supine Rolling: Supervision   Supine to sit: Min assist Sit to supine: Min guard   General bed mobility comments: minA for management of L LE off of bed, pt able to bring trunk to upright with use of bedrail, min guard for bringing L LE  into bed, pt able to scoot up in bed without assist  Transfers Overall transfer level: Needs assistance Equipment used: Rolling walker (2 wheeled) Transfers: Sit to/from Stand Sit to Stand: Max assist         General transfer comment: maxA for powerup, R foot blocked however unable to get R knee flexed and underneath him to powerup, full standing was not achieved despite 2x attempts  Ambulation/Gait             General Gait Details: not able to achieve     Balance Overall balance assessment: Needs assistance Sitting-balance support: No upper extremity supported;Feet supported Sitting balance-Leahy Scale: Fair Sitting balance - Comments: pt required minA for balance with initial sitting EoB however progressed to fair balance within a minute    Standing balance support: Bilateral upper extremity supported Standing balance-Leahy Scale: Zero                               Pertinent Vitals/Pain Pain Assessment: Faces Faces Pain Scale: Hurts whole lot Pain Location: L LE with movement Pain Descriptors / Indicators: Grimacing;Guarding;Operative site guarding Pain Intervention(s): Limited activity within patient's tolerance;Monitored during session;Repositioned    Home Living Family/patient expects to be discharged to:: Private residence Living Arrangements: Children;Other relatives Available Help at Discharge: Family;Available 24 hours/day Type of Home: House Home Access: Ramped entrance     Home Layout: One level Home Equipment: Walker - 2 wheels;Wheelchair - manual      Prior Function Level of Independence: Needs assistance   Gait / Transfers Assistance Needed:  reports using RW to transfer to w/c PTA  ADL's / Homemaking Assistance Needed: reports daughter assists with bathing and dressing        Hand Dominance   Dominant Hand: Right    Extremity/Trunk Assessment   Upper Extremity Assessment Upper Extremity Assessment: Defer to OT evaluation     Lower Extremity Assessment Lower Extremity Assessment: RLE deficits/detail;LLE deficits/detail RLE Deficits / Details: limited hip and knee flexing ROM, strength grossly 3/5 RLE Sensation: WNL RLE Coordination: decreased fine motor LLE Deficits / Details: L hip IM nail, limiting strength and ROM LLE: Unable to fully assess due to pain LLE Sensation: WNL LLE Coordination: decreased fine motor       Communication   Communication: No difficulties  Cognition Arousal/Alertness: Awake/alert Behavior During Therapy: WFL for tasks assessed/performed Overall Cognitive Status: No family/caregiver present to determine baseline cognitive functioning                                 General Comments: pt oriented to self, vague knowledge of hospital and surgery on his leg, difficult determining if home history is in the recent past or distant pass      General Comments General comments (skin integrity, edema, etc.): Pt with reports of being "swimmy headed" with initial sitting EoB, which quickly subsided        Assessment/Plan    PT Assessment Patient needs continued PT services  PT Problem List Decreased strength;Decreased range of motion;Decreased mobility;Decreased knowledge of use of DME;Decreased safety awareness;Pain;Decreased balance;Decreased cognition       PT Treatment Interventions DME instruction;Gait training;Functional mobility training;Therapeutic activities;Therapeutic exercise;Balance training;Cognitive remediation;Patient/family education    PT Goals (Current goals can be found in the Care Plan section)  Acute Rehab PT Goals Patient Stated Goal: get home PT Goal Formulation: With patient Time For Goal Achievement: 05/28/17 Potential to Achieve Goals: Fair    Frequency Min 3X/week    AM-PAC PT "6 Clicks" Daily Activity  Outcome Measure Difficulty turning over in bed (including adjusting bedclothes, sheets and blankets)?: A Little Difficulty moving  from lying on back to sitting on the side of the bed? : Unable Difficulty sitting down on and standing up from a chair with arms (e.g., wheelchair, bedside commode, etc,.)?: Unable Help needed moving to and from a bed to chair (including a wheelchair)?: A Lot Help needed walking in hospital room?: Total Help needed climbing 3-5 steps with a railing? : Total 6 Click Score: 9    End of Session Equipment Utilized During Treatment: Gait belt Activity Tolerance: Patient tolerated treatment well Patient left: in chair;with call bell/phone within reach;with chair alarm set Nurse Communication: Mobility status;Need for lift equipment PT Visit Diagnosis: Unsteadiness on feet (R26.81);Other abnormalities of gait and mobility (R26.89);Muscle weakness (generalized) (M62.81);Difficulty in walking, not elsewhere classified (R26.2);History of falling (Z91.81)    Time: 1610-9604 PT Time Calculation (min) (ACUTE ONLY): 24 min   Charges:   PT Evaluation $PT Eval Moderate Complexity: 1 Mod PT Treatments $Therapeutic Activity: 8-22 mins   PT G Codes:        Johnathan Mitchell B. Beverely Risen PT, DPT Acute Rehabilitation  [phone] Pager [phone]    Johnathan Mitchell Fleet 05/14/2017, 4:45 PM

## 2017-05-14 NOTE — Progress Notes (Signed)
Stroke Team Progress Note  Johnathan Mitchell is an 83 y.o. male with prior CVA with right-sided residual weakness is wheelchair-bound, dementia, hypertension, seizure who presents to the ED with acute transient aphasia, flaccidity on the right side and came to the ER as a code stroke. Per family 7 am this morning, patient was suspected and had a witnessed fall.  Family was able to get him up and put him in his wheelchair without incident.  Per family patient was fine and conversing with them normally.  Around 10:50 AM, while watching TV with his family they noticed he suddenly became aphasic and flaccid on his right upper and lower extremities.  They called immediately and upon arrival patient's SBP was in the 70s and with a bit of movement it was 90/60.  While in the ambulance en route to the hospital the patient complained of severe left leg pain and then it was noticed that he had very large hematoma in the left hip area, wincing in pain when left leg manipulated.  On arrival to the hospital patient's blood pressure was elevated at 183/169, he was more verbal without evidence of dysarthria. He continued to have right-sided weakness but no drift noted on bilateral lower extremities; equal strength with bilateral great toe raise.  STAT CT of the head revealed a chronic old left stroke and no acute changes.  He denies recent fevers, chills, nausea or vomiting; no headache or dizziness, patient has baseline dementia which may decrease the accuracy of this review of systems.  LSN:05/11/17 - 1050am tPA Given: No - Improving neurological deficits and high risk of severe hemorrhagic complications given probable left hip hematoma in the setting of labile BP with intermittent hypotension Premorbid modified Rankin scale (mRS):4   SUBJECTIVE he had intramedullary nailing for his fractured femur yesterday and has done well. He has no complaints today.CT angiogram of the brain and neck do not show any significant  large vessel intracranial or extracranial stenosis. OBJECTIVE Most recent Vital Signs: Temp: 97.7 F (36.5 C) (04/17 1121) Temp Source: Tympanic (04/17 1121) BP: 117/76 (04/17 1121) Pulse Rate: 89 (04/17 1121) Respiratory Rate: 16 O2 Saturdation: 100%  CBG (last 3)  No results for input(s): GLUCAP in the last 72 hours.  Diet: Fall precautions Fall precautions Diet Heart Room service appropriate? Yes; Fluid consistency: Thin   liquids  Activity: bed rest  VTE Prophylaxis: SCds  Studies: Results for orders placed or performed during the hospital encounter of 05/11/17 (from the past 24 hour(s))  CBC with Differential/Platelet     Status: Abnormal   Collection Time: 05/14/17  3:38 AM  Result Value Ref Range   WBC 8.1 4.0 - 10.5 K/uL   RBC 2.74 (L) 4.22 - 5.81 MIL/uL   Hemoglobin 7.5 (L) 13.0 - 17.0 g/dL   HCT 16.1 (L) 09.6 - 04.5 %   MCV 83.6 78.0 - 100.0 fL   MCH 27.4 26.0 - 34.0 pg   MCHC 32.8 30.0 - 36.0 g/dL   RDW 40.9 (H) 81.1 - 91.4 %   Platelets 137 (L) 150 - 400 K/uL   Neutrophils Relative % 87 %   Lymphocytes Relative 9 %   Monocytes Relative 4 %   Eosinophils Relative 0 %   Basophils Relative 0 %   Band Neutrophils 0 %   Metamyelocytes Relative 0 %   Myelocytes 0 %   Promyelocytes Relative 0 %   Blasts 0 %   nRBC 0 0 /100 WBC   Other 0 %  Neutro Abs 7.1 1.7 - 7.7 K/uL   Lymphs Abs 0.7 0.7 - 4.0 K/uL   Monocytes Absolute 0.3 0.1 - 1.0 K/uL   Eosinophils Absolute 0.0 0.0 - 0.7 K/uL   Basophils Absolute 0.0 0.0 - 0.1 K/uL   Smear Review MORPHOLOGY UNREMARKABLE   Prepare RBC     Status: None   Collection Time: 05/14/17  9:02 AM  Result Value Ref Range   Order Confirmation      ORDER PROCESSED BY BLOOD BANK Performed at [facility], [address] [place], [place] [address]      Ct Angio Head W Or Wo Contrast  Result Date: 05/13/2017 CLINICAL DATA:  Stroke follow-up EXAM: CT ANGIOGRAPHY HEAD AND NECK TECHNIQUE: Multidetector CT imaging of the  head and neck was performed using the standard protocol during bolus administration of intravenous contrast. Multiplanar CT image reconstructions and MIPs were obtained to evaluate the vascular anatomy. Carotid stenosis measurements (when applicable) are obtained utilizing NASCET criteria, using the distal internal carotid diameter as the denominator. CONTRAST:  50mL ISOVUE-370 IOPAMIDOL (ISOVUE-370) INJECTION 76% COMPARISON:  Head CT 05/11/2017 FINDINGS: CT HEAD FINDINGS BRAIN: No mass lesion, intraparenchymal hemorrhage or extra-axial collection. No evidence of acute cortical infarct. Extensive chronic ischemic white matter disease and encephalomalacia throughout much of the left hemisphere. Wallerian degeneration at the left cerebral peduncle. VASCULAR: No hyperdense vessel or unexpected vascular calcification. SKULL: Normal visualized skull base, calvarium and extracranial soft tissues. SINUSES/ORBITS: No sinus fluid levels or advanced mucosal thickening. No mastoid effusion. Normal orbits. CTA NECK FINDINGS AORTIC ARCH: There is no calcific atherosclerosis of the aortic arch. There is no aneurysm, dissection or hemodynamically significant stenosis of the visualized ascending aorta and aortic arch. Conventional 3 vessel aortic branching pattern. The visualized proximal subclavian arteries are widely patent. RIGHT CAROTID SYSTEM: --Common carotid artery: Widely patent origin without common carotid artery dissection or aneurysm. --Internal carotid artery: No dissection, occlusion or aneurysm. No hemodynamically significant stenosis. --External carotid artery: No acute abnormality. LEFT CAROTID SYSTEM: --Common carotid artery: Widely patent origin without common carotid artery dissection or aneurysm. --Internal carotid artery:No dissection, occlusion or aneurysm. No hemodynamically significant stenosis. --External carotid artery: No acute abnormality. VERTEBRAL ARTERIES: Left dominant configuration. Both origins  are normal. Right vertebral artery terminates in PICA. There is predominantly noncalcified plaque in the V4 segment of the left vertebral artery. SKELETON: There is no bony spinal canal stenosis. No lytic or blastic lesion. OTHER NECK: Fluid within the distal cervical and proximal thoracic esophagus. UPPER CHEST: No pneumothorax or pleural effusion. No nodules or masses. CTA HEAD FINDINGS ANTERIOR CIRCULATION: --Intracranial internal carotid arteries: Normal. --Anterior cerebral arteries: Normal. Both A1 segments are present. Patent anterior communicating artery. --Middle cerebral arteries: Normal. --Posterior communicating arteries: Absent bilaterally. POSTERIOR CIRCULATION: --Basilar artery: Normal. --Posterior cerebral arteries: Normal. --Superior cerebellar arteries: Normal. --Inferior cerebellar arteries: Normal variant pattern with the right vertebral artery terminating in PICA. VENOUS SINUSES: As permitted by contrast timing, patent. ANATOMIC VARIANTS: None DELAYED PHASE: No parenchymal contrast enhancement. Review of the MIP images confirms the above findings. IMPRESSION: 1. No emergent large vessel occlusion or flow-limiting stenosis. 2. Focal noncalcified atherosclerotic plaque in the V4 segment of the left vertebral artery causing slightly less than 50% stenosis. 3. Multiple old infarcts and extensive cerebral volume loss. Electronically Signed   By: Deatra RobinsonKevin  Herman M.D.   On: 05/13/2017 20:03   Ct Angio Neck W Or Wo Contrast  Result Date: 05/13/2017 CLINICAL DATA:  Stroke follow-up EXAM: CT  ANGIOGRAPHY HEAD AND NECK TECHNIQUE: Multidetector CT imaging of the head and neck was performed using the standard protocol during bolus administration of intravenous contrast. Multiplanar CT image reconstructions and MIPs were obtained to evaluate the vascular anatomy. Carotid stenosis measurements (when applicable) are obtained utilizing NASCET criteria, using the distal internal carotid diameter as the  denominator. CONTRAST:  50mL ISOVUE-370 IOPAMIDOL (ISOVUE-370) INJECTION 76% COMPARISON:  Head CT 05/11/2017 FINDINGS: CT HEAD FINDINGS BRAIN: No mass lesion, intraparenchymal hemorrhage or extra-axial collection. No evidence of acute cortical infarct. Extensive chronic ischemic white matter disease and encephalomalacia throughout much of the left hemisphere. Wallerian degeneration at the left cerebral peduncle. VASCULAR: No hyperdense vessel or unexpected vascular calcification. SKULL: Normal visualized skull base, calvarium and extracranial soft tissues. SINUSES/ORBITS: No sinus fluid levels or advanced mucosal thickening. No mastoid effusion. Normal orbits. CTA NECK FINDINGS AORTIC ARCH: There is no calcific atherosclerosis of the aortic arch. There is no aneurysm, dissection or hemodynamically significant stenosis of the visualized ascending aorta and aortic arch. Conventional 3 vessel aortic branching pattern. The visualized proximal subclavian arteries are widely patent. RIGHT CAROTID SYSTEM: --Common carotid artery: Widely patent origin without common carotid artery dissection or aneurysm. --Internal carotid artery: No dissection, occlusion or aneurysm. No hemodynamically significant stenosis. --External carotid artery: No acute abnormality. LEFT CAROTID SYSTEM: --Common carotid artery: Widely patent origin without common carotid artery dissection or aneurysm. --Internal carotid artery:No dissection, occlusion or aneurysm. No hemodynamically significant stenosis. --External carotid artery: No acute abnormality. VERTEBRAL ARTERIES: Left dominant configuration. Both origins are normal. Right vertebral artery terminates in PICA. There is predominantly noncalcified plaque in the V4 segment of the left vertebral artery. SKELETON: There is no bony spinal canal stenosis. No lytic or blastic lesion. OTHER NECK: Fluid within the distal cervical and proximal thoracic esophagus. UPPER CHEST: No pneumothorax or pleural  effusion. No nodules or masses. CTA HEAD FINDINGS ANTERIOR CIRCULATION: --Intracranial internal carotid arteries: Normal. --Anterior cerebral arteries: Normal. Both A1 segments are present. Patent anterior communicating artery. --Middle cerebral arteries: Normal. --Posterior communicating arteries: Absent bilaterally. POSTERIOR CIRCULATION: --Basilar artery: Normal. --Posterior cerebral arteries: Normal. --Superior cerebellar arteries: Normal. --Inferior cerebellar arteries: Normal variant pattern with the right vertebral artery terminating in PICA. VENOUS SINUSES: As permitted by contrast timing, patent. ANATOMIC VARIANTS: None DELAYED PHASE: No parenchymal contrast enhancement. Review of the MIP images confirms the above findings. IMPRESSION: 1. No emergent large vessel occlusion or flow-limiting stenosis. 2. Focal noncalcified atherosclerotic plaque in the V4 segment of the left vertebral artery causing slightly less than 50% stenosis. 3. Multiple old infarcts and extensive cerebral volume loss. Electronically Signed   By: Deatra Robinson M.D.   On: 05/13/2017 20:03   Dg C-arm 1-60 Min  Result Date: 05/13/2017 CLINICAL DATA:  Left femur fracture ORIF. EXAM: DG C-ARM 61-120 MIN; LEFT FEMUR 2 VIEWS COMPARISON:  Left femur x-rays dated May 11, 2017. FINDINGS: Multiple intraoperative x-rays demonstrate interval cephalomedullary rod fixation of the left proximal femur fracture. Alignment is near anatomic. No hardware complication. Unchanged avulsion fracture of the lesser trochanter. IMPRESSION: 1. Left proximal femur fracture ORIF.  Alignment is near anatomic. 2. Unchanged lesser trochanteric avulsion fracture. FLUOROSCOPY TIME:  99 seconds. C-arm fluoroscopic images were obtained intraoperatively and submitted for post operative interpretation. Electronically Signed   By: Obie Dredge M.D.   On: 05/13/2017 13:14   Dg C-arm 1-60 Min  Result Date: 05/13/2017 CLINICAL DATA:  Left femur fracture ORIF. EXAM:  DG C-ARM 61-120 MIN; LEFT FEMUR 2 VIEWS COMPARISON:  Left femur x-rays dated May 11, 2017. FINDINGS: Multiple intraoperative x-rays demonstrate interval cephalomedullary rod fixation of the left proximal femur fracture. Alignment is near anatomic. No hardware complication. Unchanged avulsion fracture of the lesser trochanter. IMPRESSION: 1. Left proximal femur fracture ORIF.  Alignment is near anatomic. 2. Unchanged lesser trochanteric avulsion fracture. FLUOROSCOPY TIME:  99 seconds. C-arm fluoroscopic images were obtained intraoperatively and submitted for post operative interpretation. Electronically Signed   By: Obie Dredge M.D.   On: 05/13/2017 13:14   Dg Femur Min 2 Views Left  Result Date: 05/13/2017 CLINICAL DATA:  83 y/o  M; postop left femur. EXAM: LEFT FEMUR 2 VIEWS COMPARISON:  05/11/2017 left proximal femur radiograph. FINDINGS: Left proximal femoral diaphysis acute fracture post nail fixation with near anatomic alignment. Medially displaced fracture of lesser trochanter. No hip dislocation. No new fracture identified. 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 Mild edema and air within the surrounding soft tissues. IMPRESSION: 1. Left proximal femoral diaphysis acute fracture post nail fixation with near anatomic alignment. Expected postsurgical changes in soft tissues. 2. Mild left hip and severe left knee osteoarthrosis. Electronically Signed   By: Mitzi Hansen M.D.   On: 05/13/2017 20:56   Dg Femur Min 2 Views Left  Result Date: 05/13/2017 CLINICAL DATA:  Left femur fracture ORIF. EXAM: DG C-ARM 61-120 MIN; LEFT FEMUR 2 VIEWS COMPARISON:  Left femur x-rays dated May 11, 2017. FINDINGS: Multiple intraoperative x-rays demonstrate interval cephalomedullary rod fixation of the left proximal femur fracture. Alignment is near anatomic. No hardware complication. Unchanged avulsion fracture of the lesser trochanter.  IMPRESSION: 1. Left proximal femur fracture ORIF.  Alignment is near anatomic. 2. Unchanged lesser trochanteric avulsion fracture. FLUOROSCOPY TIME:  99 seconds. C-arm fluoroscopic images were obtained intraoperatively and submitted for post operative interpretation. Electronically Signed   By: Obie Dredge M.D.   On: 05/13/2017 13:14  LDL 140 and HbA1c 5.8 CT angio brain and neck : 1. No emergent large vessel occlusion or flow-limiting stenosis. 2. Focal noncalcified atherosclerotic plaque in the V4 segment of the left vertebral artery causing slightly less than 50% stenosis. 3. Multiple old infarcts and extensive cerebral volume loss.  Physical Exam:     Frail cachectic elderly African-American male not in distress. He has left lower extremity in traction. . Afebrile. Head is nontraumatic. Neck is supple without bruit.    Cardiac exam no murmur or gallop. Lungs are clear to auscultation. Distal pulses are well felt. Neurological Exam :  Awake alert oriented to time only. Diminished attention, registration and recall. Follows only simple midline and one-step commands. Extraocular moments are full range without nystagmus. Blinks to threat bilaterally. Fundi not visualized. Pupils equal reactive. Face is symmetric. Tongue midline. Speech is slightly slurred and hesitant but no aphasia. Motor system exam symmetric strength in upper extremity is with mild 4/5 weakness proximally and distally. Left lower extremity movement testing is limited due to recent hip fracture and traction. Strength appears normal in the right lower extremity. Sensation appears preserved bilaterally.plantars are both downgoing. Gait not tested. ASSESSMENT Johnathan Mitchell is a 83 y.o. male with prior left hemispheric stroke presents with transient speech difficulties and right-sided weakness likely due to (TIA versus small infarct in setting of hyotension. Recent left femur fracture following fall. Vascular risk factors of  hypertension, hyperlipidemia and previous stroke Hospital day # 3  TREATMENT/PLAN   Continue aspirin for stroke prevention and aggressive risk factor modification.  Follow-up as an outpatient in stroke clinic. No further stroke workup is needed at the present time. Stroke team will sign off. Kindly call for questions.   Delia Heady, MD [facility] Pager: [phone] 05/14/2017 1:21 PM

## 2017-05-15 ENCOUNTER — Encounter (HOSPITAL_COMMUNITY): Payer: Self-pay | Admitting: Orthopedic Surgery

## 2017-05-15 DIAGNOSIS — D696 Thrombocytopenia, unspecified: Secondary | ICD-10-CM

## 2017-05-15 LAB — BPAM RBC
BLOOD PRODUCT EXPIRATION DATE: 201905172359
BLOOD PRODUCT EXPIRATION DATE: 201905172359
ISSUE DATE / TIME: 201904171500
ISSUE DATE / TIME: 201904171050
Unit Type and Rh: 6200
Unit Type and Rh: 6200

## 2017-05-15 LAB — COMPREHENSIVE METABOLIC PANEL
ALK PHOS: 37 U/L — AB (ref 38–126)
ALT: 30 U/L (ref 17–63)
ANION GAP: 7 (ref 5–15)
AST: 64 U/L — ABNORMAL HIGH (ref 15–41)
Albumin: 2.5 g/dL — ABNORMAL LOW (ref 3.5–5.0)
BILIRUBIN TOTAL: 0.9 mg/dL (ref 0.3–1.2)
BUN: 14 mg/dL (ref 6–20)
CALCIUM: 8.2 mg/dL — AB (ref 8.9–10.3)
CO2: 28 mmol/L (ref 22–32)
Chloride: 101 mmol/L (ref 101–111)
Creatinine, Ser: 0.83 mg/dL (ref 0.61–1.24)
GFR calc non Af Amer: >60 mL/min (ref >60)
Glucose, Bld: 98 mg/dL (ref 65–99)
POTASSIUM: 4.1 mmol/L (ref 3.5–5.1)
Sodium: 136 mmol/L (ref 135–145)
TOTAL PROTEIN: 5.8 g/dL — AB (ref 6.5–8.1)

## 2017-05-15 LAB — CBC
HEMATOCRIT: 28 % — AB (ref 39.0–52.0)
Hemoglobin: 9.3 g/dL — ABNORMAL LOW (ref 13.0–17.0)
MCH: 27.5 pg (ref 26.0–34.0)
MCHC: 33.2 g/dL (ref 30.0–36.0)
MCV: 82.8 fL (ref 78.0–100.0)
Platelets: 118 10*3/uL — ABNORMAL LOW (ref 150–400)
RBC: 3.38 MIL/uL — ABNORMAL LOW (ref 4.22–5.81)
RDW: 15.2 % (ref 11.5–15.5)
WBC: 10.3 10*3/uL (ref 4.0–10.5)

## 2017-05-15 LAB — TSH: TSH: 1.845 u[IU]/mL (ref 0.350–4.500)

## 2017-05-15 LAB — TYPE AND SCREEN
ABO/RH(D): A POS
Antibody Screen: NEGATIVE
UNIT DIVISION: 0
Unit division: 0

## 2017-05-15 LAB — MAGNESIUM: MAGNESIUM: 1.9 mg/dL (ref 1.7–2.4)

## 2017-05-15 LAB — PREALBUMIN: Prealbumin: 12.2 mg/dL — ABNORMAL LOW (ref 18–38)

## 2017-05-15 LAB — PHOSPHORUS: PHOSPHORUS: 2.2 mg/dL — AB (ref 2.5–4.6)

## 2017-05-15 MED ORDER — POTASSIUM PHOSPHATES 15 MMOLE/5ML IV SOLN
10.0000 mmol | Freq: Once | INTRAVENOUS | Status: AC
  Administered 2017-05-15: 10 mmol via INTRAVENOUS
  Filled 2017-05-15: qty 3.33

## 2017-05-15 NOTE — Progress Notes (Signed)
PROGRESS NOTE   Johnathan Mitchell  XBJ:[id number]    DOB: [birth year]    DOA: 05/11/2017  PCP: System, Provider Not In   I have briefly reviewed patients previous medical records in [facility].  Brief Narrative:  83 year old male with PMH of prior CVA with residual right-sided weakness, wheelchair-bound, dementia, HTN, seizure, fell on day of admission and presented to the ED with acute transient aphasia, right-sided weakness worse than prior.  EMS was activated and noted SBP in 70s.  He was noted to have a very large hematoma in the left hip area.  He was admitted for acute left hip fracture and TIA.  Orthopedics and neurology evaluated.   Assessment & Plan:   Principal Problem:   Stroke [facility](HCC) Active Problems:   Cerebral infarction (HCC)   Benign essential HTN   HTN (hypertension)   Dementia with behavioral disturbance   Femur fracture (HCC)   AKI (acute kidney injury) (HCC)   Left subtrochanteric femur fracture: Orthopedics was consulted and patient underwent intramedullary nail/ORIF.  As per orthopedics follow-up, weightbearing as tolerated on left leg, no ROM restrictions, PT OT evaluation and pain management.  As per orthopedics, suspect baseline osteoporosis and hence recommend outpatient DEXA scan.  Stable.  Postop acute blood loss anemia: Hemoglobin dropped from 12.6 on 4/14-7.5.  Orthopedics transfused 2 units PRBCs.  Hemoglobin has improved to 9.3.  Follow CBC in a.m.  TIA: Neurology was consulted and completed stroke workup.  I discussed with Dr. Pearlean BrownieSethi, Stroke MD on 4/17 and note appreciated >prior left hemispheric stroke presented with transient speech difficulties and right-sided weakness likely due to TIA versus small infarct in the setting of hypotension.  Recommends continuing aspirin and atorvastatin for stroke prevention and outpatient follow-up in stroke clinic.  No further stroke workup recommended.  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  Acute kidney injury: Likely due to hemodynamics.  Resolved.  Essential hypertension: Controlled.  Was hypotensive on arrival.  Lisinopril held.  History of anxiety: Continue Lexapro and Depakote.  Stable.  Thrombocytopenia: Likely due to acute blood loss.  Platelets continue to gradually drop as below.  Follow CBC in a.m.    DVT prophylaxis: SCDs and aspirin. Code Status: Full Family Communication: None at bedside. Disposition: DC to SNF pending improvement, possibly 4/19 pending bed availability   Consultants:  Orthopedics Neurology Vascular surgery  Procedures:  As above  Antimicrobials:  None   Subjective: Patient denies pain.  Reports appropriate left hip pain, mild and intermittent.  As per RN, no acute issues noted.  ROS: As above.  Objective:  Vitals:   05/14/17 1742 05/14/17 2146 05/15/17 0619 05/15/17 1501  BP: 117/74 (!) 113/92 116/85 113/77  Pulse: 84 78 70 92  Resp: 16 12 12 18   Temp: 98 F (36.7 C) 98.2 F (36.8 C) 97.8 F (36.6 C) 98.3 F (36.8 C)  TempSrc: Oral   Oral  SpO2: 100% 97% 99% 97%  Weight:      Height:        Examination:  General exam: Pleasant elderly male sitting up comfortably in bed and eating breakfast this morning. Respiratory system: Clear to auscultation. Respiratory effort normal.  Stable. Cardiovascular system: S1 and S2 heard, RRR.  No JVD, murmurs or pedal edema.  Telemetry personally reviewed: Sinus rhythm. Gastrointestinal system: Abdomen is nondistended, soft and nontender. No organomegaly or masses felt. Normal  bowel sounds heard.  Stable. Central nervous system: Alert and oriented x2. No focal neurological deficits.  Stable without change. Extremities: Left lower extremity power assessment limited secondary to postop pain.  Grade 5 x 5 power in left upper extremity.  Grade 4 x 5  power in right limbs.  Left hip postop dressing clean and dry. Skin: No rashes, lesions or ulcers Psychiatry: Judgement and insight impaired. Mood & affect appropriate.     Data Reviewed: I have personally reviewed following labs and imaging studies  CBC: Recent Labs  Lab 05/11/17 1135 05/11/17 1146 05/12/17 0429 05/13/17 0922 05/14/17 0338 05/15/17 0421  WBC 12.6*  --  9.1 9.3 8.1 10.3  NEUTROABS 10.9*  --   --   --  7.1  --   HGB 11.7* 12.6* 10.3* 8.9* 7.5* 9.3*  HCT 34.8* 37.0* 31.6* 28.2* 22.9* 28.0*  MCV 80.6  --  82.5 83.7 83.6 82.8  PLT 203  --  170 148* 137* 118*   Basic Metabolic Panel: Recent Labs  Lab 05/11/17 1135 05/11/17 1146 05/12/17 0429 05/13/17 0358 05/15/17 0421  NA 136 137 140 139 136  K 5.1 5.2* 4.7 4.8 4.1  CL 103 104 105 107 101  CO2 21*  --  24 24 28   GLUCOSE 165* 174* 108* 109* 98  BUN 17 20 21* 19 14  CREATININE 1.48* 1.30* 1.36* 0.91 0.83  CALCIUM 9.4  --  8.8* 8.5* 8.2*  MG  --   --   --   --  1.9  PHOS  --   --   --   --  2.2*   Liver Function Tests: Recent Labs  Lab 05/11/17 1135 05/12/17 0429 05/15/17 0421  AST 45* 99* 64*  ALT 25 32 30  ALKPHOS 59 48 37*  BILITOT 0.6 0.8 0.9  PROT 7.5 6.6 5.8*  ALBUMIN 3.4* 3.0* 2.5*   Coagulation Profile: Recent Labs  Lab 05/11/17 1135  INR 1.06   Cardiac Enzymes: Recent Labs  Lab 05/11/17 2221 05/12/17 0429 05/12/17 1032  TROPONINI <0.03 <0.03 <0.03   HbA1C: No results for input(s): HGBA1C in the last 72 hours. CBG: Recent Labs  Lab 05/11/17 1136  GLUCAP 165*    Recent Results (from the past 240 hour(s))  Surgical pcr screen     Status: None   Collection Time: 05/11/17  9:56 PM  Result Value Ref Range Status   MRSA, PCR NEGATIVE NEGATIVE Final   Staphylococcus aureus NEGATIVE NEGATIVE Final    Comment: (NOTE) The Xpert SA Assay (FDA approved for NASAL specimens in patients 58 years of age and older), is one component of a comprehensive surveillance program. It is  not intended to diagnose infection nor to guide or monitor treatment. Performed at [facility], [address] [place], [place] [address]          Radiology Studies: Ct Angio Head W Or Wo Contrast  Result Date: 05/13/2017 CLINICAL DATA:  Stroke follow-up EXAM: CT ANGIOGRAPHY HEAD AND NECK TECHNIQUE: Multidetector CT imaging of the head and neck was performed using the standard protocol during bolus administration of intravenous contrast. Multiplanar CT image reconstructions and MIPs were obtained to evaluate the vascular anatomy. Carotid stenosis measurements (when applicable) are obtained utilizing NASCET criteria, using the distal internal carotid diameter as the denominator. CONTRAST:  50mL ISOVUE-370 IOPAMIDOL (ISOVUE-370) INJECTION 76% COMPARISON:  Head CT 05/11/2017 FINDINGS: CT HEAD FINDINGS BRAIN: No mass lesion, intraparenchymal hemorrhage or extra-axial collection. No evidence of acute cortical  infarct. Extensive chronic ischemic white matter disease and encephalomalacia throughout much of the left hemisphere. Wallerian degeneration at the left cerebral peduncle. VASCULAR: No hyperdense vessel or unexpected vascular calcification. SKULL: Normal visualized skull base, calvarium and extracranial soft tissues. SINUSES/ORBITS: No sinus fluid levels or advanced mucosal thickening. No mastoid effusion. Normal orbits. CTA NECK FINDINGS AORTIC ARCH: There is no calcific atherosclerosis of the aortic arch. There is no aneurysm, dissection or hemodynamically significant stenosis of the visualized ascending aorta and aortic arch. Conventional 3 vessel aortic branching pattern. The visualized proximal subclavian arteries are widely patent. RIGHT CAROTID SYSTEM: --Common carotid artery: Widely patent origin without common carotid artery dissection or aneurysm. --Internal carotid artery: No dissection, occlusion or aneurysm. No hemodynamically significant stenosis. --External carotid artery: No  acute abnormality. LEFT CAROTID SYSTEM: --Common carotid artery: Widely patent origin without common carotid artery dissection or aneurysm. --Internal carotid artery:No dissection, occlusion or aneurysm. No hemodynamically significant stenosis. --External carotid artery: No acute abnormality. VERTEBRAL ARTERIES: Left dominant configuration. Both origins are normal. Right vertebral artery terminates in PICA. There is predominantly noncalcified plaque in the V4 segment of the left vertebral artery. SKELETON: There is no bony spinal canal stenosis. No lytic or blastic lesion. OTHER NECK: Fluid within the distal cervical and proximal thoracic esophagus. UPPER CHEST: No pneumothorax or pleural effusion. No nodules or masses. CTA HEAD FINDINGS ANTERIOR CIRCULATION: --Intracranial internal carotid arteries: Normal. --Anterior cerebral arteries: Normal. Both A1 segments are present. Patent anterior communicating artery. --Middle cerebral arteries: Normal. --Posterior communicating arteries: Absent bilaterally. POSTERIOR CIRCULATION: --Basilar artery: Normal. --Posterior cerebral arteries: Normal. --Superior cerebellar arteries: Normal. --Inferior cerebellar arteries: Normal variant pattern with the right vertebral artery terminating in PICA. VENOUS SINUSES: As permitted by contrast timing, patent. ANATOMIC VARIANTS: None DELAYED PHASE: No parenchymal contrast enhancement. Review of the MIP images confirms the above findings. IMPRESSION: 1. No emergent large vessel occlusion or flow-limiting stenosis. 2. Focal noncalcified atherosclerotic plaque in the V4 segment of the left vertebral artery causing slightly less than 50% stenosis. 3. Multiple old infarcts and extensive cerebral volume loss. Electronically Signed   By: Deatra Robinson M.D.   On: 05/13/2017 20:03   Ct Angio Neck W Or Wo Contrast  Result Date: 05/13/2017 CLINICAL DATA:  Stroke follow-up EXAM: CT ANGIOGRAPHY HEAD AND NECK TECHNIQUE: Multidetector CT imaging  of the head and neck was performed using the standard protocol during bolus administration of intravenous contrast. Multiplanar CT image reconstructions and MIPs were obtained to evaluate the vascular anatomy. Carotid stenosis measurements (when applicable) are obtained utilizing NASCET criteria, using the distal internal carotid diameter as the denominator. CONTRAST:  50mL ISOVUE-370 IOPAMIDOL (ISOVUE-370) INJECTION 76% COMPARISON:  Head CT 05/11/2017 FINDINGS: CT HEAD FINDINGS BRAIN: No mass lesion, intraparenchymal hemorrhage or extra-axial collection. No evidence of acute cortical infarct. Extensive chronic ischemic white matter disease and encephalomalacia throughout much of the left hemisphere. Wallerian degeneration at the left cerebral peduncle. VASCULAR: No hyperdense vessel or unexpected vascular calcification. SKULL: Normal visualized skull base, calvarium and extracranial soft tissues. SINUSES/ORBITS: No sinus fluid levels or advanced mucosal thickening. No mastoid effusion. Normal orbits. CTA NECK FINDINGS AORTIC ARCH: There is no calcific atherosclerosis of the aortic arch. There is no aneurysm, dissection or hemodynamically significant stenosis of the visualized ascending aorta and aortic arch. Conventional 3 vessel aortic branching pattern. The visualized proximal subclavian arteries are widely patent. RIGHT CAROTID SYSTEM: --Common carotid artery: Widely patent origin without common carotid artery dissection or aneurysm. --Internal carotid artery: No dissection, occlusion or  aneurysm. No hemodynamically significant stenosis. --External carotid artery: No acute abnormality. LEFT CAROTID SYSTEM: --Common carotid artery: Widely patent origin without common carotid artery dissection or aneurysm. --Internal carotid artery:No dissection, occlusion or aneurysm. No hemodynamically significant stenosis. --External carotid artery: No acute abnormality. VERTEBRAL ARTERIES: Left dominant configuration. Both  origins are normal. Right vertebral artery terminates in PICA. There is predominantly noncalcified plaque in the V4 segment of the left vertebral artery. SKELETON: There is no bony spinal canal stenosis. No lytic or blastic lesion. OTHER NECK: Fluid within the distal cervical and proximal thoracic esophagus. UPPER CHEST: No pneumothorax or pleural effusion. No nodules or masses. CTA HEAD FINDINGS ANTERIOR CIRCULATION: --Intracranial internal carotid arteries: Normal. --Anterior cerebral arteries: Normal. Both A1 segments are present. Patent anterior communicating artery. --Middle cerebral arteries: Normal. --Posterior communicating arteries: Absent bilaterally. POSTERIOR CIRCULATION: --Basilar artery: Normal. --Posterior cerebral arteries: Normal. --Superior cerebellar arteries: Normal. --Inferior cerebellar arteries: Normal variant pattern with the right vertebral artery terminating in PICA. VENOUS SINUSES: As permitted by contrast timing, patent. ANATOMIC VARIANTS: None DELAYED PHASE: No parenchymal contrast enhancement. Review of the MIP images confirms the above findings. IMPRESSION: 1. No emergent large vessel occlusion or flow-limiting stenosis. 2. Focal noncalcified atherosclerotic plaque in the V4 segment of the left vertebral artery causing slightly less than 50% stenosis. 3. Multiple old infarcts and extensive cerebral volume loss. Electronically Signed   By: Deatra Robinson M.D.   On: 05/13/2017 20:03   Dg Femur Min 2 Views Left  Result Date: 05/13/2017 CLINICAL DATA:  83 y/o  M; postop left femur. EXAM: LEFT FEMUR 2 VIEWS COMPARISON:  05/11/2017 left proximal femur radiograph. FINDINGS: Left proximal femoral diaphysis acute fracture post nail fixation with near anatomic alignment. Medially displaced fracture of lesser trochanter. No hip dislocation. No new fracture identified. 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 Mild edema and air  within the surrounding soft tissues. IMPRESSION: 1. Left proximal femoral diaphysis acute fracture post nail fixation with near anatomic alignment. Expected postsurgical changes in soft tissues. 2. Mild left hip and severe left knee osteoarthrosis. Electronically Signed   By: Mitzi Hansen M.D.   On: 05/13/2017 20:56        Scheduled Meds: .  stroke: mapping our early stages of recovery book   Does not apply Once  . acetaminophen  650 mg Oral Q6H  . aspirin  300 mg Rectal Daily   Or  . aspirin  325 mg Oral Daily  . atorvastatin  80 mg Oral q1800  . divalproex  500 mg Oral QHS  . docusate sodium  100 mg Oral BID  . escitalopram  10 mg Oral Daily   Continuous Infusions:    LOS: 4 days     Marcellus Scott, MD, FACP, [facility]. [facility] Pager [phone]  If 7PM-7AM, please contact night-coverage [email or web address] Password [facility] 05/15/2017, 3:31 PM

## 2017-05-15 NOTE — Evaluation (Signed)
Speech Language Pathology Evaluation Patient Details Name: Johnathan Mitchell MRN: [id number] DOB: [birth year] Today's Date: 05/15/2017 Time: 0735-0800 SLP Time Calculation (min) (ACUTE ONLY): 25 min  Problem List:  Patient Active Problem List   Diagnosis Date Noted  . AKI (acute kidney injury) (HCC) 05/12/2017  . Stroke (HCC) 05/11/2017  . Femur fracture (HCC) 05/11/2017  . Chest pain 02/09/2016  . HTN (hypertension) 02/09/2016  . Dementia with behavioral disturbance 02/09/2016  . Chest pain at rest 02/09/2016  . Cerebral infarction (HCC) 02/01/2014  . Benign essential HTN 02/01/2014   Past Medical History:  Past Medical History:  Diagnosis Date  . Asthma   . Dementia   . Hypertension   . Seizures (HCC)   . Stroke [facility])    Past Surgical History:  Past Surgical History:  Procedure Laterality Date  . BACK SURGERY    . FEMUR IM NAIL Left 05/13/2017   Procedure: INTRAMEDULLARY (IM) NAIL FEMORAL;  Surgeon: [name], MD;  Location: [facility] OR;  Service: Orthopedics;  Laterality: Left;  . KNEE SURGERY     HPI:  83 yo male adm to [facility] with right sided weakness, expressive language deficits and fall - Pt is s/p surgery Left subtrochanteric femur fracture: Orthopedics was consulted and patient underwent intramedullary nail/ORIF.  Pt has h/o dementia.  CT head showed Wallerian degeneration at the left cerebral peduncle, extensive white matter disease and encephalomalacia t/o left hemisphere.  Speech evaluation ordered.     Assessment / Plan / Recommendation Clinical Impression  Portions of MOCA given but entire exam unable to be administered due to level of cognitive deficits.   Pt with poor attention, working memory, and awareness resulting in poor safety awareness.   Pt perseverating on his family/home situation during evaluation - often changing topics.  Mild dysarthria apparent resultingi n imprecise articulation- suspect may be baseline.    Unfortunately no family present to establish  baseline, therefore will follow up for establishment  and compensation strategies.  Reviewed safety measures with pt and importance of using call bell for assist.   Of note, pt reports dysphagia resulting in coughing on liquids and states this is new. Given Wallenberg degeneration, suggest swallow evaluation be ordered.      SLP Assessment  SLP Recommendation/Assessment: Patient needs continued Speech Lanaguage Pathology Services SLP Visit Diagnosis: Cognitive communication deficit (R41.841)    Follow Up Recommendations  Skilled Nursing facility    Frequency and Duration min 1 x/week  1 week      SLP Evaluation Cognition  Overall Cognitive Status: No family/caregiver present to determine baseline cognitive functioning Arousal/Alertness: Awake/alert Orientation Level: Oriented to person;Oriented to place;Disoriented to situation;Disoriented to time Attention: Sustained Sustained Attention: Appears intact Memory: Impaired Memory Impairment: Storage deficit(did not recall correct year after 10 minutes when provided with choice of 2, able to identify correctly however with verbal choice of 2) Awareness: Impaired Awareness Impairment: Intellectual impairment(pt states he can get up independently) Problem Solving: Impaired Problem Solving Impairment: Functional basic(locating call bell required assistance) Safety/Judgment: Impaired       Comprehension  Auditory Comprehension Overall Auditory Comprehension: Appears within functional limits for tasks assessed Yes/No Questions: Not tested Commands: Within Functional Limits(for basic one step tasks) Conversation: Simple Interfering Components: Attention;Working memory;Processing speed EffectiveTechniques: Repetition Counsellor: Not tested Reading Comprehension Reading Status: (pt able to read time on clock, did not test furhter)    Expression Expression Primary Mode of Expression: Verbal Verbal  Expression Overall Verbal Expression: Appears within functional limits for  tasks assessed Initiation: No impairment Repetition: Impaired Level of Impairment: Sentence level Naming: No impairment Pragmatics: No impairment Other Verbal Expression Comments: perseveration on children noted Written Expression Dominant Hand: Right Written Expression: Not tested   Oral / Motor  Oral Motor/Sensory Function Overall Oral Motor/Sensory Function: Generalized oral weakness Motor Speech Overall Motor Speech: Other (comment)(generalized weakness) Respiration: Within functional limits Resonance: Within functional limits Articulation: Impaired Motor Planning: Witnin functional limits Motor Speech Errors: Not applicable Effective Techniques: Slow rate;Increased vocal intensity   GO                    Mickie BailKimball, Carsten Carstarphen Ann Eshika Reckart BoyleKimball, [place] [facility] SLP [phone]

## 2017-05-15 NOTE — Care Management Important Message (Signed)
Important Message  Patient Details  Name: Johnathan BeardsJames C Nicolaou MRN: [id number] Date of Birth: January 11, 1935   Medicare Important Message Given:  Yes    Dorena BodoIris Timmi Devora 05/15/2017, 3:15 PM

## 2017-05-15 NOTE — Care Management Note (Addendum)
Case Management Note  Patient Details  Name: Johnathan Mitchell MRN: [id number] Date of Birth: 1934/09/25  Subjective/Objective:                  Presented with slurred speech and bilateral arm weakness w hypotension, hx  of HTN, CVA, and asthma. In ED found to have left traumatic femur fracture.  Patient had had mechanical fall @ home PTA. From home with daughter. Owns walker, wheelchair.   4/16 S/p INTRAMEDULLARY (IM) NAIL FEMORAL (Left)     Bonnell PublicJaime Luo (Daughter) Donna Christenloise Rose (Sister)     [phone]      PCP:  Action/Plan: Transition to SNF when medically stable.Marland Kitchen.Marland Kitchen.NCM following for disposition needs.  Expected Discharge Date:                  Expected Discharge Plan:     In-House Referral:  Clinical Social Work  Discharge planning Services  CM Consult  Post Acute Care Choice:    Choice offered to:     DME Arranged:    DME Agency:     HH Arranged:    HH Agency:     Status of Service:  In process, will continue to follow  If discussed at Long Length of Stay Meetings, dates discussed:    Additional Comments:  Epifanio LeschesCole, Mikal Blasdell Hudson, RN 05/15/2017, 3:57 PM

## 2017-05-15 NOTE — Progress Notes (Signed)
Pharmacy Consult - Phos replacement  Phos level = 2.2  Plan: K phos 10 mmol x 1 Recheck lab in am  Thank you Okey RegalLisa Katrenia Alkins, PharmD [phone]

## 2017-05-15 NOTE — Evaluation (Signed)
Occupational Therapy Evaluation Patient Details Name: Johnathan Mitchell MRN: [id number] DOB: May 03, 1934 Today's Date: 05/15/2017    History of Present Illness 83 y.o. male with prior CVA with right-sided residual weakness is wheelchair-bound, dementia, hypertension, seizure who presents to the ED with acute transient aphasia, flaccidity on the right side apparently s/p fall from wheelchair at his home, from which pt felt immediate pain in his L thigh. Pt s/p L IM nail proceedure. WBAT. CT of the brain did not show any acute intracranial abnormalities.   Clinical Impression   Pt was assisted for ADL and IADL prior to admission. He lives with his daughter and grandchildren. Pt presents with baseline impairment in cognition, L LE pain, generalized weakness and impaired standing balance. He requires set up to total assist for ADL. Pt will need post acute rehab in SNF. Daughter present for session and in agreement.     Follow Up Recommendations  SNF;Supervision/Assistance - 24 hour    Equipment Recommendations       Recommendations for Other Services       Precautions / Restrictions Precautions Precautions: Fall Precaution Comments: pt attempting OOB alone at time of fall per daughter Restrictions LLE Weight Bearing: Weight bearing as tolerated      Mobility Bed Mobility Overal bed mobility: Needs Assistance Bed Mobility: Supine to Sit     Supine to sit: Min assist     General bed mobility comments: assist for L LE, cues for technique, increased time  Transfers Overall transfer level: Needs assistance Equipment used: Rolling walker (2 wheeled) Transfers: Sit to/from Stand Sit to Stand: +2 physical assistance;Min assist;From elevated surface         General transfer comment: assist to rise and steady, pt is used to pulling up on the cross bar of the walker, R foot blocked    Balance Overall balance assessment: Needs assistance   Sitting balance-Leahy Scale: Fair        Standing balance-Leahy Scale: Poor Standing balance comment: B UE and external assist needed for balance                           ADL either performed or assessed with clinical judgement   ADL Overall ADL's : Needs assistance/impaired Eating/Feeding: Independent;Sitting Eating/Feeding Details (indicate cue type and reason): able to open packages Grooming: Set up;Sitting   Upper Body Bathing: Minimal assistance;Sitting   Lower Body Bathing: Total assistance;+2 for physical assistance;Sit to/from stand   Upper Body Dressing : Minimal assistance;Sitting   Lower Body Dressing: Total assistance;+2 for physical assistance;Sit to/from stand   Toilet Transfer: +2 for physical assistance;Minimal assistance;Stand-pivot;RW   Toileting- Clothing Manipulation and Hygiene: Total assistance;+2 for physical assistance;Sit to/from stand       Functional mobility during ADLs: +2 for physical assistance;Minimal assistance;Cueing for safety;Cueing for sequencing;Rolling walker       Vision Baseline Vision/History: Wears glasses Wears Glasses: Reading only Patient Visual Report: No change from baseline       Perception     Praxis      Pertinent Vitals/Pain Pain Assessment: 0-10 Faces Pain Scale: Hurts even more Pain Location: L LE Pain Descriptors / Indicators: Grimacing;Guarding;Discomfort;Sore Pain Intervention(s): Monitored during session;Repositioned;Ice applied     Hand Dominance Right   Extremity/Trunk Assessment Upper Extremity Assessment Upper Extremity Assessment: Generalized weakness   Lower Extremity Assessment Lower Extremity Assessment: Defer to PT evaluation       Communication Communication Communication: No difficulties   Cognition Arousal/Alertness:  Awake/alert Behavior During Therapy: WFL for tasks assessed/performed Overall Cognitive Status: History of cognitive impairments - at baseline                                      General Comments       Exercises     Shoulder Instructions      Home Living Family/patient expects to be discharged to:: Skilled nursing facility Living Arrangements: Children;Other relatives Available Help at Discharge: Family;Available 24 hours/day Type of Home: House Home Access: Ramped entrance     Home Layout: One level     Bathroom Shower/Tub: Tub/shower unit         Home Equipment: Environmental consultantWalker - 2 wheels;Wheelchair - manual;Shower seat          Prior Functioning/Environment Level of Independence: Needs assistance  Gait / Transfers Assistance Needed: reports using RW to transfer to w/c PTA ADL's / Homemaking Assistance Needed: reports daughter assists with bathing and dressing and all IADL            OT Problem List: Decreased strength;Decreased activity tolerance;Impaired balance (sitting and/or standing);Decreased cognition;Decreased knowledge of use of DME or AE;Pain      OT Treatment/Interventions:      OT Goals(Current goals can be found in the care plan section) Acute Rehab OT Goals Patient Stated Goal: get home  OT Frequency:     Barriers to D/C:            Co-evaluation PT/OT/SLP Co-Evaluation/Treatment: Yes Reason for Co-Treatment: For patient/therapist safety;To address functional/ADL transfers   OT goals addressed during session: ADL's and self-care      AM-PAC PT "6 Clicks" Daily Activity     Outcome Measure Help from another person eating meals?: None Help from another person taking care of personal grooming?: A Little Help from another person toileting, which includes using toliet, bedpan, or urinal?: Total Help from another person bathing (including washing, rinsing, drying)?: A Lot Help from another person to put on and taking off regular upper body clothing?: A Little Help from another person to put on and taking off regular lower body clothing?: Total 6 Click Score: 14   End of Session Equipment Utilized During Treatment: Gait  belt;Rolling walker  Activity Tolerance: Patient tolerated treatment well Patient left: in chair;with call bell/phone within reach;with chair alarm set;with family/visitor present  OT Visit Diagnosis: Unsteadiness on feet (R26.81);Other abnormalities of gait and mobility (R26.89);Pain;Other symptoms and signs involving cognitive function                Time: [id number] OT Time Calculation (min): 23 min Charges:  OT General Charges $OT Visit: 1 Visit OT Evaluation $OT Eval Moderate Complexity: 1 Mod G-Codes:     05/15/2017 Martie RoundJulie Berit Raczkowski, OTR/L Pager: [phone]  Iran PlanasMayberry, [place] BailiffJulie Lynn 05/15/2017, 4:13 PM

## 2017-05-15 NOTE — Clinical Social Work Note (Signed)
Clinical Social Work Assessment  Patient Details  Name: Johnathan Mitchell MRN: [id number] Date of Birth: 12-08-1934  Date of referral:  05/15/17               Reason for consult:  Facility Placement, Discharge Planning                Permission sought to share information with:  Family Supports, Magazine features editoracility Contact Representative Permission granted to share information::  Yes, Verbal Permission Granted  Name::     Johnathan Mitchell  Agency::  snf  Relationship::  daughter  Contact Information:  [phone]  Housing/Transportation Living arrangements for the past 2 months:  Single Family Home Source of Information:  Adult Children Patient Interpreter Needed:  None Criminal Activity/Legal Involvement Pertinent to Current Situation/Hospitalization:  No - Comment as needed Significant Relationships:  Adult Children Lives with:  Adult Children Do you feel safe going back to the place where you live?  Yes Need for family participation in patient care:  Yes (Comment)  Care giving concerns:  No family at bedside. Patient stated his daughter had just left to try and catch  the bus. Patient gave CSW verbal permission to talk patient daughter via phone. Patient stated he really wants to leave because his ready to get out of the hospital  Social Worker assessment / plan: CSW contacted patients daughter Johnathan Muir(Jamie) via phone to discuss disposition plan. Johnathan MuirJamie stated she would like patient to go to rehab short term to get his strength back up. Johnathan MuirJamie stated she would like patient to go to facility close to home since she has to take a bus to see patient. CSW will fax patient out and follow up with family for bed offers Employment status:    Insurance information:  Medicare PT Recommendations:  Skilled Nursing Facility Information / Referral to community resources:  Skilled Nursing Facility  Patient/Family's Response to care:  Patient very pleasant during conversation with CSW. Patient eager to leave hospital   Patient/Family's Understanding of and Emotional Response to Diagnosis, Current Treatment, and Prognosis:  CSW to follow up with family once bed offers are available    Emotional Assessment Appearance:  Appears stated age Attitude/Demeanor/Rapport:  Engaged Affect (typically observed):  Accepting Orientation:  Oriented to Self, Oriented to Situation, Oriented to Place, Oriented to  Time Alcohol / Substance use:  Not Applicable Psych involvement (Current and /or in the community):  No (Comment)  Discharge Needs  Concerns to be addressed:  No discharge needs identified Readmission within the last 30 days:  No Current discharge risk:  Dependent with Mobility Barriers to Discharge:  No SNF bed   Althea CharonAshley C Erlinda Solinger, LCSW 05/15/2017, 5:37 PM

## 2017-05-15 NOTE — Progress Notes (Signed)
Orthopedic Trauma Service Progress Note   [name] MRN: [id number] DOB/AGE: 1934-12-28 83 y.o.  Subjective:  Appears to be doing well Transferred from bed to chair with therapies while I was in room   Daughter at bedside as well  Stated that PTA pt had minimal ambulation but did ambulate with walker.  They were having PT come to the house 1-2 x/week to help maintain mobility as pt was becoming severely deconditioned   Pt is an Office manager    ROS As above  Objective:   VITALS:   Vitals:   05/14/17 1742 05/14/17 2146 05/15/17 0619 05/15/17 1501  BP: 117/74 (!) 113/92 116/85 113/77  Pulse: 84 78 70 92  Resp: 16 12 12 18   Temp: 98 F (36.7 C) 98.2 F (36.8 C) 97.8 F (36.6 C) 98.3 F (36.8 C)  TempSrc: Oral   Oral  SpO2: 100% 97% 99% 97%  Weight:      Height:        Estimated body mass index is 24.21 kg/m as calculated from the following:   Height as of this encounter: 5\' 6"  (1.676 m).   Weight as of this encounter: 68 kg (150 lb).   Intake/Output      04/17 0701 - 04/18 0700 04/18 0701 - 04/19 0700   P.O. 140 120   I.V. (mL/kg)     Blood 430    IV Piggyback     Total Intake(mL/kg) 570 (8.4) 120 (1.8)   Urine (mL/kg/hr) 521 (0.3)    Blood     Total Output 521    Net +49 +120          LABS  Results for orders placed or performed during the hospital encounter of 05/11/17 (from the past 24 hour(s))  Prealbumin     Status: Abnormal   Collection Time: 05/15/17  4:21 AM  Result Value Ref Range   Prealbumin 12.2 (L) 18 - 38 mg/dL  Comprehensive metabolic panel     Status: Abnormal   Collection Time: 05/15/17  4:21 AM  Result Value Ref Range   Sodium 136 135 - 145 mmol/L   Potassium 4.1 3.5 - 5.1 mmol/L   Chloride 101 101 - 111 mmol/L   CO2 28 22 - 32 mmol/L   Glucose, Bld 98 65 - 99 mg/dL   BUN 14 6 - 20 mg/dL   Creatinine, Ser 4.09 0.61 - 1.24 mg/dL   Calcium 8.2 (L) 8.9 - 10.3 mg/dL   Total Protein 5.8 (L) 6.5 -  8.1 g/dL   Albumin 2.5 (L) 3.5 - 5.0 g/dL   AST 64 (H) 15 - 41 U/L   ALT 30 17 - 63 U/L   Alkaline Phosphatase 37 (L) 38 - 126 U/L   Total Bilirubin 0.9 0.3 - 1.2 mg/dL   GFR calc non Af Amer >60 >60 mL/min   GFR calc Af Amer >60 >60 mL/min   Anion gap 7 5 - 15  Magnesium     Status: None   Collection Time: 05/15/17  4:21 AM  Result Value Ref Range   Magnesium 1.9 1.7 - 2.4 mg/dL  Phosphorus     Status: Abnormal   Collection Time: 05/15/17  4:21 AM  Result Value Ref Range   Phosphorus 2.2 (L) 2.5 - 4.6 mg/dL  TSH     Status: None   Collection Time: 05/15/17  4:21 AM  Result Value Ref Range   TSH 1.845 0.350 - 4.500 uIU/mL  CBC  Status: Abnormal   Collection Time: 05/15/17  4:21 AM  Result Value Ref Range   WBC 10.3 4.0 - 10.5 K/uL   RBC 3.38 (L) 4.22 - 5.81 MIL/uL   Hemoglobin 9.3 (L) 13.0 - 17.0 g/dL   HCT 16.128.0 (L) 09.639.0 - 04.552.0 %   MCV 82.8 78.0 - 100.0 fL   MCH 27.5 26.0 - 34.0 pg   MCHC 33.2 30.0 - 36.0 g/dL   RDW 40.915.2 81.111.5 - 91.415.5 %   Platelets 118 (L) 150 - 400 K/uL     PHYSICAL EXAM:   Gen: awake and alert, comfortable appearing, good spirits  Ext:       Left Lower Extremity   Dressings removed  Incisions look fantastic    No signs of infection    Scant bloody drainage      Distal motor and sensory functions intact  Ext warm   + DP pulse  Swelling controlled  Excellent knee ROM   Assessment/Plan: 2 Days Post-Op   Principal Problem:   Stroke [facility](HCC) Active Problems:   Cerebral infarction (HCC)   Benign essential HTN   HTN (hypertension)   Dementia with behavioral disturbance   Femur fracture (HCC)   AKI (acute kidney injury) (HCC)   Anti-infectives (From admission, onward)   Start     Dose/Rate Route Frequency Ordered Stop   05/13/17 1700  clindamycin (CLEOCIN) IVPB 600 mg     600 mg 100 mL/hr over 30 Minutes Intravenous Every 6 hours 05/13/17 1408 05/14/17 0640   05/13/17 0930  clindamycin (CLEOCIN) IVPB 900 mg     900 mg 100 mL/hr over 30  Minutes Intravenous To ShortStay Surgical 05/13/17 0737 05/13/17 1100   05/13/17 0600  ceFAZolin (ANCEF) IVPB 2g/100 mL premix  Status:  Discontinued     2 g 200 mL/hr over 30 Minutes Intravenous On call to O.R. 05/12/17 2219 05/12/17 2244    .  POD/HD#: 2  83 y/o black male with complex medical history, Wheelchair dependent s/p fall with L subtrochanteric femur fracture   - fall, wheelchair dependent   -L subtrochanteric femur fracture s/p IMN             WBAT L leg             No ROM restrictions             PT/OT              Ice prn              Dressing changed today    Change dressings as needed                           Metabolic bone work up                          Suspect baseline osteoporosis                         Recommend outpt dexa    Low prealbumin, low albumin, low calcium but corrected is appropriate. Ionized calcium pending.    Vitamin D panel pending    Hypophosphatemia---being replaced      Suspect these deficiencies are due to poor PO intake    RD consult    - Pain management:             Scheduled tylenol  Oxy IR for severe pain    - ABL anemia/Hemodynamics            tolerated transfusion well yesterday  H/h today is 9.3/28.0  CBC in am    - Medical issues              Per Stroke team and IM   - DVT/PE prophylaxis:             No restrictions for anticoagulation from ortho standpoint   - ID:              periop abx   - Metabolic Bone Disease:             Lab work up              Suspect osteoporosis given fx with ground level fall             DEXA as outpt    - Activity:             WBAT L leg   - Dispo:             PT/OT  SNF  Ok for SNF from ortho standpoint    Mearl Latin, PA-C [facility] [phone] (P) [phone] Traci Sermon (C) 05/15/2017, 3:50 PM

## 2017-05-15 NOTE — Progress Notes (Signed)
Physical Therapy Treatment Patient Details Name: Johnathan Mitchell MRN: [id number] DOB: 09-02-1934 Today's Date: 05/15/2017    History of Present Illness 83 y.o. male with prior CVA with right-sided residual weakness is wheelchair-bound, dementia, hypertension, seizure who presents to the ED with acute transient aphasia, flaccidity on the right side apparently s/p fall from wheelchair at his home, from which pt felt immediate pain in his L thigh. Pt s/p L IM nail proceedure. WBAT. CT of the brain did not show any acute intracranial abnormalities.    PT Comments    Pt very agreeable to therapy today. Pt daughter present and aided with clarifying prior activity level and assistance required for ADLs and iADLs. Pt was minA for bed mobility for movement of L LE off of bed and minAx2 for stand pivot transfer to recliner using RW.  PT continues to recommend SNF level rehab due to decreased safety awareness and mobility below PLOF. Pt and daughter are in agreement with recommendation. Pt will continue to follow acutely.   Follow Up Recommendations  SNF     Equipment Recommendations  None recommended by PT    Recommendations for Other Services       Precautions / Restrictions Precautions Precautions: Fall Precaution Comments: pt attempting OOB alone at time of fall per daughter Restrictions Weight Bearing Restrictions: Yes LLE Weight Bearing: Weight bearing as tolerated    Mobility  Bed Mobility Overal bed mobility: Needs Assistance Bed Mobility: Supine to Sit     Supine to sit: Min assist     General bed mobility comments: assist for L LE, cues for technique, increased time  Transfers Overall transfer level: Needs assistance Equipment used: Rolling walker (2 wheeled) Transfers: Sit to/from Stand Sit to Stand: +2 physical assistance;Min assist;From elevated surface         General transfer comment: assist to rise and steady, pt is used to pulling up on the cross bar of the  walker, R foot blocked        Balance Overall balance assessment: Needs assistance   Sitting balance-Leahy Scale: Fair       Standing balance-Leahy Scale: Poor Standing balance comment: B UE and external assist needed for balance                            Cognition Arousal/Alertness: Awake/alert Behavior During Therapy: WFL for tasks assessed/performed Overall Cognitive Status: History of cognitive impairments - at baseline                                           General Comments  Daughter present during session.       Pertinent Vitals/Pain Pain Assessment: Faces Faces Pain Scale: Hurts even more Pain Location: L LE Pain Descriptors / Indicators: Grimacing;Guarding;Discomfort;Sore Pain Intervention(s): Monitored during session;Limited activity within patient's tolerance;Repositioned    Home Living Family/patient expects to be discharged to:: Skilled nursing facility Living Arrangements: Children;Other relatives Available Help at Discharge: Family;Available 24 hours/day Type of Home: House Home Access: Ramped entrance   Home Layout: One level Home Equipment: Walker - 2 wheels;Wheelchair - manual;Shower seat      Prior Function Level of Independence: Needs assistance  Gait / Transfers Assistance Needed: reports using RW to transfer to w/c PTA ADL's / Homemaking Assistance Needed: reports daughter assists with bathing and dressing and all IADL  PT Goals (current goals can now be found in the care plan section) Acute Rehab PT Goals Patient Stated Goal: get home PT Goal Formulation: With patient Time For Goal Achievement: 05/28/17 Potential to Achieve Goals: Fair Progress towards PT goals: Progressing toward goals    Frequency    Min 3X/week      PT Plan Current plan remains appropriate    Co-evaluation PT/OT/SLP Co-Evaluation/Treatment: Yes Reason for Co-Treatment: For patient/therapist safety PT goals addressed  during session: Mobility/safety with mobility OT goals addressed during session: ADL's and self-care      AM-PAC PT "6 Clicks" Daily Activity  Outcome Measure  Difficulty turning over in bed (including adjusting bedclothes, sheets and blankets)?: A Little Difficulty moving from lying on back to sitting on the side of the bed? : Unable Difficulty sitting down on and standing up from a chair with arms (e.g., wheelchair, bedside commode, etc,.)?: Unable Help needed moving to and from a bed to chair (including a wheelchair)?: A Little Help needed walking in hospital room?: A Little Help needed climbing 3-5 steps with a railing? : Total 6 Click Score: 12    End of Session Equipment Utilized During Treatment: Gait belt Activity Tolerance: Patient tolerated treatment well Patient left: in chair;with call bell/phone within reach;with chair alarm set Nurse Communication: Mobility status;Need for lift equipment PT Visit Diagnosis: Unsteadiness on feet (R26.81);Other abnormalities of gait and mobility (R26.89);Muscle weakness (generalized) (M62.81);Difficulty in walking, not elsewhere classified (R26.2);History of falling (Z91.81)     Time: [id number] PT Time Calculation (min) (ACUTE ONLY): 23 min  Charges:  $Therapeutic Activity: 8-22 mins                    G Codes:       Anntionette Madkins B. Beverely RisenVan Mitchell PT, DPT Acute Rehabilitation  [phone] Pager [phone]   Johnathan Mitchell 05/15/2017, 4:26 PM

## 2017-05-16 DIAGNOSIS — Z7982 Long term (current) use of aspirin: Secondary | ICD-10-CM | POA: Diagnosis not present

## 2017-05-16 DIAGNOSIS — R488 Other symbolic dysfunctions: Secondary | ICD-10-CM | POA: Diagnosis not present

## 2017-05-16 DIAGNOSIS — J449 Chronic obstructive pulmonary disease, unspecified: Secondary | ICD-10-CM | POA: Diagnosis not present

## 2017-05-16 DIAGNOSIS — F39 Unspecified mood [affective] disorder: Secondary | ICD-10-CM | POA: Diagnosis not present

## 2017-05-16 DIAGNOSIS — K5903 Drug induced constipation: Secondary | ICD-10-CM | POA: Diagnosis not present

## 2017-05-16 DIAGNOSIS — G40909 Epilepsy, unspecified, not intractable, without status epilepticus: Secondary | ICD-10-CM | POA: Diagnosis not present

## 2017-05-16 DIAGNOSIS — G8190 Hemiplegia, unspecified affecting unspecified side: Secondary | ICD-10-CM | POA: Diagnosis not present

## 2017-05-16 DIAGNOSIS — E785 Hyperlipidemia, unspecified: Secondary | ICD-10-CM | POA: Diagnosis not present

## 2017-05-16 DIAGNOSIS — S7292XS Unspecified fracture of left femur, sequela: Secondary | ICD-10-CM | POA: Diagnosis not present

## 2017-05-16 DIAGNOSIS — I7772 Dissection of iliac artery: Secondary | ICD-10-CM | POA: Diagnosis not present

## 2017-05-16 DIAGNOSIS — Z9181 History of falling: Secondary | ICD-10-CM | POA: Diagnosis not present

## 2017-05-16 DIAGNOSIS — M810 Age-related osteoporosis without current pathological fracture: Secondary | ICD-10-CM | POA: Diagnosis not present

## 2017-05-16 DIAGNOSIS — G459 Transient cerebral ischemic attack, unspecified: Secondary | ICD-10-CM | POA: Diagnosis not present

## 2017-05-16 DIAGNOSIS — M8000XS Age-related osteoporosis with current pathological fracture, unspecified site, sequela: Secondary | ICD-10-CM | POA: Diagnosis not present

## 2017-05-16 DIAGNOSIS — M6281 Muscle weakness (generalized): Secondary | ICD-10-CM | POA: Diagnosis not present

## 2017-05-16 DIAGNOSIS — S7222XD Displaced subtrochanteric fracture of left femur, subsequent encounter for closed fracture with routine healing: Secondary | ICD-10-CM | POA: Diagnosis not present

## 2017-05-16 DIAGNOSIS — R4689 Other symptoms and signs involving appearance and behavior: Secondary | ICD-10-CM | POA: Diagnosis not present

## 2017-05-16 DIAGNOSIS — I959 Hypotension, unspecified: Secondary | ICD-10-CM | POA: Diagnosis not present

## 2017-05-16 DIAGNOSIS — F419 Anxiety disorder, unspecified: Secondary | ICD-10-CM | POA: Diagnosis not present

## 2017-05-16 DIAGNOSIS — Z8673 Personal history of transient ischemic attack (TIA), and cerebral infarction without residual deficits: Secondary | ICD-10-CM | POA: Diagnosis not present

## 2017-05-16 DIAGNOSIS — E46 Unspecified protein-calorie malnutrition: Secondary | ICD-10-CM | POA: Diagnosis not present

## 2017-05-16 DIAGNOSIS — F329 Major depressive disorder, single episode, unspecified: Secondary | ICD-10-CM | POA: Diagnosis not present

## 2017-05-16 DIAGNOSIS — R569 Unspecified convulsions: Secondary | ICD-10-CM | POA: Diagnosis not present

## 2017-05-16 DIAGNOSIS — D62 Acute posthemorrhagic anemia: Secondary | ICD-10-CM | POA: Diagnosis not present

## 2017-05-16 DIAGNOSIS — S7292XA Unspecified fracture of left femur, initial encounter for closed fracture: Secondary | ICD-10-CM | POA: Diagnosis not present

## 2017-05-16 DIAGNOSIS — F0151 Vascular dementia with behavioral disturbance: Secondary | ICD-10-CM | POA: Diagnosis not present

## 2017-05-16 DIAGNOSIS — I639 Cerebral infarction, unspecified: Secondary | ICD-10-CM | POA: Diagnosis not present

## 2017-05-16 DIAGNOSIS — Z1331 Encounter for screening for depression: Secondary | ICD-10-CM | POA: Diagnosis not present

## 2017-05-16 DIAGNOSIS — R2689 Other abnormalities of gait and mobility: Secondary | ICD-10-CM | POA: Diagnosis not present

## 2017-05-16 DIAGNOSIS — R278 Other lack of coordination: Secondary | ICD-10-CM | POA: Diagnosis not present

## 2017-05-16 DIAGNOSIS — I10 Essential (primary) hypertension: Secondary | ICD-10-CM | POA: Diagnosis not present

## 2017-05-16 DIAGNOSIS — F339 Major depressive disorder, recurrent, unspecified: Secondary | ICD-10-CM | POA: Diagnosis not present

## 2017-05-16 DIAGNOSIS — G464 Cerebellar stroke syndrome: Secondary | ICD-10-CM | POA: Diagnosis not present

## 2017-05-16 DIAGNOSIS — F0391 Unspecified dementia with behavioral disturbance: Secondary | ICD-10-CM | POA: Diagnosis not present

## 2017-05-16 DIAGNOSIS — N179 Acute kidney failure, unspecified: Secondary | ICD-10-CM | POA: Diagnosis not present

## 2017-05-16 DIAGNOSIS — R1312 Dysphagia, oropharyngeal phase: Secondary | ICD-10-CM | POA: Diagnosis not present

## 2017-05-16 LAB — CALCIUM, IONIZED: CALCIUM, IONIZED, SERUM: 4.8 mg/dL (ref 4.5–5.6)

## 2017-05-16 LAB — HEPATIC FUNCTION PANEL
ALBUMIN: 2.2 g/dL — AB (ref 3.5–5.0)
ALK PHOS: 41 U/L (ref 38–126)
ALT: 32 U/L (ref 17–63)
AST: 60 U/L — AB (ref 15–41)
Bilirubin, Direct: 0.2 mg/dL (ref 0.1–0.5)
Indirect Bilirubin: 0.7 mg/dL (ref 0.3–0.9)
TOTAL PROTEIN: 5.5 g/dL — AB (ref 6.5–8.1)
Total Bilirubin: 0.9 mg/dL (ref 0.3–1.2)

## 2017-05-16 LAB — CALCITRIOL (1,25 DI-OH VIT D): VIT D 1 25 DIHYDROXY: 151 pg/mL — AB (ref 19.9–79.3)

## 2017-05-16 LAB — MAGNESIUM: Magnesium: 1.9 mg/dL (ref 1.7–2.4)

## 2017-05-16 LAB — PTH, INTACT AND CALCIUM
Calcium, Total (PTH): 8.2 mg/dL — ABNORMAL LOW (ref 8.6–10.2)
PTH: 63 pg/mL (ref 15–65)

## 2017-05-16 LAB — CBC
HEMATOCRIT: 29.1 % — AB (ref 39.0–52.0)
HEMOGLOBIN: 9.3 g/dL — AB (ref 13.0–17.0)
MCH: 26.9 pg (ref 26.0–34.0)
MCHC: 32 g/dL (ref 30.0–36.0)
MCV: 84.1 fL (ref 78.0–100.0)
Platelets: 166 10*3/uL (ref 150–400)
RBC: 3.46 MIL/uL — AB (ref 4.22–5.81)
RDW: 15.6 % — ABNORMAL HIGH (ref 11.5–15.5)
WBC: 11.9 10*3/uL — AB (ref 4.0–10.5)

## 2017-05-16 LAB — VITAMIN D 25 HYDROXY (VIT D DEFICIENCY, FRACTURES): VIT D 25 HYDROXY: 38.4 ng/mL (ref 30.0–100.0)

## 2017-05-16 LAB — PHOSPHORUS: PHOSPHORUS: 2.9 mg/dL (ref 2.5–4.6)

## 2017-05-16 MED ORDER — ACETAMINOPHEN 325 MG PO TABS: 650.0000 mg | ORAL_TABLET | Freq: Four times a day (QID) | ORAL | 0 refills | Status: DC | PRN

## 2017-05-16 MED ORDER — ENSURE ENLIVE PO LIQD
237.0000 mL | Freq: Two times a day (BID) | ORAL | Status: DC
  Administered 2017-05-16: 237 mL via ORAL

## 2017-05-16 MED ORDER — VITAMIN D 1000 UNITS PO TABS
2000.0000 [IU] | ORAL_TABLET | Freq: Every day | ORAL | Status: DC
  Administered 2017-05-16: 2000 [IU] via ORAL
  Filled 2017-05-16: qty 2

## 2017-05-16 MED ORDER — CHOLECALCIFEROL 50 MCG (2000 UT) PO TABS: 2000.0000 [IU] | ORAL_TABLET | Freq: Every day | ORAL | 1 refills | Status: DC

## 2017-05-16 MED ORDER — OXYCODONE HCL 5 MG PO TABS: 2.5000 mg | ORAL_TABLET | Freq: Four times a day (QID) | ORAL | 0 refills | Status: DC | PRN

## 2017-05-16 MED ORDER — ASPIRIN 325 MG PO TABS: 325.0000 mg | ORAL_TABLET | Freq: Every day | ORAL | Status: DC

## 2017-05-16 MED ORDER — VITAMIN C 500 MG PO TABS
500.0000 mg | ORAL_TABLET | Freq: Every day | ORAL | Status: DC
  Administered 2017-05-16: 500 mg via ORAL
  Filled 2017-05-16: qty 1

## 2017-05-16 MED ORDER — ENSURE ENLIVE PO LIQD: 237.0000 mL | Freq: Two times a day (BID) | ORAL | Status: DC

## 2017-05-16 MED ORDER — ASCORBIC ACID 500 MG PO TABS: 500.0000 mg | ORAL_TABLET | Freq: Every day | ORAL | 1 refills | Status: DC

## 2017-05-16 MED ORDER — CALCIUM CITRATE 950 (200 CA) MG PO TABS: 200.0000 mg | ORAL_TABLET | Freq: Two times a day (BID) | ORAL | 1 refills | Status: DC

## 2017-05-16 MED ORDER — ATORVASTATIN CALCIUM 80 MG PO TABS: 80.0000 mg | ORAL_TABLET | Freq: Every day | ORAL | Status: DC

## 2017-05-16 MED ORDER — CALCIUM CITRATE 950 (200 CA) MG PO TABS
200.0000 mg | ORAL_TABLET | Freq: Two times a day (BID) | ORAL | Status: DC
  Filled 2017-05-16 (×3): qty 1

## 2017-05-16 MED ORDER — DOCUSATE SODIUM 100 MG PO CAPS: 100.0000 mg | ORAL_CAPSULE | Freq: Two times a day (BID) | ORAL | Status: DC

## 2017-05-16 NOTE — Discharge Summary (Addendum)
Physician Discharge Summary  Johnathan Mitchell EXB:[id number] DOB: 05-Dec-1934  PCP: System, Provider Not In  Admit date: 05/11/2017 Discharge date: 05/16/2017  Recommendations for Outpatient Follow-up:  1. MD at SNF in 3 days with repeat labs (CBC & BMP). 2. Dr. [name], Orthopedic surgery in 10 days for postop follow-up. 3. Dr. Delia Heady, Neurology in 4 weeks. Ambulatory referral sent. 4. [name], NP/[facility] on November 14, 2017 at 8:45 AM with results of aortoiliac duplex to be done same day at 8 AM.  Home Health: N/A Equipment/Devices: N/A    Discharge Condition: Improved and stable. CODE STATUS: Full Diet recommendation: Heart healthy diet.  Discharge Diagnoses:  Principal Problem:   Stroke Johnathan Mitchell) Active Problems:   Cerebral infarction (HCC)   Benign essential HTN   HTN (hypertension)   Dementia with behavioral disturbance   Femur fracture (HCC)   AKI (acute kidney injury) [facility])   Brief Summary: 83 year old male with PMH of prior CVA with residual right-sided weakness, wheelchair-bound, dementia, HTN, seizure, fell on day of admission and presented to the ED with acute transient aphasia, right-sided weakness worse than prior.  EMS was activated and noted SBP in 70s.  He was noted to have a very large hematoma in the left hip area.  He was admitted for acute left hip fracture and TIA.  Orthopedics and neurology evaluated.   Assessment & Plan:   Left subtrochanteric femur fracture: Orthopedics was consulted and patient underwent intramedullary nail/ORIF.  As per orthopedics, suspect baseline osteoporosis and hence recommend outpatient DEXA scan.    Orthopedics has evaluated today and made recommendations outlined as below and have cleared him for discharge to SNF.  Hypophosphatemia was replaced.  Magnesium normal.  Vitamin D 2: 38.4 and vitamin D3: 151.  Intact PTH: 63.  Orthopedics have started patient on oral calcium and vitamin D  supplements.  "Weightbearing: WBAT LLE Insicional and dressing care: OK to remove dressings as of now and leave open to air with dry gauze PRN Orthopedic device(s): walker and wheelchair Showering: can shower now. Clean wounds only with soap and water. Can leave wounds uncovered  VTE prophylaxis: per medicine and Stroke team   Pain control: tylenol and oxy IR for severe pain  Follow - up plan: 10-14 days with orthopaedics  Contact information:  [name] MD, [name] PA-C"   Postop acute blood loss anemia: Hemoglobin dropped from 12.6 on 4/14-7.5.  He was transfused 2 units PRBCs.  Hemoglobin has improved to 9.3 and stable over the last 2 days.  TIA: Neurology was consulted and completed stroke workup.  I discussed with Dr. [name], Stroke MD on 4/17 and note appreciated >prior left hemispheric stroke presented with transient speech difficulties and right-sided weakness likely due to TIA versus small infarct in the setting of hypotension.  Recommends continuing aspirin and atorvastatin for stroke prevention and outpatient follow-up in stroke clinic.  No further stroke workup recommended.  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  Vascular surgery has arranged for outpatient imaging and follow-up.  Acute kidney injury: Likely due to hemodynamics.  Resolved.  Essential hypertension: Controlled.  Was hypotensive on arrival.  Lisinopril was held in the hospital and will be resumed at discharge.  History of anxiety: Continue Lexapro and Depakote.  Stable.  Thrombocytopenia: Likely due to acute blood loss.    Resolved.  Seizure disorder: Continue Depakote.  Dementia: No agitation.  Suspect moderate dementia.  COPD: Seen on imaging.  Stable without clinical bronchospasm.   Consultants:  Orthopedics Neurology Vascular  surgery  Procedures:  As above   Discharge Instructions  Discharge Instructions    Ambulatory referral to Neurology   Complete by:  As directed    An appointment is requested in approximately: 4 weeks   Call MD for:   Complete by:  As directed    Strokelike symptoms.   Call MD for:  difficulty breathing, headache or visual disturbances   Complete by:  As directed    Call MD for:  extreme fatigue   Complete by:  As directed    Call MD for:  persistant dizziness or light-headedness   Complete by:  As directed    Call MD for:  persistant nausea and vomiting   Complete by:  As directed    Call MD for:  redness, tenderness, or signs of infection (pain, swelling, redness, odor or green/yellow discharge around incision site)   Complete by:  As directed    Call MD for:  severe uncontrolled pain   Complete by:  As directed    Call MD for:  temperature >100.4   Complete by:  As directed    Diet - low sodium heart healthy   Complete by:  As directed    Increase activity slowly   Complete by:  As directed    Weight bearing as tolerated   Complete by:  As directed    Laterality:  left   Extremity:  Lower       Medication List    STOP taking these medications   traMADol 50 MG tablet Commonly known as:  ULTRAM   Vitamin D 2000 units Caps Replaced by:  Cholecalciferol 2000 units Tabs     TAKE these medications   acetaminophen 325 MG tablet Commonly known as:  TYLENOL Take 2 tablets (650 mg total) by mouth every 6 (six) hours as needed for mild pain or moderate pain (or temp > 37.5 C (99.5 F)).   ascorbic acid 500 MG tablet Commonly known as:  VITAMIN C Take 1 tablet (500 mg total) by mouth daily.   aspirin 325 MG tablet Take 1 tablet (325 mg total) by mouth daily. Start taking on:  05/17/2017 What changed:    medication strength  how much to take   atorvastatin 80 MG tablet Commonly known as:  LIPITOR Take 1 tablet (80 mg total) by mouth daily at 6 PM. What  changed:    medication strength  how much to take  when to take this   calcium citrate 950 MG tablet Commonly known as:  CALCITRATE - dosed in mg elemental calcium Take 1 tablet (200 mg of elemental calcium total) by mouth 2 (two) times daily.   Cholecalciferol 2000 units Tabs Take 1 tablet (2,000 Units total) by mouth daily. Replaces:  Vitamin D 2000 units Caps   divalproex 500 MG 24 hr tablet Commonly known as:  DEPAKOTE ER Take 500 mg by mouth at bedtime.   docusate sodium 100 MG capsule Commonly known as:  COLACE Take 1 capsule (100 mg total) by mouth 2 (two) times daily.   escitalopram 10 MG tablet Commonly known as:  LEXAPRO Take 10 mg by mouth daily.   feeding supplement (ENSURE ENLIVE) Liqd Take 237 mLs by mouth 2 (two) times daily between meals. Start taking on:  05/17/2017   lisinopril 5 MG tablet Commonly known as:  PRINIVIL,ZESTRIL Take  2.5 mg by mouth daily.   oxyCODONE 5 MG immediate release tablet Commonly known as:  Oxy IR/ROXICODONE Take 0.5-1 tablets (2.5-5 mg total) by mouth every 6 (six) hours as needed for severe pain or breakthrough pain.       Contact information for follow-up providers    [name], MD. Schedule an appointment as soon as possible for a visit in 10 day(s).   Specialty:  Orthopedic Surgery Contact information: [address] [place] [address] [phone]        MD at SNF. Schedule an appointment as soon as possible for a visit in 3 day(s).   Why:  To be seen with repeat labs (CBC & CMP).       Micki Riley, MD. Schedule an appointment as soon as possible for a visit in 4 week(s).   Specialties:  Neurology, Radiology Contact information: [address] [place] [address] [phone]            Contact information for after-discharge care    Destination    [facility] .   Service:  Skilled Nursing Contact information: [address] [place] [address] [phone]                 Allergies  Allergen Reactions  . Penicillins Hives and Swelling    Patient couldn't answer any questions      Procedures/Studies: Ct Angio Head W Or Wo Contrast  Result Date: 05/13/2017 CLINICAL DATA:  Stroke follow-up EXAM: CT ANGIOGRAPHY HEAD AND NECK TECHNIQUE: Multidetector CT imaging of the head and neck was performed using the standard protocol during bolus administration of intravenous contrast. Multiplanar CT image reconstructions and MIPs were obtained to evaluate the vascular anatomy. Carotid stenosis measurements (when applicable) are obtained utilizing NASCET criteria, using the distal internal carotid diameter as the denominator. CONTRAST:  50mL ISOVUE-370 IOPAMIDOL (ISOVUE-370) INJECTION 76% COMPARISON:  Head CT 05/11/2017 FINDINGS: CT HEAD FINDINGS BRAIN: No mass lesion, intraparenchymal hemorrhage or extra-axial collection. No evidence of acute cortical infarct. Extensive chronic ischemic white matter disease and encephalomalacia throughout much of the left hemisphere. Wallerian degeneration at the left cerebral peduncle. VASCULAR: No hyperdense vessel or unexpected vascular calcification. SKULL: Normal visualized skull base, calvarium and extracranial soft tissues. SINUSES/ORBITS: No sinus fluid levels or advanced mucosal thickening. No mastoid effusion. Normal orbits. CTA NECK FINDINGS AORTIC ARCH: There is no calcific atherosclerosis of the aortic arch. There is no aneurysm, dissection or hemodynamically significant stenosis of the visualized ascending aorta and aortic arch. Conventional 3 vessel aortic branching pattern. The visualized proximal subclavian arteries are widely patent. RIGHT CAROTID SYSTEM: --Common carotid artery: Widely patent origin without common carotid artery dissection or aneurysm. --Internal carotid artery: No dissection, occlusion or aneurysm. No hemodynamically significant stenosis.  --External carotid artery: No acute abnormality. LEFT CAROTID SYSTEM: --Common carotid artery: Widely patent origin without common carotid artery dissection or aneurysm. --Internal carotid artery:No dissection, occlusion or aneurysm. No hemodynamically significant stenosis. --External carotid artery: No acute abnormality. VERTEBRAL ARTERIES: Left dominant configuration. Both origins are normal. Right vertebral artery terminates in PICA. There is predominantly noncalcified plaque in the V4 segment of the left vertebral artery. SKELETON: There is no bony spinal canal stenosis. No lytic or blastic lesion. OTHER NECK: Fluid within the distal cervical and proximal thoracic esophagus. UPPER CHEST: No pneumothorax or pleural effusion. No nodules or masses. CTA HEAD FINDINGS ANTERIOR CIRCULATION: --Intracranial internal carotid arteries: Normal. --Anterior cerebral arteries:  Normal. Both A1 segments are present. Patent anterior communicating artery. --Middle cerebral arteries: Normal. --Posterior communicating arteries: Absent bilaterally. POSTERIOR CIRCULATION: --Basilar artery: Normal. --Posterior cerebral arteries: Normal. --Superior cerebellar arteries: Normal. --Inferior cerebellar arteries: Normal variant pattern with the right vertebral artery terminating in PICA. VENOUS SINUSES: As permitted by contrast timing, patent. ANATOMIC VARIANTS: None DELAYED PHASE: No parenchymal contrast enhancement. Review of the MIP images confirms the above findings. IMPRESSION: 1. No emergent large vessel occlusion or flow-limiting stenosis. 2. Focal noncalcified atherosclerotic plaque in the V4 segment of the left vertebral artery causing slightly less than 50% stenosis. 3. Multiple old infarcts and extensive cerebral volume loss. Electronically Signed   By: Deatra Robinson M.D.   On: 05/13/2017 20:03   Ct Angio Neck W Or Wo Contrast  Result Date: 05/13/2017 CLINICAL DATA:  Stroke follow-up EXAM: CT ANGIOGRAPHY HEAD AND NECK  TECHNIQUE: Multidetector CT imaging of the head and neck was performed using the standard protocol during bolus administration of intravenous contrast. Multiplanar CT image reconstructions and MIPs were obtained to evaluate the vascular anatomy. Carotid stenosis measurements (when applicable) are obtained utilizing NASCET criteria, using the distal internal carotid diameter as the denominator. CONTRAST:  50mL ISOVUE-370 IOPAMIDOL (ISOVUE-370) INJECTION 76% COMPARISON:  Head CT 05/11/2017 FINDINGS: CT HEAD FINDINGS BRAIN: No mass lesion, intraparenchymal hemorrhage or extra-axial collection. No evidence of acute cortical infarct. Extensive chronic ischemic white matter disease and encephalomalacia throughout much of the left hemisphere. Wallerian degeneration at the left cerebral peduncle. VASCULAR: No hyperdense vessel or unexpected vascular calcification. SKULL: Normal visualized skull base, calvarium and extracranial soft tissues. SINUSES/ORBITS: No sinus fluid levels or advanced mucosal thickening. No mastoid effusion. Normal orbits. CTA NECK FINDINGS AORTIC ARCH: There is no calcific atherosclerosis of the aortic arch. There is no aneurysm, dissection or hemodynamically significant stenosis of the visualized ascending aorta and aortic arch. Conventional 3 vessel aortic branching pattern. The visualized proximal subclavian arteries are widely patent. RIGHT CAROTID SYSTEM: --Common carotid artery: Widely patent origin without common carotid artery dissection or aneurysm. --Internal carotid artery: No dissection, occlusion or aneurysm. No hemodynamically significant stenosis. --External carotid artery: No acute abnormality. LEFT CAROTID SYSTEM: --Common carotid artery: Widely patent origin without common carotid artery dissection or aneurysm. --Internal carotid artery:No dissection, occlusion or aneurysm. No hemodynamically significant stenosis. --External carotid artery: No acute abnormality. VERTEBRAL ARTERIES:  Left dominant configuration. Both origins are normal. Right vertebral artery terminates in PICA. There is predominantly noncalcified plaque in the V4 segment of the left vertebral artery. SKELETON: There is no bony spinal canal stenosis. No lytic or blastic lesion. OTHER NECK: Fluid within the distal cervical and proximal thoracic esophagus. UPPER CHEST: No pneumothorax or pleural effusion. No nodules or masses. CTA HEAD FINDINGS ANTERIOR CIRCULATION: --Intracranial internal carotid arteries: Normal. --Anterior cerebral arteries: Normal. Both A1 segments are present. Patent anterior communicating artery. --Middle cerebral arteries: Normal. --Posterior communicating arteries: Absent bilaterally. POSTERIOR CIRCULATION: --Basilar artery: Normal. --Posterior cerebral arteries: Normal. --Superior cerebellar arteries: Normal. --Inferior cerebellar arteries: Normal variant pattern with the right vertebral artery terminating in PICA. VENOUS SINUSES: As permitted by contrast timing, patent. ANATOMIC VARIANTS: None DELAYED PHASE: No parenchymal contrast enhancement. Review of the MIP images confirms the above findings. IMPRESSION: 1. No emergent large vessel occlusion or flow-limiting stenosis. 2. Focal noncalcified atherosclerotic plaque in the V4 segment of the left vertebral artery causing slightly less than 50% stenosis. 3. Multiple old infarcts and extensive cerebral volume loss. Electronically Signed   By: Chrisandra Netters.D.  On: 05/13/2017 20:03   Ct Chest W Contrast  Result Date: 05/11/2017 CLINICAL DATA:  TIA, hypotension, femur fracture, acute generalized abdominal pain, history hypertension, dementia, asthma, seizures EXAM: CT CHEST, ABDOMEN, AND PELVIS WITH CONTRAST TECHNIQUE: Multidetector CT imaging of the chest, abdomen and pelvis was performed following the standard protocol during bolus administration of intravenous contrast. Sagittal and coronal MPR images reconstructed from axial data set. CONTRAST:   ISOVUE-370 IOPAMIDOL (ISOVUE-370) INJECTION 76% IV. No oral contrast administered. COMPARISON:  None FINDINGS: CT CHEST FINDINGS Cardiovascular: Aneurysmal dilatation of the ascending thoracic aorta 4.1 cm diameter image 35. Additional dilatation of the distal aortic arch 4.1 cm diameter. No evidence of aortic dissection. Pulmonary arteries grossly patent on non targeted exam. No pericardial effusion. Mild dilatation of the RIGHT ventricle. Atherosclerotic calcifications of the coronary arteries and descending thoracic aorta. Mediastinum/Nodes: Esophagus unremarkable. Base of cervical region normal appearance. No thoracic adenopathy. Lungs/Pleura: Lungs emphysematous with scattered atelectasis in medial RIGHT lower lobe and at both lung bases. No pulmonary infiltrate, pleural effusion or pneumothorax. Musculoskeletal: Osseous demineralization. Advanced degenerative changes of BILATERAL glenohumeral joints. Sclerotic lesion within proximal LEFT humerus, question enchondroma versus bone infarct. CT ABDOMEN PELVIS FINDINGS Hepatobiliary: Gallbladder and liver normal appearance Pancreas: Normal appearance Spleen: Normal appearance Adrenals/Urinary Tract: Adrenal glands, kidneys, ureters, and bladder normal appearance Stomach/Bowel: Normal appendix in RIGHT pelvis. Stomach and bowel loops grossly unremarkable within limitations of patient motion and scattered streak artifacts. Vascular/Lymphatic: Atherosclerotic calcification and tortuosity of abdominal aorta. Aorta normal caliber. Short segment of dissection identified at dilated RIGHT common iliac artery, 19 mm diameter. 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 No adenopathy. Reproductive: Mild prostatic enlargement. Seminal vesicles unremarkable. Other: Small BILATERAL inguinal hernias containing fat. No free air or free fluid. No definite acute inflammatory process. Musculoskeletal: Diffuse  osseous demineralization. Scattered degenerative disc disease changes of the thoracolumbar spine most severe at L5-S1 with mild retrolisthesis. Comminuted subtrochanteric fracture of the proximal LEFT femur extending into the lesser trochanter. Surrounding soft tissue edema/hemorrhage advanced degenerative changes of the RIGHT hip joint. IMPRESSION: No acute intra-abdominal or intrapelvic abnormalities. Displaced comminuted proximal LEFT femoral fracture extending into lesser trochanter. Degenerative changes of the thoracolumbar spine. Advanced degenerative changes of both shoulders and RIGHT hip joint. COPD changes with scattered subsegmental atelectasis. Scattered atherosclerotic disease including aneurysmal dilatation of ascending thoracic aorta, distal aortic arch, and BILATERAL internal iliac arteries, recommendation below. 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 Recommend semi-annual imaging followup by CTA or MRA and referral to cardiothoracic surgery if not already obtained. This recommendation follows 2010 ACCF/AHA/AATS/ACR/ASA/SCA/SCAI/SIR/STS/SVM Guidelines for the Diagnosis and Management of Patients With Thoracic Aortic Disease. Circulation. 2010; 121: B147-W29 Electronically Signed   By: Ulyses Southward M.D.   On: 05/11/2017 12:48   Ct Abdomen Pelvis W Contrast  Result Date: 05/11/2017 CLINICAL DATA:  TIA, hypotension, femur fracture, acute generalized abdominal pain, history hypertension, dementia, asthma, seizures EXAM: CT CHEST, ABDOMEN, AND PELVIS WITH CONTRAST TECHNIQUE: Multidetector CT imaging of the chest, abdomen and pelvis was performed following the standard protocol during bolus administration of intravenous contrast. Sagittal and coronal MPR images reconstructed from axial data set. CONTRAST:  ISOVUE-370 IOPAMIDOL (ISOVUE-370) INJECTION 76% IV. No oral contrast administered. COMPARISON:  None FINDINGS: CT CHEST FINDINGS Cardiovascular: Aneurysmal  dilatation of the ascending thoracic aorta 4.1 cm diameter image 35. Additional dilatation of the distal aortic arch 4.1 cm diameter. No evidence of aortic dissection.  Pulmonary arteries grossly patent on non targeted exam. No pericardial effusion. Mild dilatation of the RIGHT ventricle. Atherosclerotic calcifications of the coronary arteries and descending thoracic aorta. Mediastinum/Nodes: Esophagus unremarkable. Base of cervical region normal appearance. No thoracic adenopathy. Lungs/Pleura: Lungs emphysematous with scattered atelectasis in medial RIGHT lower lobe and at both lung bases. No pulmonary infiltrate, pleural effusion or pneumothorax. Musculoskeletal: Osseous demineralization. Advanced degenerative changes of BILATERAL glenohumeral joints. Sclerotic lesion within proximal LEFT humerus, question enchondroma versus bone infarct. CT ABDOMEN PELVIS FINDINGS Hepatobiliary: Gallbladder and liver normal appearance Pancreas: Normal appearance Spleen: Normal appearance Adrenals/Urinary Tract: Adrenal glands, kidneys, ureters, and bladder normal appearance Stomach/Bowel: Normal appendix in RIGHT pelvis. Stomach and bowel loops grossly unremarkable within limitations of patient motion and scattered streak artifacts. Vascular/Lymphatic: Atherosclerotic calcification and tortuosity of abdominal aorta. Aorta normal caliber. Short segment of dissection identified at dilated RIGHT common iliac artery, 19 mm diameter. 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 No adenopathy. Reproductive: Mild prostatic enlargement. Seminal vesicles unremarkable. Other: Small BILATERAL inguinal hernias containing fat. No free air or free fluid. No definite acute inflammatory process. Musculoskeletal: Diffuse osseous demineralization. Scattered degenerative disc disease changes of the thoracolumbar spine most severe at L5-S1 with mild retrolisthesis. Comminuted  subtrochanteric fracture of the proximal LEFT femur extending into the lesser trochanter. Surrounding soft tissue edema/hemorrhage advanced degenerative changes of the RIGHT hip joint. IMPRESSION: No acute intra-abdominal or intrapelvic abnormalities. Displaced comminuted proximal LEFT femoral fracture extending into lesser trochanter. Degenerative changes of the thoracolumbar spine. Advanced degenerative changes of both shoulders and RIGHT hip joint. COPD changes with scattered subsegmental atelectasis. Scattered atherosclerotic disease including aneurysmal dilatation of ascending thoracic aorta, distal aortic arch, and BILATERAL internal iliac arteries, recommendation below. 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 Recommend semi-annual imaging followup by CTA or MRA and referral to cardiothoracic surgery if not already obtained. This recommendation follows 2010 ACCF/AHA/AATS/ACR/ASA/SCA/SCAI/SIR/STS/SVM Guidelines for the Diagnosis and Management of Patients With Thoracic Aortic Disease. Circulation. 2010; 121: X528-U13 Electronically Signed   By: Ulyses Southward M.D.   On: 05/11/2017 12:48   Dg C-arm 1-60 Min  Result Date: 05/13/2017 CLINICAL DATA:  Left femur fracture ORIF. EXAM: DG C-ARM 61-120 MIN; LEFT FEMUR 2 VIEWS COMPARISON:  Left femur x-rays dated May 11, 2017. FINDINGS: Multiple intraoperative x-rays demonstrate interval cephalomedullary rod fixation of the left proximal femur fracture. Alignment is near anatomic. No hardware complication. Unchanged avulsion fracture of the lesser trochanter. IMPRESSION: 1. Left proximal femur fracture ORIF.  Alignment is near anatomic. 2. Unchanged lesser trochanteric avulsion fracture. FLUOROSCOPY TIME:  99 seconds. C-arm fluoroscopic images were obtained intraoperatively and submitted for post operative interpretation. Electronically Signed   By: Obie Dredge M.D.   On: 05/13/2017 13:14   Dg C-arm 1-60 Min  Result Date:  05/13/2017 CLINICAL DATA:  Left femur fracture ORIF. EXAM: DG C-ARM 61-120 MIN; LEFT FEMUR 2 VIEWS COMPARISON:  Left femur x-rays dated May 11, 2017. FINDINGS: Multiple intraoperative x-rays demonstrate interval cephalomedullary rod fixation of the left proximal femur fracture. Alignment is near anatomic. No hardware complication. Unchanged avulsion fracture of the lesser trochanter. IMPRESSION: 1. Left proximal femur fracture ORIF.  Alignment is near anatomic. 2. Unchanged lesser trochanteric avulsion fracture. FLUOROSCOPY TIME:  99 seconds. C-arm fluoroscopic images were obtained intraoperatively and submitted for post operative interpretation. Electronically Signed   By: Obie Dredge M.D.   On: 05/13/2017 13:14   Dg Femur Port 1v Left  Result Date: 05/11/2017 CLINICAL DATA:  Known fracture, following traction EXAM: LEFT FEMUR PORTABLE 1 VIEW COMPARISON:  Study obtained earlier in the day FINDINGS: Frontal view obtained. Comminuted fracture of the proximal left femoral diaphysis noted without appreciable change. There is avulsion of the lesser trochanter. There is medial displacement of the distal fracture fragment with respect to proximal fragment with 8.7 cm of overriding of fracture fragments. No distal fracture. Advanced arthropathy noted in the left knee joint region. Mild narrowing right hip joint. IMPRESSION: Persistent comminuted fracture proximal femur with a 0.7 cm of overriding of fracture fragments. Avulsion of the lesser trochanter. Alignment similar to pre traction images. Advanced arthropathy in the knee joint. No dislocation evident. Electronically Signed   By: Bretta Bang III M.D.   On: 05/11/2017 15:27   Dg Femur Min 2 Views Left  Result Date: 05/13/2017 CLINICAL DATA:  83 y/o  M; postop left femur. EXAM: LEFT FEMUR 2 VIEWS COMPARISON:  05/11/2017 left proximal femur radiograph. FINDINGS: Left proximal femoral diaphysis acute fracture post nail fixation with near anatomic  alignment. Medially displaced fracture of lesser trochanter. No hip dislocation. No new fracture identified. 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 Mild edema and air within the surrounding soft tissues. IMPRESSION: 1. Left proximal femoral diaphysis acute fracture post nail fixation with near anatomic alignment. Expected postsurgical changes in soft tissues. 2. Mild left hip and severe left knee osteoarthrosis. Electronically Signed   By: Mitzi Hansen M.D.   On: 05/13/2017 20:56   Dg Femur Min 2 Views Left  Result Date: 05/13/2017 CLINICAL DATA:  Left femur fracture ORIF. EXAM: DG C-ARM 61-120 MIN; LEFT FEMUR 2 VIEWS COMPARISON:  Left femur x-rays dated May 11, 2017. FINDINGS: Multiple intraoperative x-rays demonstrate interval cephalomedullary rod fixation of the left proximal femur fracture. Alignment is near anatomic. No hardware complication. Unchanged avulsion fracture of the lesser trochanter. IMPRESSION: 1. Left proximal femur fracture ORIF.  Alignment is near anatomic. 2. Unchanged lesser trochanteric avulsion fracture. FLUOROSCOPY TIME:  99 seconds. C-arm fluoroscopic images were obtained intraoperatively and submitted for post operative interpretation. Electronically Signed   By: Obie Dredge M.D.   On: 05/13/2017 13:14   Dg Femur Port Min 2 Views Left  Result Date: 05/11/2017 CLINICAL DATA:  Larey Seat today poor historian, history hypertension, asthma, stroke, dementia, former smoker EXAM: LEFT FEMUR PORTABLE 2 VIEWS COMPARISON:  None FINDINGS: Osseous demineralization. Displaced oblique fracture of the proximal LEFT femoral diaphysis extending to involve the lesser trochanter. LEFT femoral head, neck and greater trochanter appear intact. LEFT hip joint space preserved without dislocation. Advanced tricompartmental osteoarthritic changes of the LEFT knee with joint space narrowing and spur formation. Calcified debris  posteriorly at the LEFT knee joint. Distal LEFT femur intact. IMPRESSION: Displaced oblique fracture of proximal LEFT femoral diaphysis extending into lesser trochanter. Osseous demineralization with advanced degenerative changes of the LEFT knee joint. Electronically Signed   By: Ulyses Southward M.D.   On: 05/11/2017 13:33   Ct Head Code Stroke Wo Contrast  Result Date: 05/11/2017 CLINICAL DATA:  Code stroke. 83 year old male with right side weakness. EXAM: CT HEAD WITHOUT CONTRAST TECHNIQUE: Contiguous axial images were obtained from the base of the skull through the vertex without intravenous contrast. COMPARISON:  Head CTs 08/21/2016 and earlier. FINDINGS: Brain: Chronic left MCA territory encephalomalacia, confluent left hemisphere white matter hypodensity, and ex vacuo enlargement of the left lateral ventricle appears stable since July 2018. There is associated  confluent hypodensity in the left cauda thalamic groove and posterior limb left internal capsule with evidence of Wallerian degeneration at the left midbrain and brainstem. Moderate contralateral right hemisphere white matter hypodensity also appears stable. No midline shift, ventriculomegaly, mass effect, evidence of mass lesion, intracranial hemorrhage or evidence of cortically based acute infarction. No other cortical encephalomalacia identified. Vascular: Calcified atherosclerosis at the skull base. Intracranial artery tortuosity. No suspicious intracranial vascular hyperdensity. Skull: No acute osseous abnormality identified. Partially visible advanced upper cervical spine degeneration. Sinuses/Orbits: Visualized paranasal sinuses and mastoids are stable and well pneumatized. Other: Visualized orbits and scalp soft tissues are within normal limits. ASPECTS [facility]([place] Stroke Program Early CT Score) Total score (0-10 with 10 being normal): 10 (chronic left hemisphere encephalomalacia). IMPRESSION: 1. No acute intracranial abnormality. Pronounced  chronic encephalomalacia in the left hemisphere with left deep gray matter and brainstem Wallerian degeneration appears stable since 2018. 2. No acute intracranial hemorrhage. ASPECTS is 10 (chronic left hemisphere encephalomalacia). 3. Study discussed by telephone with Dr. Caryl PinaEric Lindzen on 05/11/2017 at 12:01 . Electronically Signed   By: Odessa FlemingH  Hall M.D.   On: 05/11/2017 12:02      Subjective: Patient denies complaints.  No pain reported.  Seen sitting up in bed eating breakfast.  As per RN, no acute issues reported.  Discharge Exam:  Vitals:   05/15/17 2138 05/16/17 0153 05/16/17 0516 05/16/17 1413  BP: (!) 144/89 114/75 115/82 110/76  Pulse: (!) 103 85 78 90  Resp: 16 16 20 18   Temp:  98 F (36.7 C) (!) 97.5 F (36.4 C) 98.7 F (37.1 C)  TempSrc:  Oral Oral Oral  SpO2: 98% 98% 99% 100%  Weight:      Height:        General exam: Pleasant elderly male sitting up comfortably in bed and eating breakfast this morning. Respiratory system: Clear to auscultation. Respiratory effort normal.   Cardiovascular system: S1 and S2 heard, RRR.  No JVD, murmurs or pedal edema. Gastrointestinal system: Abdomen is nondistended, soft and nontender. No organomegaly or masses felt. Normal bowel sounds heard.   Central nervous system: Alert and oriented x2. No focal neurological deficits.  Extremities: Left lower extremity power assessment limited secondary to postop pain.  Grade 5 x 5 power in left upper extremity.  Grade 4 x 5 power in right limbs.  Left hip postop dressing clean and dry. Skin: No rashes, lesions or ulcers Psychiatry: Judgement and insight impaired. Mood & affect appropriate.      The results of significant diagnostics from this hospitalization (including imaging, microbiology, ancillary and laboratory) are listed below for reference.     Microbiology: Recent Results (from the past 240 hour(s))  Surgical pcr screen     Status: None   Collection Time: 05/11/17  9:56 PM  Result  Value Ref Range Status   MRSA, PCR NEGATIVE NEGATIVE Final   Staphylococcus aureus NEGATIVE NEGATIVE Final    Comment: (NOTE) The Xpert SA Assay (FDA approved for NASAL specimens in patients 81 years of age and older), is one component of a comprehensive surveillance program. It is not intended to diagnose infection nor to guide or monitor treatment. Performed at [facility], [address] [place], [place] [address]      Labs: CBC: Recent Labs  Lab 05/11/17 1135  05/12/17 0429 05/13/17 0922 05/14/17 0338 05/15/17 0421 05/16/17 0734  WBC 12.6*  --  9.1 9.3 8.1 10.3 11.9*  NEUTROABS 10.9*  --   --   --  7.1  --   --   HGB 11.7*   < > 10.3* 8.9* 7.5* 9.3* 9.3*  HCT 34.8*   < > 31.6* 28.2* 22.9* 28.0* 29.1*  MCV 80.6  --  82.5 83.7 83.6 82.8 84.1  PLT 203  --  170 148* 137* 118* 166   < > = values in this interval not displayed.   Basic Metabolic Panel: Recent Labs  Lab 05/11/17 1135 05/11/17 1146 05/12/17 0429 05/13/17 0358 05/15/17 0421 05/16/17 0734  NA 136 137 140 139 136  --   K 5.1 5.2* 4.7 4.8 4.1  --   CL 103 104 105 107 101  --   CO2 21*  --  24 24 28   --   GLUCOSE 165* 174* 108* 109* 98  --   BUN 17 20 21* 19 14  --   CREATININE 1.48* 1.30* 1.36* 0.91 0.83  --   CALCIUM 9.4  --  8.8* 8.5* 8.2*  8.2*  --   MG  --   --   --   --  1.9 1.9  PHOS  --   --   --   --  2.2* 2.9   Liver Function Tests: Recent Labs  Lab 05/11/17 1135 05/12/17 0429 05/15/17 0421 05/16/17 0734  AST 45* 99* 64* 60*  ALT 25 32 30 32  ALKPHOS 59 48 37* 41  BILITOT 0.6 0.8 0.9 0.9  PROT 7.5 6.6 5.8* 5.5*  ALBUMIN 3.4* 3.0* 2.5* 2.2*   Cardiac Enzymes: Recent Labs  Lab 05/11/17 2221 05/12/17 0429 05/12/17 1032  TROPONINI <0.03 <0.03 <0.03   CBG: Recent Labs  Lab 05/11/17 1136  GLUCAP 165*   Thyroid function studies Recent Labs    05/15/17 0421  TSH 1.845   Urinalysis    Component Value Date/Time   COLORURINE YELLOW 05/11/2017 1755   APPEARANCEUR  HAZY (A) 05/11/2017 1755   LABSPEC >1.046 (H) 05/11/2017 1755   PHURINE 5.0 05/11/2017 1755   GLUCOSEU NEGATIVE 05/11/2017 1755   HGBUR LARGE (A) 05/11/2017 1755   BILIRUBINUR NEGATIVE 05/11/2017 1755   KETONESUR NEGATIVE 05/11/2017 1755   PROTEINUR 30 (A) 05/11/2017 1755   UROBILINOGEN 1.0 02/01/2014 1507   NITRITE NEGATIVE 05/11/2017 1755   LEUKOCYTESUR NEGATIVE 05/11/2017 1755   Discussed in detail with patient's daughter.  Updated care and answered questions.   Time coordinating discharge: 45 minutes  SIGNED:  Marcellus Scott, MD, FACP, [facility]. [facility] Pager [phone]  If 7PM-7AM, please contact night-coverage [email or web address] Password [facility] 05/16/2017, 6:45 PM

## 2017-05-16 NOTE — [place] FL2 (Signed)
[place] MEDICAID FL2 LEVEL OF CARE SCREENING TOOL     IDENTIFICATION  Patient Name: Johnathan BeardsJames C Alter Birthdate: May 13, 1934 Sex: male Admission Date (Current Location): 05/11/2017  [facility] [place]   Facility and Address:  [facility] [place], [address], [place], [place] [address]      Provider Number: [id number]  Attending Physician Name and Address:  Elease EtienneHongalgi, Anand D, MD  Relative Name and Phone Number:       Current Level of Care: Hospital Recommended Level of Care: Skilled Nursing Facility Prior Approval Number:    Date Approved/Denied:   PASRR Number: [id number]  Discharge Plan: SNF    Current Diagnoses: Patient Active Problem List   Diagnosis Date Noted  . AKI (acute kidney injury) (HCC) 05/12/2017  . Stroke (HCC) 05/11/2017  . Femur fracture (HCC) 05/11/2017  . Chest pain 02/09/2016  . HTN (hypertension) 02/09/2016  . Dementia with behavioral disturbance 02/09/2016  . Chest pain at rest 02/09/2016  . Cerebral infarction (HCC) 02/01/2014  . Benign essential HTN 02/01/2014    Orientation RESPIRATION BLADDER Height & Weight     Self, Time, Place, Situation  Normal Continent Weight: 150 lb (68 kg) Height:  5\' 6"  (167.6 cm)  BEHAVIORAL SYMPTOMS/MOOD NEUROLOGICAL BOWEL NUTRITION STATUS      Continent Diet(heart healthy)  AMBULATORY STATUS COMMUNICATION OF NEEDS Skin   Limited Assist(wheel chair at baseline) Verbally                         Personal Care Assistance Level of Assistance  Bathing, Feeding, Dressing Bathing Assistance: Limited assistance Feeding assistance: Independent Dressing Assistance: Limited assistance     Functional Limitations Info  Sight, Hearing, Speech Sight Info: Adequate Hearing Info: Adequate Speech Info: Adequate    SPECIAL CARE FACTORS FREQUENCY  PT (By licensed PT), OT (By licensed OT)     PT Frequency: 5x wk OT Frequency: 5x wk            Contractures  Contractures Info: Not present    Additional Factors Info  Code Status, Allergies Code Status Info: Full Code Allergies Info: PENICILLINS            Current Medications (05/16/2017):  This is the current hospital active medication list Current Facility-Administered Medications  Medication Dose Route Frequency Provider Last Rate Last Dose  .  stroke: mapping our early stages of recovery book   Does not apply Once Montez Moritaaul, Keith, PA-C      . acetaminophen (TYLENOL) tablet 650 mg  650 mg Oral Q4H PRN Montez MoritaPaul, Keith, PA-C       Or  . acetaminophen (TYLENOL) solution 650 mg  650 mg Per Tube Q4H PRN Montez MoritaPaul, Keith, PA-C       Or  . acetaminophen (TYLENOL) suppository 650 mg  650 mg Rectal Q4H PRN Montez MoritaPaul, Keith, PA-C      . acetaminophen (TYLENOL) tablet 650 mg  650 mg Oral Q6H Montez Moritaaul, Keith, PA-C   650 mg at 05/16/17 1000  . aspirin suppository 300 mg  300 mg Rectal Daily Montez MoritaPaul, Keith, PA-C       Or  . aspirin tablet 325 mg  325 mg Oral Daily Montez Moritaaul, Keith, PA-C   325 mg at 05/16/17 1100  . atorvastatin (LIPITOR) tablet 80 mg  80 mg Oral q1800 Montez Moritaaul, Keith, PA-C   80 mg at 05/15/17 1723  . calcium citrate (CALCITRATE - dosed in mg elemental calcium) tablet 200 mg  of elemental calcium  200 mg of elemental calcium Oral BID [name], PA-C      . cholecalciferol (VITAMIN D) tablet 2,000 Units  2,000 Units Oral Daily [name], PA-C   2,000 Units at 05/16/17 1100  . divalproex (DEPAKOTE ER) 24 hr tablet 500 mg  500 mg Oral QHS [name], PA-C   500 mg at 05/15/17 2303  . docusate sodium (COLACE) capsule 100 mg  100 mg Oral BID [name], PA-C   100 mg at 05/15/17 2302  . escitalopram (LEXAPRO) tablet 10 mg  10 mg Oral Daily [name], PA-C   10 mg at 05/16/17 1100  . metoCLOPramide (REGLAN) tablet 5-10 mg  5-10 mg Oral Q8H PRN [name], PA-C       Or  . metoCLOPramide (REGLAN) injection 5-10 mg  5-10 mg Intravenous Q8H PRN [name], PA-C      . morphine 2 MG/ML injection 2 mg  2 mg Intravenous  Q4H PRN [name], PA-C   2 mg at 05/15/17 1241  . ondansetron (ZOFRAN) tablet 4 mg  4 mg Oral Q6H PRN [name], PA-C       Or  . ondansetron [facility]) injection 4 mg  4 mg Intravenous Q6H PRN [name], PA-C      . oxyCODONE (Oxy IR/ROXICODONE) immediate release tablet 2.5-5 mg  2.5-5 mg Oral Q6H PRN [name], PA-C   5 mg at 05/15/17 0900  . vitamin C (ASCORBIC ACID) tablet 500 mg  500 mg Oral Daily [name], PA-C   500 mg at 05/16/17 1100     Discharge Medications: Please see discharge summary for a list of discharge medications.  Relevant Imaging Results:  Relevant Lab Results:   Additional Information SS# [id number]  Althea Charon, LCSW

## 2017-05-16 NOTE — Discharge Instructions (Signed)
Orthopaedic Trauma Service Discharge Instructions   General Discharge Instructions  WEIGHT BEARING STATUS: weightbearing as tolerated Left leg   RANGE OF MOTION/ACTIVITY: unrestricted range of motion L hip   Wound Care: daily wound care as needed. Ok to leave incisions on left leg open to air. Clean only with soap and water. Ok to shower. See below  Discharge Wound Care Instructions  Do NOT apply any ointments, solutions or lotions to pin sites or surgical wounds.  These prevent needed drainage and even though solutions like hydrogen peroxide kill bacteria, they also damage cells lining the pin sites that help fight infection.  Applying lotions or ointments can keep the wounds moist and can cause them to breakdown and open up as well. This can increase the risk for infection. When in doubt call the office.  Surgical incisions should be dressed daily.  If any drainage is noted, use one layer of adaptic, then gauze, Kerlix, and an ace wrap.  Once the incision is completely dry and without drainage, it may be left open to air out.  Showering may begin 36-48 hours later.  Cleaning gently with soap and water.  Traumatic wounds should be dressed daily as well.    One layer of adaptic, gauze, Kerlix, then ace wrap.  The adaptic can be discontinued once the draining has ceased    If you have a wet to dry dressing: wet the gauze with saline the squeeze as much saline out so the gauze is moist (not soaking wet), place moistened gauze over wound, then place a dry gauze over the moist one, followed by Kerlix wrap, then ace wrap.  Diet: as you were eating previously.  Can use over the counter stool softeners and bowel preparations, such as Miralax, to help with bowel movements.  Narcotics can be constipating.  Be sure to drink plenty of fluids  PAIN MEDICATION USE AND EXPECTATIONS  You have likely been given narcotic medications to help control your pain.  After a traumatic event that results in an  fracture (broken bone) with or without surgery, it is ok to use narcotic pain medications to help control one's pain.  We understand that everyone responds to pain differently and each individual patient will be evaluated on a regular basis for the continued need for narcotic medications. Ideally, narcotic medication use should last no more than 6-8 weeks (coinciding with fracture healing).   As a patient it is your responsibility as well to monitor narcotic medication use and report the amount and frequency you use these medications when you come to your office visit.   We would also advise that if you are using narcotic medications, you should take a dose prior to therapy to maximize you participation.  IF YOU ARE ON NARCOTIC MEDICATIONS IT IS NOT PERMISSIBLE TO OPERATE A MOTOR VEHICLE (MOTORCYCLE/CAR/TRUCK/MOPED) OR HEAVY MACHINERY DO NOT MIX NARCOTICS WITH OTHER CNS (CENTRAL NERVOUS SYSTEM) DEPRESSANTS SUCH AS ALCOHOL   STOP SMOKING OR USING NICOTINE PRODUCTS!!!!  As discussed nicotine severely impairs your body's ability to heal surgical and traumatic wounds but also impairs bone healing.  Wounds and bone heal by forming microscopic blood vessels (angiogenesis) and nicotine is a vasoconstrictor (essentially, shrinks blood vessels).  Therefore, if vasoconstriction occurs to these microscopic blood vessels they essentially disappear and are unable to deliver necessary nutrients to the healing tissue.  This is one modifiable factor that you can do to dramatically increase your chances of healing your injury.    (This means no smoking,  no nicotine gum, patches, etc)  DO NOT USE NONSTEROIDAL ANTI-INFLAMMATORY DRUGS (NSAID'S)  Using products such as Advil (ibuprofen), Aleve (naproxen), Motrin (ibuprofen) for additional pain control during fracture healing can delay and/or prevent the healing response.  If you would like to take over the counter (OTC) medication, Tylenol (acetaminophen) is ok.  However,  some narcotic medications that are given for pain control contain acetaminophen as well. Therefore, you should not exceed more than 4000 mg of tylenol in a day if you do not have liver disease.  Also note that there are may OTC medicines, such as cold medicines and allergy medicines that my contain tylenol as well.  If you have any questions about medications and/or interactions please ask your doctor/PA or your pharmacist.      ICE AND ELEVATE INJURED/OPERATIVE EXTREMITY  Using ice and elevating the injured extremity above your heart can help with swelling and pain control.  Icing in a pulsatile fashion, such as 20 minutes on and 20 minutes off, can be followed.    Do not place ice directly on skin. Make sure there is a barrier between to skin and the ice pack.    Using frozen items such as frozen peas works well as the conform nicely to the are that needs to be iced.  USE AN ACE WRAP OR TED HOSE FOR SWELLING CONTROL  In addition to icing and elevation, Ace wraps or TED hose are used to help limit and resolve swelling.  It is recommended to use Ace wraps or TED hose until you are informed to stop.    When using Ace Wraps start the wrapping distally (farthest away from the body) and wrap proximally (closer to the body)   Example: If you had surgery on your leg or thing and you do not have a splint on, start the ace wrap at the toes and work your way up to the thigh        If you had surgery on your upper extremity and do not have a splint on, start the ace wrap at your fingers and work your way up to the upper arm  IF YOU ARE IN A SPLINT OR CAST DO NOT REMOVE IT FOR ANY REASON   If your splint gets wet for any reason please contact the office immediately. You may shower in your splint or cast as long as you keep it dry.  This can be done by wrapping in a cast cover or garbage back (or similar)  Do Not stick any thing down your splint or cast such as pencils, money, or hangers to try and scratch  yourself with.  If you feel itchy take benadryl as prescribed on the bottle for itching  IF YOU ARE IN A CAM BOOT (BLACK BOOT)  You may remove boot periodically. Perform daily dressing changes as noted below.  Wash the liner of the boot regularly and wear a sock when wearing the boot. It is recommended that you sleep in the boot until told otherwise  CALL THE OFFICE WITH ANY QUESTIONS OR CONCERNS: [phone]

## 2017-05-16 NOTE — Progress Notes (Signed)
Initial Nutrition Assessment  DOCUMENTATION CODES:   Not applicable  INTERVENTION:   Ensure Enlive po BID, each supplement provides 350 kcal and 20 grams of protein  NUTRITION DIAGNOSIS:   Increased nutrient needs related to post-op healing as evidenced by estimated needs.  GOAL:   Patient will meet greater than or equal to 90% of their needs  MONITOR:   PO intake, Supplement acceptance, Labs, I & O's, Weight trends  REASON FOR ASSESSMENT:   Consult Assessment of nutrition requirement/status, Hip fracture protocol  ASSESSMENT:   Pt with PMH of dementia, HTN, CVA, asthma, and seizures presents with stroke symptoms and L traumatic femur fracture s/p IM nailing 04/16.    Pt reports a good appetite but difficulty chewing. Pt has lost all of his top teeth except one, therefore has been choosing soft textured foods. Meal completion records indicate pt consuming average of 85% of meals.   Pt unsure of weight changes and states his weight has been stable for the past year. No other recent weight changes available per chart.   Pt endorses constipation but no other nutrition impact symptoms.   Labs reviewed; Albumin 2.2, Prealbumin 12.2, Hemoglobin 9.3, HCT 29.1, Vitamin D 1,25 dihydroxy 151.0, Hemoglobin A1C 5.8 Medications reviewed; calcium citrate, vitamin D, Colace, vitamin C    NUTRITION - FOCUSED PHYSICAL EXAM:    Most Recent Value  Orbital Region  No depletion  Upper Arm Region  No depletion  Thoracic and Lumbar Region  No depletion  Buccal Region  No depletion  Temple Region  Mild depletion  Clavicle Bone Region  Mild depletion  Clavicle and Acromion Bone Region  Mild depletion  Scapular Bone Region  Mild depletion  Dorsal Hand  Mild depletion  Patellar Region  No depletion  Anterior Thigh Region  No depletion  Posterior Calf Region  No depletion  Edema (RD Assessment)  None     Suspect muscle depletions associated with age-related sarcopenia.   Diet Order:   Fall precautions Fall precautions Diet Heart Room service appropriate? Yes; Fluid consistency: Thin Diet - low sodium heart healthy  EDUCATION NEEDS:   No education needs have been identified at this time  Skin:  Skin Assessment: Skin Integrity Issues: Skin Integrity Issues:: Incisions Incisions: L leg  Last BM:  05/11/17  Height:   Ht Readings from Last 1 Encounters:  05/11/17 5\' 6"  (1.676 m)    Weight:   Wt Readings from Last 1 Encounters:  05/11/17 150 lb (68 kg)    Ideal Body Weight:  64.5 kg  BMI:  Body mass index is 24.21 kg/m.  Estimated Nutritional Needs:   Kcal:  1700-1900  Protein:  85-95 grams  Fluid:  >/= 1.7 L/d  Wylene SimmerAllison Hulin, MS, RDN, LDN 05/16/2017 2:15 PM

## 2017-05-16 NOTE — Progress Notes (Signed)
Orthopedic Trauma Service Progress Note   Patient ID: DANIELLE LENTO MRN: [id number] DOB/AGE: [birth year] 83 y.o.  Subjective:  Doing well  No complaints  Vitamin D levels look good  Other bone health labs look reasonable Think deficiencies are related to nutritional intake    ROS As above  Objective:   VITALS:   Vitals:   05/15/17 1501 05/15/17 2138 05/16/17 0153 05/16/17 0516  BP: 113/77 (!) 144/89 114/75 115/82  Pulse: 92 (!) 103 85 78  Resp: 18 16 16 20   Temp: 98.3 F (36.8 C)  98 F (36.7 C) (!) 97.5 F (36.4 C)  TempSrc: Oral  Oral Oral  SpO2: 97% 98% 98% 99%  Weight:      Height:        Estimated body mass index is 24.21 kg/m as calculated from the following:   Height as of this encounter: 5\' 6"  (1.676 m).   Weight as of this encounter: 68 kg (150 lb).   Intake/Output      04/18 0701 - 04/19 0700 04/19 0701 - 04/20 0700   P.O. 120    Blood     Total Intake(mL/kg) 120 (1.8)    Urine (mL/kg/hr) 500 (0.3) 600 (3.7)   Stool 0    Total Output 500 600   Net -380 -600        Stool Occurrence 1 x      LABS  Results for orders placed or performed during the hospital encounter of 05/11/17 (from the past 24 hour(s))  CBC     Status: Abnormal   Collection Time: 05/16/17  7:34 AM  Result Value Ref Range   WBC 11.9 (H) 4.0 - 10.5 K/uL   RBC 3.46 (L) 4.22 - 5.81 MIL/uL   Hemoglobin 9.3 (L) 13.0 - 17.0 g/dL   HCT 40.9 (L) 81.1 - 91.4 %   MCV 84.1 78.0 - 100.0 fL   MCH 26.9 26.0 - 34.0 pg   MCHC 32.0 30.0 - 36.0 g/dL   RDW 78.2 (H) 95.6 - 21.3 %   Platelets 166 150 - 400 K/uL  Phosphorus     Status: None   Collection Time: 05/16/17  7:34 AM  Result Value Ref Range   Phosphorus 2.9 2.5 - 4.6 mg/dL  Hepatic function panel     Status: Abnormal   Collection Time: 05/16/17  7:34 AM  Result Value Ref Range   Total Protein 5.5 (L) 6.5 - 8.1 g/dL   Albumin 2.2 (L) 3.5 - 5.0 g/dL   AST 60 (H) 15 - 41 U/L   ALT 32 17 - 63 U/L   Alkaline Phosphatase 41 38 - 126 U/L   Total Bilirubin 0.9 0.3 - 1.2 mg/dL   Bilirubin, Direct 0.2 0.1 - 0.5 mg/dL   Indirect Bilirubin 0.7 0.3 - 0.9 mg/dL  Magnesium     Status: None   Collection Time: 05/16/17  7:34 AM  Result Value Ref Range   Magnesium 1.9 1.7 - 2.4 mg/dL     PHYSICAL EXAM:   Gen: awake, comfortable appearing. Eating breakfast in bed, NAD Ext:       Left Lower Extremity              Incisions look fantastic                          No signs of infection  Scant bloody drainage                                       Distal motor and sensory functions intact             Ext warm              + DP pulse             Swelling controlled             Excellent knee ROM      Assessment/Plan: 3 Days Post-Op   Principal Problem:   Stroke [facility]) Active Problems:   Cerebral infarction (HCC)   Benign essential HTN   HTN (hypertension)   Dementia with behavioral disturbance   Femur fracture (HCC)   AKI (acute kidney injury) (HCC)   Anti-infectives (From admission, onward)   Start     Dose/Rate Route Frequency Ordered Stop   05/13/17 1700  clindamycin (CLEOCIN) IVPB 600 mg     600 mg 100 mL/hr over 30 Minutes Intravenous Every 6 hours 05/13/17 1408 05/14/17 0640   05/13/17 0930  clindamycin (CLEOCIN) IVPB 900 mg     900 mg 100 mL/hr over 30 Minutes Intravenous To ShortStay Surgical 05/13/17 0737 05/13/17 1100   05/13/17 0600  ceFAZolin (ANCEF) IVPB 2g/100 mL premix  Status:  Discontinued     2 g 200 mL/hr over 30 Minutes Intravenous On call to O.R. 05/12/17 2219 05/12/17 2244    .  POD/HD#: 31    83 y/o black male with complex medical history, Wheelchair dependent s/p fall with L subtrochanteric femur fracture   - fall, wheelchair dependent   -L subtrochanteric femur fracture s/p IMN             WBAT L leg             No ROM restrictions             PT/OT              Ice prn              Dressing changes as needed                 Metabolic bone work up                          Suspect baseline osteoporosis                         Recommend outpt dexa                          Low prealbumin, low albumin, low calcium but corrected is appropriate. Ionized calcium normal                         Vitamin D low normal                          Hypophosphatemia-- corrected                            Suspect these deficiencies are due to poor PO intake  RD consult pending    - Pain management:             Scheduled tylenol             Oxy IR for severe pain    - ABL anemia/Hemodynamics            stable    - Medical issues              Per Stroke team and IM   - DVT/PE prophylaxis:             No restrictions for anticoagulation from ortho standpoint   - ID:              periop abx completed    - Metabolic Bone Disease:             Lab work up              Suspect osteoporosis given fx with ground level fall             DEXA as outpt    - Activity:             WBAT L leg   - Dispo:             PT/OT             SNF             Ok for SNF from ortho standpoint   Weightbearing: WBAT LLE Insicional and dressing care: OK to remove dressings as of now and leave open to air with dry gauze PRN Orthopedic device(s): walker and wheelchair Showering: can shower now. Clean wounds only with soap and water. Can leave wounds uncovered  VTE prophylaxis: per medicine and Stroke team   Pain control: tylenol and oxy IR for severe pain  Follow - up plan: 10-14 days with orthopaedics  Contact information:  Myrene GalasMichael Handy MD, Montez MoritaKeith Yahayra Geis PA-C   Mearl LatinKeith W. Makayela Secrest, PA-C [facility] [phone](P) [phone] Traci Sermon(O) [phone] (C) 05/16/2017, 9:23 AM

## 2017-05-16 NOTE — Progress Notes (Signed)
[facility]. Patient aware of transfer and IVs removed prior to this shift. Patient belongings with patient. Careplan and Education completed.

## 2017-05-16 NOTE — Progress Notes (Signed)
Clinical Social Worker facilitated patient discharge including contacting patient family and facility to confirm patient discharge plans.  Clinical information faxed to facility and family agreeable with plan.  CSW arranged [facility] .  RN to call [phone] (pt will go in room 303)  report prior to discharge.  Clinical Social Worker will sign off for now as social work intervention is no longer needed. Please consult us again if new need arises.  Marrianne MoodAshley Gustavo Dispenza, MSW, [employer]CSWA [phone]

## 2017-05-19 ENCOUNTER — Encounter: Payer: Self-pay | Admitting: Adult Health

## 2017-05-19 ENCOUNTER — Non-Acute Institutional Stay (SKILLED_NURSING_FACILITY): Payer: Medicare Other | Admitting: Adult Health

## 2017-05-19 DIAGNOSIS — D62 Acute posthemorrhagic anemia: Secondary | ICD-10-CM

## 2017-05-19 DIAGNOSIS — G40909 Epilepsy, unspecified, not intractable, without status epilepticus: Secondary | ICD-10-CM | POA: Diagnosis not present

## 2017-05-19 DIAGNOSIS — S7292XS Unspecified fracture of left femur, sequela: Secondary | ICD-10-CM

## 2017-05-19 DIAGNOSIS — I10 Essential (primary) hypertension: Secondary | ICD-10-CM | POA: Diagnosis not present

## 2017-05-19 DIAGNOSIS — F0391 Unspecified dementia with behavioral disturbance: Secondary | ICD-10-CM | POA: Diagnosis not present

## 2017-05-19 DIAGNOSIS — Z8673 Personal history of transient ischemic attack (TIA), and cerebral infarction without residual deficits: Secondary | ICD-10-CM | POA: Diagnosis not present

## 2017-05-19 DIAGNOSIS — M8000XS Age-related osteoporosis with current pathological fracture, unspecified site, sequela: Secondary | ICD-10-CM

## 2017-05-19 DIAGNOSIS — R569 Unspecified convulsions: Secondary | ICD-10-CM | POA: Insufficient documentation

## 2017-05-19 NOTE — Progress Notes (Signed)
Location:  [facility] Room Number: 303-A Place of Service:  SNF (31) Provider:  Kenard GowerMedina-Vargas, Monina, NP  Patient Care Team: System, Provider Not In as PCP - General  Extended Emergency Contact Information Primary Emergency Contact: Ilda BassetGrier,Jaime  [place] Mobile Phone: [phone] Relation: Daughter Secondary Emergency Contact: Rose,Eloise Address: [address]          [place] [id number] [place] Home Phone: [phone] Relation: Sister  Code Status:  Full Code  Goals of care: Advanced Directive information Advanced Directives 05/12/2017  Does Patient Have a Medical Advance Directive? No  Would patient like information on creating a medical advance directive? No - Patient declined     Chief Complaint  Patient presents with  . Acute Visit    Hospital followup, status post admission at [facility] 4/14-4/19/19 for a stroke    HPI:  Pt is an 83 y.o. male seen today for hospital followup. He was admitted to [facility] on 05/16/17 for short-term rehabilitation following [facility]4/19/19 for TIA and Left hip fracture S/PIM nail/ORIF. He had acute transient aphasia and right-sided weakness worse than prior. EMS was activated and noted SBP in 70s. He was noted to have a large hematoma in the left hip area. He was diagnosed with left subtrochanteric femur fracture for which he had IM nail/ORIF on 05/13/17. Orthopedics suspect baseline osteoporosis and recommends outpatient DEXA scan. Labs showed Mg normal, Vitamin D2  38.4 and D3  151, intact PTH 63. He was started on oral calcium and Vitamin D supplements. His hgb dropped from 12.6 to 7.5 and was transfused 2 units PRBC. Latest hgb 9.3. Neurology was consulted and recommended continuing aspirin and atorvastatin. 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 He was seen in the room today. He was seen sitting on his wheelchair. He is alert to self and confused to time and place. He did not verbalize pain.   Past Medical History:  Diagnosis Date  . Asthma   . Dementia   . Hypertension   . Seizures (HCC)   . Stroke [facility](HCC)    Past Surgical History:  Procedure Laterality Date  . BACK SURGERY    . FEMUR IM NAIL Left 05/13/2017   Procedure: INTRAMEDULLARY (IM) NAIL FEMORAL;  Surgeon: Myrene GalasHandy, Michael, MD;  Location: [facility] OR;  Service: Orthopedics;  Laterality: Left;  . KNEE SURGERY      Allergies  Allergen Reactions  . Penicillins Hives and Swelling    Patient couldn't answer any questions    Outpatient Encounter Medications as of 05/19/2017  Medication Sig  . acetaminophen (TYLENOL) 325 MG tablet Take 2 tablets (650 mg total) by mouth every 6 (six) hours as needed for mild pain or moderate pain (or temp > 37.5 C (99.5 F)).  Marland Kitchen. aspirin 325 MG tablet Take 1 tablet (325 mg total) by mouth daily.  Marland Kitchen. atorvastatin (LIPITOR) 80 MG tablet Take 1 tablet (80 mg total) by mouth daily at 6 PM.  . calcium citrate (CALCITRATE - DOSED IN MG ELEMENTAL CALCIUM) 950 MG tablet Take 1 tablet (200 mg of elemental calcium total) by mouth 2 (two) times daily.  . cholecalciferol 2000 units TABS Take 1  tablet (2,000 Units total) by mouth daily.  . divalproex (DEPAKOTE ER) 500 MG 24 hr tablet Take 500 mg by mouth at bedtime.  . docusate sodium (COLACE) 100 MG capsule Take 1 capsule (100 mg total) by mouth 2 (two) times daily.  Marland Kitchen escitalopram (LEXAPRO) 10 MG tablet Take 10 mg by mouth daily.  Marland Kitchen lisinopril (PRINIVIL,ZESTRIL) 5 MG tablet Take 2.5 mg by mouth daily.   Marland Kitchen oxycodone (OXY-IR) 5 MG capsule Take 5 mg by mouth every 6 (six) hours as needed for pain.  . vitamin C  (VITAMIN C) 500 MG tablet Take 1 tablet (500 mg total) by mouth daily.  . [DISCONTINUED] feeding supplement, ENSURE ENLIVE, (ENSURE ENLIVE) LIQD Take 237 mLs by mouth 2 (two) times daily between meals.  . [DISCONTINUED] oxyCODONE (OXY IR/ROXICODONE) 5 MG immediate release tablet Take 0.5-1 tablets (2.5-5 mg total) by mouth every 6 (six) hours as needed for severe pain or breakthrough pain.   No facility-administered encounter medications on file as of 05/19/2017.     Review of Systems  Unable to obtain due to dementia    Immunization History  Administered Date(s) Administered  . Tdap 08/22/2016   Pertinent  Health Maintenance Due  Topic Date Due  . PNA vac Low Risk Adult (1 of 2 - PCV13) 06/22/2001  . INFLUENZA VACCINE  08/28/2017      Vitals:   05/19/17 1530  BP: 110/72  Pulse: 78  Resp: 20  Temp: (!) 97.5 F (36.4 C)  TempSrc: Oral  SpO2: 98%  Weight: 150 lb (68 kg)  Height: 5\' 6"  (1.676 m)   Body mass index is 24.21 kg/m.  Physical Exam  GENERAL APPEARANCE: Well nourished. In no acute distress. Normal body habitus SKIN:  Left hip surgical incision  And left lateral knee area surgical incision with intact sutures, dry and no erythema MOUTH and THROAT: Lips are without lesions. Oral mucosa is moist and without lesions. Tongue is normal in shape, size, and color and without lesions RESPIRATORY: Breathing is even & unlabored, BS CTAB CARDIAC: RRR, no murmur,no extra heart sounds, no edema GI: Abdomen soft, normal BS, no masses, no tenderness EXTREMITIES:  Able to move X 4 extremities, limited ROM on LLE due to surgery, right-sided weakness PSYCHIATRIC: Alert to self, disoriented to time and place. Affect and behavior are appropriate   Labs reviewed: Recent Labs    05/12/17 0429 05/13/17 0358 05/15/17 0421 05/16/17 0734  NA 140 139 136  --   K 4.7 4.8 4.1  --   CL 105 107 101  --   CO2 24 24 28   --   GLUCOSE 108* 109* 98  --   BUN 21* 19 14  --   CREATININE  1.36* 0.91 0.83  --   CALCIUM 8.8* 8.5* 8.2*  8.2*  --   MG  --   --  1.9 1.9  PHOS  --   --  2.2* 2.9   Recent Labs    05/12/17 0429 05/15/17 0421 05/16/17 0734  AST 99* 64* 60*  ALT 32 30 32  ALKPHOS 48 37* 41  BILITOT 0.8 0.9 0.9  PROT 6.6 5.8* 5.5*  ALBUMIN 3.0* 2.5* 2.2*   Recent Labs    08/21/16 2200 05/11/17 1135  05/14/17 0338 05/15/17 0421 05/16/17 0734  WBC 8.2 12.6*   < > 8.1 10.3 11.9*  NEUTROABS 6.1 10.9*  --  7.1  --   --   HGB 12.5* 11.7*   < > 7.5* 9.3*  9.3*  HCT 37.4* 34.8*   < > 22.9* 28.0* 29.1*  MCV 79.2 80.6   < > 83.6 82.8 84.1  PLT 252 203   < > 137* 118* 166   < > = values in this interval not displayed.   Lab Results  Component Value Date   TSH 1.845 05/15/2017   Lab Results  Component Value Date   HGBA1C 5.8 (H) 05/12/2017   Lab Results  Component Value Date   CHOL 177 05/12/2017   HDL 72 05/12/2017   LDLCALC 91 05/12/2017   TRIG 69 05/12/2017   CHOLHDL 2.5 05/12/2017    Significant Diagnostic Results in last 30 days:  Ct Angio Head W Or Wo Contrast  Result Date: 05/13/2017 CLINICAL DATA:  Stroke follow-up EXAM: CT ANGIOGRAPHY HEAD AND NECK TECHNIQUE: Multidetector CT imaging of the head and neck was performed using the standard protocol during bolus administration of intravenous contrast. Multiplanar CT image reconstructions and MIPs were obtained to evaluate the vascular anatomy. Carotid stenosis measurements (when applicable) are obtained utilizing NASCET criteria, using the distal internal carotid diameter as the denominator. CONTRAST:  50mL ISOVUE-370 IOPAMIDOL (ISOVUE-370) INJECTION 76% COMPARISON:  Head CT 05/11/2017 FINDINGS: CT HEAD FINDINGS BRAIN: No mass lesion, intraparenchymal hemorrhage or extra-axial collection. No evidence of acute cortical infarct. Extensive chronic ischemic white matter disease and encephalomalacia throughout much of the left hemisphere. Wallerian degeneration at the left cerebral peduncle. VASCULAR: No  hyperdense vessel or unexpected vascular calcification. SKULL: Normal visualized skull base, calvarium and extracranial soft tissues. SINUSES/ORBITS: No sinus fluid levels or advanced mucosal thickening. No mastoid effusion. Normal orbits. CTA NECK FINDINGS AORTIC ARCH: There is no calcific atherosclerosis of the aortic arch. There is no aneurysm, dissection or hemodynamically significant stenosis of the visualized ascending aorta and aortic arch. Conventional 3 vessel aortic branching pattern. The visualized proximal subclavian arteries are widely patent. RIGHT CAROTID SYSTEM: --Common carotid artery: Widely patent origin without common carotid artery dissection or aneurysm. --Internal carotid artery: No dissection, occlusion or aneurysm. No hemodynamically significant stenosis. --External carotid artery: No acute abnormality. LEFT CAROTID SYSTEM: --Common carotid artery: Widely patent origin without common carotid artery dissection or aneurysm. --Internal carotid artery:No dissection, occlusion or aneurysm. No hemodynamically significant stenosis. --External carotid artery: No acute abnormality. VERTEBRAL ARTERIES: Left dominant configuration. Both origins are normal. Right vertebral artery terminates in PICA. There is predominantly noncalcified plaque in the V4 segment of the left vertebral artery. SKELETON: There is no bony spinal canal stenosis. No lytic or blastic lesion. OTHER NECK: Fluid within the distal cervical and proximal thoracic esophagus. UPPER CHEST: No pneumothorax or pleural effusion. No nodules or masses. CTA HEAD FINDINGS ANTERIOR CIRCULATION: --Intracranial internal carotid arteries: Normal. --Anterior cerebral arteries: Normal. Both A1 segments are present. Patent anterior communicating artery. --Middle cerebral arteries: Normal. --Posterior communicating arteries: Absent bilaterally. POSTERIOR CIRCULATION: --Basilar artery: Normal. --Posterior cerebral arteries: Normal. --Superior cerebellar  arteries: Normal. --Inferior cerebellar arteries: Normal variant pattern with the right vertebral artery terminating in PICA. VENOUS SINUSES: As permitted by contrast timing, patent. ANATOMIC VARIANTS: None DELAYED PHASE: No parenchymal contrast enhancement. Review of the MIP images confirms the above findings. IMPRESSION: 1. No emergent large vessel occlusion or flow-limiting stenosis. 2. Focal noncalcified atherosclerotic plaque in the V4 segment of the left vertebral artery causing slightly less than 50% stenosis. 3. Multiple old infarcts and extensive cerebral volume loss. Electronically Signed   By: Deatra Robinson M.D.   On: 05/13/2017 20:03   Ct Angio Neck W Or Wo  Contrast  Result Date: 05/13/2017 CLINICAL DATA:  Stroke follow-up EXAM: CT ANGIOGRAPHY HEAD AND NECK TECHNIQUE: Multidetector CT imaging of the head and neck was performed using the standard protocol during bolus administration of intravenous contrast. Multiplanar CT image reconstructions and MIPs were obtained to evaluate the vascular anatomy. Carotid stenosis measurements (when applicable) are obtained utilizing NASCET criteria, using the distal internal carotid diameter as the denominator. CONTRAST:  50mL ISOVUE-370 IOPAMIDOL (ISOVUE-370) INJECTION 76% COMPARISON:  Head CT 05/11/2017 FINDINGS: CT HEAD FINDINGS BRAIN: No mass lesion, intraparenchymal hemorrhage or extra-axial collection. No evidence of acute cortical infarct. Extensive chronic ischemic white matter disease and encephalomalacia throughout much of the left hemisphere. Wallerian degeneration at the left cerebral peduncle. VASCULAR: No hyperdense vessel or unexpected vascular calcification. SKULL: Normal visualized skull base, calvarium and extracranial soft tissues. SINUSES/ORBITS: No sinus fluid levels or advanced mucosal thickening. No mastoid effusion. Normal orbits. CTA NECK FINDINGS AORTIC ARCH: There is no calcific atherosclerosis of the aortic arch. There is no aneurysm,  dissection or hemodynamically significant stenosis of the visualized ascending aorta and aortic arch. Conventional 3 vessel aortic branching pattern. The visualized proximal subclavian arteries are widely patent. RIGHT CAROTID SYSTEM: --Common carotid artery: Widely patent origin without common carotid artery dissection or aneurysm. --Internal carotid artery: No dissection, occlusion or aneurysm. No hemodynamically significant stenosis. --External carotid artery: No acute abnormality. LEFT CAROTID SYSTEM: --Common carotid artery: Widely patent origin without common carotid artery dissection or aneurysm. --Internal carotid artery:No dissection, occlusion or aneurysm. No hemodynamically significant stenosis. --External carotid artery: No acute abnormality. VERTEBRAL ARTERIES: Left dominant configuration. Both origins are normal. Right vertebral artery terminates in PICA. There is predominantly noncalcified plaque in the V4 segment of the left vertebral artery. SKELETON: There is no bony spinal canal stenosis. No lytic or blastic lesion. OTHER NECK: Fluid within the distal cervical and proximal thoracic esophagus. UPPER CHEST: No pneumothorax or pleural effusion. No nodules or masses. CTA HEAD FINDINGS ANTERIOR CIRCULATION: --Intracranial internal carotid arteries: Normal. --Anterior cerebral arteries: Normal. Both A1 segments are present. Patent anterior communicating artery. --Middle cerebral arteries: Normal. --Posterior communicating arteries: Absent bilaterally. POSTERIOR CIRCULATION: --Basilar artery: Normal. --Posterior cerebral arteries: Normal. --Superior cerebellar arteries: Normal. --Inferior cerebellar arteries: Normal variant pattern with the right vertebral artery terminating in PICA. VENOUS SINUSES: As permitted by contrast timing, patent. ANATOMIC VARIANTS: None DELAYED PHASE: No parenchymal contrast enhancement. Review of the MIP images confirms the above findings. IMPRESSION: 1. No emergent large  vessel occlusion or flow-limiting stenosis. 2. Focal noncalcified atherosclerotic plaque in the V4 segment of the left vertebral artery causing slightly less than 50% stenosis. 3. Multiple old infarcts and extensive cerebral volume loss. Electronically Signed   By: Deatra Robinson M.D.   On: 05/13/2017 20:03   Ct Chest W Contrast  Result Date: 05/11/2017 CLINICAL DATA:  TIA, hypotension, femur fracture, acute generalized abdominal pain, history hypertension, dementia, asthma, seizures EXAM: CT CHEST, ABDOMEN, AND PELVIS WITH CONTRAST TECHNIQUE: Multidetector CT imaging of the chest, abdomen and pelvis was performed following the standard protocol during bolus administration of intravenous contrast. Sagittal and coronal MPR images reconstructed from axial data set. CONTRAST:  ISOVUE-370 IOPAMIDOL (ISOVUE-370) INJECTION 76% IV. No oral contrast administered. COMPARISON:  None FINDINGS: CT CHEST FINDINGS Cardiovascular: Aneurysmal dilatation of the ascending thoracic aorta 4.1 cm diameter image 35. Additional dilatation of the distal aortic arch 4.1 cm diameter. No evidence of aortic dissection. Pulmonary arteries grossly patent on non targeted exam. No pericardial effusion. Mild dilatation of the RIGHT  ventricle. Atherosclerotic calcifications of the coronary arteries and descending thoracic aorta. Mediastinum/Nodes: Esophagus unremarkable. Base of cervical region normal appearance. No thoracic adenopathy. Lungs/Pleura: Lungs emphysematous with scattered atelectasis in medial RIGHT lower lobe and at both lung bases. No pulmonary infiltrate, pleural effusion or pneumothorax. Musculoskeletal: Osseous demineralization. Advanced degenerative changes of BILATERAL glenohumeral joints. Sclerotic lesion within proximal LEFT humerus, question enchondroma versus bone infarct. CT ABDOMEN PELVIS FINDINGS Hepatobiliary: Gallbladder and liver normal appearance Pancreas: Normal appearance Spleen: Normal appearance  Adrenals/Urinary Tract: Adrenal glands, kidneys, ureters, and bladder normal appearance Stomach/Bowel: Normal appendix in RIGHT pelvis. Stomach and bowel loops grossly unremarkable within limitations of patient motion and scattered streak artifacts. Vascular/Lymphatic: Atherosclerotic calcification and tortuosity of abdominal aorta. Aorta normal caliber. Short segment of dissection identified at dilated RIGHT common iliac artery, 19 mm diameter. 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 No adenopathy. Reproductive: Mild prostatic enlargement. Seminal vesicles unremarkable. Other: Small BILATERAL inguinal hernias containing fat. No free air or free fluid. No definite acute inflammatory process. Musculoskeletal: Diffuse osseous demineralization. Scattered degenerative disc disease changes of the thoracolumbar spine most severe at L5-S1 with mild retrolisthesis. Comminuted subtrochanteric fracture of the proximal LEFT femur extending into the lesser trochanter. Surrounding soft tissue edema/hemorrhage advanced degenerative changes of the RIGHT hip joint. IMPRESSION: No acute intra-abdominal or intrapelvic abnormalities. Displaced comminuted proximal LEFT femoral fracture extending into lesser trochanter. Degenerative changes of the thoracolumbar spine. Advanced degenerative changes of both shoulders and RIGHT hip joint. COPD changes with scattered subsegmental atelectasis. Scattered atherosclerotic disease including aneurysmal dilatation of ascending thoracic aorta, distal aortic arch, and BILATERAL internal iliac arteries, recommendation below. 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 Recommend semi-annual imaging followup by CTA or MRA and referral to cardiothoracic surgery if not already obtained. This recommendation follows 2010 ACCF/AHA/AATS/ACR/ASA/SCA/SCAI/SIR/STS/SVM Guidelines for the Diagnosis and Management of  Patients With Thoracic Aortic Disease. Circulation. 2010; 121: Z610-R60 Electronically Signed   By: Ulyses Southward M.D.   On: 05/11/2017 12:48   Ct Abdomen Pelvis W Contrast  Result Date: 05/11/2017 CLINICAL DATA:  TIA, hypotension, femur fracture, acute generalized abdominal pain, history hypertension, dementia, asthma, seizures EXAM: CT CHEST, ABDOMEN, AND PELVIS WITH CONTRAST TECHNIQUE: Multidetector CT imaging of the chest, abdomen and pelvis was performed following the standard protocol during bolus administration of intravenous contrast. Sagittal and coronal MPR images reconstructed from axial data set. CONTRAST:  ISOVUE-370 IOPAMIDOL (ISOVUE-370) INJECTION 76% IV. No oral contrast administered. COMPARISON:  None FINDINGS: CT CHEST FINDINGS Cardiovascular: Aneurysmal dilatation of the ascending thoracic aorta 4.1 cm diameter image 35. Additional dilatation of the distal aortic arch 4.1 cm diameter. No evidence of aortic dissection. Pulmonary arteries grossly patent on non targeted exam. No pericardial effusion. Mild dilatation of the RIGHT ventricle. Atherosclerotic calcifications of the coronary arteries and descending thoracic aorta. Mediastinum/Nodes: Esophagus unremarkable. Base of cervical region normal appearance. No thoracic adenopathy. Lungs/Pleura: Lungs emphysematous with scattered atelectasis in medial RIGHT lower lobe and at both lung bases. No pulmonary infiltrate, pleural effusion or pneumothorax. Musculoskeletal: Osseous demineralization. Advanced degenerative changes of BILATERAL glenohumeral joints. Sclerotic lesion within proximal LEFT humerus, question enchondroma versus bone infarct. CT ABDOMEN PELVIS FINDINGS Hepatobiliary: Gallbladder and liver normal appearance Pancreas: Normal appearance Spleen: Normal appearance Adrenals/Urinary Tract: Adrenal glands, kidneys, ureters, and bladder normal appearance Stomach/Bowel: Normal appendix in RIGHT pelvis. Stomach and bowel loops grossly  unremarkable within limitations of patient motion and scattered streak artifacts. Vascular/Lymphatic: Atherosclerotic calcification  and tortuosity of abdominal aorta. Aorta normal caliber. Short segment of dissection identified at dilated RIGHT common iliac artery, 19 mm diameter. 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 No adenopathy. Reproductive: Mild prostatic enlargement. Seminal vesicles unremarkable. Other: Small BILATERAL inguinal hernias containing fat. No free air or free fluid. No definite acute inflammatory process. Musculoskeletal: Diffuse osseous demineralization. Scattered degenerative disc disease changes of the thoracolumbar spine most severe at L5-S1 with mild retrolisthesis. Comminuted subtrochanteric fracture of the proximal LEFT femur extending into the lesser trochanter. Surrounding soft tissue edema/hemorrhage advanced degenerative changes of the RIGHT hip joint. IMPRESSION: No acute intra-abdominal or intrapelvic abnormalities. Displaced comminuted proximal LEFT femoral fracture extending into lesser trochanter. Degenerative changes of the thoracolumbar spine. Advanced degenerative changes of both shoulders and RIGHT hip joint. COPD changes with scattered subsegmental atelectasis. Scattered atherosclerotic disease including aneurysmal dilatation of ascending thoracic aorta, distal aortic arch, and BILATERAL internal iliac arteries, recommendation below. 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 Recommend semi-annual imaging followup by CTA or MRA and referral to cardiothoracic surgery if not already obtained. This recommendation follows 2010 ACCF/AHA/AATS/ACR/ASA/SCA/SCAI/SIR/STS/SVM Guidelines for the Diagnosis and Management of Patients With Thoracic Aortic Disease. Circulation. 2010; 121: Z610-R60 Electronically Signed   By: Ulyses Southward M.D.   On: 05/11/2017 12:48   Dg C-arm 1-60  Min  Result Date: 05/13/2017 CLINICAL DATA:  Left femur fracture ORIF. EXAM: DG C-ARM 61-120 MIN; LEFT FEMUR 2 VIEWS COMPARISON:  Left femur x-rays dated May 11, 2017. FINDINGS: Multiple intraoperative x-rays demonstrate interval cephalomedullary rod fixation of the left proximal femur fracture. Alignment is near anatomic. No hardware complication. Unchanged avulsion fracture of the lesser trochanter. IMPRESSION: 1. Left proximal femur fracture ORIF.  Alignment is near anatomic. 2. Unchanged lesser trochanteric avulsion fracture. FLUOROSCOPY TIME:  99 seconds. C-arm fluoroscopic images were obtained intraoperatively and submitted for post operative interpretation. Electronically Signed   By: Obie Dredge M.D.   On: 05/13/2017 13:14   Dg C-arm 1-60 Min  Result Date: 05/13/2017 CLINICAL DATA:  Left femur fracture ORIF. EXAM: DG C-ARM 61-120 MIN; LEFT FEMUR 2 VIEWS COMPARISON:  Left femur x-rays dated May 11, 2017. FINDINGS: Multiple intraoperative x-rays demonstrate interval cephalomedullary rod fixation of the left proximal femur fracture. Alignment is near anatomic. No hardware complication. Unchanged avulsion fracture of the lesser trochanter. IMPRESSION: 1. Left proximal femur fracture ORIF.  Alignment is near anatomic. 2. Unchanged lesser trochanteric avulsion fracture. FLUOROSCOPY TIME:  99 seconds. C-arm fluoroscopic images were obtained intraoperatively and submitted for post operative interpretation. Electronically Signed   By: Obie Dredge M.D.   On: 05/13/2017 13:14   Dg Femur Port 1v Left  Result Date: 05/11/2017 CLINICAL DATA:  Known fracture, following traction EXAM: LEFT FEMUR PORTABLE 1 VIEW COMPARISON:  Study obtained earlier in the day FINDINGS: Frontal view obtained. Comminuted fracture of the proximal left femoral diaphysis noted without appreciable change. There is avulsion of the lesser trochanter. There is medial displacement of the distal fracture fragment with respect to  proximal fragment with 8.7 cm of overriding of fracture fragments. No distal fracture. Advanced arthropathy noted in the left knee joint region. Mild narrowing right hip joint. IMPRESSION: Persistent comminuted fracture proximal femur with a 0.7 cm of overriding of fracture fragments. Avulsion of the lesser trochanter. Alignment similar to pre traction images. Advanced arthropathy in the knee joint. No dislocation evident. Electronically Signed   By: Bretta Bang III M.D.  On: 05/11/2017 15:27   Dg Femur Min 2 Views Left  Result Date: 05/13/2017 CLINICAL DATA:  83 y/o  M; postop left femur. EXAM: LEFT FEMUR 2 VIEWS COMPARISON:  05/11/2017 left proximal femur radiograph. FINDINGS: Left proximal femoral diaphysis acute fracture post nail fixation with near anatomic alignment. Medially displaced fracture of lesser trochanter. No hip dislocation. No new fracture identified. 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 Mild edema and air within the surrounding soft tissues. IMPRESSION: 1. Left proximal femoral diaphysis acute fracture post nail fixation with near anatomic alignment. Expected postsurgical changes in soft tissues. 2. Mild left hip and severe left knee osteoarthrosis. Electronically Signed   By: Mitzi Hansen M.D.   On: 05/13/2017 20:56   Dg Femur Min 2 Views Left  Result Date: 05/13/2017 CLINICAL DATA:  Left femur fracture ORIF. EXAM: DG C-ARM 61-120 MIN; LEFT FEMUR 2 VIEWS COMPARISON:  Left femur x-rays dated May 11, 2017. FINDINGS: Multiple intraoperative x-rays demonstrate interval cephalomedullary rod fixation of the left proximal femur fracture. Alignment is near anatomic. No hardware complication. Unchanged avulsion fracture of the lesser trochanter. IMPRESSION: 1. Left proximal femur fracture ORIF.  Alignment is near anatomic. 2. Unchanged lesser trochanteric avulsion fracture. FLUOROSCOPY TIME:  99 seconds.  C-arm fluoroscopic images were obtained intraoperatively and submitted for post operative interpretation. Electronically Signed   By: Obie Dredge M.D.   On: 05/13/2017 13:14   Dg Femur Port Min 2 Views Left  Result Date: 05/11/2017 CLINICAL DATA:  Larey Seat today poor historian, history hypertension, asthma, stroke, dementia, former smoker EXAM: LEFT FEMUR PORTABLE 2 VIEWS COMPARISON:  None FINDINGS: Osseous demineralization. Displaced oblique fracture of the proximal LEFT femoral diaphysis extending to involve the lesser trochanter. LEFT femoral head, neck and greater trochanter appear intact. LEFT hip joint space preserved without dislocation. Advanced tricompartmental osteoarthritic changes of the LEFT knee with joint space narrowing and spur formation. Calcified debris posteriorly at the LEFT knee joint. Distal LEFT femur intact. IMPRESSION: Displaced oblique fracture of proximal LEFT femoral diaphysis extending into lesser trochanter. Osseous demineralization with advanced degenerative changes of the LEFT knee joint. Electronically Signed   By: Ulyses Southward M.D.   On: 05/11/2017 13:33   Ct Head Code Stroke Wo Contrast  Result Date: 05/11/2017 CLINICAL DATA:  Code stroke. 82 year old male with right side weakness. EXAM: CT HEAD WITHOUT CONTRAST TECHNIQUE: Contiguous axial images were obtained from the base of the skull through the vertex without intravenous contrast. COMPARISON:  Head CTs 08/21/2016 and earlier. FINDINGS: Brain: Chronic left MCA territory encephalomalacia, confluent left hemisphere white matter hypodensity, and ex vacuo enlargement of the left lateral ventricle appears stable since July 2018. There is associated confluent hypodensity in the left cauda thalamic groove and posterior limb left internal capsule with evidence of Wallerian degeneration at the left midbrain and brainstem. Moderate contralateral right hemisphere white matter hypodensity also appears stable. No midline shift,  ventriculomegaly, mass effect, evidence of mass lesion, intracranial hemorrhage or evidence of cortically based acute infarction. No other cortical encephalomalacia identified. Vascular: Calcified atherosclerosis at the skull base. Intracranial artery tortuosity. No suspicious intracranial vascular hyperdensity. Skull: No acute osseous abnormality identified. Partially visible advanced upper cervical spine degeneration. Sinuses/Orbits: Visualized paranasal sinuses and mastoids are stable and well pneumatized. Other: Visualized orbits and scalp soft tissues are within normal limits. ASPECTS [facility] Stroke Program Early CT Score) Total score (0-10 with 10 being normal): 10 (chronic left hemisphere encephalomalacia). IMPRESSION: 1. No acute intracranial  abnormality. Pronounced chronic encephalomalacia in the left hemisphere with left deep gray matter and brainstem Wallerian degeneration appears stable since 2018. 2. No acute intracranial hemorrhage. ASPECTS is 10 (chronic left hemisphere encephalomalacia). 3. Study discussed by telephone with Dr. Caryl Pina on 05/11/2017 at 12:01 . Electronically Signed   By: Odessa Fleming M.D.   On: 05/11/2017 12:02    Assessment/Plan  1. Closed fracture of left femur, unspecified fracture morphology, unspecified portion of femur, sequela - S/P IM nail/ORIF on 4/16, follow up with orthopedics, Dr. Carola Frost, in 10 days, LLE WBAT, continue ASA 325 mg daily for DVT prophylaxis, Oxycodone 5 mg Q 6 hours PRN for pain   2. History of CVA (cerebrovascular accident) - continue Lisinopril 2.5 mg daily, Atorvastatin 80 mg daily   3. Essential hypertension - stable, continue Lisinopril 2.5 mg daily   4. Anemia associated with acute blood loss - hgb dropped from 12.5 to 9.3, S/P transfusion of 2 units PRBC   5. Seizure disorder (HCC) -  continue Divalproex ER 500 mg Q HS   6. Dementia with behavioral disturbance, unspecified dementia type - continue supportive care, fall  precautions   7. Osteoporosis - continue Calcitrate 200 mg 1 tab BID and Vitamin D3 1,000 units give 2 tablets daily, fall precautions   Family/ staff Communication: Discussed plan of care with resident.  Labs/tests ordered:  None  Goals of care:   Short-term care  Kenard Gower, NP [facility] [phone] (Monday-Friday 8:00 a.m. - 5:00 p.m.) [phone] (after hours)

## 2017-05-20 ENCOUNTER — Encounter: Payer: Self-pay | Admitting: Internal Medicine

## 2017-05-20 ENCOUNTER — Non-Acute Institutional Stay (SKILLED_NURSING_FACILITY): Payer: Medicare Other | Admitting: Internal Medicine

## 2017-05-20 DIAGNOSIS — G459 Transient cerebral ischemic attack, unspecified: Secondary | ICD-10-CM | POA: Diagnosis not present

## 2017-05-20 DIAGNOSIS — E46 Unspecified protein-calorie malnutrition: Secondary | ICD-10-CM

## 2017-05-20 DIAGNOSIS — S7292XS Unspecified fracture of left femur, sequela: Secondary | ICD-10-CM

## 2017-05-20 DIAGNOSIS — I7772 Dissection of iliac artery: Secondary | ICD-10-CM | POA: Diagnosis not present

## 2017-05-20 DIAGNOSIS — K5903 Drug induced constipation: Secondary | ICD-10-CM

## 2017-05-20 NOTE — Patient Instructions (Signed)
See assessment and plan under each diagnosis in the problem list and acutely for this visit 

## 2017-05-20 NOTE — Assessment & Plan Note (Signed)
Continue vitamin daily and calcium supplements DEXA as outpatient Follow-up with Dr. Carola FrostHandy as scheduled

## 2017-05-20 NOTE — Assessment & Plan Note (Signed)
As per Vascular Surgery

## 2017-05-20 NOTE — Assessment & Plan Note (Signed)
Nutritional consultation for protein supplementation 

## 2017-05-20 NOTE — Progress Notes (Signed)
NURSING HOME LOCATION:  [facility] ROOM NUMBER:  [id number]  CODE STATUS:  Full Code  PCP:  System, Provider Not In   This is a comprehensive admission note to [facility] performed on this date less than 30 days from date of admission. Included are preadmission medical/surgical history;reconciled medication list; family history; social history and comprehensive review of systems.  Corrections and additions to the records were documented. Comprehensive physical exam was also performed. Additionally a clinical summary was entered for each active diagnosis pertinent to this admission in the Problem List to enhance continuity of care.  HPI: patient was hospitalized 4/14-4/19/19 having fallen the day of admission and presenting with acute transient aphasia and right-sided weakness. He was admitted for acute left hip fracture and TIA. Orthopedics was consulted for left subtrochanteric femur fracture and intramedullary nail/ORIF was performed 4/16 by Dr Carola FrostHandy. Outpatient DEXA scan was recommended for probable osteoporosis. Oral calcium and vitamin D supplements were initiated by orthopedics. He exhibited perioperative progression of anemia, hemoglobin dropped from 12.6-7.5. He was transfused 2 units of red cells. Hemoglobin was essentially stable at 9.3 prior to discharge. Dr. Pearlean BrownieSethi, Neurology felt the patient had a TIA versus a small infarct in the setting of hypotension superimposed on prior left hemispheric stroke.Aspirin and statin were to be continued for secondary stroke prevention. He was to follow-up in the stroke clinic as an outpatient. 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 Vascular surgery recommended outpatient follow-up in 6 months with aortoiliac duplex. He developed acute kidney injury which did resolve with transfusion & hydration. At admission he was hypotensive and lisinopril was held. Thrombocytopenia was attributed to  the acute blood loss. This did resolve. Predischarge labs reveal an albumin of 2.2, AST of 60, total protein of 5.5, hemoglobin 9.3 and hematocrit 29.1. Indices were normochromic, normocytic.  Past medical and surgical history:Includes anxiety, essential hypertension, seizure disorder, dementia, COPD, and asthma. He has had prior knee and back surgery.  Social history:Nondrinker, former smoker.  Family history:Limited history reviewed   Review of systems:  Could not be completed due to dementia. He cannot give the date beyond "July". He does describe constipation. He describes "little pain" in the left lower extremity.   Constitutional: No fever,significant weight change, fatigue  Eyes: No redness, discharge, pain, vision change ENT/mouth: No nasal congestion, purulent discharge, earache, change in hearing, sore throat  Cardiovascular: No chest pain, palpitations, paroxysmal nocturnal dyspnea, claudication, edema  Respiratory: No cough, sputum production, hemoptysis, DOE, significant snoring, apnea Gastrointestinal: No heartburn, dysphagia, abdominal pain, nausea /vomiting, rectal bleeding, melena Genitourinary: No dysuria, hematuria, pyuria, incontinence, nocturia Dermatologic: No rash, pruritus, change in appearance of skin Neurologic: No dizziness, headache, syncope, seizures, numbness, tingling Psychiatric: No significant anxiety, depression, insomnia, anorexia Endocrine: No change in hair/skin/ nails, excessive thirst, excessive hunger, excessive urination  Hematologic/lymphatic: No significant bruising, lymphadenopathy, abnormal bleeding Allergy/immunology: No itchy/watery eyes, significant sneezing, urticaria, angioedema  Physical exam:  Pertinent or positive findings:He has a full beard and mustache. He appears suboptimally nourished. The maxilla is edentulous.The lower teeth are in poor repair with caries and plaque. Heart sounds are distant. Breath sounds are decreased. There is  decreased range of motion of the left lower extremity. Surgical dressing is present over the left lower extremity.   General appearance: no acute distress, increased work of breathing is present.   Lymphatic: No lymphadenopathy about the head, neck, axilla. Eyes: No conjunctival inflammation or lid edema is present. There  is no scleral icterus. Ears:  External ear exam shows no significant lesions or deformities.   Nose:  External nasal examination shows no deformity or inflammation. Nasal mucosa are pink and moist without lesions, exudates Oral exam: Lips and gums are healthy appearing.There is no oropharyngeal erythema or exudate. Neck:  No thyromegaly, masses, tenderness noted.    Heart:  Normal rate and regular rhythm. S1 and S2 normal without gallop, murmur, click, rub .  Lungs: without wheezes, rhonchi, rales, rubs. Abdomen: Bowel sounds are normal.  Abdomen is soft and nontender with no organomegaly, hernias, masses. GU: Deferred  Extremities:  No cyanosis, clubbing, edema  Neurologic exam:  Strength equal  in upper  Balance, Rhomberg, finger to nose testing could not be completed due to clinical state Skin: Warm & dry w/o tenting. No significant lesions or rash.  See clinical summary under each active problem in the Problem List with associated updated therapeutic plan.

## 2017-05-20 NOTE — Assessment & Plan Note (Signed)
Continue aspirin and statin as secondary stroke prevention

## 2017-05-28 ENCOUNTER — Other Ambulatory Visit: Payer: Self-pay

## 2017-05-28 DIAGNOSIS — I7772 Dissection of iliac artery: Secondary | ICD-10-CM

## 2017-05-28 DIAGNOSIS — S7222XD Displaced subtrochanteric fracture of left femur, subsequent encounter for closed fracture with routine healing: Secondary | ICD-10-CM | POA: Diagnosis not present

## 2017-06-10 ENCOUNTER — Ambulatory Visit (INDEPENDENT_AMBULATORY_CARE_PROVIDER_SITE_OTHER): Payer: Medicare Other | Admitting: Adult Health

## 2017-06-10 ENCOUNTER — Encounter: Payer: Self-pay | Admitting: Adult Health

## 2017-06-10 VITALS — BP 93/63 | HR 70 | Ht 66.0 in

## 2017-06-10 DIAGNOSIS — I10 Essential (primary) hypertension: Secondary | ICD-10-CM

## 2017-06-10 DIAGNOSIS — I639 Cerebral infarction, unspecified: Secondary | ICD-10-CM

## 2017-06-10 DIAGNOSIS — G459 Transient cerebral ischemic attack, unspecified: Secondary | ICD-10-CM | POA: Diagnosis not present

## 2017-06-10 DIAGNOSIS — S7292XS Unspecified fracture of left femur, sequela: Secondary | ICD-10-CM

## 2017-06-10 DIAGNOSIS — E785 Hyperlipidemia, unspecified: Secondary | ICD-10-CM

## 2017-06-10 NOTE — Progress Notes (Signed)
I agree with the above plan 

## 2017-06-10 NOTE — Patient Instructions (Signed)
Continue aspirin 325 mg daily  and lipitor  for secondary stroke prevention  Continue to follow up with PCP regarding cholesterl and blood pressure management   Pain management for femur fracture to be able to continue to participate in therapy  Continue physical therapy  Continue to monitor blood pressure at home  Maintain strict control of hypertension with blood pressure goal below 130/90, diabetes with hemoglobin A1c goal below 6.5% and cholesterol with LDL cholesterol (bad cholesterol) goal below 70 mg/dL. I also advised the patient to eat a healthy diet with plenty of whole grains, cereals, fruits and vegetables, exercise regularly and maintain ideal body weight.  Followup in the future with me in 3 months or call earlier if needed         Thank you for coming to see [place] at [facility]. I hope we have been able to provide you high quality care today.  You may receive a patient satisfaction survey over the next few weeks. We would appreciate your feedback and comments so that we may continue to improve ourselves and the health of our patients.

## 2017-06-10 NOTE — Progress Notes (Signed)
[facility] [address] [place]. [place] [address] [phone]       OFFICE FOLLOW UP NOTE  [name] Date of Birth:  05/15/1934 Medical Record Number:  [id number]   Reason for Referral:  hospital TIA follow up  CHIEF COMPLAINT:  Chief Complaint  Patient presents with  . Follow-up    Stroke follow up, pt was seen by Dr. [name] in hospital, Pt is in room with daughter Johnathan Mitchell daughter, PT is at [facility]  rehab    HPI: Johnathan Mitchell is being seen today for initial visit in the office for TIA vs small left hemispheric infarct on 05/11/17. History obtained from patient and chart review. Reviewed all radiology images and labs personally.  Johnathan Mitchell an 83 y.o.malewithprior CVA with right-sided residual weaknessiswheelchair-bound, dementia, hypertension, seizure who presented to the ED with acute transient aphasia, flaccidity on the right side and came to the ER as a code stroke. Per family at 7 am that morning,patient had a witnessed fall. Family was able to get him up and put him in his wheelchair without incident. Per family patient was fine and conversing with them normally. Around 10:50 AM, while watching TV with his family they noticed he suddenly became aphasic and flaccid on his right upper and lower extremities. They called EMS immediately and upon arrival patient's SBPwas in the 70s and with a bit of movement it was 90/60. While in the ambulance en route to the hospitalthepatient complained of severe left leg pain and thenit wasnoticedthathe had very large hematoma in the left hip area,wincingin pain when left leg manipulated. On arrival to the hospital patient's blood pressure was elevated at 183/169, hewas more verbal without evidence of dysarthria. Hecontinuedto have right-sided weakness but no drift noted on bilateral lower extremities;equal strength with bilateral great toe raise. STATCT of the head  revealedachronicoldleft stroke and no acute changes. CTA head/neck showed no emergent LVO but did show focal noncalcified atherosclerotic plaque in the V4 segment of the left vertebral artery causing slightly less than 50% stenosis. X-ray was obtained of his left femur which did show displaced oblique fracture of proximal left femoral diaphysis extending into lesser trochanter. Patient underwent intramedullary nailing on 05/13/17. Patient unable to have MRI/MRA due to femoral fracture per hospital notes.  It was determined that with prior left hemispheric stroke and presentation with transient speech difficulties and right sided weakness this was likely due to TIA versus small infarct in the setting of hypotension. LDL 91 and A1c 5.8. It was recommended to continue aspirin and atorvastatin for secondary stroke prevention. Patient d/c'd to SNF for PT/OT.   Patient is being seen today for hospital follow-up and is accompanied by his daughter.  He is currently residing at [facility] where he has been receiving physical therapy for recent femur fracture.  Daughter states that he has been walking with physical therapy but she is concerned of this as patient was not walking previously to obtaining fracture.  He continues to take aspirin without side effects of bleeding or bruising.  Continues to take Lipitor without side effects of myalgias.  He has been recently complaining of left knee pain but this is not new for patient as he has previously had knee surgery.  Blood pressure today is 93/63 and patient or daughter unsure what his normal BP ranges are at facility.  Denies new or worsening stroke/TIA symptoms.  ROS:   14 system review  of systems performed and negative with exception of walking difficulty and pain  PMH:  Past Medical History:  Diagnosis Date  . Asthma   . Dementia   . Hypertension   . Seizures (HCC)   . Stroke [facility])     PSH:  Past Surgical History:   Procedure Laterality Date  . BACK SURGERY    . FEMUR IM NAIL Left 05/13/2017   Procedure: INTRAMEDULLARY (IM) NAIL FEMORAL;  Surgeon: [name], MD;  Location: [facility] OR;  Service: Orthopedics;  Laterality: Left;  . KNEE SURGERY      Social History:  Social History   Socioeconomic History  . Marital status: Single    Spouse name: Not on file  . Number of children: Not on file  . Years of education: Not on file  . Highest education level: Not on file  Occupational History  . Not on file  Social Needs  . Financial resource strain: Not on file  . Food insecurity:    Worry: Not on file    Inability: Not on file  . Transportation needs:    Medical: Not on file    Non-medical: Not on file  Tobacco Use  . Smoking status: Former Games developer  . Smokeless tobacco: Never Used  Substance and Sexual Activity  . Alcohol use: No  . Drug use: No  . Sexual activity: Not on file  Lifestyle  . Physical activity:    Days per week: Not on file    Minutes per session: Not on file  . Stress: Not on file  Relationships  . Social connections:    Talks on phone: Not on file    Gets together: Not on file    Attends religious service: Not on file    Active member of club or organization: Not on file    Attends meetings of clubs or organizations: Not on file    Relationship status: Not on file  . Intimate partner violence:    Fear of current or ex partner: Not on file    Emotionally abused: Not on file    Physically abused: Not on file    Forced sexual activity: Not on file  Other Topics Concern  . Not on file  Social History Narrative  . Not on file    Family History:  Family History  Problem Relation Age of Onset  . Hyperlipidemia Mother   . Hypertension Mother     Medications:   Current Outpatient Medications on File Prior to Visit  Medication Sig Dispense Refill  . acetaminophen (TYLENOL) 325 MG tablet Take 2 tablets (650 mg total) by mouth every 6 (six) hours as needed for  mild pain or moderate pain (or temp > 37.5 C (99.5 F)). 60 tablet 0  . aspirin 325 MG tablet Take 1 tablet (325 mg total) by mouth daily.    Marland Kitchen atorvastatin (LIPITOR) 80 MG tablet Take 1 tablet (80 mg total) by mouth daily at 6 PM.    . calcium citrate (CALCITRATE - DOSED IN MG ELEMENTAL CALCIUM) 950 MG tablet Take 1 tablet (200 mg of elemental calcium total) by mouth 2 (two) times daily. 90 tablet 1  . cholecalciferol 2000 units TABS Take 1 tablet (2,000 Units total) by mouth daily. 60 tablet 1  . divalproex (DEPAKOTE ER) 500 MG 24 hr tablet Take 500 mg by mouth at bedtime.    . docusate sodium (COLACE) 100 MG capsule Take 1 capsule (100 mg total) by mouth 2 (two) times daily.    Marland Kitchen  escitalopram (LEXAPRO) 10 MG tablet Take 10 mg by mouth daily.     Marland Kitchen lisinopril (PRINIVIL,ZESTRIL) 5 MG tablet Take 2.5 mg by mouth daily.     . meloxicam (MOBIC) 7.5 MG tablet Take 7.5 mg by mouth daily.    Marland Kitchen NUTRITIONAL SUPPLEMENT LIQD Take 5 mg by mouth daily. MedPass    . oxycodone (OXY-IR) 5 MG capsule Take 5 mg by mouth every 6 (six) hours as needed for pain.    . vitamin C (VITAMIN C) 500 MG tablet Take 1 tablet (500 mg total) by mouth daily. 30 tablet 1   No current facility-administered medications on file prior to visit.     Allergies:   Allergies  Allergen Reactions  . Penicillins Hives and Swelling    Patient couldn't answer any questions     Physical Exam  Vitals:   06/10/17 1035  BP: 93/63  Pulse: 70  Height:  (1.676 m)   Body mass index is 25.11 kg/m. No exam data present  General: well developed, elderly African-American male, well nourished, seated, in no evident distress Head: head normocephalic and atraumatic.   Neck: supple with no carotid or supraclavicular bruits Cardiovascular: regular rate and rhythm, no murmurs Musculoskeletal: no deformity Skin:  no rash/petichiae Vascular:  Normal pulses all extremities  Neurologic Exam Mental Status: Awake and fully alert.   Disoriented to year and place. Attention span, concentration and fund of knowledge appropriate. Mood and affect appropriate.  Cranial Nerves: Fundoscopic exam reveals sharp disc margins. Pupils equal, briskly reactive to light. Extraocular movements full without nystagmus. Visual fields full to confrontation. Hearing intact. Facial sensation intact. Face, tongue, palate moves normally and symmetrically.  Motor: Normal bulk and tone. Normal strength in all tested extremity muscles.  Unable to adequately test left lower extremity as exam limited due to pain Sensory.: intact to touch , pinprick , position and vibratory sensation.  Coordination: Rapid alternating movements normal in all extremities. Finger-to-nose and heel-to-shin performed accurately bilaterally. Gait and Station: Patient currently sitting in wheelchair; he does ambulate alongside physical therapy but due to increased pain at this time gait not tested Reflexes: 1+ and symmetric. Toes downgoing.    NIHSS  0 Modified Rankin  2   Diagnostic Data (Labs, Imaging, Testing)  CT HEAD WO CONTRAST 05/11/17 IMPRESSION: 1. No acute intracranial abnormality. Pronounced chronic encephalomalacia in the left hemisphere with left deep gray matter and brainstem Wallerian degeneration appears stable since 2018. 2. No acute intracranial hemorrhage. ASPECTS is 10 (chronic left hemisphere encephalomalacia).  CT ANGIO NECK W OR WO CONTRAST CT ANGIO HEAD W OR WO CONTRAST 05/13/17 IMPRESSION: 1. No emergent large vessel occlusion or flow-limiting stenosis. 2. Focal noncalcified atherosclerotic plaque in the V4 segment of the left vertebral artery causing slightly less than 50% stenosis. 3. Multiple old infarcts and extensive cerebral volume loss.  ECHOCARDIOGRAM COMPLETE 05/12/17 Study Conclusions - Left ventricle: The cavity size was normal. There was moderate   concentric hypertrophy. Systolic function was normal. The   estimated ejection  fraction was in the range of 60% to 65%. Wall   motion was normal; there were no regional wall motion   abnormalities. There was an increased relative contribution of   atrial contraction to ventricular filling. Doppler parameters are   consistent with abnormal left ventricular relaxation (grade 1   diastolic dysfunction). - Aortic valve: Trileaflet; mildly thickened, mildly calcified   leaflets. - Left atrium: The atrium was mildly dilated.  VAS US CAROTID DUPLEX BILATERAL 05/12/17  Final Interpretation: Right Carotid: The extracranial vessels were near-normal with only minimal wall        thickening or plaque. Left Carotid: The extracranial vessels were near-normal with only minimal wall       thickening or plaque. Vertebrals: Bilateral vertebral arteries demonstrate antegrade flow. Subclavians: Normal flow hemodynamics were seen in bilateral subclavian       arteries.    ASSESSMENT: Johnathan Mitchell is a 83 y.o. year old male here with TIA vs small left hemispheric infarct in setting of hypotension on 05/12/17. Vascular risk factors include HLD, HTN and DM.    PLAN: -Continue aspirin 325 mg daily  and Lipitor for secondary stroke prevention -Continue PT and recommended ensuring pain management prior to therapy sessions in order for patient to fully participate in therapy -F/u with PCP regarding your HLD and HTN management -continue to monitor BP at facility and advised to continue to monitor and avoid hypotension -Maintain strict control of hypertension with blood pressure goal below 130/90, diabetes with hemoglobin A1c goal below 6.5% and cholesterol with LDL cholesterol (bad cholesterol) goal below 70 mg/dL. I also advised the patient to eat a healthy diet with plenty of whole grains, cereals, fruits and vegetables, exercise regularly and maintain ideal body weight.  Follow up in 3 months or call earlier if needed   Greater than 50% time during this 25  minute consultation visit was spent on counseling and coordination of care about HLD, and HTN, discussion about risk benefit of anticoagulation and answering questions.     George Hugh, AGNP-BC  [facility] [address] [place], [place] [address]  Phone [phone] Fax [phone]

## 2017-06-12 ENCOUNTER — Encounter: Payer: Self-pay | Admitting: Adult Health

## 2017-06-12 ENCOUNTER — Non-Acute Institutional Stay (SKILLED_NURSING_FACILITY): Payer: Medicare Other | Admitting: Adult Health

## 2017-06-12 DIAGNOSIS — I959 Hypotension, unspecified: Secondary | ICD-10-CM | POA: Diagnosis not present

## 2017-06-12 DIAGNOSIS — F39 Unspecified mood [affective] disorder: Secondary | ICD-10-CM

## 2017-06-12 DIAGNOSIS — Z8673 Personal history of transient ischemic attack (TIA), and cerebral infarction without residual deficits: Secondary | ICD-10-CM

## 2017-06-12 DIAGNOSIS — S7292XS Unspecified fracture of left femur, sequela: Secondary | ICD-10-CM | POA: Diagnosis not present

## 2017-06-12 DIAGNOSIS — F0151 Vascular dementia with behavioral disturbance: Secondary | ICD-10-CM | POA: Diagnosis not present

## 2017-06-12 DIAGNOSIS — F01518 Vascular dementia, unspecified severity, with other behavioral disturbance: Secondary | ICD-10-CM

## 2017-06-12 DIAGNOSIS — F339 Major depressive disorder, recurrent, unspecified: Secondary | ICD-10-CM | POA: Diagnosis not present

## 2017-06-12 NOTE — Progress Notes (Signed)
Location:  [facility] Room Number: 303-A Place of Service:  SNF (31) Provider:  Kenard Gower, NP  Patient Care Team: Administration, Veterans as PCP - General  Extended Emergency Contact Information Primary Emergency Contact: Ilda Basset [place] Mobile Phone: [phone] Relation: Daughter Secondary Emergency Contact: Rose,Eloise Address: [address]          [place] [address] [place] Home Phone: [phone] Relation: Sister  Code Status:  Full Code  Goals of care: Advanced Directive information Advanced Directives 05/12/2017  Does Patient Have a Medical Advance Directive? No  Would patient like information on creating a medical advance directive? No - Patient declined     Chief Complaint  Patient presents with  . Medical Management of Chronic Issues    Monthly routine [facility] visit    HPI:  Pt is an 83 y.o. male seen today for medical management of chronic diseases.  He is a short-term rehabilitation resident at [facility].  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 He was seen in the room today. He was seen combing his hair. BPs were reviewed and noted to be low -  99/63, 95/59, 11/71, 99/56.   Past Medical History:  Diagnosis Date  . Asthma   . Dementia   . Hypertension   . Seizures (HCC)   . Stroke [facility])    Past Surgical History:  Procedure Laterality Date  . BACK SURGERY    . FEMUR IM NAIL Left 05/13/2017   Procedure: INTRAMEDULLARY (IM) NAIL FEMORAL;  Surgeon: [name], MD;  Location: [facility] OR;  Service: Orthopedics;  Laterality: Left;  . KNEE SURGERY      Allergies  Allergen Reactions  . Penicillins Hives and Swelling    Patient couldn't answer any questions    Outpatient Encounter Medications as of 06/12/2017  Medication Sig  .  acetaminophen (TYLENOL) 325 MG tablet Take 2 tablets (650 mg total) by mouth every 6 (six) hours as needed for mild pain or moderate pain (or temp > 37.5 C (99.5 F)).  Marland Kitchen aspirin 325 MG tablet Take 1 tablet (325 mg total) by mouth daily.  Marland Kitchen atorvastatin (LIPITOR) 80 MG tablet Take 1 tablet (80 mg total) by mouth daily at 6 PM.  . calcium citrate (CALCITRATE - DOSED IN MG ELEMENTAL CALCIUM) 950 MG tablet Take 1 tablet (200 mg of elemental calcium total) by mouth 2 (two) times daily.  . cholecalciferol 2000 units TABS Take 1 tablet (2,000 Units total) by mouth daily.  . divalproex (DEPAKOTE ER) 500 MG 24 hr tablet Take 500 mg by mouth at bedtime.  . docusate sodium (COLACE) 100 MG capsule Take 1 capsule (100 mg total) by mouth 2 (two) times daily.  Marland Kitchen escitalopram (LEXAPRO) 10 MG tablet Take 10 mg by mouth daily.   Marland Kitchen lisinopril (PRINIVIL,ZESTRIL) 5 MG tablet Take 2.5 mg by mouth daily.   . meloxicam (MOBIC) 7.5 MG tablet Take 7.5 mg by mouth every 12 (twelve) hours.   . NUTRITIONAL SUPPLEMENT LIQD Take 5 mg by mouth daily. MedPass  . vitamin C (VITAMIN C) 500 MG tablet Take 1 tablet (500 mg total) by mouth daily.  . [DISCONTINUED] oxycodone (OXY-IR) 5 MG capsule Take 5 mg by mouth every 6 (six) hours as needed for pain.   No facility-administered encounter medications on file as of 06/12/2017.  Review of Systems  Unable to obtain due to dementia   Immunization History  Administered Date(s) Administered  . Tdap 08/22/2016   Pertinent  Health Maintenance Due  Topic Date Due  . PNA vac Low Risk Adult (1 of 2 - PCV13) 06/22/2001  . INFLUENZA VACCINE  08/28/2017   Fall Risk  06/10/2017  Falls in the past year? Yes  Number falls in past yr: 1  Injury with Fall? Yes  Comment pt broke right femur last month  Risk Factor Category  High Fall Risk        Vitals:   06/12/17 0953  BP: 107/63  Pulse: 71  Resp: 20  Temp: (!) 97.3 F (36.3 C)  TempSrc: Oral  SpO2: 96%  Weight: 155 lb 3.2  oz (70.4 kg)  Height:  (1.676 m)   Body mass index is 25.05 kg/m.  Physical Exam  GENERAL APPEARANCE: Well nourished. In no acute distress. Normal body habitus SKIN:  Left thigh surgical incision is healed.  MOUTH and THROAT: Lips are without lesions. Oral mucosa is moist and without lesions. Tongue is normal in shape, size, and color and without lesions RESPIRATORY: Breathing is even & unlabored, BS CTAB CARDIAC: RRR, no murmur,no extra heart sounds, no edema GI: Abdomen soft, normal BS, no masses, no tenderness EXTREMITIES:  Able to move x 4 extremities, left-sided weakness PSYCHIATRIC: Alert to self, disoriented to time and place. Affect and behavior are appropriate   Labs reviewed: Recent Labs    05/12/17 0429 05/13/17 0358 05/15/17 0421 05/16/17 0734  NA 140 139 136  --   K 4.7 4.8 4.1  --   CL 105 107 101  --   CO2 --   GLUCOSE 108* 109* 98  --   BUN 21* 19 14  --   CREATININE 1.36* 0.91 0.83  --   CALCIUM 8.8* 8.5* 8.2*  8.2*  --   MG  --   --  1.9 1.9  PHOS  --   --  2.2* 2.9   Recent Labs    05/12/17 0429 05/15/17 0421 05/16/17 0734  AST 99* 64* 60*  ALT 32 30 32  ALKPHOS 48 37* 41  BILITOT 0.8 0.9 0.9  PROT 6.6 5.8* 5.5*  ALBUMIN 3.0* 2.5* 2.2*   Recent Labs    08/21/16 2200 05/11/17 1135  05/14/17 0338 05/15/17 0421 05/16/17 0734  WBC 8.2 12.6*   < > 8.1 10.3 11.9*  NEUTROABS 6.1 10.9*  --  7.1  --   --   HGB 12.5* 11.7*   < > 7.5* 9.3* 9.3*  HCT 37.4* 34.8*   < > 22.9* 28.0* 29.1*  MCV 79.2 80.6   < > 83.6 82.8 84.1  PLT 252 203   < > 137* 118* 166   < > = values in this interval not displayed.   Lab Results  Component Value Date   TSH 1.845 05/15/2017   Lab Results  Component Value Date   HGBA1C 5.8 (H) 05/12/2017   Lab Results  Component Value Date   CHOL 177 05/12/2017   HDL 72 05/12/2017   LDLCALC 91 05/12/2017   TRIG 69 05/12/2017   CHOLHDL 2.5 05/12/2017    Significant Diagnostic Results in last 30 days:   Ct Angio Head W Or Wo Contrast  Result Date: 05/13/2017 CLINICAL DATA:  Stroke follow-up EXAM: CT ANGIOGRAPHY HEAD AND NECK TECHNIQUE: Multidetector CT imaging of the head and neck was performed using the  standard protocol during bolus administration of intravenous contrast. Multiplanar CT image reconstructions and MIPs were obtained to evaluate the vascular anatomy. Carotid stenosis measurements (when applicable) are obtained utilizing NASCET criteria, using the distal internal carotid diameter as the denominator. CONTRAST:  50mL ISOVUE-370 IOPAMIDOL (ISOVUE-370) INJECTION 76% COMPARISON:  Head CT 05/11/2017 FINDINGS: CT HEAD FINDINGS BRAIN: No mass lesion, intraparenchymal hemorrhage or extra-axial collection. No evidence of acute cortical infarct. Extensive chronic ischemic white matter disease and encephalomalacia throughout much of the left hemisphere. Wallerian degeneration at the left cerebral peduncle. VASCULAR: No hyperdense vessel or unexpected vascular calcification. SKULL: Normal visualized skull base, calvarium and extracranial soft tissues. SINUSES/ORBITS: No sinus fluid levels or advanced mucosal thickening. No mastoid effusion. Normal orbits. CTA NECK FINDINGS AORTIC ARCH: There is no calcific atherosclerosis of the aortic arch. There is no aneurysm, dissection or hemodynamically significant stenosis of the visualized ascending aorta and aortic arch. Conventional 3 vessel aortic branching pattern. The visualized proximal subclavian arteries are widely patent. RIGHT CAROTID SYSTEM: --Common carotid artery: Widely patent origin without common carotid artery dissection or aneurysm. --Internal carotid artery: No dissection, occlusion or aneurysm. No hemodynamically significant stenosis. --External carotid artery: No acute abnormality. LEFT CAROTID SYSTEM: --Common carotid artery: Widely patent origin without common carotid artery dissection or aneurysm. --Internal carotid artery:No dissection,  occlusion or aneurysm. No hemodynamically significant stenosis. --External carotid artery: No acute abnormality. VERTEBRAL ARTERIES: Left dominant configuration. Both origins are normal. Right vertebral artery terminates in PICA. There is predominantly noncalcified plaque in the V4 segment of the left vertebral artery. SKELETON: There is no bony spinal canal stenosis. No lytic or blastic lesion. OTHER NECK: Fluid within the distal cervical and proximal thoracic esophagus. UPPER CHEST: No pneumothorax or pleural effusion. No nodules or masses. CTA HEAD FINDINGS ANTERIOR CIRCULATION: --Intracranial internal carotid arteries: Normal. --Anterior cerebral arteries: Normal. Both A1 segments are present. Patent anterior communicating artery. --Middle cerebral arteries: Normal. --Posterior communicating arteries: Absent bilaterally. POSTERIOR CIRCULATION: --Basilar artery: Normal. --Posterior cerebral arteries: Normal. --Superior cerebellar arteries: Normal. --Inferior cerebellar arteries: Normal variant pattern with the right vertebral artery terminating in PICA. VENOUS SINUSES: As permitted by contrast timing, patent. ANATOMIC VARIANTS: None DELAYED PHASE: No parenchymal contrast enhancement. Review of the MIP images confirms the above findings. IMPRESSION: 1. No emergent large vessel occlusion or flow-limiting stenosis. 2. Focal noncalcified atherosclerotic plaque in the V4 segment of the left vertebral artery causing slightly less than 50% stenosis. 3. Multiple old infarcts and extensive cerebral volume loss. Electronically Signed   By: Deatra Robinson M.D.   On: 05/13/2017 20:03   Ct Angio Neck W Or Wo Contrast  Result Date: 05/13/2017 CLINICAL DATA:  Stroke follow-up EXAM: CT ANGIOGRAPHY HEAD AND NECK TECHNIQUE: Multidetector CT imaging of the head and neck was performed using the standard protocol during bolus administration of intravenous contrast. Multiplanar CT image reconstructions and MIPs were obtained to  evaluate the vascular anatomy. Carotid stenosis measurements (when applicable) are obtained utilizing NASCET criteria, using the distal internal carotid diameter as the denominator. CONTRAST:  50mL ISOVUE-370 IOPAMIDOL (ISOVUE-370) INJECTION 76% COMPARISON:  Head CT 05/11/2017 FINDINGS: CT HEAD FINDINGS BRAIN: No mass lesion, intraparenchymal hemorrhage or extra-axial collection. No evidence of acute cortical infarct. Extensive chronic ischemic white matter disease and encephalomalacia throughout much of the left hemisphere. Wallerian degeneration at the left cerebral peduncle. VASCULAR: No hyperdense vessel or unexpected vascular calcification. SKULL: Normal visualized skull base, calvarium and extracranial soft tissues. SINUSES/ORBITS: No sinus fluid levels or advanced mucosal thickening. No mastoid  effusion. Normal orbits. CTA NECK FINDINGS AORTIC ARCH: There is no calcific atherosclerosis of the aortic arch. There is no aneurysm, dissection or hemodynamically significant stenosis of the visualized ascending aorta and aortic arch. Conventional 3 vessel aortic branching pattern. The visualized proximal subclavian arteries are widely patent. RIGHT CAROTID SYSTEM: --Common carotid artery: Widely patent origin without common carotid artery dissection or aneurysm. --Internal carotid artery: No dissection, occlusion or aneurysm. No hemodynamically significant stenosis. --External carotid artery: No acute abnormality. LEFT CAROTID SYSTEM: --Common carotid artery: Widely patent origin without common carotid artery dissection or aneurysm. --Internal carotid artery:No dissection, occlusion or aneurysm. No hemodynamically significant stenosis. --External carotid artery: No acute abnormality. VERTEBRAL ARTERIES: Left dominant configuration. Both origins are normal. Right vertebral artery terminates in PICA. There is predominantly noncalcified plaque in the V4 segment of the left vertebral artery. SKELETON: There is no bony  spinal canal stenosis. No lytic or blastic lesion. OTHER NECK: Fluid within the distal cervical and proximal thoracic esophagus. UPPER CHEST: No pneumothorax or pleural effusion. No nodules or masses. CTA HEAD FINDINGS ANTERIOR CIRCULATION: --Intracranial internal carotid arteries: Normal. --Anterior cerebral arteries: Normal. Both A1 segments are present. Patent anterior communicating artery. --Middle cerebral arteries: Normal. --Posterior communicating arteries: Absent bilaterally. POSTERIOR CIRCULATION: --Basilar artery: Normal. --Posterior cerebral arteries: Normal. --Superior cerebellar arteries: Normal. --Inferior cerebellar arteries: Normal variant pattern with the right vertebral artery terminating in PICA. VENOUS SINUSES: As permitted by contrast timing, patent. ANATOMIC VARIANTS: None DELAYED PHASE: No parenchymal contrast enhancement. Review of the MIP images confirms the above findings. IMPRESSION: 1. No emergent large vessel occlusion or flow-limiting stenosis. 2. Focal noncalcified atherosclerotic plaque in the V4 segment of the left vertebral artery causing slightly less than 50% stenosis. 3. Multiple old infarcts and extensive cerebral volume loss. Electronically Signed   By: Deatra Robinson M.D.   On: 05/13/2017 20:03   Dg C-arm 1-60 Min  Result Date: 05/13/2017 CLINICAL DATA:  Left femur fracture ORIF. EXAM: DG C-ARM 61-120 MIN; LEFT FEMUR 2 VIEWS COMPARISON:  Left femur x-rays dated May 11, 2017. FINDINGS: Multiple intraoperative x-rays demonstrate interval cephalomedullary rod fixation of the left proximal femur fracture. Alignment is near anatomic. No hardware complication. Unchanged avulsion fracture of the lesser trochanter. IMPRESSION: 1. Left proximal femur fracture ORIF.  Alignment is near anatomic. 2. Unchanged lesser trochanteric avulsion fracture. FLUOROSCOPY TIME:  99 seconds. C-arm fluoroscopic images were obtained intraoperatively and submitted for post operative interpretation.  Electronically Signed   By: Obie Dredge M.D.   On: 05/13/2017 13:14   Dg C-arm 1-60 Min  Result Date: 05/13/2017 CLINICAL DATA:  Left femur fracture ORIF. EXAM: DG C-ARM 61-120 MIN; LEFT FEMUR 2 VIEWS COMPARISON:  Left femur x-rays dated May 11, 2017. FINDINGS: Multiple intraoperative x-rays demonstrate interval cephalomedullary rod fixation of the left proximal femur fracture. Alignment is near anatomic. No hardware complication. Unchanged avulsion fracture of the lesser trochanter. IMPRESSION: 1. Left proximal femur fracture ORIF.  Alignment is near anatomic. 2. Unchanged lesser trochanteric avulsion fracture. FLUOROSCOPY TIME:  99 seconds. C-arm fluoroscopic images were obtained intraoperatively and submitted for post operative interpretation. Electronically Signed   By: Obie Dredge M.D.   On: 05/13/2017 13:14   Dg Femur Min 2 Views Left  Result Date: 05/13/2017 CLINICAL DATA:  83 y/o  M; postop left femur. EXAM: LEFT FEMUR 2 VIEWS COMPARISON:  05/11/2017 left proximal femur radiograph. FINDINGS: Left proximal femoral diaphysis acute fracture post nail fixation with near anatomic alignment. Medially displaced fracture of lesser trochanter. No  hip dislocation. No new fracture identified. 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 Mild edema and air within the surrounding soft tissues. IMPRESSION: 1. Left proximal femoral diaphysis acute fracture post nail fixation with near anatomic alignment. Expected postsurgical changes in soft tissues. 2. Mild left hip and severe left knee osteoarthrosis. Electronically Signed   By: Mitzi Hansen M.D.   On: 05/13/2017 20:56   Dg Femur Min 2 Views Left  Result Date: 05/13/2017 CLINICAL DATA:  Left femur fracture ORIF. EXAM: DG C-ARM 61-120 MIN; LEFT FEMUR 2 VIEWS COMPARISON:  Left femur x-rays dated May 11, 2017. FINDINGS: Multiple intraoperative x-rays demonstrate interval  cephalomedullary rod fixation of the left proximal femur fracture. Alignment is near anatomic. No hardware complication. Unchanged avulsion fracture of the lesser trochanter. IMPRESSION: 1. Left proximal femur fracture ORIF.  Alignment is near anatomic. 2. Unchanged lesser trochanteric avulsion fracture. FLUOROSCOPY TIME:  99 seconds. C-arm fluoroscopic images were obtained intraoperatively and submitted for post operative interpretation. Electronically Signed   By: Obie Dredge M.D.   On: 05/13/2017 13:14    Assessment/Plan  1. Hypotension, unspecified hypotension type - BPs has been noted to be low, will discontinue Lisinopril 2.5 mg, check BP BID X 1 week   2. Closed fracture of left femur, unspecified fracture morphology, unspecified portion of femur, sequela - left thigh surgical incisions are healed, continue Meloxicam 7.5 mg 1 tab Q 12 hours PRN and Acetaminophen 325 mg 2 tabs = 650 mg Q 6 hours PRN for pain, continue rehabilitation with PT and OT for therapeutic strengthening exercises, fall precautions   3. History of CVA (cerebrovascular accident) - stable, BPs has been low so Lisinopril 2.5 mg will be discontinued, continue ASA 325 mg daily, Atorvastatin 80 mg daily   4. Mood disorder (HCC) - mood has been stable, continue Divalproex Sodium ER 500 mg 1 tab Q HS   5. Depression, recurrent (HCC) - continue Escitalopram 10 mg daily   6. Vascular dementia with behavior disturbance - continue supportive care, fall precautions    Family/ staff Communication: Discussed plan of care with resident and charge nurse.  Labs/tests ordered:  CBC and BMP  Goals of care:   Short-term care   Kenard Gower, NP [facility] [phone] (Monday-Friday 8:00 a.m. - 5:00 p.m.) [phone] (after hours)

## 2017-06-13 LAB — CBC AND DIFFERENTIAL
HCT: 33 — AB (ref 41–53)
Hemoglobin: 10.6 — AB (ref 13.5–17.5)
NEUTROS ABS: 5
PLATELETS: 203 (ref 150–399)
WBC: 8.1

## 2017-06-13 LAB — BASIC METABOLIC PANEL
BUN: 13 (ref 4–21)
CREATININE: 0.8 (ref 0.6–1.3)
GLUCOSE: 91
Potassium: 4.6 (ref 3.4–5.3)
SODIUM: 136 — AB (ref 137–147)

## 2017-06-25 DIAGNOSIS — S7222XD Displaced subtrochanteric fracture of left femur, subsequent encounter for closed fracture with routine healing: Secondary | ICD-10-CM | POA: Diagnosis not present

## 2017-07-10 ENCOUNTER — Non-Acute Institutional Stay (SKILLED_NURSING_FACILITY): Payer: Medicare Other | Admitting: Adult Health

## 2017-07-10 ENCOUNTER — Encounter: Payer: Self-pay | Admitting: Adult Health

## 2017-07-10 DIAGNOSIS — G459 Transient cerebral ischemic attack, unspecified: Secondary | ICD-10-CM | POA: Diagnosis not present

## 2017-07-10 DIAGNOSIS — F0151 Vascular dementia with behavioral disturbance: Secondary | ICD-10-CM | POA: Diagnosis not present

## 2017-07-10 DIAGNOSIS — S7292XS Unspecified fracture of left femur, sequela: Secondary | ICD-10-CM | POA: Diagnosis not present

## 2017-07-10 DIAGNOSIS — F339 Major depressive disorder, recurrent, unspecified: Secondary | ICD-10-CM | POA: Diagnosis not present

## 2017-07-10 DIAGNOSIS — F01518 Vascular dementia, unspecified severity, with other behavioral disturbance: Secondary | ICD-10-CM

## 2017-07-10 DIAGNOSIS — R569 Unspecified convulsions: Secondary | ICD-10-CM

## 2017-07-10 NOTE — Progress Notes (Signed)
Location:  [facility] Room Number: 303 Place of Service:  SNF (31) Provider:  Kenard Gower, NP  Patient Care Team: Administration, Veterans as PCP - General  Extended Emergency Contact Information Primary Emergency Contact: Ilda Basset [place] Mobile Phone: [phone] Relation: Daughter Secondary Emergency Contact: Rose,Eloise Address: [address]          [place] [address] [place] Home Phone: [phone] Relation: Sister  Code Status:  Full Code  Goals of care: Advanced Directive information Advanced Directives 05/12/2017  Does Patient Have a Medical Advance Directive? No  Would patient like information on creating a medical advance directive? No - Patient declined     Chief Complaint  Patient presents with  . Medical Management of Chronic Issues    The patient is seen for a routine [facility] visit    HPI:  Pt is an 83 y.o. male seen today for medical management of chronic diseases.  He is a short-term rehabilitation resident of [facility].  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 He was seen in the room today. He was seen cleaning his floor using picker stick and his dirty clothes.   He has been admitted to [facility] on 05/16/17 from a recent hospitalization for left subtrochanteric femur fracture S/P intramedullary nail/ORIF. He apparently fell at home and sustained  A left subtrochanteric femur fracture.     Past Medical History:  Diagnosis Date  . Asthma   . Dementia   . Hypertension   . Seizures (HCC)   . Stroke [facility])    Past Surgical History:  Procedure Laterality Date  . BACK SURGERY    . FEMUR IM NAIL Left 05/13/2017   Procedure: INTRAMEDULLARY (IM) NAIL FEMORAL;  Surgeon: [name], MD;  Location: [facility]  OR;  Service: Orthopedics;  Laterality: Left;  . KNEE SURGERY      Allergies  Allergen Reactions  . Penicillins Hives and Swelling    Patient couldn't answer any questions    Outpatient Encounter Medications as of 07/10/2017  Medication Sig  . acetaminophen (TYLENOL) 325 MG tablet Take 2 tablets (650 mg total) by mouth every 6 (six) hours as needed for mild pain or moderate pain (or temp > 37.5 C (99.5 F)).  Marland Kitchen aspirin 325 MG tablet Take 1 tablet (325 mg total) by mouth daily.  Marland Kitchen atorvastatin (LIPITOR) 80 MG tablet Take 1 tablet (80 mg total) by mouth daily at 6 PM.  . calcium citrate (CALCITRATE - DOSED IN MG ELEMENTAL CALCIUM) 950 MG tablet Take 1 tablet (200 mg of elemental calcium total) by mouth 2 (two) times daily.  . cholecalciferol 2000 units TABS Take 1 tablet (2,000 Units total) by mouth daily.  . divalproex (DEPAKOTE ER) 500 MG 24 hr tablet Take 500 mg by mouth at bedtime.  . docusate sodium (COLACE) 100 MG capsule Take 1 capsule (100 mg total) by mouth 2 (two) times daily.  Marland Kitchen escitalopram (LEXAPRO) 10 MG tablet Take 10 mg by mouth daily.   . meloxicam (MOBIC) 7.5 MG tablet Take 7.5 mg by mouth every 12 (twelve) hours.   . NUTRITIONAL SUPPLEMENT LIQD Take 5 mg by mouth daily. MedPass  . vitamin C (VITAMIN C) 500 MG tablet Take 1 tablet (500 mg total) by mouth daily.  . [DISCONTINUED] lisinopril (PRINIVIL,ZESTRIL) 5 MG tablet Take 2.5 mg by  mouth daily.    No facility-administered encounter medications on file as of 07/10/2017.     Review of Systems  GENERAL: No change in appetite, no fatigue, no weight changes, no fever, chills or weakness MOUTH and THROAT: Denies oral discomfort, gingival pain or bleeding, pain from teeth or hoarseness   RESPIRATORY: no cough, SOB, DOE, wheezing, hemoptysis CARDIAC: No chest pain, edema or palpitations GI: No abdominal pain, diarrhea, constipation, heart burn, nausea or vomiting GU: Denies dysuria, frequency, hematuria, incontinence, or  discharge PSYCHIATRIC: Denies feelings of depression or anxiety. No report of hallucinations, insomnia, paranoia, or agitation    Immunization History  Administered Date(s) Administered  . Tdap 08/22/2016   Pertinent  Health Maintenance Due  Topic Date Due  . PNA vac Low Risk Adult (1 of 2 - PCV13) 06/22/2001  . INFLUENZA VACCINE  08/28/2017   Fall Risk  06/10/2017  Falls in the past year? Yes  Number falls in past yr: 1  Injury with Fall? Yes  Comment pt broke right femur last month  Risk Factor Category  High Fall Risk    Vitals:   07/10/17 1125  BP: 134/78  Pulse: 78  Resp: 19  Temp: 98.2 F (36.8 C)  TempSrc: Oral  SpO2: 96%  Weight: 154 lb 3.2 oz (69.9 kg)  Height: 5\' 6"  (1.676 m)   Body mass index is 24.89 kg/m.  Physical Exam  GENERAL APPEARANCE: Well nourished. In no acute distress. Normal body habitus SKIN:  Skin is warm and dry.  MOUTH and THROAT: Lips are without lesions. Oral mucosa is moist and without lesions. Tongue is normal in shape, size, and color and without lesions RESPIRATORY: Breathing is even & unlabored, BS CTAB CARDIAC: RRR, no murmur,no extra heart sounds, no edema GI: Abdomen soft, normal BS, no masses, no tenderness EXTREMITIES:  Able to move X 4 extremities PSYCHIATRIC: Alert to self, disoriented to time and place.  Affect and behavior are appropriate   Labs reviewed: Recent Labs    05/12/17 0429 05/13/17 0358 05/15/17 0421 05/16/17 0734 06/13/17  NA 140 139 136  --  136*  K 4.7 4.8 4.1  --  4.6  CL 105 107 101  --   --   CO2 24 24 28   --   --   GLUCOSE 108* 109* 98  --   --   BUN 21* 19 14  --  13  CREATININE 1.36* 0.91 0.83  --  0.8  CALCIUM 8.8* 8.5* 8.2*  8.2*  --   --   MG  --   --  1.9 1.9  --   PHOS  --   --  2.2* 2.9  --    Recent Labs    05/12/17 0429 05/15/17 0421 05/16/17 0734  AST 99* 64* 60*  ALT 32 30 32  ALKPHOS 48 37* 41  BILITOT 0.8 0.9 0.9  PROT 6.6 5.8* 5.5*  ALBUMIN 3.0* 2.5* 2.2*   Recent  Labs    05/11/17 1135  05/14/17 0338 05/15/17 0421 05/16/17 0734 06/13/17  WBC 12.6*   < > 8.1 10.3 11.9* 8.1  NEUTROABS 10.9*  --  7.1  --   --  5  HGB 11.7*   < > 7.5* 9.3* 9.3* 10.6*  HCT 34.8*   < > 22.9* 28.0* 29.1* 33*  MCV 80.6   < > 83.6 82.8 84.1  --   PLT 203   < > 137* 118* 166 203   < > = values in  this interval not displayed.   Lab Results  Component Value Date   TSH 1.845 05/15/2017   Lab Results  Component Value Date   HGBA1C 5.8 (H) 05/12/2017   Lab Results  Component Value Date   CHOL 177 05/12/2017   HDL 72 05/12/2017   LDLCALC 91 05/12/2017   TRIG 69 05/12/2017   CHOLHDL 2.5 05/12/2017    Assessment/Plan  1. Closed fracture of left femur, unspecified fracture morphology, unspecified portion of femur, sequela - S/P ORIF, continue  Meloxicam 7.5 mg Q 12 hours PRN for muscle spasm, and Acetaminophen PRN for pain   2. TIA (transient ischemic attack) - stable, continue ASA 81 mg daily and Atorvastatin 80 mg daily   3. Seizures (HCC) - no recent seizure, continue Divalproex ER 500 mg Q HS   4. Depression, recurrent (HCC) - mood is stable, continue Escitalopram 10 mg daily   5. Vascular dementia with behavior disturbance - continue supportive care, fall precautions     Family/ staff Communication: Discussed plan of care with resident.  Labs/tests ordered:  None  Goals of care:   Short-term care   Kenard GowerMonina Medina-Vargas, NP [facility] [phone] (Monday-Friday 8:00 a.m. - 5:00 p.m.) [phone] (after hours)

## 2017-07-15 DIAGNOSIS — F419 Anxiety disorder, unspecified: Secondary | ICD-10-CM | POA: Diagnosis not present

## 2017-07-22 DIAGNOSIS — F0391 Unspecified dementia with behavioral disturbance: Secondary | ICD-10-CM | POA: Diagnosis not present

## 2017-07-22 DIAGNOSIS — F419 Anxiety disorder, unspecified: Secondary | ICD-10-CM | POA: Diagnosis not present

## 2017-08-01 ENCOUNTER — Encounter: Payer: Self-pay | Admitting: Adult Health

## 2017-08-01 ENCOUNTER — Non-Acute Institutional Stay (SKILLED_NURSING_FACILITY): Payer: Medicare Other | Admitting: Adult Health

## 2017-08-01 DIAGNOSIS — Z8673 Personal history of transient ischemic attack (TIA), and cerebral infarction without residual deficits: Secondary | ICD-10-CM

## 2017-08-01 DIAGNOSIS — R4689 Other symptoms and signs involving appearance and behavior: Secondary | ICD-10-CM | POA: Diagnosis not present

## 2017-08-01 DIAGNOSIS — R569 Unspecified convulsions: Secondary | ICD-10-CM

## 2017-08-01 DIAGNOSIS — F32A Depression, unspecified: Secondary | ICD-10-CM

## 2017-08-01 DIAGNOSIS — F329 Major depressive disorder, single episode, unspecified: Secondary | ICD-10-CM

## 2017-08-01 NOTE — Progress Notes (Signed)
Location:  [facility] Room Number: 303-A Place of Service:  SNF (31) Provider:  Kenard Gower, NP  Patient Care Team: Administration, Veterans as PCP - General  Extended Emergency Contact Information Primary Emergency Contact: Ilda Basset [place] Mobile Phone: [phone] Relation: Daughter Secondary Emergency Contact: Rose,Eloise Address: [address]          [place] [address] [place] Home Phone: [phone] Relation: Sister  Code Status:  Full Code  Goals of care: Advanced Directive information Advanced Directives 05/12/2017  Does Patient Have a Medical Advance Directive? No  Would patient like information on creating a medical advance directive? No - Patient declined     Chief Complaint  Patient presents with  . Medical Management of Chronic Issues    The patient is seen for a routine [facility] visit    HPI:  Pt is an 83 y.o. male seen today for medical management of chronic diseases.  He is a short-term care rehabilitation resident of [facility].  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 He was reported to have been yelling at staffs especially during 3rd shift. 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 He was seen in his room and was pleasant during the visit.    Past Medical History:  Diagnosis Date  . Asthma   . Dementia   . Hypertension   . Seizures (HCC)   . Stroke [facility])    Past Surgical History:  Procedure Laterality Date  . BACK SURGERY    . FEMUR IM NAIL Left 05/13/2017   Procedure: INTRAMEDULLARY (IM) NAIL FEMORAL;  Surgeon: [name], MD;  Location: [facility] OR;  Service: Orthopedics;  Laterality: Left;  . KNEE SURGERY      Allergies  Allergen Reactions  . Penicillins Hives and Swelling    Patient couldn't  answer any questions    Outpatient Encounter Medications as of 08/01/2017  Medication Sig  . acetaminophen (TYLENOL) 325 MG tablet Take 2 tablets (650 mg total) by mouth every 6 (six) hours as needed for mild pain or moderate pain (or temp > 37.5 C (99.5 F)).  Marland Kitchen aspirin 325 MG tablet Take 1 tablet (325 mg total) by mouth daily.  Marland Kitchen atorvastatin (LIPITOR) 80 MG tablet Take 1 tablet (80 mg total) by mouth daily at 6 PM.  . calcium citrate (CALCITRATE - DOSED IN MG ELEMENTAL CALCIUM) 950 MG tablet Take 1 tablet (200 mg of elemental calcium total) by mouth 2 (two) times daily.  . cholecalciferol 2000 units TABS Take 1 tablet (2,000 Units total) by mouth daily.  . divalproex (DEPAKOTE ER) 500 MG 24 hr tablet Take 500 mg by mouth at bedtime.   . docusate sodium (COLACE) 100 MG capsule Take 1 capsule (100 mg total) by mouth 2 (two) times daily.  Marland Kitchen escitalopram (LEXAPRO) 10 MG tablet Take 10 mg by mouth daily.   . meloxicam (MOBIC) 7.5 MG tablet Take 7.5 mg by mouth every 12 (twelve) hours.   . NUTRITIONAL SUPPLEMENT LIQD Take 120 mLs by mouth daily. MedPass   . vitamin C (VITAMIN C) 500 MG tablet Take 1 tablet (500 mg total) by mouth daily.   No facility-administered encounter medications on file as of 08/01/2017.     Review of Systems  GENERAL: No change in appetite, no fatigue, no weight changes, no fever, chills or weakness  MOUTH and THROAT: Denies oral discomfort, gingival pain or bleeding, pain from teeth or hoarseness   RESPIRATORY: no cough, SOB, DOE, wheezing, hemoptysis CARDIAC: No chest pain, edema or palpitations GI: No abdominal pain, diarrhea, constipation, heart burn, nausea or vomiting GU: Denies dysuria, frequency, hematuria, or discharge PSYCHIATRIC: Denies feelings of depression or anxiety. No report of hallucinations, insomnia, paranoia, or agitation   Immunization History  Administered Date(s) Administered  . Tdap 08/22/2016   Pertinent  Health Maintenance Due  Topic Date  Due  . PNA vac Low Risk Adult (1 of 2 - PCV13) 06/22/2001  . INFLUENZA VACCINE  08/28/2017   Fall Risk  06/10/2017  Falls in the past year? Yes  Number falls in past yr: 1  Injury with Fall? Yes  Comment pt broke right femur last month  Risk Factor Category  High Fall Risk     Vitals:   08/01/17 1404  BP: 130/74  Pulse: 68  Resp: 18  Temp: (!) 97.2 F (36.2 C)  TempSrc: Oral  SpO2: 97%  Weight: 154 lb 3.2 oz (69.9 kg)  Height: 5\' 6"  (1.676 m)   Body mass index is 24.89 kg/m.  Physical Exam  GENERAL APPEARANCE: Well nourished. In no acute distress. Normal body habitus SKIN:  Skin is warm and dry.  MOUTH and THROAT: Lips are without lesions. Oral mucosa is moist and without lesions.  RESPIRATORY: Breathing is even & unlabored, BS CTAB CARDIAC: RRR, no murmur,no extra heart sounds, no edema GI: Abdomen soft, normal BS, no masses, no tenderness EXTREMITIES:  Able to move X 4 extremities, right -sided weakness: PSYCHIATRIC: Alert to self, disoriented to time and place. Affect and behavior are appropriate   Labs reviewed: Recent Labs    05/12/17 0429 05/13/17 0358 05/15/17 0421 05/16/17 0734 06/13/17  NA 140 139 136  --  136*  K 4.7 4.8 4.1  --  4.6  CL 105 107 101  --   --   CO2 24 24 28   --   --   GLUCOSE 108* 109* 98  --   --   BUN 21* 19 14  --  13  CREATININE 1.36* 0.91 0.83  --  0.8  CALCIUM 8.8* 8.5* 8.2*  8.2*  --   --   MG  --   --  1.9 1.9  --   PHOS  --   --  2.2* 2.9  --    Recent Labs    05/12/17 0429 05/15/17 0421 05/16/17 0734  AST 99* 64* 60*  ALT 32 30 32  ALKPHOS 48 37* 41  BILITOT 0.8 0.9 0.9  PROT 6.6 5.8* 5.5*  ALBUMIN 3.0* 2.5* 2.2*   Recent Labs    05/11/17 1135  05/14/17 0338 05/15/17 0421 05/16/17 0734 06/13/17  WBC 12.6*   < > 8.1 10.3 11.9* 8.1  NEUTROABS 10.9*  --  7.1  --   --  5  HGB 11.7*   < > 7.5* 9.3* 9.3* 10.6*  HCT 34.8*   < > 22.9* 28.0* 29.1* 33*  MCV 80.6   < > 83.6 82.8 84.1  --   PLT 203   < > 137*  118* 166 203   < > = values in this interval not displayed.   Lab Results  Component Value Date   TSH 1.845 05/15/2017   Lab Results  Component Value Date   HGBA1C 5.8 (H) 05/12/2017   Lab Results  Component Value Date   CHOL 177 05/12/2017  HDL 72 05/12/2017   LDLCALC 91 05/12/2017   TRIG 69 05/12/2017   CHOLHDL 2.5 05/12/2017    Assessment/Plan  1. Aggression - will refer for psych consult, explain procedure first before assisting with ADLs   2. History of stroke - stable, continue ASA 325 mg daily   3. Seizures (HCC) - no recent seizure episode, continue Depakote ER 500 mg Q HS   4. Chronic depression - has aggressive episodes, will refer for psych consult, continue Escitalopram 10 mg daily    Family/ staff Communication:  Discussed plan of care with resident.  Labs/tests ordered: None  Goals of care:  Short-term care  Kenard GowerMonina Medina-Vargas, NP [facility] [phone] (Monday-Friday 8:00 a.m. - 5:00 p.m.) [phone] (after hours)

## 2017-08-06 DIAGNOSIS — S7222XD Displaced subtrochanteric fracture of left femur, subsequent encounter for closed fracture with routine healing: Secondary | ICD-10-CM | POA: Diagnosis not present

## 2017-08-20 ENCOUNTER — Non-Acute Institutional Stay (SKILLED_NURSING_FACILITY): Payer: Medicare Other | Admitting: Adult Health

## 2017-08-20 ENCOUNTER — Encounter: Payer: Self-pay | Admitting: Adult Health

## 2017-08-20 DIAGNOSIS — F419 Anxiety disorder, unspecified: Secondary | ICD-10-CM | POA: Diagnosis not present

## 2017-08-20 DIAGNOSIS — S7292XS Unspecified fracture of left femur, sequela: Secondary | ICD-10-CM

## 2017-08-20 DIAGNOSIS — F339 Major depressive disorder, recurrent, unspecified: Secondary | ICD-10-CM | POA: Diagnosis not present

## 2017-08-20 DIAGNOSIS — F39 Unspecified mood [affective] disorder: Secondary | ICD-10-CM | POA: Diagnosis not present

## 2017-08-20 DIAGNOSIS — F0391 Unspecified dementia with behavioral disturbance: Secondary | ICD-10-CM | POA: Diagnosis not present

## 2017-08-20 DIAGNOSIS — I10 Essential (primary) hypertension: Secondary | ICD-10-CM | POA: Diagnosis not present

## 2017-08-20 DIAGNOSIS — R569 Unspecified convulsions: Secondary | ICD-10-CM | POA: Diagnosis not present

## 2017-08-20 DIAGNOSIS — Z8673 Personal history of transient ischemic attack (TIA), and cerebral infarction without residual deficits: Secondary | ICD-10-CM | POA: Diagnosis not present

## 2017-08-20 MED ORDER — CHOLECALCIFEROL 50 MCG (2000 UT) PO TABS
2000.0000 [IU] | ORAL_TABLET | Freq: Every day | ORAL | 0 refills | Status: DC
Start: 2017-08-20 — End: 2019-12-20

## 2017-08-20 MED ORDER — ESCITALOPRAM OXALATE 10 MG PO TABS: 10.0000 mg | ORAL_TABLET | Freq: Every day | ORAL | 0 refills | Status: DC

## 2017-08-20 MED ORDER — CALCIUM CITRATE 950 (200 CA) MG PO TABS: 200.0000 mg | ORAL_TABLET | Freq: Two times a day (BID) | ORAL | 0 refills | Status: DC

## 2017-08-20 MED ORDER — MELOXICAM 7.5 MG PO TABS: 7.5000 mg | ORAL_TABLET | Freq: Two times a day (BID) | ORAL | 0 refills | Status: DC

## 2017-08-20 MED ORDER — DIVALPROEX SODIUM ER 500 MG PO TB24
500.0000 mg | ORAL_TABLET | Freq: Every day | ORAL | 0 refills | Status: DC
Start: 2017-08-20 — End: 2019-12-20

## 2017-08-20 MED ORDER — ATORVASTATIN CALCIUM 80 MG PO TABS: 80.0000 mg | ORAL_TABLET | Freq: Every day | ORAL | 0 refills | Status: AC

## 2017-08-20 NOTE — Progress Notes (Signed)
Location:  [facility] Room Number: 303-A Place of Service:  SNF (31) Provider:  Kenard Gower, NP  Patient Care Team: Administration, Veterans as PCP - General  Extended Emergency Contact Information Primary Emergency Contact: Ilda Basset [place] Mobile Phone: [phone] Relation: Daughter Secondary Emergency Contact: Rose,Eloise Address: [address]          [place] [address] [place] Home Phone: [phone] Relation: Sister  Code Status:  Full Code  Goals of care: Advanced Directive information Advanced Directives 05/12/2017  Does Patient Have a Medical Advance Directive? No  Would patient like information on creating a medical advance directive? No - Patient declined     Chief Complaint  Patient presents with  . Discharge Note    The patient is seen for a discharge visit.    HPI:  Pt is an 83 y.o. male seen today for discharge.  He is scheduled to discharge home on 08/22/17 with home health PT, OT, Social Work, and Nursing services.    He has been admitted to [facility] on 05/16/17 from [facility] admission dates 05/11/17 to 05/16/17 for TIA and left hip fracture S/P IM Nail/ORIF. He had acute transient aphasia and right-sided weakness worse than prior. EMS was activated and noted SBP in 70s. He was noted to have a large hematoma in the left hip area. He was found to have left subtrochanteric femur fracture for which he had IM nail/ORIF on 05/13/17. 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 He will need to follow-up with [facility]ist on 11/14/17 @ 8:45 AM. His hgb dropped from 12.5 to 9.3 and had transfusion of 2 units PRBC.  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    Patient was admitted to this facility for short-term rehabilitation after the patient's recent hospitalization.  Patient has completed SNF rehabilitation and therapy has cleared the patient for discharge.   Past Medical History:  Diagnosis Date  . Asthma   . Dementia   . Hypertension   . Seizures (HCC)   . Stroke [facility])    Past Surgical History:  Procedure Laterality Date  . BACK SURGERY    . FEMUR IM NAIL Left 05/13/2017   Procedure: INTRAMEDULLARY (IM) NAIL FEMORAL;  Surgeon: [name], MD;  Location: [facility] OR;  Service: Orthopedics;  Laterality: Left;  . KNEE SURGERY      Allergies  Allergen Reactions  . Penicillins Hives and Swelling    Patient couldn't answer any questions    Outpatient Encounter Medications as of 08/20/2017  Medication Sig  . acetaminophen (TYLENOL) 325 MG tablet Take 2 tablets (650 mg total) by mouth every 6 (six) hours as needed for mild pain or moderate pain (or temp > 37.5 C (99.5 F)).  Marland Kitchen aspirin 325 MG tablet Take 1 tablet (325 mg total) by mouth daily.  Marland Kitchen atorvastatin (LIPITOR) 80 MG tablet Take 1 tablet (80 mg total) by mouth daily at 6 PM.  . calcium citrate (CALCITRATE - DOSED IN MG ELEMENTAL CALCIUM) 950 MG tablet Take 1 tablet (200 mg of elemental calcium total) by mouth 2 (two) times daily.  . cholecalciferol 2000 units TABS Take 1 tablet (2,000 Units total) by mouth daily.  . divalproex (DEPAKOTE  ER) 500 MG 24 hr tablet Take 500 mg by mouth at bedtime.   . docusate sodium (COLACE) 100 MG capsule Take 1 capsule (100 mg total) by mouth 2 (two) times daily.  Marland Kitchen escitalopram (LEXAPRO) 10 MG tablet Take 10 mg by mouth daily.   . meloxicam (MOBIC) 7.5 MG tablet Take 7.5 mg by mouth every 12 (twelve) hours.   . NUTRITIONAL SUPPLEMENT LIQD Take 120 mLs by mouth daily. MedPass   . vitamin C (VITAMIN C) 500 MG tablet Take 1 tablet (500 mg total) by mouth daily.   No facility-administered encounter  medications on file as of 08/20/2017.     Review of Systems  GENERAL: No change in appetite, no fatigue, no weight changes, no fever, chills or weakness MOUTH and THROAT: Denies oral discomfort, gingival pain or bleeding RESPIRATORY: no cough, SOB, DOE, wheezing, hemoptysis CARDIAC: No chest pain, edema or palpitations GI: No abdominal pain, diarrhea, constipation, heart burn, nausea or vomiting PSYCHIATRIC: Denies feelings of depression or anxiety. No report of hallucinations, insomnia, paranoia, or agitation   Immunization History  Administered Date(s) Administered  . Tdap 08/22/2016   Pertinent  Health Maintenance Due  Topic Date Due  . PNA vac Low Risk Adult (1 of 2 - PCV13) 08/21/2018 (Originally 06/22/2001)  . INFLUENZA VACCINE  08/28/2017   Fall Risk  06/10/2017  Falls in the past year? Yes  Number falls in past yr: 1  Injury with Fall? Yes  Comment pt broke right femur last month  Risk Factor Category  High Fall Risk     Vitals:   08/20/17 0917  BP: 122/80  Pulse: 68  Resp: 18  Temp: (!) 97.2 F (36.2 C)  TempSrc: Oral  SpO2: 96%  Weight: 158 lb 9.6 oz (71.9 kg)  Height: 5\' 6"  (1.676 m)   Body mass index is 25.6 kg/m.  Physical Exam  GENERAL APPEARANCE: Well nourished. In no acute distress. Normal body habitus SKIN:  Skin is warm and dry.  MOUTH and THROAT: Lips are without lesions. Oral mucosa is moist and without lesions. Tongue is normal in shape, size, and color and without lesions RESPIRATORY: Breathing is even & unlabored, BS CTAB CARDIAC: RRR, no murmur,no extra heart sounds, no edema GI: Abdomen soft, normal BS, no masses, no tenderness EXTREMITIES:  Able to move X 4 extremities NEUROLOGICAL: There is no tremor. Speech is clear PSYCHIATRIC: Alert to self, disoriented to time and place. Affect and behavior are appropriate  Labs reviewed: Recent Labs    05/12/17 0429 05/13/17 0358 05/15/17 0421 05/16/17 0734 06/13/17  NA 140 139 136  --  136*   K 4.7 4.8 4.1  --  4.6  CL 105 107 101  --   --   CO2 24 24 28   --   --   GLUCOSE 108* 109* 98  --   --   BUN 21* 19 14  --  13  CREATININE 1.36* 0.91 0.83  --  0.8  CALCIUM 8.8* 8.5* 8.2*  8.2*  --   --   MG  --   --  1.9 1.9  --   PHOS  --   --  2.2* 2.9  --    Recent Labs    05/12/17 0429 05/15/17 0421 05/16/17 0734  AST 99* 64* 60*  ALT 32 30 32  ALKPHOS 48 37* 41  BILITOT 0.8 0.9 0.9  PROT 6.6 5.8* 5.5*  ALBUMIN 3.0* 2.5* 2.2*   Recent Labs    05/11/17  1135  05/14/17 0338 05/15/17 0421 05/16/17 0734 06/13/17  WBC 12.6*   < > 8.1 10.3 11.9* 8.1  NEUTROABS 10.9*  --  7.1  --   --  5  HGB 11.7*   < > 7.5* 9.3* 9.3* 10.6*  HCT 34.8*   < > 22.9* 28.0* 29.1* 33*  MCV 80.6   < > 83.6 82.8 84.1  --   PLT 203   < > 137* 118* 166 203   < > = values in this interval not displayed.   Lab Results  Component Value Date   TSH 1.845 05/15/2017   Lab Results  Component Value Date   HGBA1C 5.8 (H) 05/12/2017   Lab Results  Component Value Date   CHOL 177 05/12/2017   HDL 72 05/12/2017   LDLCALC 91 05/12/2017   TRIG 69 05/12/2017   CHOLHDL 2.5 05/12/2017     Assessment/Plan  1. Closed fracture of left femur, unspecified fracture morphology, unspecified portion of femur, sequela - S/P ORIF on 05/13/17, continue Meloxicam 7.5 mg Q 12 hours PRN and Acetaminophen 325 mg 2 tabs = 650 mg Q 6 hours PRN for pain, will have Home health PT and OT for therapeutic strengthening exercises, fall precautions   2. History of cerebral infarction -  Continue ASA 325 mg 1 tab daily   3. Seizures (HCC) - no recent seizures,  continue Depakote 500 mg Q HS   4. Benign essential HTN - well-controlled, not on any medications   5. Mood disorder (HCC) - mood is stable, continue Depakote 500 mg Q HS  6. Recurrent depression - continue Escitalopram 10 mg daily    I have filled out patient's discharge paperwork and written prescriptions.  Patient will receive home health PT, OT,  Nursing and Social Worker.  DME provided:  None  Total discharge time: Greater than 30 minutes Greater than 50% was spent in counseling and coordination of care.    Discharge time involved coordination of the discharge process with social worker, nursing staff and therapy department. Medical justification for home health services verified.   Kenard GowerMonina Medina-Vargas, NP [facility] [phone] (Monday-Friday 8:00 a.m. - 5:00 p.m.) [phone] (after hours)

## 2017-09-01 DIAGNOSIS — F329 Major depressive disorder, single episode, unspecified: Secondary | ICD-10-CM | POA: Diagnosis not present

## 2017-09-01 DIAGNOSIS — M6281 Muscle weakness (generalized): Secondary | ICD-10-CM | POA: Diagnosis not present

## 2017-09-01 DIAGNOSIS — F0391 Unspecified dementia with behavioral disturbance: Secondary | ICD-10-CM | POA: Diagnosis not present

## 2017-09-01 DIAGNOSIS — J449 Chronic obstructive pulmonary disease, unspecified: Secondary | ICD-10-CM | POA: Diagnosis not present

## 2017-09-01 DIAGNOSIS — I69398 Other sequelae of cerebral infarction: Secondary | ICD-10-CM | POA: Diagnosis not present

## 2017-09-01 DIAGNOSIS — R569 Unspecified convulsions: Secondary | ICD-10-CM | POA: Diagnosis not present

## 2017-09-01 DIAGNOSIS — Z7982 Long term (current) use of aspirin: Secondary | ICD-10-CM | POA: Diagnosis not present

## 2017-09-01 DIAGNOSIS — G459 Transient cerebral ischemic attack, unspecified: Secondary | ICD-10-CM | POA: Diagnosis not present

## 2017-09-01 DIAGNOSIS — M80052D Age-related osteoporosis with current pathological fracture, left femur, subsequent encounter for fracture with routine healing: Secondary | ICD-10-CM | POA: Diagnosis not present

## 2017-09-01 DIAGNOSIS — Z9181 History of falling: Secondary | ICD-10-CM | POA: Diagnosis not present

## 2017-09-03 DIAGNOSIS — I69398 Other sequelae of cerebral infarction: Secondary | ICD-10-CM | POA: Diagnosis not present

## 2017-09-03 DIAGNOSIS — M80052D Age-related osteoporosis with current pathological fracture, left femur, subsequent encounter for fracture with routine healing: Secondary | ICD-10-CM | POA: Diagnosis not present

## 2017-09-03 DIAGNOSIS — M6281 Muscle weakness (generalized): Secondary | ICD-10-CM | POA: Diagnosis not present

## 2017-09-03 DIAGNOSIS — R569 Unspecified convulsions: Secondary | ICD-10-CM | POA: Diagnosis not present

## 2017-09-03 DIAGNOSIS — F0391 Unspecified dementia with behavioral disturbance: Secondary | ICD-10-CM | POA: Diagnosis not present

## 2017-09-03 DIAGNOSIS — J449 Chronic obstructive pulmonary disease, unspecified: Secondary | ICD-10-CM | POA: Diagnosis not present

## 2017-09-05 DIAGNOSIS — M80052D Age-related osteoporosis with current pathological fracture, left femur, subsequent encounter for fracture with routine healing: Secondary | ICD-10-CM | POA: Diagnosis not present

## 2017-09-05 DIAGNOSIS — F0391 Unspecified dementia with behavioral disturbance: Secondary | ICD-10-CM | POA: Diagnosis not present

## 2017-09-05 DIAGNOSIS — R569 Unspecified convulsions: Secondary | ICD-10-CM | POA: Diagnosis not present

## 2017-09-05 DIAGNOSIS — I69398 Other sequelae of cerebral infarction: Secondary | ICD-10-CM | POA: Diagnosis not present

## 2017-09-05 DIAGNOSIS — J449 Chronic obstructive pulmonary disease, unspecified: Secondary | ICD-10-CM | POA: Diagnosis not present

## 2017-09-05 DIAGNOSIS — M6281 Muscle weakness (generalized): Secondary | ICD-10-CM | POA: Diagnosis not present

## 2017-09-08 DIAGNOSIS — J449 Chronic obstructive pulmonary disease, unspecified: Secondary | ICD-10-CM | POA: Diagnosis not present

## 2017-09-08 DIAGNOSIS — F0391 Unspecified dementia with behavioral disturbance: Secondary | ICD-10-CM | POA: Diagnosis not present

## 2017-09-08 DIAGNOSIS — I69398 Other sequelae of cerebral infarction: Secondary | ICD-10-CM | POA: Diagnosis not present

## 2017-09-08 DIAGNOSIS — M80052D Age-related osteoporosis with current pathological fracture, left femur, subsequent encounter for fracture with routine healing: Secondary | ICD-10-CM | POA: Diagnosis not present

## 2017-09-08 DIAGNOSIS — M6281 Muscle weakness (generalized): Secondary | ICD-10-CM | POA: Diagnosis not present

## 2017-09-08 DIAGNOSIS — R569 Unspecified convulsions: Secondary | ICD-10-CM | POA: Diagnosis not present

## 2017-09-11 DIAGNOSIS — M80052D Age-related osteoporosis with current pathological fracture, left femur, subsequent encounter for fracture with routine healing: Secondary | ICD-10-CM | POA: Diagnosis not present

## 2017-09-11 DIAGNOSIS — F0391 Unspecified dementia with behavioral disturbance: Secondary | ICD-10-CM | POA: Diagnosis not present

## 2017-09-11 DIAGNOSIS — M6281 Muscle weakness (generalized): Secondary | ICD-10-CM | POA: Diagnosis not present

## 2017-09-11 DIAGNOSIS — I69398 Other sequelae of cerebral infarction: Secondary | ICD-10-CM | POA: Diagnosis not present

## 2017-09-11 DIAGNOSIS — R569 Unspecified convulsions: Secondary | ICD-10-CM | POA: Diagnosis not present

## 2017-09-11 DIAGNOSIS — J449 Chronic obstructive pulmonary disease, unspecified: Secondary | ICD-10-CM | POA: Diagnosis not present

## 2017-09-12 DIAGNOSIS — J449 Chronic obstructive pulmonary disease, unspecified: Secondary | ICD-10-CM | POA: Diagnosis not present

## 2017-09-12 DIAGNOSIS — R569 Unspecified convulsions: Secondary | ICD-10-CM | POA: Diagnosis not present

## 2017-09-12 DIAGNOSIS — F0391 Unspecified dementia with behavioral disturbance: Secondary | ICD-10-CM | POA: Diagnosis not present

## 2017-09-12 DIAGNOSIS — M6281 Muscle weakness (generalized): Secondary | ICD-10-CM | POA: Diagnosis not present

## 2017-09-12 DIAGNOSIS — M80052D Age-related osteoporosis with current pathological fracture, left femur, subsequent encounter for fracture with routine healing: Secondary | ICD-10-CM | POA: Diagnosis not present

## 2017-09-12 DIAGNOSIS — I69398 Other sequelae of cerebral infarction: Secondary | ICD-10-CM | POA: Diagnosis not present

## 2017-09-15 DIAGNOSIS — I69398 Other sequelae of cerebral infarction: Secondary | ICD-10-CM | POA: Diagnosis not present

## 2017-09-15 DIAGNOSIS — J449 Chronic obstructive pulmonary disease, unspecified: Secondary | ICD-10-CM | POA: Diagnosis not present

## 2017-09-15 DIAGNOSIS — M6281 Muscle weakness (generalized): Secondary | ICD-10-CM | POA: Diagnosis not present

## 2017-09-15 DIAGNOSIS — F0391 Unspecified dementia with behavioral disturbance: Secondary | ICD-10-CM | POA: Diagnosis not present

## 2017-09-15 DIAGNOSIS — M80052D Age-related osteoporosis with current pathological fracture, left femur, subsequent encounter for fracture with routine healing: Secondary | ICD-10-CM | POA: Diagnosis not present

## 2017-09-15 DIAGNOSIS — R569 Unspecified convulsions: Secondary | ICD-10-CM | POA: Diagnosis not present

## 2017-09-16 DIAGNOSIS — F0391 Unspecified dementia with behavioral disturbance: Secondary | ICD-10-CM | POA: Diagnosis not present

## 2017-09-16 DIAGNOSIS — I69398 Other sequelae of cerebral infarction: Secondary | ICD-10-CM | POA: Diagnosis not present

## 2017-09-16 DIAGNOSIS — J449 Chronic obstructive pulmonary disease, unspecified: Secondary | ICD-10-CM | POA: Diagnosis not present

## 2017-09-16 DIAGNOSIS — M6281 Muscle weakness (generalized): Secondary | ICD-10-CM | POA: Diagnosis not present

## 2017-09-16 DIAGNOSIS — M80052D Age-related osteoporosis with current pathological fracture, left femur, subsequent encounter for fracture with routine healing: Secondary | ICD-10-CM | POA: Diagnosis not present

## 2017-09-16 DIAGNOSIS — R569 Unspecified convulsions: Secondary | ICD-10-CM | POA: Diagnosis not present

## 2017-09-18 DIAGNOSIS — M6281 Muscle weakness (generalized): Secondary | ICD-10-CM | POA: Diagnosis not present

## 2017-09-18 DIAGNOSIS — I69398 Other sequelae of cerebral infarction: Secondary | ICD-10-CM | POA: Diagnosis not present

## 2017-09-18 DIAGNOSIS — R569 Unspecified convulsions: Secondary | ICD-10-CM | POA: Diagnosis not present

## 2017-09-18 DIAGNOSIS — F0391 Unspecified dementia with behavioral disturbance: Secondary | ICD-10-CM | POA: Diagnosis not present

## 2017-09-18 DIAGNOSIS — M80052D Age-related osteoporosis with current pathological fracture, left femur, subsequent encounter for fracture with routine healing: Secondary | ICD-10-CM | POA: Diagnosis not present

## 2017-09-18 DIAGNOSIS — J449 Chronic obstructive pulmonary disease, unspecified: Secondary | ICD-10-CM | POA: Diagnosis not present

## 2017-09-19 DIAGNOSIS — R569 Unspecified convulsions: Secondary | ICD-10-CM | POA: Diagnosis not present

## 2017-09-19 DIAGNOSIS — J449 Chronic obstructive pulmonary disease, unspecified: Secondary | ICD-10-CM | POA: Diagnosis not present

## 2017-09-19 DIAGNOSIS — F0391 Unspecified dementia with behavioral disturbance: Secondary | ICD-10-CM | POA: Diagnosis not present

## 2017-09-19 DIAGNOSIS — M80052D Age-related osteoporosis with current pathological fracture, left femur, subsequent encounter for fracture with routine healing: Secondary | ICD-10-CM | POA: Diagnosis not present

## 2017-09-19 DIAGNOSIS — I69398 Other sequelae of cerebral infarction: Secondary | ICD-10-CM | POA: Diagnosis not present

## 2017-09-19 DIAGNOSIS — M6281 Muscle weakness (generalized): Secondary | ICD-10-CM | POA: Diagnosis not present

## 2017-09-22 DIAGNOSIS — M6281 Muscle weakness (generalized): Secondary | ICD-10-CM | POA: Diagnosis not present

## 2017-09-22 DIAGNOSIS — M80052D Age-related osteoporosis with current pathological fracture, left femur, subsequent encounter for fracture with routine healing: Secondary | ICD-10-CM | POA: Diagnosis not present

## 2017-09-22 DIAGNOSIS — I69398 Other sequelae of cerebral infarction: Secondary | ICD-10-CM | POA: Diagnosis not present

## 2017-09-22 DIAGNOSIS — R569 Unspecified convulsions: Secondary | ICD-10-CM | POA: Diagnosis not present

## 2017-09-22 DIAGNOSIS — F0391 Unspecified dementia with behavioral disturbance: Secondary | ICD-10-CM | POA: Diagnosis not present

## 2017-09-22 DIAGNOSIS — J449 Chronic obstructive pulmonary disease, unspecified: Secondary | ICD-10-CM | POA: Diagnosis not present

## 2017-09-23 DIAGNOSIS — F0391 Unspecified dementia with behavioral disturbance: Secondary | ICD-10-CM | POA: Diagnosis not present

## 2017-09-23 DIAGNOSIS — R569 Unspecified convulsions: Secondary | ICD-10-CM | POA: Diagnosis not present

## 2017-09-23 DIAGNOSIS — M80052D Age-related osteoporosis with current pathological fracture, left femur, subsequent encounter for fracture with routine healing: Secondary | ICD-10-CM | POA: Diagnosis not present

## 2017-09-23 DIAGNOSIS — I69398 Other sequelae of cerebral infarction: Secondary | ICD-10-CM | POA: Diagnosis not present

## 2017-09-23 DIAGNOSIS — M6281 Muscle weakness (generalized): Secondary | ICD-10-CM | POA: Diagnosis not present

## 2017-09-23 DIAGNOSIS — J449 Chronic obstructive pulmonary disease, unspecified: Secondary | ICD-10-CM | POA: Diagnosis not present

## 2017-09-25 DIAGNOSIS — J449 Chronic obstructive pulmonary disease, unspecified: Secondary | ICD-10-CM | POA: Diagnosis not present

## 2017-09-25 DIAGNOSIS — F0391 Unspecified dementia with behavioral disturbance: Secondary | ICD-10-CM | POA: Diagnosis not present

## 2017-09-25 DIAGNOSIS — I69398 Other sequelae of cerebral infarction: Secondary | ICD-10-CM | POA: Diagnosis not present

## 2017-09-25 DIAGNOSIS — M6281 Muscle weakness (generalized): Secondary | ICD-10-CM | POA: Diagnosis not present

## 2017-09-25 DIAGNOSIS — R569 Unspecified convulsions: Secondary | ICD-10-CM | POA: Diagnosis not present

## 2017-09-25 DIAGNOSIS — M80052D Age-related osteoporosis with current pathological fracture, left femur, subsequent encounter for fracture with routine healing: Secondary | ICD-10-CM | POA: Diagnosis not present

## 2017-09-30 DIAGNOSIS — M6281 Muscle weakness (generalized): Secondary | ICD-10-CM | POA: Diagnosis not present

## 2017-09-30 DIAGNOSIS — I69398 Other sequelae of cerebral infarction: Secondary | ICD-10-CM | POA: Diagnosis not present

## 2017-09-30 DIAGNOSIS — F0391 Unspecified dementia with behavioral disturbance: Secondary | ICD-10-CM | POA: Diagnosis not present

## 2017-09-30 DIAGNOSIS — M80052D Age-related osteoporosis with current pathological fracture, left femur, subsequent encounter for fracture with routine healing: Secondary | ICD-10-CM | POA: Diagnosis not present

## 2017-09-30 DIAGNOSIS — R569 Unspecified convulsions: Secondary | ICD-10-CM | POA: Diagnosis not present

## 2017-09-30 DIAGNOSIS — J449 Chronic obstructive pulmonary disease, unspecified: Secondary | ICD-10-CM | POA: Diagnosis not present

## 2017-10-02 DIAGNOSIS — F0391 Unspecified dementia with behavioral disturbance: Secondary | ICD-10-CM | POA: Diagnosis not present

## 2017-10-02 DIAGNOSIS — J449 Chronic obstructive pulmonary disease, unspecified: Secondary | ICD-10-CM | POA: Diagnosis not present

## 2017-10-02 DIAGNOSIS — I69398 Other sequelae of cerebral infarction: Secondary | ICD-10-CM | POA: Diagnosis not present

## 2017-10-02 DIAGNOSIS — M6281 Muscle weakness (generalized): Secondary | ICD-10-CM | POA: Diagnosis not present

## 2017-10-02 DIAGNOSIS — M80052D Age-related osteoporosis with current pathological fracture, left femur, subsequent encounter for fracture with routine healing: Secondary | ICD-10-CM | POA: Diagnosis not present

## 2017-10-02 DIAGNOSIS — R569 Unspecified convulsions: Secondary | ICD-10-CM | POA: Diagnosis not present

## 2017-10-06 DIAGNOSIS — M80052D Age-related osteoporosis with current pathological fracture, left femur, subsequent encounter for fracture with routine healing: Secondary | ICD-10-CM | POA: Diagnosis not present

## 2017-10-06 DIAGNOSIS — J449 Chronic obstructive pulmonary disease, unspecified: Secondary | ICD-10-CM | POA: Diagnosis not present

## 2017-10-06 DIAGNOSIS — M6281 Muscle weakness (generalized): Secondary | ICD-10-CM | POA: Diagnosis not present

## 2017-10-06 DIAGNOSIS — R569 Unspecified convulsions: Secondary | ICD-10-CM | POA: Diagnosis not present

## 2017-10-06 DIAGNOSIS — F0391 Unspecified dementia with behavioral disturbance: Secondary | ICD-10-CM | POA: Diagnosis not present

## 2017-10-06 DIAGNOSIS — I69398 Other sequelae of cerebral infarction: Secondary | ICD-10-CM | POA: Diagnosis not present

## 2017-10-07 DIAGNOSIS — J449 Chronic obstructive pulmonary disease, unspecified: Secondary | ICD-10-CM | POA: Diagnosis not present

## 2017-10-07 DIAGNOSIS — I69398 Other sequelae of cerebral infarction: Secondary | ICD-10-CM | POA: Diagnosis not present

## 2017-10-07 DIAGNOSIS — M6281 Muscle weakness (generalized): Secondary | ICD-10-CM | POA: Diagnosis not present

## 2017-10-07 DIAGNOSIS — M80052D Age-related osteoporosis with current pathological fracture, left femur, subsequent encounter for fracture with routine healing: Secondary | ICD-10-CM | POA: Diagnosis not present

## 2017-10-07 DIAGNOSIS — R569 Unspecified convulsions: Secondary | ICD-10-CM | POA: Diagnosis not present

## 2017-10-07 DIAGNOSIS — F0391 Unspecified dementia with behavioral disturbance: Secondary | ICD-10-CM | POA: Diagnosis not present

## 2017-10-09 DIAGNOSIS — M6281 Muscle weakness (generalized): Secondary | ICD-10-CM | POA: Diagnosis not present

## 2017-10-09 DIAGNOSIS — F0391 Unspecified dementia with behavioral disturbance: Secondary | ICD-10-CM | POA: Diagnosis not present

## 2017-10-09 DIAGNOSIS — J449 Chronic obstructive pulmonary disease, unspecified: Secondary | ICD-10-CM | POA: Diagnosis not present

## 2017-10-09 DIAGNOSIS — R569 Unspecified convulsions: Secondary | ICD-10-CM | POA: Diagnosis not present

## 2017-10-09 DIAGNOSIS — I69398 Other sequelae of cerebral infarction: Secondary | ICD-10-CM | POA: Diagnosis not present

## 2017-10-09 DIAGNOSIS — M80052D Age-related osteoporosis with current pathological fracture, left femur, subsequent encounter for fracture with routine healing: Secondary | ICD-10-CM | POA: Diagnosis not present

## 2017-10-11 DIAGNOSIS — R569 Unspecified convulsions: Secondary | ICD-10-CM | POA: Diagnosis not present

## 2017-10-11 DIAGNOSIS — M80052D Age-related osteoporosis with current pathological fracture, left femur, subsequent encounter for fracture with routine healing: Secondary | ICD-10-CM | POA: Diagnosis not present

## 2017-10-11 DIAGNOSIS — M6281 Muscle weakness (generalized): Secondary | ICD-10-CM | POA: Diagnosis not present

## 2017-10-11 DIAGNOSIS — F0391 Unspecified dementia with behavioral disturbance: Secondary | ICD-10-CM | POA: Diagnosis not present

## 2017-10-11 DIAGNOSIS — I69398 Other sequelae of cerebral infarction: Secondary | ICD-10-CM | POA: Diagnosis not present

## 2017-10-11 DIAGNOSIS — J449 Chronic obstructive pulmonary disease, unspecified: Secondary | ICD-10-CM | POA: Diagnosis not present

## 2017-10-13 DIAGNOSIS — M6281 Muscle weakness (generalized): Secondary | ICD-10-CM | POA: Diagnosis not present

## 2017-10-13 DIAGNOSIS — J449 Chronic obstructive pulmonary disease, unspecified: Secondary | ICD-10-CM | POA: Diagnosis not present

## 2017-10-13 DIAGNOSIS — I69398 Other sequelae of cerebral infarction: Secondary | ICD-10-CM | POA: Diagnosis not present

## 2017-10-13 DIAGNOSIS — F0391 Unspecified dementia with behavioral disturbance: Secondary | ICD-10-CM | POA: Diagnosis not present

## 2017-10-13 DIAGNOSIS — M80052D Age-related osteoporosis with current pathological fracture, left femur, subsequent encounter for fracture with routine healing: Secondary | ICD-10-CM | POA: Diagnosis not present

## 2017-10-13 DIAGNOSIS — R569 Unspecified convulsions: Secondary | ICD-10-CM | POA: Diagnosis not present

## 2017-10-15 DIAGNOSIS — F0391 Unspecified dementia with behavioral disturbance: Secondary | ICD-10-CM | POA: Diagnosis not present

## 2017-10-15 DIAGNOSIS — I69398 Other sequelae of cerebral infarction: Secondary | ICD-10-CM | POA: Diagnosis not present

## 2017-10-15 DIAGNOSIS — J449 Chronic obstructive pulmonary disease, unspecified: Secondary | ICD-10-CM | POA: Diagnosis not present

## 2017-10-15 DIAGNOSIS — M80052D Age-related osteoporosis with current pathological fracture, left femur, subsequent encounter for fracture with routine healing: Secondary | ICD-10-CM | POA: Diagnosis not present

## 2017-10-15 DIAGNOSIS — R569 Unspecified convulsions: Secondary | ICD-10-CM | POA: Diagnosis not present

## 2017-10-15 DIAGNOSIS — M6281 Muscle weakness (generalized): Secondary | ICD-10-CM | POA: Diagnosis not present

## 2017-10-16 DIAGNOSIS — R569 Unspecified convulsions: Secondary | ICD-10-CM | POA: Diagnosis not present

## 2017-10-16 DIAGNOSIS — F0391 Unspecified dementia with behavioral disturbance: Secondary | ICD-10-CM | POA: Diagnosis not present

## 2017-10-16 DIAGNOSIS — J449 Chronic obstructive pulmonary disease, unspecified: Secondary | ICD-10-CM | POA: Diagnosis not present

## 2017-10-16 DIAGNOSIS — M80052D Age-related osteoporosis with current pathological fracture, left femur, subsequent encounter for fracture with routine healing: Secondary | ICD-10-CM | POA: Diagnosis not present

## 2017-10-16 DIAGNOSIS — M6281 Muscle weakness (generalized): Secondary | ICD-10-CM | POA: Diagnosis not present

## 2017-10-16 DIAGNOSIS — I69398 Other sequelae of cerebral infarction: Secondary | ICD-10-CM | POA: Diagnosis not present

## 2017-10-17 DIAGNOSIS — M80052D Age-related osteoporosis with current pathological fracture, left femur, subsequent encounter for fracture with routine healing: Secondary | ICD-10-CM | POA: Diagnosis not present

## 2017-10-17 DIAGNOSIS — R569 Unspecified convulsions: Secondary | ICD-10-CM | POA: Diagnosis not present

## 2017-10-17 DIAGNOSIS — I69398 Other sequelae of cerebral infarction: Secondary | ICD-10-CM | POA: Diagnosis not present

## 2017-10-17 DIAGNOSIS — J449 Chronic obstructive pulmonary disease, unspecified: Secondary | ICD-10-CM | POA: Diagnosis not present

## 2017-10-17 DIAGNOSIS — M6281 Muscle weakness (generalized): Secondary | ICD-10-CM | POA: Diagnosis not present

## 2017-10-17 DIAGNOSIS — F0391 Unspecified dementia with behavioral disturbance: Secondary | ICD-10-CM | POA: Diagnosis not present

## 2017-10-22 DIAGNOSIS — M80052D Age-related osteoporosis with current pathological fracture, left femur, subsequent encounter for fracture with routine healing: Secondary | ICD-10-CM | POA: Diagnosis not present

## 2017-10-22 DIAGNOSIS — J449 Chronic obstructive pulmonary disease, unspecified: Secondary | ICD-10-CM | POA: Diagnosis not present

## 2017-10-22 DIAGNOSIS — F0391 Unspecified dementia with behavioral disturbance: Secondary | ICD-10-CM | POA: Diagnosis not present

## 2017-10-22 DIAGNOSIS — R569 Unspecified convulsions: Secondary | ICD-10-CM | POA: Diagnosis not present

## 2017-10-22 DIAGNOSIS — M6281 Muscle weakness (generalized): Secondary | ICD-10-CM | POA: Diagnosis not present

## 2017-10-22 DIAGNOSIS — I69398 Other sequelae of cerebral infarction: Secondary | ICD-10-CM | POA: Diagnosis not present

## 2017-10-23 DIAGNOSIS — F0391 Unspecified dementia with behavioral disturbance: Secondary | ICD-10-CM | POA: Diagnosis not present

## 2017-10-23 DIAGNOSIS — I69398 Other sequelae of cerebral infarction: Secondary | ICD-10-CM | POA: Diagnosis not present

## 2017-10-23 DIAGNOSIS — R569 Unspecified convulsions: Secondary | ICD-10-CM | POA: Diagnosis not present

## 2017-10-23 DIAGNOSIS — J449 Chronic obstructive pulmonary disease, unspecified: Secondary | ICD-10-CM | POA: Diagnosis not present

## 2017-10-23 DIAGNOSIS — M80052D Age-related osteoporosis with current pathological fracture, left femur, subsequent encounter for fracture with routine healing: Secondary | ICD-10-CM | POA: Diagnosis not present

## 2017-10-23 DIAGNOSIS — M6281 Muscle weakness (generalized): Secondary | ICD-10-CM | POA: Diagnosis not present

## 2017-10-27 DIAGNOSIS — M6281 Muscle weakness (generalized): Secondary | ICD-10-CM | POA: Diagnosis not present

## 2017-10-27 DIAGNOSIS — I69398 Other sequelae of cerebral infarction: Secondary | ICD-10-CM | POA: Diagnosis not present

## 2017-10-27 DIAGNOSIS — F0391 Unspecified dementia with behavioral disturbance: Secondary | ICD-10-CM | POA: Diagnosis not present

## 2017-10-27 DIAGNOSIS — J449 Chronic obstructive pulmonary disease, unspecified: Secondary | ICD-10-CM | POA: Diagnosis not present

## 2017-10-27 DIAGNOSIS — M80052D Age-related osteoporosis with current pathological fracture, left femur, subsequent encounter for fracture with routine healing: Secondary | ICD-10-CM | POA: Diagnosis not present

## 2017-10-27 DIAGNOSIS — R569 Unspecified convulsions: Secondary | ICD-10-CM | POA: Diagnosis not present

## 2017-10-28 DIAGNOSIS — F0391 Unspecified dementia with behavioral disturbance: Secondary | ICD-10-CM | POA: Diagnosis not present

## 2017-10-28 DIAGNOSIS — I69398 Other sequelae of cerebral infarction: Secondary | ICD-10-CM | POA: Diagnosis not present

## 2017-10-28 DIAGNOSIS — R569 Unspecified convulsions: Secondary | ICD-10-CM | POA: Diagnosis not present

## 2017-10-28 DIAGNOSIS — M6281 Muscle weakness (generalized): Secondary | ICD-10-CM | POA: Diagnosis not present

## 2017-10-28 DIAGNOSIS — M80052D Age-related osteoporosis with current pathological fracture, left femur, subsequent encounter for fracture with routine healing: Secondary | ICD-10-CM | POA: Diagnosis not present

## 2017-10-28 DIAGNOSIS — J449 Chronic obstructive pulmonary disease, unspecified: Secondary | ICD-10-CM | POA: Diagnosis not present

## 2017-10-30 DIAGNOSIS — J449 Chronic obstructive pulmonary disease, unspecified: Secondary | ICD-10-CM | POA: Diagnosis not present

## 2017-10-30 DIAGNOSIS — M6281 Muscle weakness (generalized): Secondary | ICD-10-CM | POA: Diagnosis not present

## 2017-10-30 DIAGNOSIS — F0391 Unspecified dementia with behavioral disturbance: Secondary | ICD-10-CM | POA: Diagnosis not present

## 2017-10-30 DIAGNOSIS — I69398 Other sequelae of cerebral infarction: Secondary | ICD-10-CM | POA: Diagnosis not present

## 2017-10-30 DIAGNOSIS — M80052D Age-related osteoporosis with current pathological fracture, left femur, subsequent encounter for fracture with routine healing: Secondary | ICD-10-CM | POA: Diagnosis not present

## 2017-10-30 DIAGNOSIS — R569 Unspecified convulsions: Secondary | ICD-10-CM | POA: Diagnosis not present

## 2017-10-31 DIAGNOSIS — Z9181 History of falling: Secondary | ICD-10-CM | POA: Diagnosis not present

## 2017-10-31 DIAGNOSIS — R569 Unspecified convulsions: Secondary | ICD-10-CM | POA: Diagnosis not present

## 2017-10-31 DIAGNOSIS — M80052D Age-related osteoporosis with current pathological fracture, left femur, subsequent encounter for fracture with routine healing: Secondary | ICD-10-CM | POA: Diagnosis not present

## 2017-10-31 DIAGNOSIS — I69398 Other sequelae of cerebral infarction: Secondary | ICD-10-CM | POA: Diagnosis not present

## 2017-10-31 DIAGNOSIS — F329 Major depressive disorder, single episode, unspecified: Secondary | ICD-10-CM | POA: Diagnosis not present

## 2017-10-31 DIAGNOSIS — Z7982 Long term (current) use of aspirin: Secondary | ICD-10-CM | POA: Diagnosis not present

## 2017-10-31 DIAGNOSIS — M6281 Muscle weakness (generalized): Secondary | ICD-10-CM | POA: Diagnosis not present

## 2017-10-31 DIAGNOSIS — F0391 Unspecified dementia with behavioral disturbance: Secondary | ICD-10-CM | POA: Diagnosis not present

## 2017-10-31 DIAGNOSIS — J449 Chronic obstructive pulmonary disease, unspecified: Secondary | ICD-10-CM | POA: Diagnosis not present

## 2017-11-03 DIAGNOSIS — R569 Unspecified convulsions: Secondary | ICD-10-CM | POA: Diagnosis not present

## 2017-11-03 DIAGNOSIS — M80052D Age-related osteoporosis with current pathological fracture, left femur, subsequent encounter for fracture with routine healing: Secondary | ICD-10-CM | POA: Diagnosis not present

## 2017-11-03 DIAGNOSIS — F0391 Unspecified dementia with behavioral disturbance: Secondary | ICD-10-CM | POA: Diagnosis not present

## 2017-11-03 DIAGNOSIS — I69398 Other sequelae of cerebral infarction: Secondary | ICD-10-CM | POA: Diagnosis not present

## 2017-11-03 DIAGNOSIS — M6281 Muscle weakness (generalized): Secondary | ICD-10-CM | POA: Diagnosis not present

## 2017-11-03 DIAGNOSIS — J449 Chronic obstructive pulmonary disease, unspecified: Secondary | ICD-10-CM | POA: Diagnosis not present

## 2017-11-05 DIAGNOSIS — F0391 Unspecified dementia with behavioral disturbance: Secondary | ICD-10-CM | POA: Diagnosis not present

## 2017-11-05 DIAGNOSIS — I69398 Other sequelae of cerebral infarction: Secondary | ICD-10-CM | POA: Diagnosis not present

## 2017-11-05 DIAGNOSIS — M80052D Age-related osteoporosis with current pathological fracture, left femur, subsequent encounter for fracture with routine healing: Secondary | ICD-10-CM | POA: Diagnosis not present

## 2017-11-05 DIAGNOSIS — M6281 Muscle weakness (generalized): Secondary | ICD-10-CM | POA: Diagnosis not present

## 2017-11-05 DIAGNOSIS — R569 Unspecified convulsions: Secondary | ICD-10-CM | POA: Diagnosis not present

## 2017-11-05 DIAGNOSIS — J449 Chronic obstructive pulmonary disease, unspecified: Secondary | ICD-10-CM | POA: Diagnosis not present

## 2017-11-06 DIAGNOSIS — M80052D Age-related osteoporosis with current pathological fracture, left femur, subsequent encounter for fracture with routine healing: Secondary | ICD-10-CM | POA: Diagnosis not present

## 2017-11-06 DIAGNOSIS — R569 Unspecified convulsions: Secondary | ICD-10-CM | POA: Diagnosis not present

## 2017-11-06 DIAGNOSIS — F0391 Unspecified dementia with behavioral disturbance: Secondary | ICD-10-CM | POA: Diagnosis not present

## 2017-11-06 DIAGNOSIS — M6281 Muscle weakness (generalized): Secondary | ICD-10-CM | POA: Diagnosis not present

## 2017-11-06 DIAGNOSIS — J449 Chronic obstructive pulmonary disease, unspecified: Secondary | ICD-10-CM | POA: Diagnosis not present

## 2017-11-06 DIAGNOSIS — I69398 Other sequelae of cerebral infarction: Secondary | ICD-10-CM | POA: Diagnosis not present

## 2017-11-07 DIAGNOSIS — R569 Unspecified convulsions: Secondary | ICD-10-CM | POA: Diagnosis not present

## 2017-11-07 DIAGNOSIS — M6281 Muscle weakness (generalized): Secondary | ICD-10-CM | POA: Diagnosis not present

## 2017-11-07 DIAGNOSIS — J449 Chronic obstructive pulmonary disease, unspecified: Secondary | ICD-10-CM | POA: Diagnosis not present

## 2017-11-07 DIAGNOSIS — M80052D Age-related osteoporosis with current pathological fracture, left femur, subsequent encounter for fracture with routine healing: Secondary | ICD-10-CM | POA: Diagnosis not present

## 2017-11-07 DIAGNOSIS — I69398 Other sequelae of cerebral infarction: Secondary | ICD-10-CM | POA: Diagnosis not present

## 2017-11-07 DIAGNOSIS — F0391 Unspecified dementia with behavioral disturbance: Secondary | ICD-10-CM | POA: Diagnosis not present

## 2017-11-10 DIAGNOSIS — F0391 Unspecified dementia with behavioral disturbance: Secondary | ICD-10-CM | POA: Diagnosis not present

## 2017-11-10 DIAGNOSIS — J449 Chronic obstructive pulmonary disease, unspecified: Secondary | ICD-10-CM | POA: Diagnosis not present

## 2017-11-10 DIAGNOSIS — R569 Unspecified convulsions: Secondary | ICD-10-CM | POA: Diagnosis not present

## 2017-11-10 DIAGNOSIS — M6281 Muscle weakness (generalized): Secondary | ICD-10-CM | POA: Diagnosis not present

## 2017-11-10 DIAGNOSIS — M80052D Age-related osteoporosis with current pathological fracture, left femur, subsequent encounter for fracture with routine healing: Secondary | ICD-10-CM | POA: Diagnosis not present

## 2017-11-10 DIAGNOSIS — I69398 Other sequelae of cerebral infarction: Secondary | ICD-10-CM | POA: Diagnosis not present

## 2017-11-11 DIAGNOSIS — J449 Chronic obstructive pulmonary disease, unspecified: Secondary | ICD-10-CM | POA: Diagnosis not present

## 2017-11-11 DIAGNOSIS — F0391 Unspecified dementia with behavioral disturbance: Secondary | ICD-10-CM | POA: Diagnosis not present

## 2017-11-11 DIAGNOSIS — M80052D Age-related osteoporosis with current pathological fracture, left femur, subsequent encounter for fracture with routine healing: Secondary | ICD-10-CM | POA: Diagnosis not present

## 2017-11-11 DIAGNOSIS — R569 Unspecified convulsions: Secondary | ICD-10-CM | POA: Diagnosis not present

## 2017-11-11 DIAGNOSIS — I69398 Other sequelae of cerebral infarction: Secondary | ICD-10-CM | POA: Diagnosis not present

## 2017-11-11 DIAGNOSIS — M6281 Muscle weakness (generalized): Secondary | ICD-10-CM | POA: Diagnosis not present

## 2017-11-12 DIAGNOSIS — J449 Chronic obstructive pulmonary disease, unspecified: Secondary | ICD-10-CM | POA: Diagnosis not present

## 2017-11-12 DIAGNOSIS — R569 Unspecified convulsions: Secondary | ICD-10-CM | POA: Diagnosis not present

## 2017-11-12 DIAGNOSIS — M6281 Muscle weakness (generalized): Secondary | ICD-10-CM | POA: Diagnosis not present

## 2017-11-12 DIAGNOSIS — M80052D Age-related osteoporosis with current pathological fracture, left femur, subsequent encounter for fracture with routine healing: Secondary | ICD-10-CM | POA: Diagnosis not present

## 2017-11-12 DIAGNOSIS — F0391 Unspecified dementia with behavioral disturbance: Secondary | ICD-10-CM | POA: Diagnosis not present

## 2017-11-12 DIAGNOSIS — I69398 Other sequelae of cerebral infarction: Secondary | ICD-10-CM | POA: Diagnosis not present

## 2017-11-14 ENCOUNTER — Encounter (HOSPITAL_COMMUNITY): Payer: Medicare Other

## 2017-11-14 ENCOUNTER — Ambulatory Visit: Payer: Medicare Other | Admitting: Family

## 2017-11-14 ENCOUNTER — Encounter: Payer: Self-pay | Admitting: Family

## 2017-11-14 DIAGNOSIS — I69398 Other sequelae of cerebral infarction: Secondary | ICD-10-CM | POA: Diagnosis not present

## 2017-11-14 DIAGNOSIS — F0391 Unspecified dementia with behavioral disturbance: Secondary | ICD-10-CM | POA: Diagnosis not present

## 2017-11-14 DIAGNOSIS — J449 Chronic obstructive pulmonary disease, unspecified: Secondary | ICD-10-CM | POA: Diagnosis not present

## 2017-11-14 DIAGNOSIS — R569 Unspecified convulsions: Secondary | ICD-10-CM | POA: Diagnosis not present

## 2017-11-14 DIAGNOSIS — M6281 Muscle weakness (generalized): Secondary | ICD-10-CM | POA: Diagnosis not present

## 2017-11-14 DIAGNOSIS — M80052D Age-related osteoporosis with current pathological fracture, left femur, subsequent encounter for fracture with routine healing: Secondary | ICD-10-CM | POA: Diagnosis not present

## 2017-11-17 DIAGNOSIS — F0391 Unspecified dementia with behavioral disturbance: Secondary | ICD-10-CM | POA: Diagnosis not present

## 2017-11-17 DIAGNOSIS — I69398 Other sequelae of cerebral infarction: Secondary | ICD-10-CM | POA: Diagnosis not present

## 2017-11-17 DIAGNOSIS — M80052D Age-related osteoporosis with current pathological fracture, left femur, subsequent encounter for fracture with routine healing: Secondary | ICD-10-CM | POA: Diagnosis not present

## 2017-11-17 DIAGNOSIS — R569 Unspecified convulsions: Secondary | ICD-10-CM | POA: Diagnosis not present

## 2017-11-17 DIAGNOSIS — J449 Chronic obstructive pulmonary disease, unspecified: Secondary | ICD-10-CM | POA: Diagnosis not present

## 2017-11-17 DIAGNOSIS — M6281 Muscle weakness (generalized): Secondary | ICD-10-CM | POA: Diagnosis not present

## 2017-11-18 DIAGNOSIS — F0391 Unspecified dementia with behavioral disturbance: Secondary | ICD-10-CM | POA: Diagnosis not present

## 2017-11-18 DIAGNOSIS — R569 Unspecified convulsions: Secondary | ICD-10-CM | POA: Diagnosis not present

## 2017-11-18 DIAGNOSIS — J449 Chronic obstructive pulmonary disease, unspecified: Secondary | ICD-10-CM | POA: Diagnosis not present

## 2017-11-18 DIAGNOSIS — M6281 Muscle weakness (generalized): Secondary | ICD-10-CM | POA: Diagnosis not present

## 2017-11-18 DIAGNOSIS — M80052D Age-related osteoporosis with current pathological fracture, left femur, subsequent encounter for fracture with routine healing: Secondary | ICD-10-CM | POA: Diagnosis not present

## 2017-11-18 DIAGNOSIS — I69398 Other sequelae of cerebral infarction: Secondary | ICD-10-CM | POA: Diagnosis not present

## 2017-11-20 DIAGNOSIS — J449 Chronic obstructive pulmonary disease, unspecified: Secondary | ICD-10-CM | POA: Diagnosis not present

## 2017-11-20 DIAGNOSIS — R569 Unspecified convulsions: Secondary | ICD-10-CM | POA: Diagnosis not present

## 2017-11-20 DIAGNOSIS — I69398 Other sequelae of cerebral infarction: Secondary | ICD-10-CM | POA: Diagnosis not present

## 2017-11-20 DIAGNOSIS — F0391 Unspecified dementia with behavioral disturbance: Secondary | ICD-10-CM | POA: Diagnosis not present

## 2017-11-20 DIAGNOSIS — M6281 Muscle weakness (generalized): Secondary | ICD-10-CM | POA: Diagnosis not present

## 2017-11-20 DIAGNOSIS — M80052D Age-related osteoporosis with current pathological fracture, left femur, subsequent encounter for fracture with routine healing: Secondary | ICD-10-CM | POA: Diagnosis not present

## 2017-11-21 DIAGNOSIS — I69398 Other sequelae of cerebral infarction: Secondary | ICD-10-CM | POA: Diagnosis not present

## 2017-11-21 DIAGNOSIS — R569 Unspecified convulsions: Secondary | ICD-10-CM | POA: Diagnosis not present

## 2017-11-21 DIAGNOSIS — M6281 Muscle weakness (generalized): Secondary | ICD-10-CM | POA: Diagnosis not present

## 2017-11-21 DIAGNOSIS — F0391 Unspecified dementia with behavioral disturbance: Secondary | ICD-10-CM | POA: Diagnosis not present

## 2017-11-21 DIAGNOSIS — J449 Chronic obstructive pulmonary disease, unspecified: Secondary | ICD-10-CM | POA: Diagnosis not present

## 2017-11-21 DIAGNOSIS — M80052D Age-related osteoporosis with current pathological fracture, left femur, subsequent encounter for fracture with routine healing: Secondary | ICD-10-CM | POA: Diagnosis not present

## 2018-11-11 ENCOUNTER — Emergency Department (HOSPITAL_COMMUNITY)
Admission: EM | Admit: 2018-11-11 | Discharge: 2018-11-11 | Disposition: A | Discharge status: Home / Self Care | Payer: Medicare Other | Attending: Emergency Medicine | Admitting: Emergency Medicine

## 2018-11-11 ENCOUNTER — Encounter (HOSPITAL_COMMUNITY): Payer: Self-pay

## 2018-11-11 ENCOUNTER — Other Ambulatory Visit: Payer: Self-pay

## 2018-11-11 DIAGNOSIS — Z87891 Personal history of nicotine dependence: Secondary | ICD-10-CM | POA: Diagnosis not present

## 2018-11-11 DIAGNOSIS — G40909 Epilepsy, unspecified, not intractable, without status epilepticus: Secondary | ICD-10-CM | POA: Diagnosis not present

## 2018-11-11 DIAGNOSIS — Z79899 Other long term (current) drug therapy: Secondary | ICD-10-CM | POA: Insufficient documentation

## 2018-11-11 DIAGNOSIS — Z880 Allergy status to penicillin: Secondary | ICD-10-CM | POA: Insufficient documentation

## 2018-11-11 DIAGNOSIS — I10 Essential (primary) hypertension: Secondary | ICD-10-CM | POA: Insufficient documentation

## 2018-11-11 DIAGNOSIS — F0390 Unspecified dementia without behavioral disturbance: Secondary | ICD-10-CM | POA: Diagnosis not present

## 2018-11-11 DIAGNOSIS — R197 Diarrhea, unspecified: Secondary | ICD-10-CM | POA: Diagnosis not present

## 2018-11-11 DIAGNOSIS — F0391 Unspecified dementia with behavioral disturbance: Secondary | ICD-10-CM | POA: Insufficient documentation

## 2018-11-11 DIAGNOSIS — J45909 Unspecified asthma, uncomplicated: Secondary | ICD-10-CM | POA: Insufficient documentation

## 2018-11-11 DIAGNOSIS — Z8673 Personal history of transient ischemic attack (TIA), and cerebral infarction without residual deficits: Secondary | ICD-10-CM | POA: Diagnosis not present

## 2018-11-11 DIAGNOSIS — Z7982 Long term (current) use of aspirin: Secondary | ICD-10-CM | POA: Diagnosis not present

## 2018-11-11 LAB — COMPREHENSIVE METABOLIC PANEL
ALT: 20 U/L (ref 0–44)
AST: 21 U/L (ref 15–41)
Albumin: 3.5 g/dL (ref 3.5–5.0)
Alkaline Phosphatase: 66 U/L (ref 38–126)
Anion gap: 10 (ref 5–15)
BUN: 13 mg/dL (ref 8–23)
CO2: 29 mmol/L (ref 22–32)
Calcium: 9.5 mg/dL (ref 8.9–10.3)
Chloride: 102 mmol/L (ref 98–111)
Creatinine, Ser: 0.96 mg/dL (ref 0.61–1.24)
GFR calc Af Amer: >60 mL/min (ref >60)
GFR calc non Af Amer: >60 mL/min (ref >60)
Glucose, Bld: 92 mg/dL (ref 70–99)
Potassium: 4.2 mmol/L (ref 3.5–5.1)
Sodium: 141 mmol/L (ref 135–145)
Total Bilirubin: 0.5 mg/dL (ref 0.3–1.2)
Total Protein: 7.9 g/dL (ref 6.5–8.1)

## 2018-11-11 LAB — CBC WITH DIFFERENTIAL/PLATELET
Abs Immature Granulocytes: 0.02 10*3/uL (ref 0.00–0.07)
Basophils Absolute: 0 10*3/uL (ref 0.0–0.1)
Basophils Relative: 1 %
Eosinophils Absolute: 0.2 10*3/uL (ref 0.0–0.5)
Eosinophils Relative: 3 %
HCT: 42.9 % (ref 39.0–52.0)
Hemoglobin: 13.3 g/dL (ref 13.0–17.0)
Immature Granulocytes: 0 %
Lymphocytes Relative: 22 %
Lymphs Abs: 1.3 10*3/uL (ref 0.7–4.0)
MCH: 26.1 pg (ref 26.0–34.0)
MCHC: 31 g/dL (ref 30.0–36.0)
MCV: 84.1 fL (ref 80.0–100.0)
Monocytes Absolute: 0.7 10*3/uL (ref 0.1–1.0)
Monocytes Relative: 11 %
Neutro Abs: 3.9 10*3/uL (ref 1.7–7.7)
Neutrophils Relative %: 63 %
Platelets: 219 10*3/uL (ref 150–400)
RBC: 5.1 MIL/uL (ref 4.22–5.81)
RDW: 16.1 % — ABNORMAL HIGH (ref 11.5–15.5)
WBC: 6.2 10*3/uL (ref 4.0–10.5)
nRBC: 0 % (ref 0.0–0.2)

## 2018-11-11 LAB — VALPROIC ACID LEVEL: Valproic Acid Lvl: 19 ug/mL — ABNORMAL LOW (ref 50.0–100.0)

## 2018-11-11 MED ORDER — SODIUM CHLORIDE 0.9 % IV BOLUS
1000.0000 mL | Freq: Once | INTRAVENOUS | Status: AC
  Administered 2018-11-11: 1000 mL via INTRAVENOUS

## 2018-11-11 NOTE — ED Provider Notes (Signed)
[facility] DEPT Provider Note   CSN: [id number] Arrival date & time: 11/11/18  1728    LEVEL 5 CAVEAT - DEMENTIA   History   Chief Complaint Chief Complaint  Patient presents with  . Diarrhea    HPI Johnathan Mitchell is a 84 y.o. male.     HPI  84 year old male presents via EMS for diarrhea.  History is mostly taken from the daughter, [name].  The patient has been having diarrhea and his Depakote was stopped several days ago.  It seemed like the diarrhea got better.  Restarted it 3 days ago and then the diarrhea has recurred.  Has had numerous loose bowel movements but no reported blood.  No fevers or abdominal pain.  No vomiting. Has a history of dementia but no new confusion.  Past Medical History:  Diagnosis Date  . Asthma   . Dementia ([place])   . Hypertension   . Seizures ([place])   . Stroke [facility])     Patient Active Problem List   Diagnosis Date Noted  . Protein-calorie malnutrition ([place]) 05/20/2017  . TIA (transient ischemic attack) 05/20/2017  . 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 Seizures ([place]) 05/19/2017  . AKI (acute kidney injury) ([place]) 05/12/2017  . Stroke ([place]) 05/11/2017  . Femur fracture ([place]) 05/11/2017  . Chest pain 02/09/2016  . HTN (hypertension) 02/09/2016  . Dementia with behavioral disturbance ([place]) 02/09/2016  . Chest pain at rest 02/09/2016  . Cerebral infarction ([place]) 02/01/2014  . Benign essential HTN 02/01/2014    Past Surgical History:  Procedure Laterality Date  . BACK SURGERY    . FEMUR IM NAIL Left 05/13/2017   Procedure: INTRAMEDULLARY (IM) NAIL FEMORAL;  Surgeon: Altamese Cazadero, MD;  Location: [facility];  Service: Orthopedics;  Laterality: Left;  . KNEE SURGERY          Home Medications    Prior to Admission medications   Medication Sig Start Date End Date Taking? Authorizing Provider  atorvastatin (LIPITOR) 80 MG tablet Take 1 tablet (80 mg total) by mouth daily at 6 PM. 08/20/17  Yes  Medina-Vargas, Monina C, NP  divalproex (DEPAKOTE ER) 500 MG 24 hr tablet Take 1 tablet (500 mg total) by mouth at bedtime. 08/20/17  Yes Medina-Vargas, Monina C, NP  acetaminophen (TYLENOL) 325 MG tablet Take 2 tablets (650 mg total) by mouth every 6 (six) hours as needed for mild pain or moderate pain (or temp > 37.5 C (99.5 F)). Patient not taking: Reported on 11/11/2018 05/16/17   Ainsley Spinner, PA-C  aspirin 325 MG tablet Take 1 tablet (325 mg total) by mouth daily. Patient not taking: Reported on 11/11/2018 05/17/17   Modena Jansky, MD  calcium citrate (CALCITRATE - DOSED IN MG ELEMENTAL CALCIUM) 950 MG tablet Take 1 tablet (200 mg of elemental calcium total) by mouth 2 (two) times daily. Patient not taking: Reported on 11/11/2018 08/20/17   Medina-Vargas, Jaymes Graff C, NP  Cholecalciferol 2000 units TABS Take 1 tablet (2,000 Units total) by mouth daily. Patient not taking: Reported on 11/11/2018 08/20/17   Medina-Vargas, Monina C, NP  docusate sodium (COLACE) 100 MG capsule Take 1 capsule (100 mg total) by mouth 2 (two) times daily. Patient not taking: Reported on 11/11/2018 05/16/17   Modena Jansky, MD  escitalopram (LEXAPRO) 10 MG tablet Take 1 tablet (10 mg total) by mouth daily. Patient not taking: Reported on 11/11/2018 08/20/17   Medina-Vargas, Monina C, NP  meloxicam (MOBIC) 7.5 MG tablet  Take 1 tablet (7.5 mg total) by mouth every 12 (twelve) hours. Patient not taking: Reported on 11/11/2018 08/20/17   Medina-Vargas, Monina C, NP  vitamin C (VITAMIN C) 500 MG tablet Take 1 tablet (500 mg total) by mouth daily. Patient not taking: Reported on 11/11/2018 05/16/17   Montez MoritaPaul, Keith, PA-C    Family History Family History  Problem Relation Age of Onset  . Hyperlipidemia Mother   . Hypertension Mother     Social History Social History   Tobacco Use  . Smoking status: Former Games developermoker  . Smokeless tobacco: Never Used  Substance Use Topics  . Alcohol use: No  . Drug use: No      Allergies   Penicillins   Review of Systems Review of Systems  Unable to perform ROS: Dementia     Physical Exam Updated Vital Signs BP (!) 124/94   Pulse 74   Temp 97.7 F (36.5 C) (Oral)   Resp 16   SpO2 99%   Physical Exam Vitals signs and nursing note reviewed.  Constitutional:      Appearance: He is well-developed.  HENT:     Head: Normocephalic and atraumatic.     Right Ear: External ear normal.     Left Ear: External ear normal.     Nose: Nose normal.  Eyes:     General:        Right eye: No discharge.        Left eye: No discharge.  Neck:     Musculoskeletal: Neck supple.  Cardiovascular:     Rate and Rhythm: Normal rate and regular rhythm.     Heart sounds: Normal heart sounds.  Pulmonary:     Effort: Pulmonary effort is normal.     Breath sounds: Normal breath sounds.  Abdominal:     General: There is no distension.     Palpations: Abdomen is soft.     Tenderness: There is no abdominal tenderness.  Skin:    General: Skin is warm and dry.  Neurological:     Mental Status: He is alert.  Psychiatric:        Mood and Affect: Mood is not anxious.      ED Treatments / Results  Labs (all labs ordered are listed, but only abnormal results are displayed) Labs Reviewed  CBC WITH DIFFERENTIAL/PLATELET - Abnormal; Notable for the following components:      Result Value   RDW 16.1 (*)    All other components within normal limits  VALPROIC ACID LEVEL - Abnormal; Notable for the following components:   Valproic Acid Lvl 19 (*)    All other components within normal limits  COMPREHENSIVE METABOLIC PANEL    EKG None  Radiology No results found.  Procedures Procedures (including critical care time)  Medications Ordered in ED Medications  sodium chloride 0.9 % bolus 1,000 mL (1,000 mLs Intravenous New Bag/Given 11/11/18 1828)     Initial Impression / Assessment and Plan / ED Course  I have reviewed the triage vital signs and the nursing  notes.  Pertinent labs & imaging results that were available during my care of the patient were reviewed by me and considered in my medical decision making (see chart for details).        Patient's exam and vital signs are benign including no abdominal tenderness.  His lab work is reassuring.  His valproate level is low, which makes sense given that the daughter just opted and started restarting it.  Otherwise I  do not think acute imaging is needed and I think he is stable for discharge home.  Discussed the results with his daughter and she will call his PCP for possible medication adjustment.  Final Clinical Impressions(s) / ED Diagnoses   Final diagnoses:  Diarrhea, unspecified type    ED Discharge Orders    None       Pricilla Loveless, MD 11/11/18 [phone]

## 2018-11-11 NOTE — ED Triage Notes (Signed)
EMS reports from home, c/o diarrhea x 3 days. Hx of seizures takes Depakote, daughter states every time she gives him medicine he begins to experience diarrhea. Daughter states she has kept him well hydrated. Daughter is POA and would like call from Phys. [name] [phone]  BP 120/93 HR 93 RR 18 Sp02 96 RA CBG 146 Temp. 98.4

## 2019-07-11 ENCOUNTER — Emergency Department (HOSPITAL_COMMUNITY): Payer: Medicare Other

## 2019-07-11 ENCOUNTER — Encounter (HOSPITAL_COMMUNITY): Payer: Self-pay

## 2019-07-11 ENCOUNTER — Emergency Department (HOSPITAL_COMMUNITY)
Admission: EM | Admit: 2019-07-11 | Discharge: 2019-07-11 | Disposition: A | Discharge status: Home / Self Care | Payer: Medicare Other | Attending: Emergency Medicine | Admitting: Emergency Medicine

## 2019-07-11 DIAGNOSIS — Z87891 Personal history of nicotine dependence: Secondary | ICD-10-CM | POA: Insufficient documentation

## 2019-07-11 DIAGNOSIS — Z880 Allergy status to penicillin: Secondary | ICD-10-CM | POA: Insufficient documentation

## 2019-07-11 DIAGNOSIS — Z7982 Long term (current) use of aspirin: Secondary | ICD-10-CM | POA: Insufficient documentation

## 2019-07-11 DIAGNOSIS — Z8249 Family history of ischemic heart disease and other diseases of the circulatory system: Secondary | ICD-10-CM | POA: Insufficient documentation

## 2019-07-11 DIAGNOSIS — Z79899 Other long term (current) drug therapy: Secondary | ICD-10-CM | POA: Diagnosis not present

## 2019-07-11 DIAGNOSIS — F919 Conduct disorder, unspecified: Secondary | ICD-10-CM | POA: Diagnosis not present

## 2019-07-11 DIAGNOSIS — Z8673 Personal history of transient ischemic attack (TIA), and cerebral infarction without residual deficits: Secondary | ICD-10-CM | POA: Diagnosis not present

## 2019-07-11 DIAGNOSIS — J45909 Unspecified asthma, uncomplicated: Secondary | ICD-10-CM | POA: Diagnosis not present

## 2019-07-11 DIAGNOSIS — R4182 Altered mental status, unspecified: Secondary | ICD-10-CM | POA: Insufficient documentation

## 2019-07-11 DIAGNOSIS — I10 Essential (primary) hypertension: Secondary | ICD-10-CM | POA: Diagnosis not present

## 2019-07-11 DIAGNOSIS — F0390 Unspecified dementia without behavioral disturbance: Secondary | ICD-10-CM | POA: Insufficient documentation

## 2019-07-11 DIAGNOSIS — R569 Unspecified convulsions: Secondary | ICD-10-CM | POA: Diagnosis not present

## 2019-07-11 DIAGNOSIS — R4689 Other symptoms and signs involving appearance and behavior: Secondary | ICD-10-CM

## 2019-07-11 DIAGNOSIS — G40909 Epilepsy, unspecified, not intractable, without status epilepticus: Secondary | ICD-10-CM | POA: Diagnosis not present

## 2019-07-11 LAB — COMPREHENSIVE METABOLIC PANEL
ALT: 17 U/L (ref 0–44)
AST: 20 U/L (ref 15–41)
Albumin: 3.7 g/dL (ref 3.5–5.0)
Alkaline Phosphatase: 71 U/L (ref 38–126)
Anion gap: 13 (ref 5–15)
BUN: 17 mg/dL (ref 8–23)
CO2: 25 mmol/L (ref 22–32)
Calcium: 9.2 mg/dL (ref 8.9–10.3)
Chloride: 102 mmol/L (ref 98–111)
Creatinine, Ser: 0.86 mg/dL (ref 0.61–1.24)
GFR calc Af Amer: >60 mL/min (ref >60)
GFR calc non Af Amer: >60 mL/min (ref >60)
Glucose, Bld: 94 mg/dL (ref 70–99)
Potassium: 4.4 mmol/L (ref 3.5–5.1)
Sodium: 140 mmol/L (ref 135–145)
Total Bilirubin: 0.7 mg/dL (ref 0.3–1.2)
Total Protein: 8.4 g/dL — ABNORMAL HIGH (ref 6.5–8.1)

## 2019-07-11 LAB — CBC WITH DIFFERENTIAL/PLATELET
Abs Immature Granulocytes: 0.02 10*3/uL (ref 0.00–0.07)
Basophils Absolute: 0 10*3/uL (ref 0.0–0.1)
Basophils Relative: 1 %
Eosinophils Absolute: 0.2 10*3/uL (ref 0.0–0.5)
Eosinophils Relative: 2 %
HCT: 43.3 % (ref 39.0–52.0)
Hemoglobin: 13.4 g/dL (ref 13.0–17.0)
Immature Granulocytes: 0 %
Lymphocytes Relative: 25 %
Lymphs Abs: 1.9 10*3/uL (ref 0.7–4.0)
MCH: 26.6 pg (ref 26.0–34.0)
MCHC: 30.9 g/dL (ref 30.0–36.0)
MCV: 85.9 fL (ref 80.0–100.0)
Monocytes Absolute: 0.7 10*3/uL (ref 0.1–1.0)
Monocytes Relative: 10 %
Neutro Abs: 4.7 10*3/uL (ref 1.7–7.7)
Neutrophils Relative %: 62 %
Platelets: 226 10*3/uL (ref 150–400)
RBC: 5.04 MIL/uL (ref 4.22–5.81)
RDW: 15.7 % — ABNORMAL HIGH (ref 11.5–15.5)
WBC: 7.6 10*3/uL (ref 4.0–10.5)
nRBC: 0 % (ref 0.0–0.2)

## 2019-07-11 LAB — URINALYSIS, ROUTINE W REFLEX MICROSCOPIC
Bacteria, UA: NONE SEEN
Bilirubin Urine: NEGATIVE
Glucose, UA: NEGATIVE mg/dL
Ketones, ur: NEGATIVE mg/dL
Leukocytes,Ua: NEGATIVE
Nitrite: NEGATIVE
Protein, ur: NEGATIVE mg/dL
Specific Gravity, Urine: 1.023 (ref 1.005–1.030)
pH: 5 (ref 5.0–8.0)

## 2019-07-11 LAB — BRAIN NATRIURETIC PEPTIDE: B Natriuretic Peptide: 28.6 pg/mL (ref 0.0–100.0)

## 2019-07-11 MED ORDER — VIMPAT 100 MG PO TABS: 100.0000 mg | ORAL_TABLET | Freq: Two times a day (BID) | ORAL | 2 refills | Status: DC

## 2019-07-11 MED ORDER — LACOSAMIDE 50 MG PO TABS
100.0000 mg | ORAL_TABLET | Freq: Two times a day (BID) | ORAL | Status: DC
  Administered 2019-07-11: 100 mg via ORAL
  Filled 2019-07-11: qty 2

## 2019-07-11 NOTE — ED Notes (Signed)
Patient transported to CT 

## 2019-07-11 NOTE — Discharge Instructions (Signed)
Start taking lacosamide twice daily for seizure activity.  [facility] for [facility] of symptoms.  Return to the emergency department if any concerning signs or symptoms develop such as fevers, loss of consciousness, vomiting, weakness to 1 side of the body, unresponsiveness

## 2019-07-11 NOTE — ED Provider Notes (Signed)
[facility]-EMERGENCY DEPT Provider Note   CSN: [id number] Arrival date & time: 07/11/19  1831     History Chief Complaint  Patient presents with  . Weakness   Level 5 caveat due to altered mental status  Johnathan Mitchell is a 85 y.o. male with history of asthma, dementia, hypertension, seizures, CVA, iliac artery dissection presents brought in by EMS for evaluation of progressively worsening altered mental status.  History is limited due to patient's dementia.  Triage note mentions that the patient "has been more confused than usual".  Patient on my assessment is resting comfortably in bed, alert and oriented to person only.  He knows that he lives with his daughter.  He knows that he was in the [employer].  When asked any questions regarding symptoms he has difficulty answering questions outside of simple yes or no questions.  He denies headaches, fevers, nausea, vomiting, chest pain, shortness of breath, abdominal pain, numbness, weakness, or vision changes.  He does feel that he is having difficulty speaking and forming words.  He cannot tell me how long this has been present.  When I lifted the patient's sheet to assess him he had urinated on his clothing.  I spoke with the patient's daughter who is now at the bedside.  She states that today when she went to check on the patient he was staring off into space, drooling, unresponsive.  She states that he then became moderately responsive but drowsy and confused.  She states that he was repetitively drinking from his cup.  He then began to talk to her but seemed to not make any sense.  She states that he has had several episodes like this over the last few months.  She notes that he has a [place] of [place] [facility] [place] [facility] [place] [facility] as it was thought to be causing behavioral disturbances and aggression.  She denies fever, vomiting.  She states that at this time he appears to be back to baseline.  The  history is provided by the patient and medical records. The history is limited by the condition of the patient.       Past Medical History:  Diagnosis Date  . Asthma   . Dementia ([facility])   . Hypertension   . Seizures ([facility])   . Stroke [facility](HCC)     Patient Active Problem List   Diagnosis Date Noted  . Protein-calorie malnutrition ([facility]) 05/20/2017  . TIA (transient ischemic attack) 05/20/2017  . 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 Seizures ([facility]) 05/19/2017  . AKI (acute kidney injury) ([facility]) 05/12/2017  . Stroke ([facility]) 05/11/2017  . Femur fracture ([facility]) 05/11/2017  . Chest pain 02/09/2016  . HTN (hypertension) 02/09/2016  . Dementia with behavioral disturbance ([facility]) 02/09/2016  . Chest pain at rest 02/09/2016  . Cerebral infarction ([facility]) 02/01/2014  . Benign essential HTN 02/01/2014    Past Surgical History:  Procedure Laterality Date  . BACK SURGERY    . FEMUR IM NAIL Left 05/13/2017   Procedure: INTRAMEDULLARY (IM) NAIL FEMORAL;  Surgeon: Myrene GalasHandy, Michael, MD;  Location: [facility] OR;  Service: Orthopedics;  Laterality: Left;  . KNEE SURGERY         Family History  Problem Relation Age of Onset  . Hyperlipidemia Mother   . Hypertension Mother     Social History   Tobacco Use  . Smoking status: Former Games developermoker  . Smokeless tobacco: Never Used  Substance Use Topics  .  Alcohol use: No  . Drug use: No    Home Medications Prior to Admission medications   Medication Sig Start Date End Date Taking? Authorizing Provider  acetaminophen (TYLENOL) 325 MG tablet Take 2 tablets (650 mg total) by mouth every 6 (six) hours as needed for mild pain or moderate pain (or temp > 37.5 C (99.5 F)). 05/16/17  Yes [name], PA-C  aspirin 325 MG tablet Take 1 tablet (325 mg total) by mouth daily. 05/17/17  Yes Hongalgi, Maximino Greenland, MD  atorvastatin (LIPITOR) 80 MG tablet Take 1 tablet (80 mg total) by mouth daily at 6 PM. Patient not taking: Reported on 07/11/2019 08/20/17   Medina-Vargas,  Monina C, NP  calcium citrate (CALCITRATE - DOSED IN MG ELEMENTAL CALCIUM) 950 MG tablet Take 1 tablet (200 mg of elemental calcium total) by mouth 2 (two) times daily. Patient not taking: Reported on 11/11/2018 08/20/17   Medina-Vargas, Avanell Shackleton C, NP  Cholecalciferol 2000 units TABS Take 1 tablet (2,000 Units total) by mouth daily. Patient not taking: Reported on 11/11/2018 08/20/17   Medina-Vargas, Monina C, NP  divalproex (DEPAKOTE ER) 500 MG 24 hr tablet Take 1 tablet (500 mg total) by mouth at bedtime. Patient not taking: Reported on 07/11/2019 08/20/17   Medina-Vargas, Monina C, NP  docusate sodium (COLACE) 100 MG capsule Take 1 capsule (100 mg total) by mouth 2 (two) times daily. Patient not taking: Reported on 11/11/2018 05/16/17   Elease Etienne, MD  escitalopram (LEXAPRO) 10 MG tablet Take 1 tablet (10 mg total) by mouth daily. Patient not taking: Reported on 11/11/2018 08/20/17   Medina-Vargas, Monina C, NP  Lacosamide (VIMPAT) 100 MG TABS Take 1 tablet (100 mg total) by mouth in the morning and at bedtime. 07/11/19   Ilani Otterson A, PA-C  meloxicam (MOBIC) 7.5 MG tablet Take 1 tablet (7.5 mg total) by mouth every 12 (twelve) hours. Patient not taking: Reported on 11/11/2018 08/20/17   Medina-Vargas, Monina C, NP  vitamin C (VITAMIN C) 500 MG tablet Take 1 tablet (500 mg total) by mouth daily. Patient not taking: Reported on 11/11/2018 05/16/17   [name], PA-C    Allergies    Penicillins  Review of Systems   Review of Systems  Unable to perform ROS: Dementia    Physical Exam Updated Vital Signs BP (!) 139/106   Pulse 73   Temp 98 F (36.7 C) (Oral)   Resp (!) 21   SpO2 99%   Physical Exam Vitals and nursing note reviewed.  Constitutional:      General: He is not in acute distress.    Appearance: He is well-developed.  HENT:     Head: Normocephalic and atraumatic.  Eyes:     General:        Right eye: No discharge.        Left eye: No discharge.     Extraocular  Movements: Extraocular movements intact.     Conjunctiva/sclera: Conjunctivae normal.     Pupils: Pupils are equal, round, and reactive to light.  Neck:     Vascular: No JVD.     Trachea: No tracheal deviation.  Cardiovascular:     Rate and Rhythm: Normal rate.     Pulses: Normal pulses.     Comments: 2+ pitting edema of the bilateral lower extremity Pulmonary:     Effort: Pulmonary effort is normal.     Breath sounds: Normal breath sounds.  Abdominal:     General: There is no distension.  Palpations: Abdomen is soft.  Musculoskeletal:     Cervical back: Neck supple.  Skin:    General: Skin is warm and dry.     Findings: No erythema.  Neurological:     Mental Status: He is alert.     Comments: Patient has difficulty answering questions, no difficulty following commands.  Cranial nerves are grossly intact.  He moves all extremities spontaneously without difficulty.  Sensation intact to light touch of face and extremities  Psychiatric:        Behavior: Behavior normal.     ED Results / Procedures / Treatments   Labs (all labs ordered are listed, but only abnormal results are displayed) Labs Reviewed  COMPREHENSIVE METABOLIC PANEL - Abnormal; Notable for the following components:      Result Value   Total Protein 8.4 (*)    All other components within normal limits  CBC WITH DIFFERENTIAL/PLATELET - Abnormal; Notable for the following components:   RDW 15.7 (*)    All other components within normal limits  URINALYSIS, ROUTINE W REFLEX MICROSCOPIC - Abnormal; Notable for the following components:   Hgb urine dipstick SMALL (*)    All other components within normal limits  URINE CULTURE  BRAIN NATRIURETIC PEPTIDE    EKG EKG Interpretation  Date/Time:  Sunday July 11 2019 22:58:15 EDT Ventricular Rate:  69 PR Interval:    QRS Duration: 83 QT Interval:  405 QTC Calculation: 434 R Axis:   56 Text Interpretation: Sinus rhythm Abnormal R-wave progression, early  transition Confirmed by Raeford Razor [phone]) on 07/11/2019 11:01:33 PM   Radiology DG Chest 2 View  Result Date: 07/11/2019 CLINICAL DATA:  Altered mental status EXAM: CHEST - 2 VIEW COMPARISON:  08/22/2016 FINDINGS: Cardiomegaly. Left base atelectasis. Right lung clear. No effusions or edema. No acute bony abnormality. Advanced degenerative changes in the shoulders. IMPRESSION: Cardiomegaly.  Left base atelectasis. Electronically Signed   By: Charlett Nose M.D.   On: 07/11/2019 20:07   CT Head Wo Contrast  Result Date: 07/11/2019 CLINICAL DATA:  Altered mental status EXAM: CT HEAD WITHOUT CONTRAST TECHNIQUE: Contiguous axial images were obtained from the base of the skull through the vertex without intravenous contrast. COMPARISON:  05/13/2017 FINDINGS: Brain: There is atrophy and chronic small vessel disease changes. Old left basal ganglia and periventricular white matter lacunar infarcts. No acute intracranial abnormality. Specifically, no hemorrhage, hydrocephalus, mass lesion, acute infarction, or significant intracranial injury. Vascular: No hyperdense vessel or unexpected calcification. Skull: No acute calvarial abnormality. Sinuses/Orbits: No acute findings Other: None IMPRESSION: Old left basal ganglia and periventricular lacunar infarcts. Atrophy, chronic microvascular disease. No acute intracranial abnormality. Electronically Signed   By: Charlett Nose M.D.   On: 07/11/2019 19:58    Procedures Procedures (including critical care time)  Medications Ordered in ED Medications  lacosamide (VIMPAT) tablet 100 mg (100 mg Oral Given 07/11/19 2310)    ED Course  I have reviewed the triage vital signs and the nursing notes.  Pertinent labs & imaging results that were available during my care of the patient were reviewed by me and considered in my medical decision making (see chart for details).  Clinical Course as of Jul 12 30  Sun Jul 11, 2019  389 85 year old male with a history of  seizures formally on valproic acid, no longer, history of stroke, history of dementia, presented to emergency department with daughters expressing concerns for staring spell.  She reports that the patient has been having intermittent staring spells lasting a  few seconds, for many months, but worsening since he stopped the valproic acid a few months ago.  He said his doctor took him off of that (from the [place] [facility]) due to GI upset and behavioral issues.  Tonight the patient had a much more prolonged staring spell, however he appeared confused afterwards, did not seem to recognize her, which she says is completely off his baseline.  Since arriving in the emergency department she reports he is back to his normal baseline.  Patient is pleasantly demented on exam.  He has no focal new neurological deficits.  CT head shows no acute findings.  Labs including UA are unremarkable.  PA provider Brett Canales discussed the case with neurology, as I am suspicious the patient is still having small seizures, and they recommended initiation of AED.  We'll do this and I anticipate discharge home, with patient f/u with their former neurologist   [MT]    Clinical Course User Index [MT] Trifan, Kermit Balo, MD   MDM Rules/Calculators/A&P                          Patient presenting brought in by EMS for evaluation of confusion.  He has a history of dementia, prior CVA and seizures.  He is afebrile, vital signs are stable.  He is nontoxic in appearance.  No focal neurologic deficits noted.  Per daughter's report he is at baseline at this time.  He is pleasantly demented.  She does note that he has been having episodes where and he stares off into space and is drooling and and becomes confused more so than usual.  Lab work reviewed and interpreted by myself shows no leukocytosis, no anemia, no metabolic derangements, no renal insufficiency.  UA does not suggest UTI.  EKG shows normal sinus rhythm, no acute ischemic abnormalities.   Chest x-ray shows no evidence of pneumonia but does show cardiomegaly and left base atelectasis.  No history of cough or fever per daughter and I have a low suspicion of pneumonia at this time.  Head CT shows remote infarcts but no acute intracranial abnormalities.  No infectious cause identified at this time to explain the patient's altered mental status.  The history provided by the daughter is suggestive of potential seizure-like activity, specifically absence seizures or complex partial seizures.  CONSULT: Spoke with Dr. Laurence Slate with neurology, reviewed the patient's history and work-up today.  He recommends starting the patient on Vimpat 100 mg twice daily if there are no financial barriers to getting this medication.  Alternatively he recommends 500 mg Keppra twice daily.  Patient's daughter states that the patient can get his medications filled at the [place].  I recommend outpatient follow-up with his PCP or neurology through the [place].  Patient's daughter would like to take him home which I think is reasonable as I do not see any reason to admit him to the hospital at this time and no further emergent work-up is indicated given he is back to baseline.  Discussed strict ED return precautions.  Patient's daughter verbalized understanding of and agreement with plan and patient is stable for discharge at this time.  Final Clinical Impression(s) / ED Diagnoses Final diagnoses:  Abnormal behavior  Seizure-like activity (HCC)    Rx / DC Orders ED Discharge Orders         Ordered    Lacosamide (VIMPAT) 100 MG TABS  2 times daily     Discontinue  Reprint  07/11/19 2305           Renita Papa, PA-C 07/12/19 0036    Wyvonnia Dusky, MD 07/12/19 1321

## 2019-07-11 NOTE — ED Triage Notes (Signed)
His daughter told EMS that pt. Has hx of stroke and dementia. Those diagnoses notwithstanding, she states pt. Has "been more confused than usual". He arrives awake, alert, confused and in no distress. He is  Pleasant.

## 2019-07-13 LAB — URINE CULTURE

## 2019-09-28 ENCOUNTER — Emergency Department (HOSPITAL_COMMUNITY)
Admission: EM | Admit: 2019-09-28 | Discharge: 2019-09-28 | Disposition: A | Discharge status: Home / Self Care | Payer: No Typology Code available for payment source | Attending: Emergency Medicine | Admitting: Emergency Medicine

## 2019-09-28 ENCOUNTER — Encounter (HOSPITAL_COMMUNITY): Payer: Self-pay

## 2019-09-28 DIAGNOSIS — L03116 Cellulitis of left lower limb: Secondary | ICD-10-CM | POA: Diagnosis not present

## 2019-09-28 DIAGNOSIS — L03115 Cellulitis of right lower limb: Secondary | ICD-10-CM | POA: Insufficient documentation

## 2019-09-28 DIAGNOSIS — F0390 Unspecified dementia without behavioral disturbance: Secondary | ICD-10-CM | POA: Insufficient documentation

## 2019-09-28 DIAGNOSIS — R531 Weakness: Secondary | ICD-10-CM | POA: Insufficient documentation

## 2019-09-28 LAB — BASIC METABOLIC PANEL
Anion gap: 8 (ref 5–15)
BUN: 22 mg/dL (ref 8–23)
CO2: 24 mmol/L (ref 22–32)
Calcium: 9.1 mg/dL (ref 8.9–10.3)
Chloride: 107 mmol/L (ref 98–111)
Creatinine, Ser: 0.95 mg/dL (ref 0.61–1.24)
GFR calc Af Amer: >60 mL/min (ref >60)
GFR calc non Af Amer: >60 mL/min (ref >60)
Glucose, Bld: 85 mg/dL (ref 70–99)
Potassium: 4.4 mmol/L (ref 3.5–5.1)
Sodium: 139 mmol/L (ref 135–145)

## 2019-09-28 LAB — CBC WITH DIFFERENTIAL/PLATELET
Abs Immature Granulocytes: 0.02 10*3/uL (ref 0.00–0.07)
Basophils Absolute: 0.1 10*3/uL (ref 0.0–0.1)
Basophils Relative: 1 %
Eosinophils Absolute: 0.3 10*3/uL (ref 0.0–0.5)
Eosinophils Relative: 4 %
HCT: 40.4 % (ref 39.0–52.0)
Hemoglobin: 12.5 g/dL — ABNORMAL LOW (ref 13.0–17.0)
Immature Granulocytes: 0 %
Lymphocytes Relative: 23 %
Lymphs Abs: 1.7 10*3/uL (ref 0.7–4.0)
MCH: 26.8 pg (ref 26.0–34.0)
MCHC: 30.9 g/dL (ref 30.0–36.0)
MCV: 86.5 fL (ref 80.0–100.0)
Monocytes Absolute: 0.7 10*3/uL (ref 0.1–1.0)
Monocytes Relative: 9 %
Neutro Abs: 5 10*3/uL (ref 1.7–7.7)
Neutrophils Relative %: 63 %
Platelets: 197 10*3/uL (ref 150–400)
RBC: 4.67 MIL/uL (ref 4.22–5.81)
RDW: 15.8 % — ABNORMAL HIGH (ref 11.5–15.5)
WBC: 7.7 10*3/uL (ref 4.0–10.5)
nRBC: 0 % (ref 0.0–0.2)

## 2019-09-28 MED ORDER — SULFAMETHOXAZOLE-TRIMETHOPRIM 800-160 MG PO TABS
1.0000 | ORAL_TABLET | Freq: Once | ORAL | Status: AC
  Administered 2019-09-28: 1 via ORAL
  Filled 2019-09-28: qty 1

## 2019-09-28 MED ORDER — MUPIROCIN CALCIUM 2 % EX CREA: 1.0000 "application " | TOPICAL_CREAM | Freq: Two times a day (BID) | CUTANEOUS | 0 refills | Status: DC

## 2019-09-28 MED ORDER — SULFAMETHOXAZOLE-TRIMETHOPRIM 800-160 MG PO TABS: 1.0000 | ORAL_TABLET | Freq: Two times a day (BID) | ORAL | 0 refills | Status: AC

## 2019-09-28 NOTE — ED Triage Notes (Addendum)
Pt presents from with c/o pressure ulcers to both inner thighs. Per EMS, wounds are reddened and possibly stage 2. Pt is alert per his baseline, hx of dementia. Pt does have a hx of stroke, right-sided deficits present. Pt is bed-bound.

## 2019-09-28 NOTE — Discharge Instructions (Addendum)
As discussed, you have been diagnosed with cellulitis.  The wounds on your thighs should improve with the oral and topical antibiotics you have been prescribed. Please use them as directed, and be sure to follow-up with your physician in 5 days for repeat evaluation.  Return here for concerning changes in your condition.

## 2019-09-28 NOTE — ED Notes (Signed)
[facility] called for transport home. 

## 2019-09-28 NOTE — ED Provider Notes (Signed)
[facility]-EMERGENCY DEPT Provider Note   CSN: [id number] Arrival date & time: 09/28/19  1427     History Chief Complaint  Patient presents with  . Pressure Ulcer    GAY RAPE is a 85 y.o. male.  HPI    Patient with a history of dementia presents with concern of sores on bilateral inner thigh. Patient with dementia limits some of his history provision, level 5 caveat.  Unclear onset, but at least day the patient has noticed redness, pain in both mid medial thighs. He states that he is not feverish, has no other pain, seemingly denies other complaints. No report of fever at home, confusion, disorientation. Family member is reportedly inbound to assist with additional details.  Past Medical History:  Diagnosis Date  . Asthma   . Dementia (HCC)   . Hypertension   . Seizures (HCC)   . Stroke [facility])     Patient Active Problem List   Diagnosis Date Noted  . Protein-calorie malnutrition (HCC) 05/20/2017  . TIA (transient ischemic attack) 05/20/2017  . 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 Seizures (HCC) 05/19/2017  . AKI (acute kidney injury) (HCC) 05/12/2017  . Stroke (HCC) 05/11/2017  . Femur fracture (HCC) 05/11/2017  . Chest pain 02/09/2016  . HTN (hypertension) 02/09/2016  . Dementia with behavioral disturbance (HCC) 02/09/2016  . Chest pain at rest 02/09/2016  . Cerebral infarction (HCC) 02/01/2014  . Benign essential HTN 02/01/2014    Past Surgical History:  Procedure Laterality Date  . BACK SURGERY    . FEMUR IM NAIL Left 05/13/2017   Procedure: INTRAMEDULLARY (IM) NAIL FEMORAL;  Surgeon: [name], MD;  Location: [facility] OR;  Service: Orthopedics;  Laterality: Left;  . KNEE SURGERY         Family History  Problem Relation Age of Onset  . Hyperlipidemia Mother   . Hypertension Mother     Social History   Tobacco Use  . Smoking status: Former Games developer  . Smokeless tobacco: Never Used  Substance Use Topics  . Alcohol  use: No  . Drug use: No    Home Medications Prior to Admission medications   Medication Sig Start Date End Date Taking? Authorizing Provider  acetaminophen (TYLENOL) 325 MG tablet Take 2 tablets (650 mg total) by mouth every 6 (six) hours as needed for mild pain or moderate pain (or temp > 37.5 C (99.5 F)). 05/16/17   [name], PA-C  aspirin 325 MG tablet Take 1 tablet (325 mg total) by mouth daily. 05/17/17   Hongalgi, Maximino Greenland, MD  atorvastatin (LIPITOR) 80 MG tablet Take 1 tablet (80 mg total) by mouth daily at 6 PM. Patient not taking: Reported on 07/11/2019 08/20/17   Medina-Vargas, Monina C, NP  calcium citrate (CALCITRATE - DOSED IN MG ELEMENTAL CALCIUM) 950 MG tablet Take 1 tablet (200 mg of elemental calcium total) by mouth 2 (two) times daily. Patient not taking: Reported on 11/11/2018 08/20/17   Medina-Vargas, Avanell Shackleton C, NP  Cholecalciferol 2000 units TABS Take 1 tablet (2,000 Units total) by mouth daily. Patient not taking: Reported on 11/11/2018 08/20/17   Medina-Vargas, Monina C, NP  divalproex (DEPAKOTE ER) 500 MG 24 hr tablet Take 1 tablet (500 mg total) by mouth at bedtime. Patient not taking: Reported on 07/11/2019 08/20/17   Medina-Vargas, Monina C, NP  docusate sodium (COLACE) 100 MG capsule Take 1 capsule (100 mg total) by mouth 2 (two) times daily. Patient not taking: Reported on 11/11/2018 05/16/17  Elease Etienne, MD  escitalopram (LEXAPRO) 10 MG tablet Take 1 tablet (10 mg total) by mouth daily. Patient not taking: Reported on 11/11/2018 08/20/17   Medina-Vargas, Monina C, NP  Lacosamide (VIMPAT) 100 MG TABS Take 1 tablet (100 mg total) by mouth in the morning and at bedtime. 07/11/19   Fawze, Mina A, PA-C  meloxicam (MOBIC) 7.5 MG tablet Take 1 tablet (7.5 mg total) by mouth every 12 (twelve) hours. Patient not taking: Reported on 11/11/2018 08/20/17   Medina-Vargas, Monina C, NP  vitamin C (VITAMIN C) 500 MG tablet Take 1 tablet (500 mg total) by mouth daily. Patient not  taking: Reported on 11/11/2018 05/16/17   [name], PA-C    Allergies    Penicillins  Review of Systems   Review of Systems  Unable to perform ROS: Dementia  Constitutional:       Per HPI, otherwise negative  HENT:       Per HPI, otherwise negative  Respiratory:       Per HPI, otherwise negative  Cardiovascular:       Per HPI, otherwise negative  Gastrointestinal: Negative for vomiting.  Endocrine:       Negative aside from HPI  Genitourinary:       Neg aside from HPI   Musculoskeletal:       Per HPI, otherwise negative  Skin: Positive for color change and wound.  Neurological: Positive for weakness. Negative for syncope.    Physical Exam Updated Vital Signs BP (!) 133/93 (BP Location: Right Arm)   Pulse (!) 56   Temp 97.6 F (36.4 C) (Oral)   Resp 20   SpO2 98%   Physical Exam Vitals and nursing note reviewed.  Constitutional:      Appearance: He is well-developed.     Comments: Sickly appearing adult male sitting upright speaking slowly, but clearly.  HENT:     Head: Normocephalic and atraumatic.  Eyes:     Conjunctiva/sclera: Conjunctivae normal.  Cardiovascular:     Rate and Rhythm: Normal rate and regular rhythm.  Pulmonary:     Effort: Pulmonary effort is normal. No respiratory distress.     Breath sounds: No stridor.  Abdominal:     General: There is no distension.  Skin:    General: Skin is warm and dry.       Neurological:     Mental Status: He is alert.     Motor: Atrophy present.  Psychiatric:     Comments: Though the patient cannot provide longer-term details, he is seemingly oriented to his current situation, place, self.     ED Results / Procedures / Treatments   Labs (all labs ordered are listed, but only abnormal results are displayed) Labs Reviewed  CBC WITH DIFFERENTIAL/PLATELET - Abnormal; Notable for the following components:      Result Value   Hemoglobin 12.5 (*)    RDW 15.8 (*)    All other components within normal  limits  BASIC METABOLIC PANEL     Procedures Procedures (including critical care time)  Medications Ordered in ED Medications  sulfamethoxazole-trimethoprim (BACTRIM DS) 800-160 MG per tablet 1 tablet (1 tablet Oral Given 09/28/19 1708)    ED Course  I have reviewed the triage vital signs and the nursing notes.  Pertinent labs & imaging results that were available during my care of the patient were reviewed by me and considered in my medical decision making (see chart for details).    MDM Rules/Calculators/A&P  7:04 PM 100 exam patient is awake, alert, sitting upright, speaking clearly.  Vitals unremarkable, no hypotension, no fever. Labs reviewed, discussed, no evidence for bacteremia, sepsis. Patient does have bilateral areas on his legs concern for cellulitis, but given the absence of other alarming findings, is appropriate for initiation of antibiotics, which have been started, discharged with outpatient follow-up. Final Clinical Impression(s) / ED Diagnoses Final diagnoses:  Cellulitis of both lower extremities  MDM Number of Diagnoses or Management Options Cellulitis of both lower extremities: new, needed workup   Amount and/or Complexity of Data Reviewed Clinical lab tests: reviewed Tests in the medicine section of CPT: reviewed Obtain history from someone other than the patient: yes  Risk of Complications, Morbidity, and/or Mortality Presenting problems: high Diagnostic procedures: high Management options: high  Critical Care Total time providing critical care: < 30 minutes  Patient Progress Patient progress: stable   Rx / DC Orders ED Discharge Orders         Ordered    sulfamethoxazole-trimethoprim (BACTRIM DS) 800-160 MG tablet  2 times daily        09/28/19 1907    mupirocin cream (BACTROBAN) 2 %  2 times daily        09/28/19 1907           Gerhard Munch, MD 09/28/19 Windell Moment

## 2019-09-28 NOTE — ED Notes (Signed)
Daughter updated on patients care and discharge.

## 2019-10-06 ENCOUNTER — Telehealth: Payer: Self-pay | Admitting: *Deleted

## 2019-10-06 NOTE — Telephone Encounter (Signed)
TOC CM received call from [facility], Aram Beecham, states they need the RX faxed to [place]. Spoke to ED provider and unable to print out once pt has been dc. [facility] not [facility] [employer] or called in RX.  Contacted dtr, Asher Muir and explained she will need to pick up at [facility]. CM faxed goodrx coupons to pharmacy, $24 for Bactroban, and $10 for Bactrim. Explained to dtr process for getting meds to pharmacy in the future from [place]. Explained she can have the RN fax to [place] prior to pt leaving Provided her with fax number. States she will pick up meds for [facility]. Isidoro Donning RN CCM, [facility] ED TOC CM [phone]

## 2019-12-14 ENCOUNTER — Inpatient Hospital Stay (HOSPITAL_COMMUNITY)
Admission: EM | Admit: 2019-12-14 | Discharge: 2019-12-20 | DRG: 553 | Disposition: A | Discharge status: Skilled Nursing Facility | Payer: Medicare Other | Attending: Student | Admitting: Student

## 2019-12-14 ENCOUNTER — Other Ambulatory Visit: Payer: Self-pay

## 2019-12-14 ENCOUNTER — Emergency Department (HOSPITAL_COMMUNITY): Payer: Medicare Other

## 2019-12-14 ENCOUNTER — Observation Stay (HOSPITAL_COMMUNITY): Payer: Medicare Other

## 2019-12-14 ENCOUNTER — Encounter (HOSPITAL_COMMUNITY): Payer: Self-pay | Admitting: Internal Medicine

## 2019-12-14 DIAGNOSIS — M7989 Other specified soft tissue disorders: Secondary | ICD-10-CM | POA: Diagnosis present

## 2019-12-14 DIAGNOSIS — M25539 Pain in unspecified wrist: Secondary | ICD-10-CM | POA: Diagnosis present

## 2019-12-14 DIAGNOSIS — Z20822 Contact with and (suspected) exposure to covid-19: Secondary | ICD-10-CM | POA: Diagnosis not present

## 2019-12-14 DIAGNOSIS — Z880 Allergy status to penicillin: Secondary | ICD-10-CM

## 2019-12-14 DIAGNOSIS — L03114 Cellulitis of left upper limb: Secondary | ICD-10-CM | POA: Diagnosis present

## 2019-12-14 DIAGNOSIS — Z87891 Personal history of nicotine dependence: Secondary | ICD-10-CM

## 2019-12-14 DIAGNOSIS — M109 Gout, unspecified: Secondary | ICD-10-CM | POA: Diagnosis not present

## 2019-12-14 DIAGNOSIS — G40909 Epilepsy, unspecified, not intractable, without status epilepticus: Secondary | ICD-10-CM | POA: Diagnosis present

## 2019-12-14 DIAGNOSIS — M25559 Pain in unspecified hip: Secondary | ICD-10-CM

## 2019-12-14 DIAGNOSIS — R7982 Elevated C-reactive protein (CRP): Secondary | ICD-10-CM | POA: Diagnosis present

## 2019-12-14 DIAGNOSIS — I69354 Hemiplegia and hemiparesis following cerebral infarction affecting left non-dominant side: Secondary | ICD-10-CM | POA: Diagnosis not present

## 2019-12-14 DIAGNOSIS — Z7982 Long term (current) use of aspirin: Secondary | ICD-10-CM

## 2019-12-14 DIAGNOSIS — M25532 Pain in left wrist: Secondary | ICD-10-CM | POA: Diagnosis not present

## 2019-12-14 DIAGNOSIS — S32402A Unspecified fracture of left acetabulum, initial encounter for closed fracture: Secondary | ICD-10-CM

## 2019-12-14 DIAGNOSIS — M2120 Flexion deformity, unspecified site: Secondary | ICD-10-CM | POA: Diagnosis present

## 2019-12-14 DIAGNOSIS — Z8249 Family history of ischemic heart disease and other diseases of the circulatory system: Secondary | ICD-10-CM

## 2019-12-14 DIAGNOSIS — R569 Unspecified convulsions: Secondary | ICD-10-CM

## 2019-12-14 DIAGNOSIS — R6510 Systemic inflammatory response syndrome (SIRS) of non-infectious origin without acute organ dysfunction: Secondary | ICD-10-CM | POA: Diagnosis present

## 2019-12-14 DIAGNOSIS — Z79899 Other long term (current) drug therapy: Secondary | ICD-10-CM

## 2019-12-14 DIAGNOSIS — Z83438 Family history of other disorder of lipoprotein metabolism and other lipidemia: Secondary | ICD-10-CM

## 2019-12-14 DIAGNOSIS — E440 Moderate protein-calorie malnutrition: Secondary | ICD-10-CM | POA: Diagnosis present

## 2019-12-14 DIAGNOSIS — F0391 Unspecified dementia with behavioral disturbance: Secondary | ICD-10-CM | POA: Diagnosis present

## 2019-12-14 DIAGNOSIS — F39 Unspecified mood [affective] disorder: Secondary | ICD-10-CM | POA: Diagnosis present

## 2019-12-14 DIAGNOSIS — M25551 Pain in right hip: Secondary | ICD-10-CM | POA: Diagnosis not present

## 2019-12-14 DIAGNOSIS — J45909 Unspecified asthma, uncomplicated: Secondary | ICD-10-CM | POA: Diagnosis present

## 2019-12-14 DIAGNOSIS — Z6822 Body mass index (BMI) 22.0-22.9, adult: Secondary | ICD-10-CM

## 2019-12-14 DIAGNOSIS — I10 Essential (primary) hypertension: Secondary | ICD-10-CM | POA: Diagnosis not present

## 2019-12-14 DIAGNOSIS — R700 Elevated erythrocyte sedimentation rate: Secondary | ICD-10-CM | POA: Diagnosis present

## 2019-12-14 DIAGNOSIS — F03918 Unspecified dementia, unspecified severity, with other behavioral disturbance: Secondary | ICD-10-CM | POA: Diagnosis present

## 2019-12-14 DIAGNOSIS — R54 Age-related physical debility: Secondary | ICD-10-CM | POA: Diagnosis present

## 2019-12-14 LAB — URINALYSIS, ROUTINE W REFLEX MICROSCOPIC
Bilirubin Urine: NEGATIVE
Glucose, UA: NEGATIVE mg/dL
Ketones, ur: NEGATIVE mg/dL
Leukocytes,Ua: NEGATIVE
Nitrite: NEGATIVE
Protein, ur: 30 mg/dL — AB
Specific Gravity, Urine: 1.04 — ABNORMAL HIGH (ref 1.005–1.030)
pH: 5 (ref 5.0–8.0)

## 2019-12-14 LAB — SEDIMENTATION RATE: Sed Rate: 108 mm/hr — ABNORMAL HIGH (ref 0–16)

## 2019-12-14 LAB — C-REACTIVE PROTEIN: CRP: 11.2 mg/dL — ABNORMAL HIGH (ref <1.0)

## 2019-12-14 LAB — CBC WITH DIFFERENTIAL/PLATELET
Abs Immature Granulocytes: 0.08 10*3/uL — ABNORMAL HIGH (ref 0.00–0.07)
Basophils Absolute: 0 10*3/uL (ref 0.0–0.1)
Basophils Relative: 0 %
Eosinophils Absolute: 0 10*3/uL (ref 0.0–0.5)
Eosinophils Relative: 0 %
HCT: 40.7 % (ref 39.0–52.0)
Hemoglobin: 13 g/dL (ref 13.0–17.0)
Immature Granulocytes: 1 %
Lymphocytes Relative: 4 %
Lymphs Abs: 0.5 10*3/uL — ABNORMAL LOW (ref 0.7–4.0)
MCH: 26.1 pg (ref 26.0–34.0)
MCHC: 31.9 g/dL (ref 30.0–36.0)
MCV: 81.7 fL (ref 80.0–100.0)
Monocytes Absolute: 0.9 10*3/uL (ref 0.1–1.0)
Monocytes Relative: 6 %
Neutro Abs: 12.6 10*3/uL — ABNORMAL HIGH (ref 1.7–7.7)
Neutrophils Relative %: 89 %
Platelets: 238 10*3/uL (ref 150–400)
RBC: 4.98 MIL/uL (ref 4.22–5.81)
RDW: 15.8 % — ABNORMAL HIGH (ref 11.5–15.5)
WBC: 14.1 10*3/uL — ABNORMAL HIGH (ref 4.0–10.5)
nRBC: 0 % (ref 0.0–0.2)

## 2019-12-14 LAB — COMPREHENSIVE METABOLIC PANEL
ALT: 24 U/L (ref 0–44)
AST: 25 U/L (ref 15–41)
Albumin: 3.5 g/dL (ref 3.5–5.0)
Alkaline Phosphatase: 76 U/L (ref 38–126)
Anion gap: 13 (ref 5–15)
BUN: 23 mg/dL (ref 8–23)
CO2: 27 mmol/L (ref 22–32)
Calcium: 10.1 mg/dL (ref 8.9–10.3)
Chloride: 98 mmol/L (ref 98–111)
Creatinine, Ser: 1.21 mg/dL (ref 0.61–1.24)
GFR, Estimated: 59 mL/min — ABNORMAL LOW (ref >60)
Glucose, Bld: 110 mg/dL — ABNORMAL HIGH (ref 70–99)
Potassium: 4 mmol/L (ref 3.5–5.1)
Sodium: 138 mmol/L (ref 135–145)
Total Bilirubin: 0.8 mg/dL (ref 0.3–1.2)
Total Protein: 8.9 g/dL — ABNORMAL HIGH (ref 6.5–8.1)

## 2019-12-14 LAB — LACTIC ACID, PLASMA
Lactic Acid, Venous: 2.1 mmol/L (ref 0.5–1.9)
Lactic Acid, Venous: 2 mmol/L (ref 0.5–1.9)

## 2019-12-14 LAB — RESPIRATORY PANEL BY RT PCR (FLU A&B, COVID)
Influenza A by PCR: NEGATIVE
Influenza B by PCR: NEGATIVE
SARS Coronavirus 2 by RT PCR: NEGATIVE

## 2019-12-14 LAB — CBG MONITORING, ED: Glucose-Capillary: 98 mg/dL (ref 70–99)

## 2019-12-14 LAB — PROCALCITONIN: Procalcitonin: 0.34 ng/mL

## 2019-12-14 LAB — LIPASE, BLOOD: Lipase: 27 U/L (ref 11–51)

## 2019-12-14 MED ORDER — ENOXAPARIN SODIUM 30 MG/0.3ML ~~LOC~~ SOLN
30.0000 mg | SUBCUTANEOUS | Status: DC
  Administered 2019-12-15: 30 mg via SUBCUTANEOUS
  Filled 2019-12-14: qty 0.3

## 2019-12-14 MED ORDER — ONDANSETRON HCL 4 MG/2ML IJ SOLN: 4.0000 mg | Freq: Four times a day (QID) | INTRAMUSCULAR | Status: DC | PRN

## 2019-12-14 MED ORDER — ACETAMINOPHEN 325 MG PO TABS: 650.0000 mg | ORAL_TABLET | Freq: Four times a day (QID) | ORAL | Status: DC | PRN

## 2019-12-14 MED ORDER — ENOXAPARIN SODIUM 40 MG/0.4ML ~~LOC~~ SOLN: 40.0000 mg | SUBCUTANEOUS | Status: DC

## 2019-12-14 MED ORDER — SODIUM CHLORIDE 0.9 % IV SOLN
2.0000 g | Freq: Once | INTRAVENOUS | Status: AC
  Administered 2019-12-14: 2 g via INTRAVENOUS
  Filled 2019-12-14: qty 2

## 2019-12-14 MED ORDER — HYDROCERIN EX CREA
TOPICAL_CREAM | Freq: Two times a day (BID) | CUTANEOUS | Status: DC | PRN
  Filled 2019-12-14: qty 113

## 2019-12-14 MED ORDER — LACTATED RINGERS IV BOLUS
1000.0000 mL | Freq: Once | INTRAVENOUS | Status: AC
  Administered 2019-12-14: 1000 mL via INTRAVENOUS

## 2019-12-14 MED ORDER — SODIUM CHLORIDE (PF) 0.9 % IJ SOLN
INTRAMUSCULAR | Status: AC
  Filled 2019-12-14: qty 50

## 2019-12-14 MED ORDER — BISACODYL 5 MG PO TBEC: 5.0000 mg | DELAYED_RELEASE_TABLET | Freq: Every day | ORAL | Status: DC | PRN

## 2019-12-14 MED ORDER — HYDROMORPHONE HCL 1 MG/ML IJ SOLN
0.5000 mg | INTRAMUSCULAR | Status: DC | PRN
  Administered 2019-12-15 – 2019-12-20 (×2): 0.5 mg via INTRAVENOUS
  Filled 2019-12-14 (×2): qty 0.5

## 2019-12-14 MED ORDER — VANCOMYCIN HCL 750 MG/150ML IV SOLN
750.0000 mg | INTRAVENOUS | Status: DC
  Filled 2019-12-14: qty 150

## 2019-12-14 MED ORDER — ATORVASTATIN CALCIUM 40 MG PO TABS
80.0000 mg | ORAL_TABLET | Freq: Every day | ORAL | Status: DC
  Administered 2019-12-15 – 2019-12-19 (×5): 80 mg via ORAL
  Filled 2019-12-14 (×5): qty 2

## 2019-12-14 MED ORDER — FENTANYL CITRATE (PF) 100 MCG/2ML IJ SOLN
50.0000 ug | Freq: Once | INTRAMUSCULAR | Status: AC
  Administered 2019-12-14: 50 ug via INTRAVENOUS
  Filled 2019-12-14: qty 2

## 2019-12-14 MED ORDER — SODIUM CHLORIDE 0.9 % IV SOLN
2.0000 g | Freq: Two times a day (BID) | INTRAVENOUS | Status: DC
  Administered 2019-12-15: 2 g via INTRAVENOUS
  Filled 2019-12-14 (×3): qty 2

## 2019-12-14 MED ORDER — ONDANSETRON HCL 4 MG PO TABS: 4.0000 mg | ORAL_TABLET | Freq: Four times a day (QID) | ORAL | Status: DC | PRN

## 2019-12-14 MED ORDER — ACETAMINOPHEN 650 MG RE SUPP: 650.0000 mg | Freq: Four times a day (QID) | RECTAL | Status: DC | PRN

## 2019-12-14 MED ORDER — VANCOMYCIN HCL IN DEXTROSE 1-5 GM/200ML-% IV SOLN
1000.0000 mg | Freq: Once | INTRAVENOUS | Status: AC
  Administered 2019-12-14: 1000 mg via INTRAVENOUS
  Filled 2019-12-14: qty 200

## 2019-12-14 MED ORDER — MORPHINE SULFATE (PF) 4 MG/ML IV SOLN
4.0000 mg | Freq: Once | INTRAVENOUS | Status: AC
  Administered 2019-12-14: 4 mg via INTRAVENOUS
  Filled 2019-12-14: qty 1

## 2019-12-14 MED ORDER — KETOROLAC TROMETHAMINE 15 MG/ML IJ SOLN
15.0000 mg | Freq: Once | INTRAMUSCULAR | Status: AC
  Administered 2019-12-14: 15 mg via INTRAVENOUS
  Filled 2019-12-14: qty 1

## 2019-12-14 MED ORDER — IOHEXOL 300 MG/ML  SOLN
100.0000 mL | Freq: Once | INTRAMUSCULAR | Status: AC | PRN
  Administered 2019-12-14: 100 mL via INTRAVENOUS

## 2019-12-14 MED ORDER — OXYCODONE HCL 5 MG PO TABS
2.5000 mg | ORAL_TABLET | ORAL | Status: DC | PRN
  Administered 2019-12-15 – 2019-12-19 (×3): 2.5 mg via ORAL
  Filled 2019-12-14 (×3): qty 1

## 2019-12-14 MED ORDER — LACTATED RINGERS IV BOLUS (SEPSIS)
1000.0000 mL | Freq: Once | INTRAVENOUS | Status: AC
  Administered 2019-12-14: 1000 mL via INTRAVENOUS

## 2019-12-14 MED ORDER — SENNOSIDES-DOCUSATE SODIUM 8.6-50 MG PO TABS
1.0000 | ORAL_TABLET | Freq: Two times a day (BID) | ORAL | Status: DC
  Administered 2019-12-16 – 2019-12-20 (×9): 1 via ORAL
  Filled 2019-12-14 (×11): qty 1

## 2019-12-14 NOTE — H&P (Signed)
History and Physical    Johnathan Mitchell. QMG:[id number] DOB: Jun 19, 1934 DOA: 12/14/2019  PCP: [facility]   Patient coming from: Home.   I have personally briefly reviewed patient's old medical records in [facility]  Chief Complaint: AMS.  HPI: Johnathan Mitchell. is a 85 y.o. male with medical history significant of asthma, dementia, hypertension, seizures, history of other known embolic CVA with residual left-sided hemiparesis who is brought to the emergency department via EMS due to due to left hand swelling/tenderness for the past few weeks associated with weakness, decreased oral intake and worsening confusion.  He was seen at [facility] where he had a negative x-ray and was put in a brace.  He has also been having lower abdominal and back pain recently.  He is able to answer simple questions, but is unable to elaborate.  ED Course: Initial vital signs were temperature 98.4 F, pulse 99, respirations 20, BP 90/77 mmHg O2 sat 99% on room air.  While in the ED, the patient received cefepime, vancomycin and a 1000 mL LR bolus.  I added another 1000 mL LR bolus over 4 hours.  He received 50 mcg of fentanyl and later 4 mg of morphine IV.  Labs: Urinalysis with hazy with an elevated specific gravity of 1.040, moderate hemoglobinuria, proteinuria 30 mg/dL and rare bacteria on microscopic examination.  Lactic acid was 2.1 and then 2.0 mmol/L.  CBC showed a white count of 14.1 with 89% neutrophils, hemoglobin 13.0 g/dL and platelets 891.  Lipase was normal.  CMP showed a glucose of 110 mg/dL and an elevated total protein of 8.9 g/dL.  The rest of the CMP values were within normal limits.  Imaging: Left hand and wrist x-ray shows diffuse swelling of the with no fracture, dislocation, bone destruction or periosteal reaction or soft tissue gas.  There were DJD changes on the wrist.  1 view chest radiograph did not show any active cardiopulmonary pathology.  Pelvis x-ray showed complex  comminuted left acetabular fracture.  This was again seen on the CT abdomen/pelvis along with known bilateral common iliac aneurysmal dilatation. CT head did not show any acute intracranial pathology, however chronic small vessel ischemic disease changes with multiple chronic small vessel infarcts was again seen.  There is stable moderate cerebral atrophy.  Please see images and full radiology report for further detail.  Review of Systems: As per HPI otherwise all other systems reviewed and are negative.  Past Medical History:  Diagnosis Date  . Asthma   . Dementia (HCC)   . Hypertension   . Seizures (HCC)   . Stroke [facility])     Past Surgical History:  Procedure Laterality Date  . BACK SURGERY    . FEMUR IM NAIL Left 05/13/2017   Procedure: INTRAMEDULLARY (IM) NAIL FEMORAL;  Surgeon: [name], MD;  Location: [facility] OR;  Service: Orthopedics;  Laterality: Left;  . KNEE SURGERY     Social History  reports that he has quit smoking. He has never used smokeless tobacco. He reports that he does not drink alcohol and does not use drugs.  Allergies  Allergen Reactions  . Penicillins Hives and Swelling    Patient couldn't answer any questions   Family History  Problem Relation Age of Onset  . Hyperlipidemia Mother   . Hypertension Mother    Prior to Admission medications   Medication Sig Start Date End Date Taking? Authorizing Provider  acetaminophen (TYLENOL) 500 MG tablet Take 1,000 mg by mouth every  6 (six) hours as needed for moderate pain.   Yes [provider]  atorvastatin (LIPITOR) 80 MG tablet Take 1 tablet (80 mg total) by mouth daily at 6 PM. 08/20/17  Yes Medina-Vargas, Monina C, NP  docusate sodium (COLACE) 100 MG capsule Take 1 capsule (100 mg total) by mouth 2 (two) times daily. Patient taking differently: Take 100 mg by mouth 2 (two) times daily as needed for mild constipation.  05/16/17  Yes Hongalgi, Maximino GreenlandAnand D, MD  HYDROPHILIC EX Apply 1 application topically 2  (two) times daily as needed (moiturizer).   Yes [provider]  MENTHOL-METHYL SALICYLATE EX Apply 1 application topically 4 (four) times daily as needed (pain).   Yes [provider]  acetaminophen (TYLENOL) 325 MG tablet Take 2 tablets (650 mg total) by mouth every 6 (six) hours as needed for mild pain or moderate pain (or temp > 37.5 C (99.5 F)). Patient not taking: Reported on 12/14/2019 05/16/17   Montez MoritaPaul, Keith, PA-C  aspirin 325 MG tablet Take 1 tablet (325 mg total) by mouth daily. Patient not taking: Reported on 12/14/2019 05/17/17   Elease EtienneHongalgi, Anand D, MD  calcium citrate (CALCITRATE - DOSED IN MG ELEMENTAL CALCIUM) 950 MG tablet Take 1 tablet (200 mg of elemental calcium total) by mouth 2 (two) times daily. Patient not taking: Reported on 11/11/2018 08/20/17   Medina-Vargas, Avanell ShackletonMonina C, NP  Cholecalciferol 2000 units TABS Take 1 tablet (2,000 Units total) by mouth daily. Patient not taking: Reported on 11/11/2018 08/20/17   Medina-Vargas, Monina C, NP  divalproex (DEPAKOTE ER) 500 MG 24 hr tablet Take 1 tablet (500 mg total) by mouth at bedtime. Patient not taking: Reported on 07/11/2019 08/20/17   Medina-Vargas, Monina C, NP  escitalopram (LEXAPRO) 10 MG tablet Take 1 tablet (10 mg total) by mouth daily. Patient not taking: Reported on 11/11/2018 08/20/17   Medina-Vargas, Monina C, NP  Lacosamide (VIMPAT) 100 MG TABS Take 1 tablet (100 mg total) by mouth in the morning and at bedtime. Patient not taking: Reported on 12/14/2019 07/11/19   Michela PitcherFawze, Mina A, PA-C  meloxicam (MOBIC) 7.5 MG tablet Take 1 tablet (7.5 mg total) by mouth every 12 (twelve) hours. Patient not taking: Reported on 11/11/2018 08/20/17   Medina-Vargas, Monina C, NP  mupirocin cream (BACTROBAN) 2 % Apply 1 application topically 2 (two) times daily. Patient not taking: Reported on 12/14/2019 09/28/19   Gerhard MunchLockwood, Robert, MD  vitamin C (VITAMIN C) 500 MG tablet Take 1 tablet (500 mg total) by mouth daily. Patient not  taking: Reported on 11/11/2018 05/16/17   Montez MoritaPaul, Keith, PA-C   Physical Exam: Vitals:   12/14/19 1500 12/14/19 1519 12/14/19 1645 12/14/19 1815  BP: (!) 141/91 (!) 141/91 (!) 143/88 (!) 135/93  Pulse: (!) 103 (!) 105 (!) 106 (!) 105  Resp: (!) 23 16 (!) 35 19  Temp:      TempSrc:      SpO2: 91% 92% 100% 100%   Constitutional: Looks chronically ill, but in NAD. Eyes: PERRL, lids and conjunctivae normal ENMT: Mucous membranes are mildly dry. Posterior pharynx clear of any exudate or lesions.  Neck: normal, supple, no masses, no thyromegaly Respiratory: Decreased breath sounds in bases, otherwise clear to auscultation bilaterally, no wheezing, no crackles. Normal respiratory effort. No accessory muscle use.  Cardiovascular: Tachycardic in the 100s with a regular rhythm, no murmurs / rubs / gallops. No extremity edema. 2+ pedal pulses. No carotid bruits.  Abdomen: Nondistended. BS positive. Soft, no tenderness, no masses  palpated. No hepatosplenomegaly. Musculoskeletal: Generalized weakness. Positive erythema, edema, calor and TTP of left hand/left wrist area with some decrease in ROM. No clubbing / cyanosis. Good ROM, no contractures. Normal muscle tone.  Skin: The patient had diapers on. No rashes, lesions, ulcers on very limited dermatological examination. Neurologic: CN 2-12 grossly intact. Sensation intact, DTR normal.  Left-sided 2/5 hemiparesis. Psychiatric: Alert and oriented x 1.  Disoriented to place, time/date and situation.  Labs on Admission: I have personally reviewed following labs and imaging studies  CBC: Recent Labs  Lab 12/14/19 1115  WBC 14.1*  NEUTROABS 12.6*  HGB 13.0  HCT 40.7  MCV 81.7  PLT 238    Basic Metabolic Panel: Recent Labs  Lab 12/14/19 1115  NA 138  K 4.0  CL 98  CO2 27  GLUCOSE 110*  BUN 23  CREATININE 1.21  CALCIUM 10.1   GFR: CrCl cannot be calculated (Unknown ideal weight.).  Liver Function Tests: Recent Labs  Lab 12/14/19 1115   AST 25  ALT 24  ALKPHOS 76  BILITOT 0.8  PROT 8.9*  ALBUMIN 3.5   Urine analysis:    Component Value Date/Time   COLORURINE YELLOW 12/14/2019 1700   APPEARANCEUR HAZY (A) 12/14/2019 1700   LABSPEC 1.040 (H) 12/14/2019 1700   PHURINE 5.0 12/14/2019 1700   GLUCOSEU NEGATIVE 12/14/2019 1700   HGBUR MODERATE (A) 12/14/2019 1700   BILIRUBINUR NEGATIVE 12/14/2019 1700   KETONESUR NEGATIVE 12/14/2019 1700   PROTEINUR 30 (A) 12/14/2019 1700        NITRITE NEGATIVE 12/14/2019 1700   LEUKOCYTESUR NEGATIVE 12/14/2019 1700   Radiological Exams on Admission: DG Chest 1 View  Result Date: 12/14/2019 CLINICAL DATA:  Altered mental status. EXAM: CHEST  1 VIEW COMPARISON:  June 13 21. FINDINGS: Similar cardiac silhouette. No confluent consolidation. No visible pleural effusions or pneumothorax. Advanced bilateral shoulder degenerative change. Multilevel degenerative change of the thoracic spine, suboptimally evaluated without a lateral radiograph. IMPRESSION: No evidence of acute cardiopulmonary disease. Electronically Signed   By: Feliberto Harts MD   On: 12/14/2019 12:28   DG Wrist Complete Left  Result Date: 12/14/2019 CLINICAL DATA:  Altered mental status, pain EXAM: LEFT WRIST - COMPLETE 3+ VIEW COMPARISON:  None. FINDINGS: No displaced fracture or dislocation of the left wrist. Assessment of the carpus is limited by contracted, flexed position of the hand. Within this limitation, there is severe radiocarpal and carpal arthrosis with subchondral lucencies. The carpus is normally aligned. Diffuse soft tissue edema about the hand and wrist. IMPRESSION: 1.  No displaced fracture or dislocation of the left wrist 2. Assessment of the carpus is limited by contracted, flexed position of the hand. Within this limitation, there is severe radiocarpal and carpal arthrosis with subchondral lucencies suggestive of erosive arthropathy such as gout. 3.  Diffuse soft tissue edema about the hand and wrist.  Electronically Signed   By: Lauralyn Primes M.D.   On: 12/14/2019 12:37   CT Head Wo Contrast  Result Date: 12/14/2019 CLINICAL DATA:  Delirium. Additional history provided: Weakness, pain with movement. EXAM: CT HEAD WITHOUT CONTRAST TECHNIQUE: Contiguous axial images were obtained from the base of the skull through the vertex without intravenous contrast. COMPARISON:  Prior head CT 08/10/2019.  Brain MRI 02/01/2014. FINDINGS: Brain: Moderate generalized cerebral atrophy. Redemonstrated chronic infarct within the left corona radiata/basal ganglia. Additional redemonstrated small chronic infarcts within the deep gray nuclei. Background advanced ill-defined hypoattenuation within the cerebral white matter is nonspecific, but compatible with chronic small  vessel ischemic disease. There is no acute intracranial hemorrhage. No demarcated cortical infarct. No extra-axial fluid collection. No evidence of intracranial mass. No midline shift. Vascular: No hyperdense vessel.  Atherosclerotic calcifications. Skull: Normal. Negative for fracture or focal lesion. Sinuses/Orbits: Visualized orbits show no acute finding. Chronic medially displaced deformity of the left lamina papyracea. No significant paranasal sinus disease at the imaged levels. IMPRESSION: No evidence of acute intracranial abnormality. Redemonstrated severe chronic small vessel ischemic disease with multiple chronic small-vessel infarcts. Stable, moderate cerebral atrophy. Electronically Signed   By: Jackey Loge DO   On: 12/14/2019 14:47   CT ABDOMEN PELVIS W CONTRAST  Result Date: 12/14/2019 CLINICAL DATA:  Abdominal pain EXAM: CT ABDOMEN AND PELVIS WITH CONTRAST TECHNIQUE: Multidetector CT imaging of the abdomen and pelvis was performed using the standard protocol following bolus administration of intravenous contrast. CONTRAST:  OMNIPAQUE IOHEXOL 300 MG/ML  SOLN COMPARISON:  None. FINDINGS: Lower chest: The visualized heart size within normal  limits. Coronary artery calcifications are seen. No pericardial fluid/thickening. No hiatal hernia. Streaky atelectasis seen at both lung bases. Hepatobiliary: The liver is normal in density without focal abnormality.The main portal vein is patent. No evidence of calcified gallstones, gallbladder wall thickening or biliary dilatation. Pancreas: Unremarkable. No pancreatic ductal dilatation or surrounding inflammatory changes. Spleen: Normal in size without focal abnormality. Adrenals/Urinary Tract: Both adrenal glands appear normal. The kidneys and collecting system appear normal without evidence of urinary tract calculus or hydronephrosis. Bladder is unremarkable. Stomach/Bowel: The stomach, small bowel, and colon are normal in appearance. No inflammatory changes, wall thickening, or obstructive findings.Moderate stool is present throughout. Vascular/Lymphatic: There are no enlarged mesenteric, retroperitoneal, or pelvic lymph nodes. Scattered aortic atherosclerotic calcifications are seen without aneurysmal dilatation. Again noted is aneurysmal dilatation with partial thrombosis of bilateral iliac vasculature. Reproductive: The prostate is unremarkable. Other: No evidence of abdominal wall mass or hernia. Musculoskeletal: There is a comminuted mildly displaced fracture seen through the anterior and posterior left acetabulum which involve the wall and column. The patient is status post ORIF with intramedullary nail fixation of the left femur. 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 IMPRESSION: Comminuted mildly displaced fractures through the anterior and posterior left acetabulum. Aortic Atherosclerosis (ICD10-I70.0). 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 Electronically Signed   By: Jonna Clark M.D.   On: 12/14/2019 17:26   DG Pelvis Comp Min 3V  Result Date: 12/14/2019 CLINICAL DATA:  Acetabular fractures. EXAM: JUDET PELVIS - 3+ VIEW COMPARISON:  CT scan, same date. FINDINGS:  Complex comminuted left acetabular fractures are again demonstrated. The humeral head is normally located. Intramedullary gamma nail and dynamic hip screw are in place along with a proximal cerclage wire. The right hip demonstrates severe degenerative changes and mild protrusio. IMPRESSION: Complex comminuted left acetabular fractures. Electronically Signed   By: Rudie Meyer M.D.   On: 12/14/2019 19:41   DG Hand Complete Left  Result Date: 12/14/2019 CLINICAL DATA:  Left hand and wrist pain.  Nonverbal. EXAM: LEFT HAND - COMPLETE 3+ VIEW COMPARISON:  Left wrist radiographs obtained at the same time. FINDINGS: Diffuse swelling of the hand. Mild carpal degenerative changes. Moderate distal radioulnar joint degenerative spur formation. No fracture, dislocation, bone destruction, periosteal reaction or soft tissue gas. IMPRESSION: 1. Diffuse swelling of the hand without fracture. 2. Wrist degenerative changes. Electronically Signed   By: Beckie Salts M.D.   On: 12/14/2019 12:31   EKG: Independently reviewed.  Vent. rate 97 BPM PR interval * ms QRS  duration 83 ms QT/QTc 391/497 ms P-R-T axes 62 60 * Sinus rhythm Atrial premature complex Inferior infarct, old  Assessment/Plan Principal Problem:   Closed left acetabular fracture (HCC) Observation/telemetry. Keep n.p.o. Buck's traction per protocol. Supplemental oxygen as needed. Analgesics and antiemetics as needed. Consult dietitian and TOC. PT/OT consult per orthopedic surgery. Orthopedic surgery will evaluate.  Active Problems:   SIRS (systemic inflammatory response syndrome) (HCC) In the setting of trauma and leukocytosis.    Cellulitis of left hand Edema from trauma? Continue IV antibiotics for now. Check procalcitonin. Follow-up blood culture and sensitivity.    Benign essential HTN Monitor blood pressure. Antihypertensive as needed.    Dementia with behavioral disturbance (HCC) Off Depakote ER and Lexapro. Reorientation  as needed.    Seizures (HCC) Off Vimpat. Resume Vimpat 100 mg p.o. twice daily.     DVT prophylaxis: Lovenox SQ. Code Status:   Full code. Family Communication:  I spoke Disposition Plan:   Patient is from:  Home.   Anticipated DC to:  TBD.  Anticipated DC date:  12/17/2019  Anticipated DC barriers:   Consults called:  Orthopedic surgery (Dr. Carola Frost). Admission status:  Observation/  Severity of Illness:  Bobette Mo MD [facility]  How to contact the [facility] Attending or Consulting provider 7A - 7P or covering provider during after hours 7P -7A, for this patient?   1. Check the care team in [facility] and look for a) attending/consulting [facility] provider listed and b) the [facility] team listed 2. Log into [email or web address] and use [place]'s universal password to access. If you do not have the password, please contact the hospital operator. 3. Locate the [facility] provider you are looking for under [facility] and page to a number that you can be directly reached. 4. If you still have difficulty reaching the provider, please page the [facility] (Director on Call) for the Hospitalists listed on amion for assistance.  12/14/2019, 7:46 PM   This document was prepared using Dragon voice recognition software and may contain some unintended transcription errors.

## 2019-12-14 NOTE — Progress Notes (Signed)
Following for code sepsis 

## 2019-12-14 NOTE — Progress Notes (Addendum)
Pharmacy Antibiotic Note  Adolf Ormiston. is a 85 y.o. male admitted on 12/14/2019 with cellulitis.  Pharmacy has been consulted for vancomycin & cefepime dosing. Vanc 1 gm given at 1557, cefepime 2 gm given at 1459. WBC 14.1. SCr 1.21. Sed rate 108, CRP 11.2. LA 2.0, AF L hand swollen & warm. No abscess on on bedside U/S.  Pt states his height is  5'6" and weight is "90 something" pounds. States he has lost a significant amount of weight ? If nurse tech was referring to another patient.   Plan: Vancomycin 1 gm loading dose given in ED at 1557 Vancomycin 750 IV every 24 hours.  Goal trough 10-15 mcg/mL.  Cefepime 2 gm IV q12 F/u renal function, WBC, temp, culture data Vancomycin levels as needed   Temp (24hrs), Avg:98.7 F (37.1 C), Min:98.4 F (36.9 C), Max:98.9 F (37.2 C)  Recent Labs  Lab 12/14/19 1115 12/14/19 1500  WBC 14.1*  --   CREATININE 1.21  --   LATICACIDVEN 2.1* 2.0*    CrCl cannot be calculated (Unknown ideal weight.).    Allergies  Allergen Reactions  . Penicillins Hives and Swelling    Patient couldn't answer any questions    Antimicrobials this admission: 11/16 vanc>> 11/16 cefepime>> Dose adjustments this admission:  Microbiology results: 11/16 covid/flu: 11/16 UCx:  11/16 BCx2:   Thank you for allowing pharmacy to be a part of this patient's care.  Herby Abraham, Pharm.D 12/14/2019 8:55 PM

## 2019-12-14 NOTE — Progress Notes (Signed)
Pharmacy Antibiotic Note  Johnathan Mitchell. is a 85 y.o. male admitted on 12/14/2019 with cellulitis.  Pharmacy has been consulted for vancomycin & cefepime dosing. Vanc 1 gm given at 1557, cefepime 2 gm given at 1459. WBC 14.1. SCr 1.21. Sed rate 108, CRP 11.2. LA 2.0, AF L hand swollen & warm. No abscess on on bedside U/S.  Pt states his height is  5'6" and weight is "90 something" pounds. States he has lost a significant amount of weight & left arm & leg were amputated.   Plan: Vancomycin 1 gm loading dose given in ED at 1557 Vancomycin 750 IV every 24 hours.  Goal trough 10-15 mcg/mL.  Cefepime 2 gm IV q12 F/u renal function, WBC, temp, culture data Vancomycin levels as needed   Temp (24hrs), Avg:98.7 F (37.1 C), Min:98.4 F (36.9 C), Max:98.9 F (37.2 C)  Recent Labs  Lab 12/14/19 1115 12/14/19 1500  WBC 14.1*  --   CREATININE 1.21  --   LATICACIDVEN 2.1* 2.0*    CrCl cannot be calculated (Unknown ideal weight.).    Allergies  Allergen Reactions   Penicillins Hives and Swelling    Patient couldn't answer any questions    Antimicrobials this admission: 11/16 vanc>> 11/16 cefepime>> Dose adjustments this admission:  Microbiology results: 11/16 covid/flu: 11/16 UCx:  11/16 BCx2:   Thank you for allowing pharmacy to be a part of this patients care.  Herby Abraham, Pharm.D 12/14/2019 7:24 PM

## 2019-12-14 NOTE — Progress Notes (Signed)
Notified bedside nurse of need to draw repeat lactic acid. 

## 2019-12-14 NOTE — Progress Notes (Signed)
Notified bedside nurse of need to  draw blood cultures and give antibiotics.  

## 2019-12-14 NOTE — Progress Notes (Signed)
Notified bedside nurse of need to administer antibiotics.  

## 2019-12-14 NOTE — ED Triage Notes (Signed)
Pt sent from home via EMS with c/o weakness and pain with movement. Per daughter pt is usually able to help transfer but lately it just hurts him to move. EMS states residence had a strong urine smell

## 2019-12-14 NOTE — ED Provider Notes (Signed)
Care of the patient assumed at the change of shift. Patient with wrist swelling and pain, could be infectious vs inflammatory. Sent for CT due to concerns of abdominal pain but this demonstrated an acetabular fracture. Spoke with daughter who states the patient falls often but has indicated increased pain recently not allowing family to lie him flat or on his left side. Will discuss with Ortho, plan admission to Hospitalist.   6:28 PM Spoke with Dr. Robb Matar, Hospitalist, who will begin admission process. Awaiting call back from Ortho.   6:40 PM Spoke with Dr. Carola Frost, Ortho, who requests plain films of the pelvis be done to help with possible operative planning. If displaced on plain film, will need to go to [facility].    Pollyann Savoy, MD 12/14/19 1958

## 2019-12-14 NOTE — ED Notes (Signed)
While pt was in CT, informed that IV would not flush, brought back to room. IV attempts unsuccessful, will consult IV team.

## 2019-12-14 NOTE — Progress Notes (Signed)
A consult was received from an ED physician for vancomycin and cefepime per pharmacy dosing (for an indication other than meningitis). The patient's profile has been reviewed for ht/wt/allergies/indication/available labs. A one time order has been placed for the above antibiotics.  Further antibiotics/pharmacy consults should be ordered by admitting physician if indicated.                       Bernadene Person, PharmD, BCPS [phone] 12/14/2019, 12:36 PM

## 2019-12-14 NOTE — ED Provider Notes (Signed)
[facility]-EMERGENCY DEPT Provider Note   CSN: [id number] Arrival date & time: 12/14/19  1028  LEVEL 5 CAVEAT - DEMENTIA  History Chief Complaint  Patient presents with  . Weakness    Johnathan Mitchell. is a 85 y.o. male.  HPI 85 year old male presents with altered mental status and pain.  History is primarily from daughter who takes care of him.  The patient has a history of dementia and left-sided weakness from prior strokes.  The patient has been having atraumatic left hand swelling and pain for several weeks.  Went to the [place] where daughter states he had a negative x-ray and was put in a brace.  The hand has been warm.  No fevers.  Patient also seems to be having groin pain but that seem to be getting better after topical treatment.  However he seems to be having some abdominal/back pain recently.  Patient seems to be a little more confused than his chronic dementia as she noticed this morning he was asking EMS to put his hat on despite him already having a hat on.  Otherwise he has not taking his meds last couple days.  He did not eat much yesterday.   Past Medical History:  Diagnosis Date  . Asthma   . Dementia (HCC)   . Hypertension   . Seizures (HCC)   . Stroke [facility])     Patient Active Problem List   Diagnosis Date Noted  . Protein-calorie malnutrition (HCC) 05/20/2017  . TIA (transient ischemic attack) 05/20/2017  . 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 Seizures (HCC) 05/19/2017  . AKI (acute kidney injury) (HCC) 05/12/2017  . Stroke (HCC) 05/11/2017  . Femur fracture (HCC) 05/11/2017  . Chest pain 02/09/2016  . HTN (hypertension) 02/09/2016  . Dementia with behavioral disturbance (HCC) 02/09/2016  . Chest pain at rest 02/09/2016  . Cerebral infarction (HCC) 02/01/2014  . Benign essential HTN 02/01/2014    Past Surgical History:  Procedure Laterality Date  . BACK SURGERY    . FEMUR IM NAIL Left 05/13/2017   Procedure: INTRAMEDULLARY  (IM) NAIL FEMORAL;  Surgeon: [name], MD;  Location: [facility] OR;  Service: Orthopedics;  Laterality: Left;  . KNEE SURGERY         Family History  Problem Relation Age of Onset  . Hyperlipidemia Mother   . Hypertension Mother     Social History   Tobacco Use  . Smoking status: Former Games developer  . Smokeless tobacco: Never Used  Substance Use Topics  . Alcohol use: No  . Drug use: No    Home Medications Prior to Admission medications   Medication Sig Start Date End Date Taking? Authorizing Provider  acetaminophen (TYLENOL) 325 MG tablet Take 2 tablets (650 mg total) by mouth every 6 (six) hours as needed for mild pain or moderate pain (or temp > 37.5 C (99.5 F)). Patient taking differently: Take 650 mg by mouth every 6 (six) hours as needed for mild pain or moderate pain.  05/16/17  Yes [name], PA-C  atorvastatin (LIPITOR) 80 MG tablet Take 1 tablet (80 mg total) by mouth daily at 6 PM. 08/20/17  Yes Medina-Vargas, Monina C, NP  docusate sodium (COLACE) 100 MG capsule Take 1 capsule (100 mg total) by mouth 2 (two) times daily. Patient taking differently: Take 100 mg by mouth 2 (two) times daily as needed for mild constipation.  05/16/17  Yes Hongalgi, Maximino Greenland, MD  OVER THE COUNTER MEDICATION Apply 1 application  topically daily as needed (pain). Moov pain relieve cream   Yes [provider]  PRESCRIPTION MEDICATION Apply 1 application topically in the morning and at bedtime. Moisturizing mineral cream   Yes [provider]  aspirin 325 MG tablet Take 1 tablet (325 mg total) by mouth daily. Patient not taking: Reported on 12/14/2019 05/17/17   Elease EtienneHongalgi, Anand D, MD  calcium citrate (CALCITRATE - DOSED IN MG ELEMENTAL CALCIUM) 950 MG tablet Take 1 tablet (200 mg of elemental calcium total) by mouth 2 (two) times daily. Patient not taking: Reported on 11/11/2018 08/20/17   Medina-Vargas, Avanell ShackletonMonina C, NP  Cholecalciferol 2000 units TABS Take 1 tablet (2,000 Units total) by  mouth daily. Patient not taking: Reported on 11/11/2018 08/20/17   Medina-Vargas, Monina C, NP  divalproex (DEPAKOTE ER) 500 MG 24 hr tablet Take 1 tablet (500 mg total) by mouth at bedtime. Patient not taking: Reported on 07/11/2019 08/20/17   Medina-Vargas, Monina C, NP  escitalopram (LEXAPRO) 10 MG tablet Take 1 tablet (10 mg total) by mouth daily. Patient not taking: Reported on 11/11/2018 08/20/17   Medina-Vargas, Monina C, NP  Lacosamide (VIMPAT) 100 MG TABS Take 1 tablet (100 mg total) by mouth in the morning and at bedtime. Patient not taking: Reported on 12/14/2019 07/11/19   Michela PitcherFawze, Mina A, PA-C  meloxicam (MOBIC) 7.5 MG tablet Take 1 tablet (7.5 mg total) by mouth every 12 (twelve) hours. Patient not taking: Reported on 11/11/2018 08/20/17   Medina-Vargas, Monina C, NP  mupirocin cream (BACTROBAN) 2 % Apply 1 application topically 2 (two) times daily. 09/28/19   Gerhard MunchLockwood, Robert, MD  vitamin C (VITAMIN C) 500 MG tablet Take 1 tablet (500 mg total) by mouth daily. Patient not taking: Reported on 11/11/2018 05/16/17   Montez MoritaPaul, Keith, PA-C    Allergies    Penicillins  Review of Systems   Review of Systems  Unable to perform ROS: Dementia    Physical Exam Updated Vital Signs BP (!) 141/91 (BP Location: Right Arm)   Pulse (!) 105   Temp 98.9 F (37.2 C) (Rectal)   Resp 16   SpO2 92%   Physical Exam Vitals and nursing note reviewed.  Constitutional:      Appearance: He is well-developed.     Comments: Left sided contractures  HENT:     Head: Normocephalic and atraumatic.     Right Ear: External ear normal.     Left Ear: External ear normal.     Nose: Nose normal.  Eyes:     General:        Right eye: No discharge.        Left eye: No discharge.     Pupils: Pupils are equal, round, and reactive to light.  Cardiovascular:     Rate and Rhythm: Normal rate and regular rhythm.     Pulses:          Radial pulses are 2+ on the left side.     Heart sounds: Normal heart sounds.   Pulmonary:     Effort: Pulmonary effort is normal.     Breath sounds: Normal breath sounds.  Abdominal:     Palpations: Abdomen is soft.     Tenderness: There is abdominal tenderness.     Comments: Hard to localize, but he guards when palpated  Genitourinary:    Comments: Patient cries out in pain when trying to move his legs to examine his groin Musculoskeletal:     Cervical back: Neck supple.  Comments: Left hand is diffusely swollen, especially dorsally. No erythema but is warm. Has pain with ROM of wrist. Pain with any slight movement of his fingers  Skin:    General: Skin is warm and dry.  Neurological:     Mental Status: He is alert. He is disoriented.  Psychiatric:        Mood and Affect: Mood is not anxious.     ED Results / Procedures / Treatments   Labs (all labs ordered are listed, but only abnormal results are displayed) Labs Reviewed  COMPREHENSIVE METABOLIC PANEL - Abnormal; Notable for the following components:      Result Value   Glucose, Bld 110 (*)    Total Protein 8.9 (*)    GFR, Estimated 59 (*)    All other components within normal limits  CBC WITH DIFFERENTIAL/PLATELET - Abnormal; Notable for the following components:   WBC 14.1 (*)    RDW 15.8 (*)    Neutro Abs 12.6 (*)    Lymphs Abs 0.5 (*)    Abs Immature Granulocytes 0.08 (*)    All other components within normal limits  SEDIMENTATION RATE - Abnormal; Notable for the following components:   Sed Rate 108 (*)    All other components within normal limits  C-REACTIVE PROTEIN - Abnormal; Notable for the following components:   CRP 11.2 (*)    All other components within normal limits  LACTIC ACID, PLASMA - Abnormal; Notable for the following components:   Lactic Acid, Venous 2.1 (*)    All other components within normal limits  LACTIC ACID, PLASMA - Abnormal; Notable for the following components:   Lactic Acid, Venous 2.0 (*)    All other components within normal limits  URINE CULTURE   CULTURE, BLOOD (SINGLE)  CULTURE, BLOOD (ROUTINE X 2)  CULTURE, BLOOD (ROUTINE X 2)  LIPASE, BLOOD  URINALYSIS, ROUTINE W REFLEX MICROSCOPIC  CBG MONITORING, ED    EKG EKG Interpretation  Date/Time:  Tuesday December 14 2019 11:28:56 EST Ventricular Rate:  97 PR Interval:    QRS Duration: 83 QT Interval:  391 QTC Calculation: 497 R Axis:   60 Text Interpretation: Sinus rhythm Atrial premature complex Inferior infarct, old Confirmed by Pricilla Loveless [phone]) on 12/14/2019 11:33:14 AM   Radiology DG Chest 1 View  Result Date: 12/14/2019 CLINICAL DATA:  Altered mental status. EXAM: CHEST  1 VIEW COMPARISON:  June 13 21. FINDINGS: Similar cardiac silhouette. No confluent consolidation. No visible pleural effusions or pneumothorax. Advanced bilateral shoulder degenerative change. Multilevel degenerative change of the thoracic spine, suboptimally evaluated without a lateral radiograph. IMPRESSION: No evidence of acute cardiopulmonary disease. Electronically Signed   By: Feliberto Harts MD   On: 12/14/2019 12:28   DG Wrist Complete Left  Result Date: 12/14/2019 CLINICAL DATA:  Altered mental status, pain EXAM: LEFT WRIST - COMPLETE 3+ VIEW COMPARISON:  None. FINDINGS: No displaced fracture or dislocation of the left wrist. Assessment of the carpus is limited by contracted, flexed position of the hand. Within this limitation, there is severe radiocarpal and carpal arthrosis with subchondral lucencies. The carpus is normally aligned. Diffuse soft tissue edema about the hand and wrist. IMPRESSION: 1.  No displaced fracture or dislocation of the left wrist 2. Assessment of the carpus is limited by contracted, flexed position of the hand. Within this limitation, there is severe radiocarpal and carpal arthrosis with subchondral lucencies suggestive of erosive arthropathy such as gout. 3.  Diffuse soft tissue edema about the hand and  wrist. Electronically Signed   By: Lauralyn Primes M.D.   On:  12/14/2019 12:37   CT Head Wo Contrast  Result Date: 12/14/2019 CLINICAL DATA:  Delirium. Additional history provided: Weakness, pain with movement. EXAM: CT HEAD WITHOUT CONTRAST TECHNIQUE: Contiguous axial images were obtained from the base of the skull through the vertex without intravenous contrast. COMPARISON:  Prior head CT 08/10/2019.  Brain MRI 02/01/2014. FINDINGS: Brain: Moderate generalized cerebral atrophy. Redemonstrated chronic infarct within the left corona radiata/basal ganglia. Additional redemonstrated small chronic infarcts within the deep gray nuclei. Background advanced ill-defined hypoattenuation within the cerebral white matter is nonspecific, but compatible with chronic small vessel ischemic disease. There is no acute intracranial hemorrhage. No demarcated cortical infarct. No extra-axial fluid collection. No evidence of intracranial mass. No midline shift. Vascular: No hyperdense vessel.  Atherosclerotic calcifications. Skull: Normal. Negative for fracture or focal lesion. Sinuses/Orbits: Visualized orbits show no acute finding. Chronic medially displaced deformity of the left lamina papyracea. No significant paranasal sinus disease at the imaged levels. IMPRESSION: No evidence of acute intracranial abnormality. Redemonstrated severe chronic small vessel ischemic disease with multiple chronic small-vessel infarcts. Stable, moderate cerebral atrophy. Electronically Signed   By: Jackey Loge DO   On: 12/14/2019 14:47   DG Hand Complete Left  Result Date: 12/14/2019 CLINICAL DATA:  Left hand and wrist pain.  Nonverbal. EXAM: LEFT HAND - COMPLETE 3+ VIEW COMPARISON:  Left wrist radiographs obtained at the same time. FINDINGS: Diffuse swelling of the hand. Mild carpal degenerative changes. Moderate distal radioulnar joint degenerative spur formation. No fracture, dislocation, bone destruction, periosteal reaction or soft tissue gas. IMPRESSION: 1. Diffuse swelling of the hand without  fracture. 2. Wrist degenerative changes. Electronically Signed   By: Beckie Salts M.D.   On: 12/14/2019 12:31    Procedures Ultrasound ED Soft Tissue  Date/Time: 12/14/2019 1:44 PM Performed by: Pricilla Loveless, MD Authorized by: Pricilla Loveless, MD   Procedure details:    Indications: evaluate for cellulitis     Transverse view:  Visualized   Longitudinal view:  Visualized   Images: archived     Limitations:  Positioning Location:    Location: upper extremity     Side:  Left Findings:     no abscess present    cellulitis present  .Critical Care Performed by: Pricilla Loveless, MD Authorized by: Pricilla Loveless, MD   Critical care provider statement:    Critical care time (minutes):  35   Critical care time was exclusive of:  Separately billable procedures and treating other patients   Critical care was necessary to treat or prevent imminent or life-threatening deterioration of the following conditions:  Sepsis   Critical care was time spent personally by me on the following activities:  Discussions with consultants, evaluation of patient's response to treatment, examination of patient, ordering and performing treatments and interventions, ordering and review of laboratory studies, ordering and review of radiographic studies, pulse oximetry, re-evaluation of patient's condition, obtaining history from patient or surrogate and review of old charts   (including critical care time)  Medications Ordered in ED Medications  vancomycin (VANCOCIN) IVPB 1000 mg/200 mL premix (1,000 mg Intravenous New Bag/Given 12/14/19 1557)  sodium chloride (PF) 0.9 % injection (has no administration in time range)  fentaNYL (SUBLIMAZE) injection 50 mcg (50 mcg Intravenous Given 12/14/19 1122)  lactated ringers bolus 1,000 mL (1,000 mLs Intravenous New Bag/Given 12/14/19 1121)  ceFEPIme (MAXIPIME) 2 g in sodium chloride 0.9 % 100 mL IVPB (0 g Intravenous Stopped  12/14/19 1530)  iohexol (OMNIPAQUE) 300  MG/ML solution 100 mL (100 mLs Intravenous Contrast Given 12/14/19 1609)  morphine 4 MG/ML injection 4 mg (4 mg Intravenous Given 12/14/19 1453)    ED Course  I have reviewed the triage vital signs and the nursing notes.  Pertinent labs & imaging results that were available during my care of the patient were reviewed by me and considered in my medical decision making (see chart for details).    MDM Rules/Calculators/A&P                          Patient is not febrile but with elevated WBC and elevated lactate another inflammatory markers I am worried about infection.  His left hand is swollen and warm.  Consider gout versus infectious cause.  There is no abscess on bedside ultrasound.  I think you will need MRI to further evaluate what is going on with his hand and possibly arthrocentesis by Ortho.  Otherwise, given the abdominal complaints he will need abdominal CT prior to admission.  Care transferred to Dr. Bernette Mayers.  Given broad IV antibiotics. Final Clinical Impression(s) / ED Diagnoses Final diagnoses:  None    Rx / DC Orders ED Discharge Orders    None       Pricilla Loveless, MD 12/14/19 1622

## 2019-12-14 NOTE — ED Notes (Signed)
Dr Bernette Mayers requested pt to remain in ED until x-rays can be viewed by Dr Carola Frost after he completes his surgical case.    Nurse Jon Gills, who was given report on the floor is aware.

## 2019-12-14 NOTE — ED Notes (Signed)
Dr Bernette Mayers requested pt to remain in ED until x-rays can be viewed by Dr Carola Frost after he completes his surgical case. Nurse aware of request.

## 2019-12-14 NOTE — ED Notes (Signed)
Report given to Cameron RN °

## 2019-12-14 NOTE — ED Notes (Signed)
Pt back to CT via stretcher 

## 2019-12-14 NOTE — ED Notes (Signed)
IV start attempted x1 by this RN, unable to advance angiocatheter.  1 set of blood cultures collected.

## 2019-12-15 DIAGNOSIS — E440 Moderate protein-calorie malnutrition: Secondary | ICD-10-CM | POA: Diagnosis not present

## 2019-12-15 DIAGNOSIS — S32485A Nondisplaced dome fracture of left acetabulum, initial encounter for closed fracture: Secondary | ICD-10-CM | POA: Diagnosis not present

## 2019-12-15 DIAGNOSIS — R269 Unspecified abnormalities of gait and mobility: Secondary | ICD-10-CM | POA: Diagnosis not present

## 2019-12-15 DIAGNOSIS — R6510 Systemic inflammatory response syndrome (SIRS) of non-infectious origin without acute organ dysfunction: Secondary | ICD-10-CM | POA: Diagnosis not present

## 2019-12-15 DIAGNOSIS — F0151 Vascular dementia with behavioral disturbance: Secondary | ICD-10-CM

## 2019-12-15 DIAGNOSIS — Z8249 Family history of ischemic heart disease and other diseases of the circulatory system: Secondary | ICD-10-CM | POA: Diagnosis not present

## 2019-12-15 DIAGNOSIS — S32409A Unspecified fracture of unspecified acetabulum, initial encounter for closed fracture: Secondary | ICD-10-CM | POA: Diagnosis not present

## 2019-12-15 DIAGNOSIS — I69354 Hemiplegia and hemiparesis following cerebral infarction affecting left non-dominant side: Secondary | ICD-10-CM | POA: Diagnosis not present

## 2019-12-15 DIAGNOSIS — M2120 Flexion deformity, unspecified site: Secondary | ICD-10-CM | POA: Diagnosis present

## 2019-12-15 DIAGNOSIS — S32402A Unspecified fracture of left acetabulum, initial encounter for closed fracture: Secondary | ICD-10-CM | POA: Diagnosis present

## 2019-12-15 DIAGNOSIS — M7989 Other specified soft tissue disorders: Secondary | ICD-10-CM | POA: Diagnosis present

## 2019-12-15 DIAGNOSIS — I69359 Hemiplegia and hemiparesis following cerebral infarction affecting unspecified side: Secondary | ICD-10-CM | POA: Diagnosis not present

## 2019-12-15 DIAGNOSIS — R569 Unspecified convulsions: Secondary | ICD-10-CM | POA: Diagnosis not present

## 2019-12-15 DIAGNOSIS — Z880 Allergy status to penicillin: Secondary | ICD-10-CM | POA: Diagnosis not present

## 2019-12-15 DIAGNOSIS — M109 Gout, unspecified: Secondary | ICD-10-CM | POA: Diagnosis present

## 2019-12-15 DIAGNOSIS — J45909 Unspecified asthma, uncomplicated: Secondary | ICD-10-CM | POA: Diagnosis present

## 2019-12-15 DIAGNOSIS — F329 Major depressive disorder, single episode, unspecified: Secondary | ICD-10-CM | POA: Diagnosis not present

## 2019-12-15 DIAGNOSIS — R531 Weakness: Secondary | ICD-10-CM | POA: Diagnosis not present

## 2019-12-15 DIAGNOSIS — Z87891 Personal history of nicotine dependence: Secondary | ICD-10-CM | POA: Diagnosis not present

## 2019-12-15 DIAGNOSIS — R7982 Elevated C-reactive protein (CRP): Secondary | ICD-10-CM | POA: Diagnosis present

## 2019-12-15 DIAGNOSIS — I639 Cerebral infarction, unspecified: Secondary | ICD-10-CM | POA: Diagnosis not present

## 2019-12-15 DIAGNOSIS — F0391 Unspecified dementia with behavioral disturbance: Secondary | ICD-10-CM | POA: Diagnosis present

## 2019-12-15 DIAGNOSIS — G40909 Epilepsy, unspecified, not intractable, without status epilepticus: Secondary | ICD-10-CM | POA: Diagnosis present

## 2019-12-15 DIAGNOSIS — Z8673 Personal history of transient ischemic attack (TIA), and cerebral infarction without residual deficits: Secondary | ICD-10-CM | POA: Diagnosis not present

## 2019-12-15 DIAGNOSIS — M25539 Pain in unspecified wrist: Secondary | ICD-10-CM | POA: Diagnosis present

## 2019-12-15 DIAGNOSIS — Z20822 Contact with and (suspected) exposure to covid-19: Secondary | ICD-10-CM | POA: Diagnosis present

## 2019-12-15 DIAGNOSIS — I10 Essential (primary) hypertension: Secondary | ICD-10-CM | POA: Diagnosis not present

## 2019-12-15 DIAGNOSIS — M25532 Pain in left wrist: Secondary | ICD-10-CM | POA: Diagnosis present

## 2019-12-15 DIAGNOSIS — E46 Unspecified protein-calorie malnutrition: Secondary | ICD-10-CM | POA: Diagnosis not present

## 2019-12-15 DIAGNOSIS — Z79899 Other long term (current) drug therapy: Secondary | ICD-10-CM | POA: Diagnosis not present

## 2019-12-15 DIAGNOSIS — F0150 Vascular dementia without behavioral disturbance: Secondary | ICD-10-CM | POA: Diagnosis not present

## 2019-12-15 DIAGNOSIS — Z6822 Body mass index (BMI) 22.0-22.9, adult: Secondary | ICD-10-CM | POA: Diagnosis not present

## 2019-12-15 DIAGNOSIS — R700 Elevated erythrocyte sedimentation rate: Secondary | ICD-10-CM | POA: Diagnosis present

## 2019-12-15 DIAGNOSIS — Z87898 Personal history of other specified conditions: Secondary | ICD-10-CM | POA: Diagnosis not present

## 2019-12-15 DIAGNOSIS — Z83438 Family history of other disorder of lipoprotein metabolism and other lipidemia: Secondary | ICD-10-CM | POA: Diagnosis not present

## 2019-12-15 DIAGNOSIS — R54 Age-related physical debility: Secondary | ICD-10-CM | POA: Diagnosis present

## 2019-12-15 DIAGNOSIS — L03114 Cellulitis of left upper limb: Secondary | ICD-10-CM | POA: Diagnosis not present

## 2019-12-15 DIAGNOSIS — S32402D Unspecified fracture of left acetabulum, subsequent encounter for fracture with routine healing: Secondary | ICD-10-CM | POA: Diagnosis not present

## 2019-12-15 DIAGNOSIS — F39 Unspecified mood [affective] disorder: Secondary | ICD-10-CM | POA: Diagnosis present

## 2019-12-15 DIAGNOSIS — R5381 Other malaise: Secondary | ICD-10-CM | POA: Diagnosis not present

## 2019-12-15 DIAGNOSIS — Z7982 Long term (current) use of aspirin: Secondary | ICD-10-CM | POA: Diagnosis not present

## 2019-12-15 LAB — CBC WITH DIFFERENTIAL/PLATELET
Abs Immature Granulocytes: 0.05 10*3/uL (ref 0.00–0.07)
Basophils Absolute: 0 10*3/uL (ref 0.0–0.1)
Basophils Relative: 0 %
Eosinophils Absolute: 0.2 10*3/uL (ref 0.0–0.5)
Eosinophils Relative: 2 %
HCT: 34.8 % — ABNORMAL LOW (ref 39.0–52.0)
Hemoglobin: 10.7 g/dL — ABNORMAL LOW (ref 13.0–17.0)
Immature Granulocytes: 1 %
Lymphocytes Relative: 11 %
Lymphs Abs: 1.1 10*3/uL (ref 0.7–4.0)
MCH: 26.1 pg (ref 26.0–34.0)
MCHC: 30.7 g/dL (ref 30.0–36.0)
MCV: 84.9 fL (ref 80.0–100.0)
Monocytes Absolute: 0.7 10*3/uL (ref 0.1–1.0)
Monocytes Relative: 7 %
Neutro Abs: 7.9 10*3/uL — ABNORMAL HIGH (ref 1.7–7.7)
Neutrophils Relative %: 79 %
Platelets: 173 10*3/uL (ref 150–400)
RBC: 4.1 MIL/uL — ABNORMAL LOW (ref 4.22–5.81)
RDW: 15.9 % — ABNORMAL HIGH (ref 11.5–15.5)
WBC: 9.9 10*3/uL (ref 4.0–10.5)
nRBC: 0 % (ref 0.0–0.2)

## 2019-12-15 LAB — COMPREHENSIVE METABOLIC PANEL
ALT: 16 U/L (ref 0–44)
AST: 26 U/L (ref 15–41)
Albumin: 2.4 g/dL — ABNORMAL LOW (ref 3.5–5.0)
Alkaline Phosphatase: 57 U/L (ref 38–126)
Anion gap: 8 (ref 5–15)
BUN: 22 mg/dL (ref 8–23)
CO2: 24 mmol/L (ref 22–32)
Calcium: 8.6 mg/dL — ABNORMAL LOW (ref 8.9–10.3)
Chloride: 103 mmol/L (ref 98–111)
Creatinine, Ser: 1.14 mg/dL (ref 0.61–1.24)
GFR, Estimated: >60 mL/min (ref >60)
Glucose, Bld: 93 mg/dL (ref 70–99)
Potassium: 4.4 mmol/L (ref 3.5–5.1)
Sodium: 135 mmol/L (ref 135–145)
Total Bilirubin: 1.2 mg/dL (ref 0.3–1.2)
Total Protein: 6.5 g/dL (ref 6.5–8.1)

## 2019-12-15 LAB — PROCALCITONIN: Procalcitonin: 0.35 ng/mL

## 2019-12-15 MED ORDER — PROSOURCE PLUS PO LIQD
30.0000 mL | Freq: Every day | ORAL | Status: DC
  Administered 2019-12-16 – 2019-12-20 (×5): 30 mL via ORAL
  Filled 2019-12-15 (×5): qty 30

## 2019-12-15 MED ORDER — PREDNISONE 20 MG PO TABS
40.0000 mg | ORAL_TABLET | Freq: Every day | ORAL | Status: DC
  Administered 2019-12-15 – 2019-12-17 (×2): 40 mg via ORAL
  Filled 2019-12-15 (×3): qty 2

## 2019-12-15 MED ORDER — ENOXAPARIN SODIUM 40 MG/0.4ML ~~LOC~~ SOLN
40.0000 mg | SUBCUTANEOUS | Status: DC
  Administered 2019-12-16 – 2019-12-18 (×3): 40 mg via SUBCUTANEOUS
  Filled 2019-12-15 (×4): qty 0.4

## 2019-12-15 NOTE — Consult Note (Signed)
Reason for Consult:Left acet fx Referring Physician: Deward Sebek. is an 85 y.o. male.  HPI: Johnathan Mitchell was admitted yesterday with a couple week hx/o hand swelling/pain. He also c/o some groin pain so a pelvic x-ray was done. This showed a left acetabular fx and orthopedic surgery was consulted. There's no hx/o fall. He is quite demented and cannot contribute to history. He denies pelvic pain to me but does c/o left thigh pain.  Past Medical History:  Diagnosis Date  . Asthma   . Dementia (HCC)   . Hypertension   . Seizures (HCC)   . Stroke [facility])     Past Surgical History:  Procedure Laterality Date  . BACK SURGERY    . FEMUR IM NAIL Left 05/13/2017   Procedure: INTRAMEDULLARY (IM) NAIL FEMORAL;  Surgeon: [name], MD;  Location: [facility] OR;  Service: Orthopedics;  Laterality: Left;  . KNEE SURGERY      Family History  Problem Relation Age of Onset  . Hyperlipidemia Mother   . Hypertension Mother     Social History:  reports that he has quit smoking. He has never used smokeless tobacco. He reports that he does not drink alcohol and does not use drugs.  Allergies:  Allergies  Allergen Reactions  . Penicillins Hives and Swelling    Patient couldn't answer any questions    Medications: I have reviewed the patient's current medications.  Results for orders placed or performed during the hospital encounter of 12/14/19 (from the past 48 hour(s))  Comprehensive metabolic panel     Status: Abnormal   Collection Time: 12/14/19 11:15 AM  Result Value Ref Range   Sodium 138 135 - 145 mmol/L   Potassium 4.0 3.5 - 5.1 mmol/L   Chloride 98 98 - 111 mmol/L   CO2 27 22 - 32 mmol/L   Glucose, Bld 110 (H) 70 - 99 mg/dL    Comment: Glucose reference range applies only to samples taken after fasting for at least 8 hours.   BUN 23 8 - 23 mg/dL   Creatinine, Ser 2.97 0.61 - 1.24 mg/dL   Calcium 98.9 8.9 - 21.1 mg/dL   Total Protein 8.9 (H) 6.5 - 8.1 g/dL   Albumin 3.5 3.5 -  5.0 g/dL   AST 25 15 - 41 U/L   ALT 24 0 - 44 U/L   Alkaline Phosphatase 76 38 - 126 U/L   Total Bilirubin 0.8 0.3 - 1.2 mg/dL   GFR, Estimated 59 (L) >60 mL/min    Comment: (NOTE) Calculated using the CKD-EPI Creatinine Equation (2021)    Anion gap 13 5 - 15    Comment: Performed at [facility], [address], [place], [place] [address]  CBC with Differential     Status: Abnormal   Collection Time: 12/14/19 11:15 AM  Result Value Ref Range   WBC 14.1 (H) 4.0 - 10.5 K/uL   RBC 4.98 4.22 - 5.81 MIL/uL   Hemoglobin 13.0 13.0 - 17.0 g/dL   HCT 08.1 39 - 52 %   MCV 81.7 80.0 - 100.0 fL   MCH 26.1 26.0 - 34.0 pg   MCHC 31.9 30.0 - 36.0 g/dL   RDW 44.8 (H) 18.5 - 63.1 %   Platelets 238 150 - 400 K/uL   nRBC 0.0 0.0 - 0.2 %   Neutrophils Relative % 89 %   Neutro Abs 12.6 (H) 1.7 - 7.7 K/uL   Lymphocytes Relative 4 %   Lymphs  Abs 0.5 (L) 0.7 - 4.0 K/uL   Monocytes Relative 6 %   Monocytes Absolute 0.9 0.1 - 1.0 K/uL   Eosinophils Relative 0 %   Eosinophils Absolute 0.0 0.0 - 0.5 K/uL   Basophils Relative 0 %   Basophils Absolute 0.0 0.0 - 0.1 K/uL   Immature Granulocytes 1 %   Abs Immature Granulocytes 0.08 (H) 0.00 - 0.07 K/uL    Comment: Performed at [facility], [address] [place], [place] [address]  Lipase, blood     Status: None   Collection Time: 12/14/19 11:15 AM  Result Value Ref Range   Lipase 27 11 - 51 U/L    Comment: Performed at [facility], [address] [place], [place] [address]  Sedimentation rate     Status: Abnormal   Collection Time: 12/14/19 11:15 AM  Result Value Ref Range   Sed Rate 108 (H) 0 - 16 mm/hr    Comment: Performed at [facility], [address] [place], [place] [address]  C-reactive protein     Status: Abnormal   Collection Time: 12/14/19 11:15 AM  Result Value Ref Range   CRP 11.2 (H) <1.0 mg/dL    Comment: Performed at [facility],  [address] [place], [place] [address]  Lactic acid, plasma     Status: Abnormal   Collection Time: 12/14/19 11:15 AM  Result Value Ref Range   Lactic Acid, Venous 2.1 (HH) 0.5 - 1.9 mmol/L    Comment: CRITICAL RESULT CALLED TO, READ BACK BY AND VERIFIED WITHKatha Cabal: BINGHAM,S. RN AT 1159 12/14/19 MULLINS,T Performed at [facility], [address] [place], [place] [address]   CBG monitoring, ED     Status: None   Collection Time: 12/14/19 11:25 AM  Result Value Ref Range   Glucose-Capillary 98 70 - 99 mg/dL    Comment: Glucose reference range applies only to samples taken after fasting for at least 8 hours.  Lactic acid, plasma     Status: Abnormal   Collection Time: 12/14/19  3:00 PM  Result Value Ref Range   Lactic Acid, Venous 2.0 (HH) 0.5 - 1.9 mmol/L    Comment: CRITICAL VALUE NOTED.  VALUE IS CONSISTENT WITH PREVIOUSLY REPORTED AND CALLED VALUE. Performed at [facility], [address] [place], [place] [address]   Urinalysis, Routine w reflex microscopic     Status: Abnormal   Collection Time: 12/14/19  5:00 PM  Result Value Ref Range   Color, Urine YELLOW YELLOW   APPearance HAZY (A) CLEAR   Specific Gravity, Urine 1.040 (H) 1.005 - 1.030   pH 5.0 5.0 - 8.0   Glucose, UA NEGATIVE NEGATIVE mg/dL   Hgb urine dipstick MODERATE (A) NEGATIVE   Bilirubin Urine NEGATIVE NEGATIVE   Ketones, ur NEGATIVE NEGATIVE mg/dL   Protein, ur 30 (A) NEGATIVE mg/dL   Nitrite NEGATIVE NEGATIVE   Leukocytes,Ua NEGATIVE NEGATIVE   RBC / HPF 0-5 0 - 5 RBC/hpf   WBC, UA 0-5 0 - 5 WBC/hpf   Bacteria, UA RARE (A) NONE SEEN   Squamous Epithelial / LPF 0-5 0 - 5   Mucus PRESENT    Hyaline Casts, UA PRESENT     Comment: Performed at [facility], [address] [place], [place] [address]  Respiratory Panel by RT PCR (Flu A&B, Covid) - Nasopharyngeal Swab     Status: None   Collection Time: 12/14/19  6:32 PM  Specimen: Nasopharyngeal  Swab  Result Value Ref Range   SARS Coronavirus 2 by RT PCR NEGATIVE NEGATIVE    Comment: (NOTE) SARS-CoV-2 target nucleic acids are NOT DETECTED.  The SARS-CoV-2 RNA is generally detectable in upper respiratoy specimens during the acute phase of infection. The lowest concentration of SARS-CoV-2 viral copies this assay can detect is 131 copies/mL. A negative result does not preclude SARS-Cov-2 infection and should not be used as the sole basis for treatment or other patient management decisions. A negative result may occur with  improper specimen collection/handling, submission of specimen other than nasopharyngeal swab, presence of viral mutation(s) within the areas targeted by this assay, and inadequate number of viral copies (<131 copies/mL). A negative result must be combined with clinical observations, patient history, and epidemiological information. The expected result is Negative.  Fact Sheet for Patients:  https://www.moore.com/  Fact Sheet for Healthcare Providers:  https://www.young.biz/  This test is no t yet approved or cleared by the [place] FDA and  has been authorized for detection and/or diagnosis of SARS-CoV-2 by FDA under an Emergency Use Authorization (EUA). This EUA will remain  in effect (meaning this test can be used) for the duration of the COVID-19 declaration under Section 564(b)(1) of the Act, 21 U.S.C. section 360bbb-3(b)(1), unless the authorization is terminated or revoked sooner.     Influenza A by PCR NEGATIVE NEGATIVE   Influenza B by PCR NEGATIVE NEGATIVE    Comment: (NOTE) The Xpert Xpress SARS-CoV-2/FLU/RSV assay is intended as an aid in  the diagnosis of influenza from Nasopharyngeal swab specimens and  should not be used as a sole basis for treatment. Nasal washings and  aspirates are unacceptable for Xpert Xpress SARS-CoV-2/FLU/RSV  testing.  Fact Sheet for  Patients: https://www.moore.com/  Fact Sheet for Healthcare Providers: https://www.young.biz/  This test is not yet approved or cleared by the [place] FDA and  has been authorized for detection and/or diagnosis of SARS-CoV-2 by  FDA under an Emergency Use Authorization (EUA). This EUA will remain  in effect (meaning this test can be used) for the duration of the  Covid-19 declaration under Section 564(b)(1) of the Act, 21  U.S.C. section 360bbb-3(b)(1), unless the authorization is  terminated or revoked. Performed at [facility], [address] [place], [place] [address]   Procalcitonin     Status: None   Collection Time: 12/14/19  8:40 PM  Result Value Ref Range   Procalcitonin 0.34 ng/mL    Comment:        Interpretation: PCT (Procalcitonin) <= 0.5 ng/mL: Systemic infection (sepsis) is not likely. Local bacterial infection is possible. (NOTE)       Sepsis PCT Algorithm           Lower Respiratory Tract                                      Infection PCT Algorithm    ----------------------------     ----------------------------         PCT < 0.25 ng/mL                PCT < 0.10 ng/mL          Strongly encourage             Strongly discourage   discontinuation of antibiotics    initiation of antibiotics    ----------------------------     -----------------------------  PCT 0.25 - 0.50 ng/mL            PCT 0.10 - 0.25 ng/mL               OR       >80% decrease in PCT            Discourage initiation of                                            antibiotics      Encourage discontinuation           of antibiotics    ----------------------------     -----------------------------         PCT >= 0.50 ng/mL              PCT 0.26 - 0.50 ng/mL               AND        <80% decrease in PCT             Encourage initiation of                                             antibiotics       Encourage continuation            of antibiotics    ----------------------------     -----------------------------        PCT >= 0.50 ng/mL                  PCT > 0.50 ng/mL               AND         increase in PCT                  Strongly encourage                                      initiation of antibiotics    Strongly encourage escalation           of antibiotics                                     -----------------------------                                           PCT <= 0.25 ng/mL                                                 OR                                        > 80% decrease in PCT  Discontinue / Do not initiate                                             antibiotics  Performed at [facility], [address], [place], [place] [address]   Comprehensive metabolic panel     Status: Abnormal   Collection Time: 12/15/19  5:40 AM  Result Value Ref Range   Sodium 135 135 - 145 mmol/L   Potassium 4.4 3.5 - 5.1 mmol/L   Chloride 103 98 - 111 mmol/L   CO2 24 22 - 32 mmol/L   Glucose, Bld 93 70 - 99 mg/dL    Comment: Glucose reference range applies only to samples taken after fasting for at least 8 hours.   BUN 22 8 - 23 mg/dL   Creatinine, Ser 3.54 0.61 - 1.24 mg/dL   Calcium 8.6 (L) 8.9 - 10.3 mg/dL   Total Protein 6.5 6.5 - 8.1 g/dL   Albumin 2.4 (L) 3.5 - 5.0 g/dL   AST 26 15 - 41 U/L   ALT 16 0 - 44 U/L   Alkaline Phosphatase 57 38 - 126 U/L   Total Bilirubin 1.2 0.3 - 1.2 mg/dL   GFR, Estimated >65 >68 mL/min    Comment: (NOTE) Calculated using the CKD-EPI Creatinine Equation (2021)    Anion gap 8 5 - 15    Comment: Performed at [facility], [address] [place], [place] [address]  CBC with Differential     Status: Abnormal   Collection Time: 12/15/19  5:40 AM  Result Value Ref Range   WBC 9.9 4.0 - 10.5 K/uL   RBC 4.10 (L) 4.22 - 5.81 MIL/uL   Hemoglobin 10.7 (L) 13.0 - 17.0 g/dL   HCT 70.0 (L) 39 -  52 %   MCV 84.9 80.0 - 100.0 fL   MCH 26.1 26.0 - 34.0 pg   MCHC 30.7 30.0 - 36.0 g/dL   RDW 17.4 (H) 94.4 - 96.7 %   Platelets 173 150 - 400 K/uL   nRBC 0.0 0.0 - 0.2 %   Neutrophils Relative % 79 %   Neutro Abs 7.9 (H) 1.7 - 7.7 K/uL   Lymphocytes Relative 11 %   Lymphs Abs 1.1 0.7 - 4.0 K/uL   Monocytes Relative 7 %   Monocytes Absolute 0.7 0.1 - 1.0 K/uL   Eosinophils Relative 2 %   Eosinophils Absolute 0.2 0.0 - 0.5 K/uL   Basophils Relative 0 %   Basophils Absolute 0.0 0.0 - 0.1 K/uL   Immature Granulocytes 1 %   Abs Immature Granulocytes 0.05 0.00 - 0.07 K/uL    Comment: Performed at [facility], [address] [place], [place] [address]  Procalcitonin     Status: None   Collection Time: 12/15/19  5:40 AM  Result Value Ref Range   Procalcitonin 0.35 ng/mL    Comment:        Interpretation: PCT (Procalcitonin) <= 0.5 ng/mL: Systemic infection (sepsis) is not likely. Local bacterial infection is possible. (NOTE)       Sepsis PCT Algorithm           Lower Respiratory Tract  Infection PCT Algorithm    ----------------------------     ----------------------------         PCT < 0.25 ng/mL                PCT < 0.10 ng/mL          Strongly encourage             Strongly discourage   discontinuation of antibiotics    initiation of antibiotics    ----------------------------     -----------------------------       PCT 0.25 - 0.50 ng/mL            PCT 0.10 - 0.25 ng/mL               OR       >80% decrease in PCT            Discourage initiation of                                            antibiotics      Encourage discontinuation           of antibiotics    ----------------------------     -----------------------------         PCT >= 0.50 ng/mL              PCT 0.26 - 0.50 ng/mL               AND        <80% decrease in PCT             Encourage initiation of                                              antibiotics       Encourage continuation           of antibiotics    ----------------------------     -----------------------------        PCT >= 0.50 ng/mL                  PCT > 0.50 ng/mL               AND         increase in PCT                  Strongly encourage                                      initiation of antibiotics    Strongly encourage escalation           of antibiotics                                     -----------------------------                                           PCT <= 0.25 ng/mL  OR                                        > 80% decrease in PCT                                      Discontinue / Do not initiate                                             antibiotics  Performed at [facility], [address], [place], [place] [address]     DG Chest 1 View  Result Date: 12/14/2019 CLINICAL DATA:  Altered mental status. EXAM: CHEST  1 VIEW COMPARISON:  June 13 21. FINDINGS: Similar cardiac silhouette. No confluent consolidation. No visible pleural effusions or pneumothorax. Advanced bilateral shoulder degenerative change. Multilevel degenerative change of the thoracic spine, suboptimally evaluated without a lateral radiograph. IMPRESSION: No evidence of acute cardiopulmonary disease. Electronically Signed   By: Feliberto Harts MD   On: 12/14/2019 12:28   DG Wrist Complete Left  Result Date: 12/14/2019 CLINICAL DATA:  Altered mental status, pain EXAM: LEFT WRIST - COMPLETE 3+ VIEW COMPARISON:  None. FINDINGS: No displaced fracture or dislocation of the left wrist. Assessment of the carpus is limited by contracted, flexed position of the hand. Within this limitation, there is severe radiocarpal and carpal arthrosis with subchondral lucencies. The carpus is normally aligned. Diffuse soft tissue edema about the hand and wrist. IMPRESSION: 1.  No displaced fracture or dislocation of the left wrist  2. Assessment of the carpus is limited by contracted, flexed position of the hand. Within this limitation, there is severe radiocarpal and carpal arthrosis with subchondral lucencies suggestive of erosive arthropathy such as gout. 3.  Diffuse soft tissue edema about the hand and wrist. Electronically Signed   By: Lauralyn Primes M.D.   On: 12/14/2019 12:37   CT Head Wo Contrast  Result Date: 12/14/2019 CLINICAL DATA:  Delirium. Additional history provided: Weakness, pain with movement. EXAM: CT HEAD WITHOUT CONTRAST TECHNIQUE: Contiguous axial images were obtained from the base of the skull through the vertex without intravenous contrast. COMPARISON:  Prior head CT 08/10/2019.  Brain MRI 02/01/2014. FINDINGS: Brain: Moderate generalized cerebral atrophy. Redemonstrated chronic infarct within the left corona radiata/basal ganglia. Additional redemonstrated small chronic infarcts within the deep gray nuclei. Background advanced ill-defined hypoattenuation within the cerebral white matter is nonspecific, but compatible with chronic small vessel ischemic disease. There is no acute intracranial hemorrhage. No demarcated cortical infarct. No extra-axial fluid collection. No evidence of intracranial mass. No midline shift. Vascular: No hyperdense vessel.  Atherosclerotic calcifications. Skull: Normal. Negative for fracture or focal lesion. Sinuses/Orbits: Visualized orbits show no acute finding. Chronic medially displaced deformity of the left lamina papyracea. No significant paranasal sinus disease at the imaged levels. IMPRESSION: No evidence of acute intracranial abnormality. Redemonstrated severe chronic small vessel ischemic disease with multiple chronic small-vessel infarcts. Stable, moderate cerebral atrophy. Electronically Signed   By: Jackey Loge DO   On: 12/14/2019 14:47   CT ABDOMEN PELVIS W CONTRAST  Result Date: 12/14/2019 CLINICAL DATA:  Abdominal pain EXAM: CT ABDOMEN AND PELVIS WITH CONTRAST  TECHNIQUE: Multidetector CT imaging  of the abdomen and pelvis was performed using the standard protocol following bolus administration of intravenous contrast. CONTRAST:  OMNIPAQUE IOHEXOL 300 MG/ML  SOLN COMPARISON:  None. FINDINGS: Lower chest: The visualized heart size within normal limits. Coronary artery calcifications are seen. No pericardial fluid/thickening. No hiatal hernia. Streaky atelectasis seen at both lung bases. Hepatobiliary: The liver is normal in density without focal abnormality.The main portal vein is patent. No evidence of calcified gallstones, gallbladder wall thickening or biliary dilatation. Pancreas: Unremarkable. No pancreatic ductal dilatation or surrounding inflammatory changes. Spleen: Normal in size without focal abnormality. Adrenals/Urinary Tract: Both adrenal glands appear normal. The kidneys and collecting system appear normal without evidence of urinary tract calculus or hydronephrosis. Bladder is unremarkable. Stomach/Bowel: The stomach, small bowel, and colon are normal in appearance. No inflammatory changes, wall thickening, or obstructive findings.Moderate stool is present throughout. Vascular/Lymphatic: There are no enlarged mesenteric, retroperitoneal, or pelvic lymph nodes. Scattered aortic atherosclerotic calcifications are seen without aneurysmal dilatation. Again noted is aneurysmal dilatation with partial thrombosis of bilateral iliac vasculature. Reproductive: The prostate is unremarkable. Other: No evidence of abdominal wall mass or hernia. Musculoskeletal: There is a comminuted mildly displaced fracture seen through the anterior and posterior left acetabulum which involve the wall and column. The patient is status post ORIF with intramedullary nail fixation of the left femur. 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 IMPRESSION: Comminuted mildly displaced fractures through the anterior and posterior left acetabulum. Aortic Atherosclerosis (ICD10-I70.0). 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 Electronically Signed   By: Jonna Clark M.D.   On: 12/14/2019 17:26   DG Pelvis Comp Min 3V  Result Date: 12/14/2019 CLINICAL DATA:  Acetabular fractures. EXAM: JUDET PELVIS - 3+ VIEW COMPARISON:  CT scan, same date. FINDINGS: Complex comminuted left acetabular fractures are again demonstrated. The humeral head is normally located. Intramedullary gamma nail and dynamic hip screw are in place along with a proximal cerclage wire. The right hip demonstrates severe degenerative changes and mild protrusio. IMPRESSION: Complex comminuted left acetabular fractures. Electronically Signed   By: Rudie Meyer M.D.   On: 12/14/2019 19:41   DG Hand Complete Left  Result Date: 12/14/2019 CLINICAL DATA:  Left hand and wrist pain.  Nonverbal. EXAM: LEFT HAND - COMPLETE 3+ VIEW COMPARISON:  Left wrist radiographs obtained at the same time. FINDINGS: Diffuse swelling of the hand. Mild carpal degenerative changes. Moderate distal radioulnar joint degenerative spur formation. No fracture, dislocation, bone destruction, periosteal reaction or soft tissue gas. IMPRESSION: 1. Diffuse swelling of the hand without fracture. 2. Wrist degenerative changes. Electronically Signed   By: Beckie Salts M.D.   On: 12/14/2019 12:31    Review of Systems  Unable to perform ROS: Dementia  Musculoskeletal: Positive for arthralgias (Left thigh).   Blood pressure 120/87, pulse (!) 102, temperature 98.2 F (36.8 C), temperature source Oral, resp. rate 16, height  (1.727 m), weight 67.5 kg, SpO2 100 %. Physical Exam Constitutional:      General: He is not in acute distress.    Appearance: He is well-developed. He is not diaphoretic.  HENT:     Head: Normocephalic and atraumatic.  Eyes:     General: No scleral icterus.       Right eye: No discharge.        Left eye: No discharge.     Conjunctiva/sclera: Conjunctivae normal.  Cardiovascular:     Rate and Rhythm: Normal  rate and regular rhythm.  Pulmonary:     Effort:  Pulmonary effort is normal. No respiratory distress.  Musculoskeletal:     Cervical back: Normal range of motion.     Comments: Pelvis--no traumatic wounds or rash, no ecchymosis, stable to manual stress, nontender  LLE -- TTP proximal thigh, no erythema or mass  Skin:    General: Skin is warm and dry.  Neurological:     Mental Status: He is alert.  Psychiatric:        Behavior: Behavior normal.     Assessment/Plan: Left acet fx -- Plan TDWB LLE though imagine this will be tough to enforce, especially given his left hemiparesis. F/u with Dr. Carola Frost in 2-3 weeks to assess healing. Multiple medical problems including asthma, dementia, hypertension, seizures, history of other known embolic CVA with residual left-sided hemiparesis -- per primary service    Freeman Caldron, PA-C Orthopedic Surgery [phone] 12/15/2019, 10:28 AM

## 2019-12-15 NOTE — Progress Notes (Signed)
Initial Nutrition Assessment  DOCUMENTATION CODES:   Not applicable  INTERVENTION:  - will order 30 ml Prosource Plus once/day, each supplement provides 100 kcal and 15 grams protein.  - will monitor for additional nutrition-related needs throughout hospitalization.    NUTRITION DIAGNOSIS:   Increased nutrient needs related to acute illness as evidenced by estimated needs.  GOAL:   Patient will meet greater than or equal to 90% of their needs  MONITOR:   PO intake, Supplement acceptance, Labs, Weight trends  REASON FOR ASSESSMENT:   Malnutrition Screening Tool, Consult Hip fracture protocol  ASSESSMENT:   85 y.o. male with medical history of asthma, dementia, HTN, seizures, and CVA with residual L-sided hemiparesis. He presented to the ED via EMS due to L hand swelling/tenderness for several weeks, weakness, decreased oral intake, worsening confusion, and recent abdominal and back pain.  Diet advanced from NPO to Heart Healthy at 1207; lunch has been ordered but not yet received. Patient noted to be a/o to self only. He was laying in bed with daughter at bedside. Daughter provides all information and notes that patient lives with her.  Patient does have difficulty swallowing rice and other items that are too small d/t him not chewing these items thoroughly. He has no problems chewing other than with tough meats. He is able to drink all liquids without issue. He greatly enjoys water, lemon and limeades, and grape juice.   Patient is able to feed himself although he is often messy with doing so. He experiences gas and diarrhea if he eats too much yogurt, cheese, milk, peanut butter, eggs.   Daughter reports patient has a very good appetite at baseline and that he has not had any recent changes in appetite or desire to eat.   Weight yesterday was 149 lb and PTA the most recently documented weight was on 08/20/17 when he weighed 158 lb. This indicates 9 lb weight loss (5.7% body  weight) in the past >2 years.   Labs reviewed; Ca: 8.6 mg/dl. Medications reviewed; 40 mg deltasone/day, 1 tablet senokot BID.      NUTRITION - FOCUSED PHYSICAL EXAM:    Most Recent Value  Orbital Region No depletion  Upper Arm Region No depletion  Thoracic and Lumbar Region Unable to assess  Buccal Region No depletion  Temple Region No depletion  Clavicle Bone Region Mild depletion  Clavicle and Acromion Bone Region Mild depletion  Scapular Bone Region No depletion  Dorsal Hand No depletion  Patellar Region Unable to assess  Anterior Thigh Region Unable to assess  Posterior Calf Region Unable to assess  Edema (RD Assessment) Unable to assess  Hair Reviewed  Eyes Reviewed  Mouth Reviewed  Skin Reviewed  Nails Reviewed       Diet Order:   Diet Order            Diet Heart Room service appropriate? Yes; Fluid consistency: Thin  Diet effective now                 EDUCATION NEEDS:   No education needs have been identified at this time  Skin:  Skin Assessment: Reviewed RN Assessment  Last BM:  PTA/unknown  Height:   Ht Readings from Last 1 Encounters:  12/14/19 5\' 8"  (1.727 m)    Weight:   Wt Readings from Last 1 Encounters:  12/14/19 67.5 kg     Estimated Nutritional Needs:  Kcal:  1600-1800 kcal Protein:  70-80 grams Fluid:  >/= 1.8 L/day  Dontre Laduca, MS, RD, LDN, CNSC Inpatient Clinical Dietitian RD pager # available in AMION  After hours/weekend pager # available in AMION  

## 2019-12-15 NOTE — Progress Notes (Signed)
PROGRESS NOTE    Johnathan Mitchell.  AJG:[id number] DOB: 07-Jul-1934 DOA: 12/14/2019 [facility]    Brief Narrative:  85 y.o. male with medical history significant of asthma, dementia, hypertension, seizures, history of other known embolic CVA with residual left-sided hemiparesis who is brought to the emergency department via EMS due to due to left hand swelling/tenderness for the past few weeks associated with weakness, decreased oral intake and worsening confusion.  He was seen at [facility] where he had a negative x-ray and was put in a brace.  He has also been having lower abdominal and back pain recently.   In the ED, L hand xray was notable for diffuse swelling without fracture or dislocation, bone destruction, or periostel reaction. Pelvic xray was notable for comminuted L acetabular fracture  Assessment & Plan:   Principal Problem:   Closed left acetabular fracture (HCC) Active Problems:   Benign essential HTN   Dementia with behavioral disturbance (HCC)   Seizures (HCC)   SIRS (systemic inflammatory response syndrome) (HCC)   Cellulitis of left hand  Principal Problem:   Closed left acetabular fracture [facility]) -Orthopedic Surgery following -Recommendation for TDWB LLE and to f/u with Dr. Marcelino Scot in 2-3 weeks to assess healing -continue with analgesics as needed -PT/OT consulted.  Active Problems:   SIRS (systemic inflammatory response syndrome) (HCC) ruled out In the setting of trauma and leukocytosis at time of presentation    Cellulitis of left hand ruled out Afebrile, no leukocytosis this AM Was started on empiric abx at time of presentation, will d/c abx Procalcitonin of 0.35 Blood cultures neg ESR elevated. L wrist with swelling noted on imaging. Trial of prednisone given    Benign essential HTN BP stable at this time Continue antihypertensive as needed.     Dementia with behavioral disturbance (HCC) Off Depakote ER and Lexapro. Continue  reorientation as needed.    Seizures ([place]) Continued on Vimpat 100 mg p.o. twice daily.   DVT prophylaxis: Lovenox subq Code Status: Full Family Communication: Pt in room, family not at bedside  Status is: Observation  The patient remains OBS appropriate and will d/c before 2 midnights.  Dispo: The patient is from: Home              Anticipated d/c is to: Home              Anticipated d/c date is: 2 days              Patient currently is not medically stable to d/c.       Consultants:   Orthopedic Surgery  Procedures:     Antimicrobials: Anti-infectives (From admission, onward)   Start     Dose/Rate Route Frequency Ordered Stop   12/15/19 1600  vancomycin (VANCOREADY) IVPB 750 mg/150 mL  Status:  Discontinued        750 mg 150 mL/hr over 60 Minutes Intravenous Every 24 hours 12/14/19 1926 12/15/19 1004   12/14/19 2200  ceFEPIme (MAXIPIME) 2 g in sodium chloride 0.9 % 100 mL IVPB  Status:  Discontinued        2 g 200 mL/hr over 30 Minutes Intravenous Every 12 hours 12/14/19 1926 12/15/19 1004   12/14/19 1215  ceFEPIme (MAXIPIME) 2 g in sodium chloride 0.9 % 100 mL IVPB        2 g 200 mL/hr over 30 Minutes Intravenous  Once 12/14/19 1206 12/14/19 1530   12/14/19 1215  vancomycin (VANCOCIN) IVPB 1000 mg/200 mL premix  1,000 mg 200 mL/hr over 60 Minutes Intravenous  Once 12/14/19 1206 12/14/19 2058       Subjective: Complaining of L hand pain  Objective: Vitals:   12/15/19 0206 12/15/19 0611 12/15/19 1002 12/15/19 1248  BP: (!) 140/98 122/77 120/87 130/89  Pulse: (!) 104 (!) 108 (!) 102 100  Resp: _0 Temp: 97.7 F (36.5 C) 98.2 F (36.8 C) 98.2 F (36.8 C) 98.4 F (36.9 C)  TempSrc: Oral Oral Oral Oral  SpO2: 95% 92% 100% 100%  Weight:      Height:        Intake/Output Summary (Last 24 hours) at 12/15/2019 1403 Last data filed at 12/15/2019 0552 Gross per 24 hour  Intake 300 ml  Output --  Net 300 ml   Filed Weights    12/14/19 2200  Weight: 67.5 kg    Examination:  General exam: Appears calm and comfortable  Respiratory system: Clear to auscultation. Respiratory effort normal. Cardiovascular system: S1 & S2 heard, Regular Gastrointestinal system: Abdomen is nondistended, soft and nontender. No organomegaly or masses felt. Normal bowel sounds heard. Central nervous system: Alert and oriented. No focal neurological deficits. Extremities: Symmetric 5 x 5 power. Skin: No rashes, lesions Psychiatry: Judgement and insight appear normal. Mood & affect appropriate.   Data Reviewed: I have personally reviewed following labs and imaging studies  CBC: Recent Labs  Lab 12/14/19 1115 12/15/19 0540  WBC 14.1* 9.9  NEUTROABS 12.6* 7.9*  HGB 13.0 10.7*  HCT 40.7 34.8*  MCV 81.7 84.9  PLT 238 967   Basic Metabolic Panel: Recent Labs  Lab 12/14/19 1115 12/15/19 0540  NA 138 135  K 4.0 4.4  CL 98 103  CO2 27 24  GLUCOSE 110* 93  BUN 23 22  CREATININE 1.21 1.14  CALCIUM 10.1 8.6*   GFR: Estimated Creatinine Clearance: 46.9 mL/min (by C-G formula based on SCr of 1.14 mg/dL). Liver Function Tests: Recent Labs  Lab 12/14/19 1115 12/15/19 0540  AST 25 26  ALT 24 16  ALKPHOS 76 57  BILITOT 0.8 1.2  PROT 8.9* 6.5  ALBUMIN 3.5 2.4*   Recent Labs  Lab 12/14/19 1115  LIPASE 27   No results for input(s): AMMONIA in the last 168 hours. Coagulation Profile: No results for input(s): INR, PROTIME in the last 168 hours. Cardiac Enzymes: No results for input(s): CKTOTAL, CKMB, CKMBINDEX, TROPONINI in the last 168 hours. BNP (last 3 results) No results for input(s): PROBNP in the last 8760 hours. HbA1C: No results for input(s): HGBA1C in the last 72 hours. CBG: Recent Labs  Lab 12/14/19 1125  GLUCAP 98   Lipid Profile: No results for input(s): CHOL, HDL, LDLCALC, TRIG, CHOLHDL, LDLDIRECT in the last 72 hours. Thyroid Function Tests: No results for input(s): TSH, T4TOTAL, FREET4, T3FREE,  THYROIDAB in the last 72 hours. Anemia Panel: No results for input(s): VITAMINB12, FOLATE, FERRITIN, TIBC, IRON, RETICCTPCT in the last 72 hours. Sepsis Labs: Recent Labs  Lab 12/14/19 1115 12/14/19 1500 12/14/19 2040 12/15/19 0540  PROCALCITON  --   --  0.34 0.35  LATICACIDVEN 2.1* 2.0*  --   --     Recent Results (from the past 240 hour(s))  Culture, blood (routine x 2)     Status: None (Preliminary result)   Collection Time: 12/14/19 12:48 PM   Specimen: BLOOD  Result Value Ref Range Status   Specimen Description   Final    BLOOD RIGHT ARM Performed at [facility],  [place] [address], [place], [place] [address]    Special Requests   Final    BOTTLES DRAWN AEROBIC AND ANAEROBIC Blood Culture results may not be optimal due to an inadequate volume of blood received in culture bottles Performed at [place] [address], [place], [place] [address]    Culture   Final    NO GROWTH < 24 HOURS Performed at [facility] [address] [place], [place] [address]    Report Status PENDING  Incomplete  Culture, blood (routine x 2)     Status: None (Preliminary result)   Collection Time: 12/14/19  1:45 PM   Specimen: BLOOD  Result Value Ref Range Status   Specimen Description   Final    BLOOD LEFT ARM Performed at [facility] [address] [place], [place] [address]    Special Requests   Final    BOTTLES DRAWN AEROBIC AND ANAEROBIC Blood Culture results may not be optimal due to an inadequate volume of blood received in culture bottles Performed at [facility] [address] [place], [place] [address]    Culture   Final    NO GROWTH < 24 HOURS Performed at [facility] [address] [place], [place] [address]    Report Status PENDING  Incomplete  Respiratory Panel by RT PCR (Flu A&B, Covid) - Nasopharyngeal Swab     Status: None   Collection Time: 12/14/19  6:32 PM    Specimen: Nasopharyngeal Swab  Result Value Ref Range Status   SARS Coronavirus 2 by RT PCR NEGATIVE NEGATIVE Final    Comment: (NOTE) SARS-CoV-2 target nucleic acids are NOT DETECTED.  The SARS-CoV-2 RNA is generally detectable in upper respiratoy specimens during the acute phase of infection. The lowest concentration of SARS-CoV-2 viral copies this assay can detect is 131 copies/mL. A negative result does not preclude SARS-Cov-2 infection and should not be used as the sole basis for treatment or other patient management decisions. A negative result may occur with  improper specimen collection/handling, submission of specimen other than nasopharyngeal swab, presence of viral mutation(s) within the areas targeted by this assay, and inadequate number of viral copies (<131 copies/mL). A negative result must be combined with clinical observations, patient history, and epidemiological information. The expected result is Negative.  Fact Sheet for Patients:  PinkCheek.be  Fact Sheet for Healthcare Providers:  GravelBags.it  This test is no t yet approved or cleared by the [place] FDA and  has been authorized for detection and/or diagnosis of SARS-CoV-2 by FDA under an Emergency Use Authorization (EUA). This EUA will remain  in effect (meaning this test can be used) for the duration of the COVID-19 declaration under Section 564(b)(1) of the Act, 21 U.S.C. section 360bbb-3(b)(1), unless the authorization is terminated or revoked sooner.     Influenza A by PCR NEGATIVE NEGATIVE Final   Influenza B by PCR NEGATIVE NEGATIVE Final    Comment: (NOTE) The Xpert Xpress SARS-CoV-2/FLU/RSV assay is intended as an aid in  the diagnosis of influenza from Nasopharyngeal swab specimens and  should not be used as a sole basis for treatment. Nasal washings and  aspirates are unacceptable for Xpert Xpress SARS-CoV-2/FLU/RSV   testing.  Fact Sheet for Patients: PinkCheek.be  Fact Sheet for Healthcare Providers: GravelBags.it  This test is not yet approved or cleared by the [place] FDA and  has been authorized for detection and/or diagnosis of SARS-CoV-2 by  FDA under an Emergency Use Authorization (EUA). This EUA will remain  in effect (meaning this test can be used) for the duration of the  Covid-19 declaration under Section 564(b)(1) of the Act, 21  U.S.C. section 360bbb-3(b)(1), unless the authorization is  terminated or revoked. Performed at [facility], [place] [address] [place], [place] [address]      Radiology Studies: DG Chest 1 View  Result Date: 12/14/2019 CLINICAL DATA:  Altered mental status. EXAM: CHEST  1 VIEW COMPARISON:  June 13 21. FINDINGS: Similar cardiac silhouette. No confluent consolidation. No visible pleural effusions or pneumothorax. Advanced bilateral shoulder degenerative change. Multilevel degenerative change of the thoracic spine, suboptimally evaluated without a lateral radiograph. IMPRESSION: No evidence of acute cardiopulmonary disease. Electronically Signed   By: Margaretha Sheffield MD   On: 12/14/2019 12:28   DG Wrist Complete Left  Result Date: 12/14/2019 CLINICAL DATA:  Altered mental status, pain EXAM: LEFT WRIST - COMPLETE 3+ VIEW COMPARISON:  None. FINDINGS: No displaced fracture or dislocation of the left wrist. Assessment of the carpus is limited by contracted, flexed position of the hand. Within this limitation, there is severe radiocarpal and carpal arthrosis with subchondral lucencies. The carpus is normally aligned. Diffuse soft tissue edema about the hand and wrist. IMPRESSION: 1.  No displaced fracture or dislocation of the left wrist 2. Assessment of the carpus is limited by contracted, flexed position of the hand. Within this limitation, there is severe radiocarpal and carpal  arthrosis with subchondral lucencies suggestive of erosive arthropathy such as gout. 3.  Diffuse soft tissue edema about the hand and wrist. Electronically Signed   By: Eddie Candle M.D.   On: 12/14/2019 12:37   CT Head Wo Contrast  Result Date: 12/14/2019 CLINICAL DATA:  Delirium. Additional history provided: Weakness, pain with movement. EXAM: CT HEAD WITHOUT CONTRAST TECHNIQUE: Contiguous axial images were obtained from the base of the skull through the vertex without intravenous contrast. COMPARISON:  Prior head CT 08/10/2019.  Brain MRI 02/01/2014. FINDINGS: Brain: Moderate generalized cerebral atrophy. Redemonstrated chronic infarct within the left corona radiata/basal ganglia. Additional redemonstrated small chronic infarcts within the deep gray nuclei. Background advanced ill-defined hypoattenuation within the cerebral white matter is nonspecific, but compatible with chronic small vessel ischemic disease. There is no acute intracranial hemorrhage. No demarcated cortical infarct. No extra-axial fluid collection. No evidence of intracranial mass. No midline shift. Vascular: No hyperdense vessel.  Atherosclerotic calcifications. Skull: Normal. Negative for fracture or focal lesion. Sinuses/Orbits: Visualized orbits show no acute finding. Chronic medially displaced deformity of the left lamina papyracea. No significant paranasal sinus disease at the imaged levels. IMPRESSION: No evidence of acute intracranial abnormality. Redemonstrated severe chronic small vessel ischemic disease with multiple chronic small-vessel infarcts. Stable, moderate cerebral atrophy. Electronically Signed   By: Kellie Simmering DO   On: 12/14/2019 14:47   CT ABDOMEN PELVIS W CONTRAST  Result Date: 12/14/2019 CLINICAL DATA:  Abdominal pain EXAM: CT ABDOMEN AND PELVIS WITH CONTRAST TECHNIQUE: Multidetector CT imaging of the abdomen and pelvis was performed using the standard protocol following bolus administration of intravenous  contrast. CONTRAST:  131m OMNIPAQUE IOHEXOL 300 MG/ML  SOLN COMPARISON:  None. FINDINGS: Lower chest: The visualized heart size within normal limits. Coronary artery calcifications are seen. No pericardial fluid/thickening. No hiatal hernia. Streaky atelectasis seen at both lung bases. Hepatobiliary: The liver is normal in density without focal abnormality.The main portal vein is patent. No evidence of calcified gallstones, gallbladder wall thickening or biliary dilatation.  Pancreas: Unremarkable. No pancreatic ductal dilatation or surrounding inflammatory changes. Spleen: Normal in size without focal abnormality. Adrenals/Urinary Tract: Both adrenal glands appear normal. The kidneys and collecting system appear normal without evidence of urinary tract calculus or hydronephrosis. Bladder is unremarkable. Stomach/Bowel: The stomach, small bowel, and colon are normal in appearance. No inflammatory changes, wall thickening, or obstructive findings.Moderate stool is present throughout. Vascular/Lymphatic: There are no enlarged mesenteric, retroperitoneal, or pelvic lymph nodes. Scattered aortic atherosclerotic calcifications are seen without aneurysmal dilatation. Again noted is aneurysmal dilatation with partial thrombosis of bilateral iliac vasculature. Reproductive: The prostate is unremarkable. Other: No evidence of abdominal wall mass or hernia. Musculoskeletal: There is a comminuted mildly displaced fracture seen through the anterior and posterior left acetabulum which involve the wall and column. The patient is status post ORIF with intramedullary nail fixation of the left femur. 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 IMPRESSION: Comminuted mildly displaced fractures through the anterior and posterior left acetabulum. Aortic Atherosclerosis (ICD10-I70.0). 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 Electronically Signed   By: Prudencio Pair M.D.   On: 12/14/2019 17:26   DG Pelvis Comp Min  3V  Result Date: 12/14/2019 CLINICAL DATA:  Acetabular fractures. EXAM: JUDET PELVIS - 3+ VIEW COMPARISON:  CT scan, same date. FINDINGS: Complex comminuted left acetabular fractures are again demonstrated. The humeral head is normally located. Intramedullary gamma nail and dynamic hip screw are in place along with a proximal cerclage wire. The right hip demonstrates severe degenerative changes and mild protrusio. IMPRESSION: Complex comminuted left acetabular fractures. Electronically Signed   By: Marijo Sanes M.D.   On: 12/14/2019 19:41   DG Hand Complete Left  Result Date: 12/14/2019 CLINICAL DATA:  Left hand and wrist pain.  Nonverbal. EXAM: LEFT HAND - COMPLETE 3+ VIEW COMPARISON:  Left wrist radiographs obtained at the same time. FINDINGS: Diffuse swelling of the hand. Mild carpal degenerative changes. Moderate distal radioulnar joint degenerative spur formation. No fracture, dislocation, bone destruction, periosteal reaction or soft tissue gas. IMPRESSION: 1. Diffuse swelling of the hand without fracture. 2. Wrist degenerative changes. Electronically Signed   By: Claudie Revering M.D.   On: 12/14/2019 12:31    Scheduled Meds: . (feeding supplement) PROSource Plus  30 mL Oral Daily  . atorvastatin  80 mg Oral q1800  . enoxaparin (LOVENOX) injection  40 mg Subcutaneous Q24H  . predniSONE  40 mg Oral Q breakfast  . senna-docusate  1 tablet Oral BID   Continuous Infusions:   LOS: 0 days   Marylu Lund, MD [facility] Pager On Amion  If 7PM-7AM, please contact night-coverage 12/15/2019, 2:03 PM

## 2019-12-15 NOTE — Progress Notes (Signed)
Consult request received. Full consultation to follow. Will treat minimally displaced left acetabular fracture with TDWB and mobilization with PT.  [name], MD [facility], [facility] [phone]

## 2019-12-15 NOTE — Evaluation (Signed)
Physical Therapy Evaluation Patient Details Name: Johnathan Mitchell. MRN: [id number] DOB: Aug 07, 1934 Today's Date: 12/15/2019   History of Present Illness  Patient is 84 y.o. male with PMH significant for asthma, dementia, HTN, seizures, embolic CVA with residual left-sided hemiparesis. Pt admitted yesterday with a couple week hx/o hand swelling/pain. He also c/o some groin pain so a pelvic x-ray was done. This showed a left acetabular fx and orthopedic surgery was consulted, suspect gout in Lt wrist. There's no hx/o fall.   Clinical Impression  Johnathan Mitchell. is 85 y.o. male admitted with above HPI and diagnosis. Patient is currently limited by functional impairments below (see PT problem list). Patient lives with his daugther and requires assist for all mobility and ADL's at baseline. She reports ~2 months ago he was able to transfer bed<>chair with min assist. Pt's daughter is trying to obtain additional home assist/aide through the [place] and is having difficulty. Patient will benefit from continued skilled PT interventions to address impairments and progress independence with mobility, recommending SNF with 24/7 assist. Acute PT will follow and progress as able.       12/15/19 1200  PT Visit Information  Last PT Received On 12/15/19  Assistance Needed +2  History of Present Illness Patient is 85 y.o. male with PMH significant for asthma, dementia, HTN, seizures, embolic CVA with residual left-sided hemiparesis. Pt admitted yesterday with a couple week hx/o hand swelling/pain. He also c/o some groin pain so a pelvic x-ray was done. This showed a left acetabular fx and orthopedic surgery was consulted, suspect gout in Lt wrist. There's no hx/o fall.  Precautions  Precautions Fall  Restrictions  Weight Bearing Restrictions Yes  LLE Weight Bearing TWB  Home Living  Family/patient expects to be discharged to: Private residence  Living Arrangements Children (pt lives with daughter)  Available  Help at Discharge Family  Type of Home House  Home Access Ramped entrance  Home Layout One level  Bathroom Shower/Tub Other (comment) (pt does sponge bath in bed)  [employer]  (wears depends and does not use toilet)  Bathroom Accessibility No  Home Equipment Walker - 2 wheels;BSC;Wheelchair - manual  Prior Function  Level of Independence Needs assistance  Gait / Transfers Assistance Needed pt's has not ambulated since ~ 2014, he was completing stand pivot transfer ~2+ months ago with minimal assist from his daughter. He now requires MAX assist. He gets out of bed to wheelchair ~ 3x/week.   ADL's / Homemaking Assistance Needed TOTAL Assist from daughter for bed bath and all ADL's  Communication / Swallowing Assistance Needed Pt can feed himself but requires set up assist from daughter.  Comments Pt's daughter provides all of home living and PLOF history as pt is unable to do so.  Communication  Communication No difficulties  Pain Assessment  Pain Assessment Faces  Faces Pain Scale 6  Pain Location Lt wrist and Lt LE  Pain Descriptors / Indicators Grimacing;Guarding  Pain Intervention(s) Limited activity within patient's tolerance;Monitored during session;Repositioned  Cognition  Arousal/Alertness Awake/alert  Behavior During Therapy Anxious  Overall Cognitive Status History of cognitive impairments - at baseline  Area of Impairment Orientation;Memory;Attention;Safety/judgement;Following commands;Awareness;Problem solving  Orientation Level Disoriented to;Situation;Time  Current Attention Level Focused  Memory Decreased short-term memory;Decreased recall of precautions  Following Commands Follows one step commands inconsistently;Follows multi-step commands inconsistently  Safety/Judgement Decreased awareness of safety;Decreased awareness of deficits  Awareness Intellectual  Problem Solving Slow processing;Decreased initiation;Requires verbal cues;Requires tactile cues;Difficulty  sequencing  Upper Extremity Assessment  Upper Extremity Assessment Generalized weakness;LUE deficits/detail  LUE Deficits / Details pt limited with Lt wrist due to pain and edema, skin glossy. pt unable to tolerate AROM or PROM  Lower Extremity Assessment  Lower Extremity Assessment Generalized weakness;LLE deficits/detail  LLE Deficits / Details pt limited by Lt hip/thigh pain.   Cervical / Trunk Assessment  Cervical / Trunk Assessment Kyphotic  Bed Mobility  Overal bed mobility Needs Assistance  Bed Mobility Supine to Sit;Sit to Supine  Supine to sit Max assist;Total assist;+2 for safety/equipment;HOB elevated;+2 for physical assistance  Sit to supine Max assist;+2 for physical assistance;+2 for safety/equipment;Total assist  General bed mobility comments Pt with great difficulty to sequence LE's and UE's to move to EOB. Patient required MAX assist to attempt reaching Rt UE to bed rail and roll upper trunk. After adjusting LE's halfway off EOB and attempting to raise trunk, pt laid back in bed and MAX assist provided to return to supine.  PT - End of Session  Equipment Utilized During Treatment Gait belt  Activity Tolerance Patient limited by pain  Patient left in bed;with call bell/phone within reach;with bed alarm set;with family/visitor present  Nurse Communication Mobility status  PT Assessment  PT Recommendation/Assessment Patient needs continued PT services  PT Visit Diagnosis Other abnormalities of gait and mobility (R26.89);Muscle weakness (generalized) (M62.81);Pain  Pain - Right/Left Left  Pain - part of body Hand;Hip  PT Problem List Decreased strength;Decreased range of motion;Decreased activity tolerance;Decreased balance  PT Plan  PT Frequency (ACUTE ONLY) 7X/week  PT Treatment/Interventions (ACUTE ONLY) DME instruction;Gait training;Stair training;Functional mobility training;Therapeutic exercise;Therapeutic activities;Balance training;Patient/family education  AM-PAC PT  "6 Clicks" Mobility Outcome Measure (Version 2)  Help needed turning from your back to your side while in a flat bed without using bedrails? 2  Help needed moving from lying on your back to sitting on the side of a flat bed without using bedrails? 1  Help needed moving to and from a bed to a chair (including a wheelchair)? 1  Help needed standing up from a chair using your arms (e.g., wheelchair or bedside chair)? 1  Help needed to walk in hospital room? 1  Help needed climbing 3-5 steps with a railing?  1  6 Click Score 7  Consider Recommendation of Discharge To: CIR/SNF/LTACH  PT Recommendation  Recommendations for Other Services OT consult  Follow Up Recommendations SNF;Supervision/Assistance - 24 hour  PT equipment None recommended by PT;Other (comment) (mechanical lift)  Individuals Consulted  Consulted and Agree with Results and Recommendations Patient  Acute Rehab PT Goals  Patient Stated Goal none stated by pt, daughter wants him to be able to transfer  PT Goal Formulation With patient  Time For Goal Achievement 12/29/19  Potential to Achieve Goals Fair  PT Time Calculation  PT Start Time (ACUTE ONLY) 1147  PT Stop Time (ACUTE ONLY) 1223  PT Time Calculation (min) (ACUTE ONLY) 36 min  PT General Charges  $$ ACUTE PT VISIT 1 Visit  PT Evaluation  $PT Eval Moderate Complexity 1 Mod  PT Treatments  $Therapeutic Activity 8-22 mins    Wynn Maudlin, DPT Acute Rehabilitation Services  Office [phone] Pager [phone]  12/15/2019 4:15 PM

## 2019-12-16 DIAGNOSIS — F0151 Vascular dementia with behavioral disturbance: Secondary | ICD-10-CM | POA: Diagnosis not present

## 2019-12-16 DIAGNOSIS — L03114 Cellulitis of left upper limb: Secondary | ICD-10-CM | POA: Diagnosis not present

## 2019-12-16 LAB — COMPREHENSIVE METABOLIC PANEL
ALT: 15 U/L (ref 0–44)
AST: 23 U/L (ref 15–41)
Albumin: 2.5 g/dL — ABNORMAL LOW (ref 3.5–5.0)
Alkaline Phosphatase: 54 U/L (ref 38–126)
Anion gap: 12 (ref 5–15)
BUN: 24 mg/dL — ABNORMAL HIGH (ref 8–23)
CO2: 22 mmol/L (ref 22–32)
Calcium: 8.5 mg/dL — ABNORMAL LOW (ref 8.9–10.3)
Chloride: 103 mmol/L (ref 98–111)
Creatinine, Ser: 0.97 mg/dL (ref 0.61–1.24)
GFR, Estimated: >60 mL/min (ref >60)
Glucose, Bld: 128 mg/dL — ABNORMAL HIGH (ref 70–99)
Potassium: 4.1 mmol/L (ref 3.5–5.1)
Sodium: 137 mmol/L (ref 135–145)
Total Bilirubin: 0.6 mg/dL (ref 0.3–1.2)
Total Protein: 6.6 g/dL (ref 6.5–8.1)

## 2019-12-16 LAB — URINE CULTURE: Culture: NO GROWTH

## 2019-12-16 LAB — CBC
HCT: 33.3 % — ABNORMAL LOW (ref 39.0–52.0)
Hemoglobin: 10.3 g/dL — ABNORMAL LOW (ref 13.0–17.0)
MCH: 26.1 pg (ref 26.0–34.0)
MCHC: 30.9 g/dL (ref 30.0–36.0)
MCV: 84.3 fL (ref 80.0–100.0)
Platelets: 173 10*3/uL (ref 150–400)
RBC: 3.95 MIL/uL — ABNORMAL LOW (ref 4.22–5.81)
RDW: 15.7 % — ABNORMAL HIGH (ref 11.5–15.5)
WBC: 12.2 10*3/uL — ABNORMAL HIGH (ref 4.0–10.5)
nRBC: 0 % (ref 0.0–0.2)

## 2019-12-16 MED ORDER — LORAZEPAM 2 MG/ML IJ SOLN
1.0000 mg | Freq: Once | INTRAMUSCULAR | Status: AC
  Administered 2019-12-16: 1 mg via INTRAVENOUS
  Filled 2019-12-16: qty 1

## 2019-12-16 NOTE — Progress Notes (Signed)
PROGRESS NOTE    Johnathan Mitchell.  NKN:[id number] DOB: Feb 01, 1934 DOA: 12/14/2019 PCP: [facility]    Brief Narrative:  85 y.o. male with medical history significant of asthma, dementia, hypertension, seizures, history of other known embolic CVA with residual left-sided hemiparesis who is brought to the emergency department via EMS due to due to left hand swelling/tenderness for the past few weeks associated with weakness, decreased oral intake and worsening confusion.  He was seen at [facility] where he had a negative x-ray and was put in a brace.  He has also been having lower abdominal and back pain recently.   In the ED, L hand xray was notable for diffuse swelling without fracture or dislocation, bone destruction, or periostel reaction. Pelvic xray was notable for comminuted L acetabular fracture  Assessment & Plan:   Principal Problem:   Closed left acetabular fracture (HCC) Active Problems:   Benign essential HTN   Dementia with behavioral disturbance (HCC)   Seizures (HCC)   SIRS (systemic inflammatory response syndrome) (HCC)   Cellulitis of left hand   Wrist pain  Principal Problem:   Closed left acetabular fracture [facility]) -Orthopedic Surgery following -Recommendation for TDWB LLE and to f/u with Dr. Marcelino Scot in 2-3 weeks to assess healing -continue with analgesics as needed -TOC following. Plan for SNF placement  Active Problems:   SIRS (systemic inflammatory response syndrome) (HCC) ruled out In the setting of trauma and leukocytosis at time of presentation    Cellulitis of left hand ruled out Afebrile, no leukocytosis this AM Was started on empiric abx at time of presentation, will d/c abx Procalcitonin of 0.35 Blood cultures neg ESR elevated. L wrist with swelling noted on imaging. Have started trial of prednisone    Benign essential HTN BP remains stable Continue antihypertensive as tolerated    Dementia with behavioral disturbance (HCC) Off  Depakote ER and Lexapro. Continue to reorient as tolerated.    Seizures ([place]) Continued on Vimpat 100 mg p.o. twice daily.  DVT prophylaxis: Lovenox subq Code Status: Full Family Communication: Pt in room, family not at bedside  Status is: Inpatient  The patient will require care spanning > 2 midnights and should be moved to inpatient because: Altered mental status and Unsafe d/c plan  Dispo: The patient is from: Home              Anticipated d/c is to: Home              Anticipated d/c date is: 2 days              Patient currently is not medically stable to d/c.   Consultants:   Orthopedic Surgery  Procedures:     Antimicrobials: Anti-infectives (From admission, onward)   Start     Dose/Rate Route Frequency Ordered Stop   12/15/19 1600  vancomycin (VANCOREADY) IVPB 750 mg/150 mL  Status:  Discontinued        750 mg 150 mL/hr over 60 Minutes Intravenous Every 24 hours 12/14/19 1926 12/15/19 1004   12/14/19 2200  ceFEPIme (MAXIPIME) 2 g in sodium chloride 0.9 % 100 mL IVPB  Status:  Discontinued        2 g 200 mL/hr over 30 Minutes Intravenous Every 12 hours 12/14/19 1926 12/15/19 1004   12/14/19 1215  ceFEPIme (MAXIPIME) 2 g in sodium chloride 0.9 % 100 mL IVPB        2 g 200 mL/hr over 30 Minutes Intravenous  Once 12/14/19 1206 12/14/19  1530   12/14/19 1215  vancomycin (VANCOCIN) IVPB 1000 mg/200 mL premix        1,000 mg 200 mL/hr over 60 Minutes Intravenous  Once 12/14/19 1206 12/14/19 2058      Subjective: Still complaining of L hand/wrist pain  Objective: Vitals:   12/15/19 0611 12/15/19 1002 12/15/19 1248 12/15/19 2103  BP: 122/77 120/87 130/89 (!) 133/96  Pulse: (!) 108 (!) 102 100 (!) 107  Resp: $Remo'20 16 16 20  'aOnAI$ Temp: 98.2 F (36.8 C) 98.2 F (36.8 C) 98.4 F (36.9 C) 98.6 F (37 C)  TempSrc: Oral Oral Oral Oral  SpO2: 92% 100% 100% 100%  Weight:      Height:        Intake/Output Summary (Last 24 hours) at 12/16/2019 1147 Last data filed at  12/16/2019 1020 Gross per 24 hour  Intake 180 ml  Output 850 ml  Net -670 ml   Filed Weights   12/14/19 2200  Weight: 67.5 kg    Examination: General exam: Awake, laying in bed, in nad Respiratory system: Normal respiratory effort, no wheezing Cardiovascular system: regular rate, s1, s2 Gastrointestinal system: Soft, nondistended, positive BS Central nervous system: CN2-12 grossly intact, strength intact Extremities: Perfused, no clubbing, Lwrist swelling and pain Skin: Normal skin turgor, no notable skin lesions seen Psychiatry: Mood normal // no visual hallucinations   Data Reviewed: I have personally reviewed following labs and imaging studies  CBC: Recent Labs  Lab 12/14/19 1115 12/15/19 0540 12/16/19 0534  WBC 14.1* 9.9 12.2*  NEUTROABS 12.6* 7.9*  --   HGB 13.0 10.7* 10.3*  HCT 40.7 34.8* 33.3*  MCV 81.7 84.9 84.3  PLT 238 173 240   Basic Metabolic Panel: Recent Labs  Lab 12/14/19 1115 12/15/19 0540 12/16/19 0534  NA 138 135 137  K 4.0 4.4 4.1  CL 98 103 103  CO2 $Re'27 24 22  'qkv$ GLUCOSE 110* 93 128*  BUN 23 22 24*  CREATININE 1.21 1.14 0.97  CALCIUM 10.1 8.6* 8.5*   GFR: Estimated Creatinine Clearance: 55.1 mL/min (by C-G formula based on SCr of 0.97 mg/dL). Liver Function Tests: Recent Labs  Lab 12/14/19 1115 12/15/19 0540 12/16/19 0534  AST $Re'25 26 23  'WAZ$ ALT $R'24 16 15  'MT$ ALKPHOS 76 57 54  BILITOT 0.8 1.2 0.6  PROT 8.9* 6.5 6.6  ALBUMIN 3.5 2.4* 2.5*   Recent Labs  Lab 12/14/19 1115  LIPASE 27   No results for input(s): AMMONIA in the last 168 hours. Coagulation Profile: No results for input(s): INR, PROTIME in the last 168 hours. Cardiac Enzymes: No results for input(s): CKTOTAL, CKMB, CKMBINDEX, TROPONINI in the last 168 hours. BNP (last 3 results) No results for input(s): PROBNP in the last 8760 hours. HbA1C: No results for input(s): HGBA1C in the last 72 hours. CBG: Recent Labs  Lab 12/14/19 1125  GLUCAP 98   Lipid Profile: No  results for input(s): CHOL, HDL, LDLCALC, TRIG, CHOLHDL, LDLDIRECT in the last 72 hours. Thyroid Function Tests: No results for input(s): TSH, T4TOTAL, FREET4, T3FREE, THYROIDAB in the last 72 hours. Anemia Panel: No results for input(s): VITAMINB12, FOLATE, FERRITIN, TIBC, IRON, RETICCTPCT in the last 72 hours. Sepsis Labs: Recent Labs  Lab 12/14/19 1115 12/14/19 1500 12/14/19 2040 12/15/19 0540  PROCALCITON  --   --  0.34 0.35  LATICACIDVEN 2.1* 2.0*  --   --     Recent Results (from the past 240 hour(s))  Culture, blood (routine x 2)     Status: None (  Preliminary result)   Collection Time: 12/14/19 12:48 PM   Specimen: BLOOD  Result Value Ref Range Status   Specimen Description   Final    BLOOD RIGHT ARM Performed at [facility] [address], [place], [place] [address]    Special Requests   Final    BOTTLES DRAWN AEROBIC AND ANAEROBIC Blood Culture results may not be optimal due to an inadequate volume of blood received in culture bottles Performed at [facility] [address], [place], [place] [address]    Culture   Final    NO GROWTH < 24 HOURS Performed at [facility] [address], [place], [place] [address]    Report Status PENDING  Incomplete  Culture, blood (routine x 2)     Status: None (Preliminary result)   Collection Time: 12/14/19  1:45 PM   Specimen: BLOOD  Result Value Ref Range Status   Specimen Description   Final    BLOOD LEFT ARM Performed at [facility] [address] [place], [place] [address]    Special Requests   Final    BOTTLES DRAWN AEROBIC AND ANAEROBIC Blood Culture results may not be optimal due to an inadequate volume of blood received in culture bottles Performed at [facility] [address] [place], [place] [address]    Culture   Final    NO GROWTH < 24 HOURS Performed at [facility] [address], [place], [place] [address]     Report Status PENDING  Incomplete  Urine culture     Status: None   Collection Time: 12/14/19  5:00 PM   Specimen: Urine, Random  Result Value Ref Range Status   Specimen Description   Final    URINE, RANDOM Performed at [facility] [address] [place], [place] [address]    Special Requests   Final    NONE Performed at [facility], [place] [address] [place], [place] [address]    Culture   Final    NO GROWTH Performed at [facility], [place] [address] [place], [place] [address]    Report Status 12/16/2019 FINAL  Final  Respiratory Panel by RT PCR (Flu A&B, Covid) - Nasopharyngeal Swab     Status: None   Collection Time: 12/14/19  6:32 PM   Specimen: Nasopharyngeal Swab  Result Value Ref Range Status   SARS Coronavirus 2 by RT PCR NEGATIVE NEGATIVE Final    Comment: (NOTE) SARS-CoV-2 target nucleic acids are NOT DETECTED.  The SARS-CoV-2 RNA is generally detectable in upper respiratoy specimens during the acute phase of infection. The lowest concentration of SARS-CoV-2 viral copies this assay can detect is 131 copies/mL. A negative result does not preclude SARS-Cov-2 infection and should not be used as the sole basis for treatment or other patient management decisions. A negative result may occur with  improper specimen collection/handling, submission of specimen other than nasopharyngeal swab, presence of viral mutation(s) within the areas targeted by this assay, and inadequate number of viral copies (<131 copies/mL). A negative result must be combined with clinical observations, patient history, and epidemiological information. The expected result is Negative.  Fact Sheet for Patients:  PinkCheek.be  Fact Sheet for Healthcare Providers:  GravelBags.it  This test is no t yet approved or cleared by the [place] FDA and  has been authorized for detection and/or  diagnosis of SARS-CoV-2 by FDA under an Emergency Use  Authorization (EUA). This EUA will remain  in effect (meaning this test can be used) for the duration of the COVID-19 declaration under Section 564(b)(1) of the Act, 21 U.S.C. section 360bbb-3(b)(1), unless the authorization is terminated or revoked sooner.     Influenza A by PCR NEGATIVE NEGATIVE Final   Influenza B by PCR NEGATIVE NEGATIVE Final    Comment: (NOTE) The Xpert Xpress SARS-CoV-2/FLU/RSV assay is intended as an aid in  the diagnosis of influenza from Nasopharyngeal swab specimens and  should not be used as a sole basis for treatment. Nasal washings and  aspirates are unacceptable for Xpert Xpress SARS-CoV-2/FLU/RSV  testing.  Fact Sheet for Patients: PinkCheek.be  Fact Sheet for Healthcare Providers: GravelBags.it  This test is not yet approved or cleared by the [place] FDA and  has been authorized for detection and/or diagnosis of SARS-CoV-2 by  FDA under an Emergency Use Authorization (EUA). This EUA will remain  in effect (meaning this test can be used) for the duration of the  Covid-19 declaration under Section 564(b)(1) of the Act, 21  U.S.C. section 360bbb-3(b)(1), unless the authorization is  terminated or revoked. Performed at [facility], [place] [address] [place], [place] [address]      Radiology Studies: DG Chest 1 View  Result Date: 12/14/2019 CLINICAL DATA:  Altered mental status. EXAM: CHEST  1 VIEW COMPARISON:  June 13 21. FINDINGS: Similar cardiac silhouette. No confluent consolidation. No visible pleural effusions or pneumothorax. Advanced bilateral shoulder degenerative change. Multilevel degenerative change of the thoracic spine, suboptimally evaluated without a lateral radiograph. IMPRESSION: No evidence of acute cardiopulmonary disease. Electronically Signed   By: Margaretha Sheffield MD   On: 12/14/2019 12:28    DG Wrist Complete Left  Result Date: 12/14/2019 CLINICAL DATA:  Altered mental status, pain EXAM: LEFT WRIST - COMPLETE 3+ VIEW COMPARISON:  None. FINDINGS: No displaced fracture or dislocation of the left wrist. Assessment of the carpus is limited by contracted, flexed position of the hand. Within this limitation, there is severe radiocarpal and carpal arthrosis with subchondral lucencies. The carpus is normally aligned. Diffuse soft tissue edema about the hand and wrist. IMPRESSION: 1.  No displaced fracture or dislocation of the left wrist 2. Assessment of the carpus is limited by contracted, flexed position of the hand. Within this limitation, there is severe radiocarpal and carpal arthrosis with subchondral lucencies suggestive of erosive arthropathy such as gout. 3.  Diffuse soft tissue edema about the hand and wrist. Electronically Signed   By: Eddie Candle M.D.   On: 12/14/2019 12:37   CT Head Wo Contrast  Result Date: 12/14/2019 CLINICAL DATA:  Delirium. Additional history provided: Weakness, pain with movement. EXAM: CT HEAD WITHOUT CONTRAST TECHNIQUE: Contiguous axial images were obtained from the base of the skull through the vertex without intravenous contrast. COMPARISON:  Prior head CT 08/10/2019.  Brain MRI 02/01/2014. FINDINGS: Brain: Moderate generalized cerebral atrophy. Redemonstrated chronic infarct within the left corona radiata/basal ganglia. Additional redemonstrated small chronic infarcts within the deep gray nuclei. Background advanced ill-defined hypoattenuation within the cerebral white matter is nonspecific, but compatible with chronic small vessel ischemic disease. There is no acute intracranial hemorrhage. No demarcated cortical infarct. No extra-axial fluid collection. No evidence of intracranial mass. No midline shift. Vascular: No hyperdense vessel.  Atherosclerotic calcifications. Skull: Normal. Negative for fracture or focal lesion. Sinuses/Orbits: Visualized orbits  show no acute finding. Chronic medially displaced deformity of the left lamina papyracea. No significant paranasal sinus disease at  the imaged levels. IMPRESSION: No evidence of acute intracranial abnormality. Redemonstrated severe chronic small vessel ischemic disease with multiple chronic small-vessel infarcts. Stable, moderate cerebral atrophy. Electronically Signed   By: Kellie Simmering DO   On: 12/14/2019 14:47   CT ABDOMEN PELVIS W CONTRAST  Result Date: 12/14/2019 CLINICAL DATA:  Abdominal pain EXAM: CT ABDOMEN AND PELVIS WITH CONTRAST TECHNIQUE: Multidetector CT imaging of the abdomen and pelvis was performed using the standard protocol following bolus administration of intravenous contrast. CONTRAST:  120mL OMNIPAQUE IOHEXOL 300 MG/ML  SOLN COMPARISON:  None. FINDINGS: Lower chest: The visualized heart size within normal limits. Coronary artery calcifications are seen. No pericardial fluid/thickening. No hiatal hernia. Streaky atelectasis seen at both lung bases. Hepatobiliary: The liver is normal in density without focal abnormality.The main portal vein is patent. No evidence of calcified gallstones, gallbladder wall thickening or biliary dilatation. Pancreas: Unremarkable. No pancreatic ductal dilatation or surrounding inflammatory changes. Spleen: Normal in size without focal abnormality. Adrenals/Urinary Tract: Both adrenal glands appear normal. The kidneys and collecting system appear normal without evidence of urinary tract calculus or hydronephrosis. Bladder is unremarkable. Stomach/Bowel: The stomach, small bowel, and colon are normal in appearance. No inflammatory changes, wall thickening, or obstructive findings.Moderate stool is present throughout. Vascular/Lymphatic: There are no enlarged mesenteric, retroperitoneal, or pelvic lymph nodes. Scattered aortic atherosclerotic calcifications are seen without aneurysmal dilatation. Again noted is aneurysmal dilatation with partial thrombosis of  bilateral iliac vasculature. Reproductive: The prostate is unremarkable. Other: No evidence of abdominal wall mass or hernia. Musculoskeletal: There is a comminuted mildly displaced fracture seen through the anterior and posterior left acetabulum which involve the wall and column. The patient is status post ORIF with intramedullary nail fixation of the left femur. 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 IMPRESSION: Comminuted mildly displaced fractures through the anterior and posterior left acetabulum. Aortic Atherosclerosis (ICD10-I70.0). 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 Electronically Signed   By: Prudencio Pair M.D.   On: 12/14/2019 17:26   DG Pelvis Comp Min 3V  Result Date: 12/14/2019 CLINICAL DATA:  Acetabular fractures. EXAM: JUDET PELVIS - 3+ VIEW COMPARISON:  CT scan, same date. FINDINGS: Complex comminuted left acetabular fractures are again demonstrated. The humeral head is normally located. Intramedullary gamma nail and dynamic hip screw are in place along with a proximal cerclage wire. The right hip demonstrates severe degenerative changes and mild protrusio. IMPRESSION: Complex comminuted left acetabular fractures. Electronically Signed   By: Marijo Sanes M.D.   On: 12/14/2019 19:41   DG Hand Complete Left  Result Date: 12/14/2019 CLINICAL DATA:  Left hand and wrist pain.  Nonverbal. EXAM: LEFT HAND - COMPLETE 3+ VIEW COMPARISON:  Left wrist radiographs obtained at the same time. FINDINGS: Diffuse swelling of the hand. Mild carpal degenerative changes. Moderate distal radioulnar joint degenerative spur formation. No fracture, dislocation, bone destruction, periosteal reaction or soft tissue gas. IMPRESSION: 1. Diffuse swelling of the hand without fracture. 2. Wrist degenerative changes. Electronically Signed   By: Claudie Revering M.D.   On: 12/14/2019 12:31    Scheduled Meds: . (feeding supplement) PROSource Plus  30 mL Oral Daily  . atorvastatin  80 mg Oral  q1800  . enoxaparin (LOVENOX) injection  40 mg Subcutaneous Q24H  . predniSONE  40 mg Oral Q breakfast  . senna-docusate  1 tablet Oral BID   Continuous Infusions:   LOS: 1 day   Marylu Lund, MD [facility] Pager On Amion  If 7PM-7AM, please contact night-coverage 12/16/2019, 11:47  AM    

## 2019-12-16 NOTE — Plan of Care (Signed)

## 2019-12-16 NOTE — NC FL2 (Signed)
[place] MEDICAID FL2 LEVEL OF CARE SCREENING TOOL     IDENTIFICATION  Patient Name: Johnathan Mitchell. Birthdate: [birth year] Sex: male Admission Date (Current Location): 12/14/2019  [facility] and [place] Number:  Producer, television/film/video and Address:  [facility],  [address], [place] [address]      Provider Number: [id number]  Attending Physician Name and Address:  Jerald Kief, MD  Relative Name and Phone Number:  Renji [place] dtr [phone]    Current Level of Care: Hospital Recommended Level of Care: Skilled Nursing Facility Prior Approval Number:    Date Approved/Denied:   PASRR Number: [id number]  Discharge Plan: SNF    Current Diagnoses: Patient Active Problem List   Diagnosis Date Noted   Cellulitis of left hand 12/15/2019   Wrist pain 12/15/2019   Closed left acetabular fracture (HCC) 12/14/2019   SIRS (systemic inflammatory response syndrome) (HCC) 12/14/2019   Protein-calorie malnutrition (HCC) 05/20/2017   TIA (transient ischemic attack) 05/20/2017   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   Seizures (HCC) 05/19/2017   AKI (acute kidney injury) (HCC) 05/12/2017   Stroke (HCC) 05/11/2017   Femur fracture (HCC) 05/11/2017   Chest pain 02/09/2016   HTN (hypertension) 02/09/2016   Dementia with behavioral disturbance (HCC) 02/09/2016   Chest pain at rest 02/09/2016   Cerebral infarction (HCC) 02/01/2014   Benign essential HTN 02/01/2014    Orientation RESPIRATION BLADDER Height & Weight     Self, Time, Situation  Normal Incontinent Weight: 67.5 kg Height:  5\' 8"  (172.7 cm)  BEHAVIORAL SYMPTOMS/MOOD NEUROLOGICAL BOWEL NUTRITION STATUS      Incontinent Diet (Heart Healthy)  AMBULATORY STATUS COMMUNICATION OF NEEDS Skin   Limited Assist Verbally (non sensical) Normal                       Personal Care Assistance Level of Assistance  Bathing, Feeding, Dressing Bathing Assistance: Limited  assistance Feeding assistance: Limited assistance Dressing Assistance: Limited assistance     Functional Limitations Info  Sight, Hearing, Speech Sight Info: Impaired (eyeglasses) Hearing Info: Adequate Speech Info: Impaired (non sensical)    SPECIAL CARE FACTORS FREQUENCY  PT (By licensed PT), OT (By licensed OT)     PT Frequency: 5x week OT Frequency: 5x week            Contractures Contractures Info: Not present    Additional Factors Info  Code Status, Allergies Code Status Info:  (Full) Allergies Info:  (Penicillins)           Current Medications (12/16/2019):  This is the current hospital active medication list Current Facility-Administered Medications  Medication Dose Route Frequency Provider Last Rate Last Admin   (feeding supplement) PROSource Plus liquid 30 mL  30 mL Oral Daily 12/18/2019, MD   30 mL at 12/16/19 1035   acetaminophen (TYLENOL) tablet 650 mg  650 mg Oral Q6H PRN 12/18/19, MD       Or   acetaminophen (TYLENOL) suppository 650 mg  650 mg Rectal Q6H PRN Bobette Mo, MD       atorvastatin (LIPITOR) tablet 80 mg  80 mg Oral q1800 Bobette Mo, MD   80 mg at 12/15/19 1903   bisacodyl (DULCOLAX) EC tablet 5 mg  5 mg Oral Daily PRN 12/17/19, MD       enoxaparin (LOVENOX) injection 40 mg  40 mg Subcutaneous Q24H Bobette Mo, [facility]  hydrocerin (EUCERIN) cream   Topical BID PRN Bobette Mo, MD       HYDROmorphone (DILAUDID) injection 0.5 mg  0.5 mg Intravenous Q2H PRN Bobette Mo, MD   0.5 mg at 12/15/19 0012   ondansetron [facility]) tablet 4 mg  4 mg Oral Q6H PRN Bobette Mo, MD       Or   ondansetron [facility]) injection 4 mg  4 mg Intravenous Q6H PRN Bobette Mo, MD       oxyCODONE (Oxy IR/ROXICODONE) immediate release tablet 2.5 mg  2.5 mg Oral Q4H PRN Bobette Mo, MD   2.5 mg at 12/15/19 1335   predniSONE (DELTASONE) tablet 40 mg  40 mg Oral Q breakfast  Jerald Kief, MD   40 mg at 12/15/19 1335   senna-docusate (Senokot-S) tablet 1 tablet  1 tablet Oral BID Bobette Mo, MD   1 tablet at 12/16/19 1032     Discharge Medications: Please see discharge summary for a list of discharge medications.  Relevant Imaging Results:  Relevant Lab Results:   Additional Information SS# [id number]  Lanier Clam, RN

## 2019-12-16 NOTE — [place] FL2 (Signed)
[place] MEDICAID FL2 LEVEL OF CARE SCREENING TOOL     IDENTIFICATION  Patient Name: Johnathan Mitchell. Birthdate: 07/06/1934 Sex: male Admission Date (Current Location): 12/14/2019  [facility] and [place] Number:  Producer, television/film/video and Address:  [facility],  [address], [place] [address]      Provider Number: [id number]  Attending Physician Name and Address:  Jerald Kief, MD  Relative Name and Phone Number:  Avi Kerschner dtr [phone]    Current Level of Care: Hospital Recommended Level of Care: Skilled Nursing Facility Prior Approval Number:    Date Approved/Denied:   PASRR Number:    Discharge Plan: SNF    Current Diagnoses: Patient Active Problem List   Diagnosis Date Noted  . Cellulitis of left hand 12/15/2019  . Wrist pain 12/15/2019  . Closed left acetabular fracture (HCC) 12/14/2019  . SIRS (systemic inflammatory response syndrome) (HCC) 12/14/2019  . Protein-calorie malnutrition (HCC) 05/20/2017  . TIA (transient ischemic attack) 05/20/2017  . 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 Seizures (HCC) 05/19/2017  . AKI (acute kidney injury) (HCC) 05/12/2017  . Stroke (HCC) 05/11/2017  . Femur fracture (HCC) 05/11/2017  . Chest pain 02/09/2016  . HTN (hypertension) 02/09/2016  . Dementia with behavioral disturbance (HCC) 02/09/2016  . Chest pain at rest 02/09/2016  . Cerebral infarction (HCC) 02/01/2014  . Benign essential HTN 02/01/2014    Orientation RESPIRATION BLADDER Height & Weight     Self, Time, Situation  Normal Incontinent Weight: 67.5 kg Height:  5\' 8"  (172.7 cm)  BEHAVIORAL SYMPTOMS/MOOD NEUROLOGICAL BOWEL NUTRITION STATUS      Incontinent Diet (Heart Healthy)  AMBULATORY STATUS COMMUNICATION OF NEEDS Skin   Limited Assist Verbally (non sensical) Normal                       Personal Care Assistance Level of Assistance  Bathing, Feeding, Dressing Bathing Assistance: Limited assistance Feeding  assistance: Limited assistance Dressing Assistance: Limited assistance     Functional Limitations Info  Sight, Hearing, Speech Sight Info: Impaired (eyeglasses) Hearing Info: Adequate Speech Info: Impaired (non sensical)    SPECIAL CARE FACTORS FREQUENCY  PT (By licensed PT), OT (By licensed OT)     PT Frequency: 5x week OT Frequency: 5x week            Contractures Contractures Info: Not present    Additional Factors Info  Code Status, Allergies Code Status Info:  (Full) Allergies Info:  (Penicillins)           Current Medications (12/16/2019):  This is the current hospital active medication list Current Facility-Administered Medications  Medication Dose Route Frequency Provider Last Rate Last Admin  . (feeding supplement) PROSource Plus liquid 30 mL  30 mL Oral Daily 12/18/2019, MD   30 mL at 12/16/19 1035  . acetaminophen (TYLENOL) tablet 650 mg  650 mg Oral Q6H PRN 12/18/19, MD       Or  . acetaminophen (TYLENOL) suppository 650 mg  650 mg Rectal Q6H PRN Bobette Mo, MD      . atorvastatin (LIPITOR) tablet 80 mg  80 mg Oral q1800 Bobette Mo, MD   80 mg at 12/15/19 1903  . bisacodyl (DULCOLAX) EC tablet 5 mg  5 mg Oral Daily PRN 12/17/19, MD      . enoxaparin (LOVENOX) injection 40 mg  40 mg Subcutaneous Q24H Bobette Mo, RPH      .  hydrocerin (EUCERIN) cream   Topical BID PRN Bobette Mo, MD      . HYDROmorphone (DILAUDID) injection 0.5 mg  0.5 mg Intravenous Q2H PRN Bobette Mo, MD   0.5 mg at 12/15/19 0012  . ondansetron (ZOFRAN) tablet 4 mg  4 mg Oral Q6H PRN Bobette Mo, MD       Or  . ondansetron [facility]) injection 4 mg  4 mg Intravenous Q6H PRN Bobette Mo, MD      . oxyCODONE (Oxy IR/ROXICODONE) immediate release tablet 2.5 mg  2.5 mg Oral Q4H PRN Bobette Mo, MD   2.5 mg at 12/15/19 1335  . predniSONE (DELTASONE) tablet 40 mg  40 mg Oral Q breakfast Jerald Kief, MD    40 mg at 12/15/19 1335  . senna-docusate (Senokot-S) tablet 1 tablet  1 tablet Oral BID Bobette Mo, MD   1 tablet at 12/16/19 1032     Discharge Medications: Please see discharge summary for a list of discharge medications.  Relevant Imaging Results:  Relevant Lab Results:   Additional Information SS# [id number]  Lanier Clam, RN

## 2019-12-16 NOTE — Care Management (Deleted)
  RE:___[name] ________________________________________  Date of Birth:____November 23, 1936________________________________  Date:____11/18/2021______________________________________  To:Whom It May Concern:  Please be advised that the above-named patient has a primary diagnosis    of dementia which supersedes any psychiatric diagnosis.       ________________________________________________ MD Signature   __________________________ Date

## 2019-12-16 NOTE — Care Management (Deleted)
  RE:___James C. Griffith ________________________________________  Date of Birth:____5/26/1936________________________________  Date:____11/18/2021______________________________________  To:Whom It May Concern:  Please be advised that the above-named patient has a primary diagnosis    of dementia which supersedes any psychiatric diagnosis.       ________________________________________________ MD Signature   __________________________ Date 

## 2019-12-17 DIAGNOSIS — F0151 Vascular dementia with behavioral disturbance: Secondary | ICD-10-CM | POA: Diagnosis not present

## 2019-12-17 DIAGNOSIS — I10 Essential (primary) hypertension: Secondary | ICD-10-CM

## 2019-12-17 DIAGNOSIS — L03114 Cellulitis of left upper limb: Secondary | ICD-10-CM | POA: Diagnosis not present

## 2019-12-17 LAB — SARS CORONAVIRUS 2 BY RT PCR (HOSPITAL ORDER, PERFORMED IN ~~LOC~~ HOSPITAL LAB): SARS Coronavirus 2: NEGATIVE

## 2019-12-17 MED ORDER — METHYLPREDNISOLONE SODIUM SUCC 40 MG IJ SOLR
40.0000 mg | Freq: Once | INTRAMUSCULAR | Status: AC
  Administered 2019-12-17: 40 mg via INTRAVENOUS
  Filled 2019-12-17: qty 1

## 2019-12-17 NOTE — TOC Initial Note (Addendum)
Transition of Care (TOC) - Initial/Assessment Note    Patient Details  Name: Johnathan Mitchell. MRN: [id number] Date of Birth: 29-Mar-1934  [facility]) CM/SW Contact:    Lanier Clam, RN Phone Number: 12/17/2019, 2:00 PM  Clinical Narrative: Patient w/dementia;dtr Asher Muir agree to SNF-[facility] checking if able to accept over weekend. 4p-[facility] bed available Monday-dtr agreed.                  Expected Discharge Plan: Skilled Nursing Facility Barriers to Discharge: Continued Medical Work up   Patient Goals and CMS Choice Patient states their goals for this hospitalization and ongoing recovery are:: go to rehab CMS Medicare.gov Compare Post Acute Care list provided to:: Patient Represenative (must comment) (Dtr-Jamie) Choice offered to / list presented to : Adult Children  Expected Discharge Plan and Services Expected Discharge Plan: Skilled Nursing Facility   Discharge Planning Services: CM Consult Post Acute Care Choice: Skilled Nursing Facility Living arrangements for the past 2 months: Single Family Home                                      Prior Living Arrangements/Services Living arrangements for the past 2 months: Single Family Home Lives with:: Adult Children Patient language and need for interpreter reviewed:: Yes Do you feel safe going back to the place where you live?: Yes      Need for Family Participation in Patient Care: No (Comment) Care giver support system in place?: Yes (comment)   Criminal Activity/Legal Involvement Pertinent to Current Situation/Hospitalization: No - Comment as needed  Activities of Daily Living Home Assistive Devices/Equipment: Wheelchair, Hospital bed, Eyeglasses ADL Screening (condition at time of admission) Patient's cognitive ability adequate to safely complete daily activities?: Yes Is the patient deaf or have difficulty hearing?: No Does the patient have difficulty seeing, even  when wearing glasses/contacts?: No Does the patient have difficulty concentrating, remembering, or making decisions?: Yes Patient able to express need for assistance with ADLs?: No Does the patient have difficulty dressing or bathing?: Yes Independently performs ADLs?: No Communication: Independent Dressing (OT): Needs assistance Is this a change from baseline?: Pre-admission baseline Grooming: Needs assistance Is this a change from baseline?: Pre-admission baseline Feeding: Needs assistance Is this a change from baseline?: Pre-admission baseline Bathing: Needs assistance Is this a change from baseline?: Pre-admission baseline Toileting: Needs assistance Is this a change from baseline?: Pre-admission baseline In/Out Bed: Needs assistance Is this a change from baseline?: Pre-admission baseline Walks in Home: Dependent Is this a change from baseline?: Pre-admission baseline Does the patient have difficulty walking or climbing stairs?: Yes Weakness of Legs: Both Weakness of Arms/Hands: Both (both arms weak from stroke)  Permission Sought/Granted Permission sought to share information with : Case Manager Permission granted to share information with : Yes, Verbal Permission Granted  Share Information with NAME: Case manager     Permission granted to share info w Relationship: jamie dtr [phone]     Emotional Assessment Appearance:: Appears stated age Attitude/Demeanor/Rapport: Gracious Affect (typically observed): Accepting Orientation: : Oriented to Self Alcohol / Substance Use: Not Applicable Psych Involvement: No (comment)  Admission diagnosis:  Hip pain [M25.559] Closed left acetabular fracture (HCC) [S32.402A] Wrist pain [M25.539] Patient Active Problem List   Diagnosis Date Noted  . Cellulitis of left hand 12/15/2019  . Wrist pain 12/15/2019  . Closed left acetabular fracture (HCC) 12/14/2019  .  SIRS (systemic inflammatory response syndrome) (HCC) 12/14/2019  .  Protein-calorie malnutrition (HCC) 05/20/2017  . TIA (transient ischemic attack) 05/20/2017  . 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 Seizures (HCC) 05/19/2017  . AKI (acute kidney injury) (HCC) 05/12/2017  . Stroke (HCC) 05/11/2017  . Femur fracture (HCC) 05/11/2017  . Chest pain 02/09/2016  . HTN (hypertension) 02/09/2016  . Dementia with behavioral disturbance (HCC) 02/09/2016  . Chest pain at rest 02/09/2016  . Cerebral infarction (HCC) 02/01/2014  . Benign essential HTN 02/01/2014   PCP:  Administration, [facility]:   [facility] - [place], [place] - [address] [phone] [facility] [address] Grand[address] [place] [address] Phone: [phone] Fax: [phone]     Social Determinants of Health (SDOH) Interventions    Readmission Risk Interventions No flowsheet data found.

## 2019-12-17 NOTE — Progress Notes (Signed)
Physical Therapy Treatment Patient Details Name: Johnathan Mitchell. MRN: [id number] DOB: 26-Jan-1935 Today's Date: 12/17/2019    History of Present Illness Patient is 85 y.o. male with PMH significant for asthma, dementia, HTN, seizures, embolic CVA with residual left-sided hemiparesis. Pt admitted yesterday with a couple week hx/o hand swelling/pain. He also c/o some groin pain so a pelvic x-ray was done. This showed a left acetabular fx and orthopedic surgery was consulted, suspect gout in Lt wrist. There's no hx/o fall.    PT Comments    Patient making slow progress and remains limited by pain and cognitive impairments at baseline. He was able to follow commands more today ~50% of time to complete supine to sit and Max Assist provided to bring trunk up and LEs off EOB. Pt unable to hold seated balance on his own due to pain and required Max posterior support. He will benefit from skilled PT interventions to progress mobility and as able in acute setting and at SNF level for follow up.     Follow Up Recommendations  SNF;Supervision/Assistance - 24 hour     Equipment Recommendations  None recommended by PT;Other (comment) (mechanical lift)    Recommendations for Other Services OT consult     Precautions / Restrictions Precautions Precautions: Fall Restrictions Weight Bearing Restrictions: Yes LLE Weight Bearing: Touchdown weight bearing    Mobility  Bed Mobility Overal bed mobility: Needs Assistance Bed Mobility: Supine to Sit;Sit to Supine;Rolling Rolling: Max assist;+2 for physical assistance   Supine to sit: Max assist;HOB elevated Sit to supine: Total assist;+2 for physical assistance;+2 for safety/equipment   General bed mobility comments: Pt slightly more coopoerative with following commands to sit up EOB today. MAX assist continues to be required to sit EOB. pt followed commands to place Rt hand on bed rail today and to pull to assit with trunk, small steps performed to  move bil LE's together to EOB. He obtained full sitting this date but required MAX assist to keep trunk upright due to hip pain. Total assist +2 to return to supine and boost in bed. Max assist provided for rolling to change bed linens. pt requried assist to reach Rt UE and flex Rt knee.  Transfers                    Ambulation/Gait                 Stairs             Wheelchair Mobility    Modified Rankin (Stroke Patients Only)       Balance Overall balance assessment: Needs assistance Sitting-balance support: Feet supported Sitting balance-Leahy Scale: Zero                                      Cognition Arousal/Alertness: Awake/alert Behavior During Therapy: WFL for tasks assessed/performed Overall Cognitive Status: History of cognitive impairments - at baseline                                        Exercises      General Comments        Pertinent Vitals/Pain Pain Assessment: Faces Faces Pain Scale: Hurts little more Pain Location: Lt wrist and Lt LE Pain Descriptors / Indicators: Grimacing;Guarding Pain Intervention(s): Limited activity within patient's  tolerance;Monitored during session;Repositioned    Home Living                      Prior Function            PT Goals (current goals can now be found in the care plan section) Acute Rehab PT Goals Patient Stated Goal: none stated by pt, daughter wants him to be able to transfer PT Goal Formulation: With patient Time For Goal Achievement: 12/29/19 Potential to Achieve Goals: Fair Progress towards PT goals: Progressing toward goals    Frequency    Min 2X/week      PT Plan Current plan remains appropriate    Co-evaluation              AM-PAC PT "6 Clicks" Mobility   Outcome Measure  Help needed turning from your back to your side while in a flat bed without using bedrails?: A Lot Help needed moving from lying on your back to  sitting on the side of a flat bed without using bedrails?: Total Help needed moving to and from a bed to a chair (including a wheelchair)?: Total Help needed standing up from a chair using your arms (e.g., wheelchair or bedside chair)?: Total Help needed to walk in hospital room?: Total Help needed climbing 3-5 steps with a railing? : Total 6 Click Score: 7    End of Session   Activity Tolerance: Patient limited by pain Patient left: in bed;with call bell/phone within reach;with nursing/sitter in room Nurse Communication: Mobility status PT Visit Diagnosis: Other abnormalities of gait and mobility (R26.89);Muscle weakness (generalized) (M62.81);Pain Pain - Right/Left: Left Pain - part of body: Hand;Hip     Time: 1610-9604 PT Time Calculation (min) (ACUTE ONLY): 23 min  Charges:  $Therapeutic Activity: 23-37 mins                    Wynn Maudlin, DPT Acute Rehabilitation Services  Office [phone] Pager [phone]  12/17/2019 2:02 PM

## 2019-12-17 NOTE — Plan of Care (Signed)

## 2019-12-17 NOTE — Progress Notes (Signed)
Pt refused to have vital signs taken or drainage bag for condom catheter emptied. He screamed at staff to get out of his room and leave him alone. Pt now resting in no distress. Will inform staff to try again later when is daylight when pt is more willing to cooperate.

## 2019-12-17 NOTE — Progress Notes (Signed)
PROGRESS NOTE    Johnathan Mitchell.  FUX:[id number] DOB: [birth year] DOA: 12/14/2019 PCP: [facility]    Brief Narrative:  85 y.o. male with medical history significant of asthma, dementia, hypertension, seizures, history of other known embolic CVA with residual left-sided hemiparesis who is brought to the emergency department via EMS due to due to left hand swelling/tenderness for the past few weeks associated with weakness, decreased oral intake and worsening confusion.  He was seen at [facility] where he had a negative x-ray and was put in a brace.  He has also been having lower abdominal and back pain recently.   In the ED, L hand xray was notable for diffuse swelling without fracture or dislocation, bone destruction, or periostel reaction. Pelvic xray was notable for comminuted L acetabular fracture  Assessment & Plan:   Principal Problem:   Closed left acetabular fracture (HCC) Active Problems:   Benign essential HTN   Dementia with behavioral disturbance (HCC)   Seizures (HCC)   SIRS (systemic inflammatory response syndrome) (HCC)   Cellulitis of left hand   Wrist pain  Principal Problem:   Closed left acetabular fracture [facility]) -Orthopedic Surgery following -Recommendation for TDWB LLE and to f/u with Dr. Marcelino Scot in 2-3 weeks to assess healing -continue with analgesics as needed -TOC following. Anticipate SNF placement  Active Problems:   SIRS (systemic inflammatory response syndrome) (HCC) ruled out In the setting of trauma and leukocytosis at time of presentation Tachycardia likely related to acute pain with mild leukocytosis likely secondary to acute fracture. Currently leukocytosis is likely related to below steroids    Cellulitis of left hand ruled out Was started on empiric abx at time of presentation, will d/c abx Procalcitonin of 0.35 Blood cultures neg ESR elevated. L wrist with swelling noted on imaging. Have started trial of steroids, seems to be  improving    Benign essential HTN BP remains stable Continue antihypertensive as tolerated    Dementia with behavioral disturbance (HCC) Off Depakote ER and Lexapro. Continue to reorient as tolerated.    Seizures ([place]) Continued on Vimpat 100 mg p.o. twice daily.  DVT prophylaxis: Lovenox subq Code Status: Full Family Communication: Pt in room, family not at bedside  Status is: Inpatient  The patient will require care spanning > 2 midnights and should be moved to inpatient because: Altered mental status and Unsafe d/c plan  Dispo: The patient is from: Home              Anticipated d/c is to: Home              Anticipated d/c date is: 2 days              Patient currently is not medically stable to d/c.   Consultants:   Orthopedic Surgery  Procedures:     Antimicrobials: Anti-infectives (From admission, onward)   Start     Dose/Rate Route Frequency Ordered Stop   12/15/19 1600  vancomycin (VANCOREADY) IVPB 750 mg/150 mL  Status:  Discontinued        750 mg 150 mL/hr over 60 Minutes Intravenous Every 24 hours 12/14/19 1926 12/15/19 1004   12/14/19 2200  ceFEPIme (MAXIPIME) 2 g in sodium chloride 0.9 % 100 mL IVPB  Status:  Discontinued        2 g 200 mL/hr over 30 Minutes Intravenous Every 12 hours 12/14/19 1926 12/15/19 1004   12/14/19 1215  ceFEPIme (MAXIPIME) 2 g in sodium chloride 0.9 % 100 mL IVPB  2 g 200 mL/hr over 30 Minutes Intravenous  Once 12/14/19 1206 12/14/19 1530   12/14/19 1215  vancomycin (VANCOCIN) IVPB 1000 mg/200 mL premix        1,000 mg 200 mL/hr over 60 Minutes Intravenous  Once 12/14/19 1206 12/14/19 2058      Subjective: Chronically confused this AM, reports feeling angry about wanting "him over here," referring to pt's RN  Objective: Vitals:   12/15/19 2103 12/16/19 1100 12/16/19 2124 12/17/19 1417  BP: (!) 133/96 123/83 (!) 143/94 (!) 154/105  Pulse: (!) 107 77 75 74  Resp: _0 Temp: 98.6 F (37 C) 100.1 F  (37.8 C) 97.6 F (36.4 C) 97.8 F (36.6 C)  TempSrc: Oral Oral Oral   SpO2: 100% 100% 100% 100%  Weight:      Height:        Intake/Output Summary (Last 24 hours) at 12/17/2019 1423 Last data filed at 12/17/2019 1304 Gross per 24 hour  Intake 354 ml  Output 200 ml  Net 154 ml   Filed Weights   12/14/19 2200  Weight: 67.5 kg    Examination: General exam: Conversant, in no acute distress Respiratory system: normal chest rise, clear, no audible wheezing Cardiovascular system: regular rhythm, s1-s2 Gastrointestinal system: Nondistended, nontender, pos BS Central nervous system: No seizures, no tremors Extremities: No cyanosis, no joint deformities Skin: No rashes, no pallor Psychiatry: Difficult to assess given confusion   Data Reviewed: I have personally reviewed following labs and imaging studies  CBC: Recent Labs  Lab 12/14/19 1115 12/15/19 0540 12/16/19 0534  WBC 14.1* 9.9 12.2*  NEUTROABS 12.6* 7.9*  --   HGB 13.0 10.7* 10.3*  HCT 40.7 34.8* 33.3*  MCV 81.7 84.9 84.3  PLT 238 173 270   Basic Metabolic Panel: Recent Labs  Lab 12/14/19 1115 12/15/19 0540 12/16/19 0534  NA 138 135 137  K 4.0 4.4 4.1  CL 98 103 103  CO2 _1 GLUCOSE 110* 93 128*  BUN 23 22 24*  CREATININE 1.21 1.14 0.97  CALCIUM 10.1 8.6* 8.5*   GFR: Estimated Creatinine Clearance: 55.1 mL/min (by C-G formula based on SCr of 0.97 mg/dL). Liver Function Tests: Recent Labs  Lab 12/14/19 1115 12/15/19 0540 12/16/19 0534  AST _2 ALT _3 ALKPHOS 76 57 54  BILITOT 0.8 1.2 0.6  PROT 8.9* 6.5 6.6  ALBUMIN 3.5 2.4* 2.5*   Recent Labs  Lab 12/14/19 1115  LIPASE 27   No results for input(s): AMMONIA in the last 168 hours. Coagulation Profile: No results for input(s): INR, PROTIME in the last 168 hours. Cardiac Enzymes: No results for input(s): CKTOTAL, CKMB, CKMBINDEX, TROPONINI in the last 168 hours. BNP (last 3 results) No results for input(s): PROBNP in  the last 8760 hours. HbA1C: No results for input(s): HGBA1C in the last 72 hours. CBG: Recent Labs  Lab 12/14/19 1125  GLUCAP 98   Lipid Profile: No results for input(s): CHOL, HDL, LDLCALC, TRIG, CHOLHDL, LDLDIRECT in the last 72 hours. Thyroid Function Tests: No results for input(s): TSH, T4TOTAL, FREET4, T3FREE, THYROIDAB in the last 72 hours. Anemia Panel: No results for input(s): VITAMINB12, FOLATE, FERRITIN, TIBC, IRON, RETICCTPCT in the last 72 hours. Sepsis Labs: Recent Labs  Lab 12/14/19 1115 12/14/19 1500 12/14/19 2040 12/15/19 0540  PROCALCITON  --   --  0.34 0.35  LATICACIDVEN 2.1* 2.0*  --   --     Recent Results (from the  past 240 hour(s))  Culture, blood (routine x 2)     Status: None (Preliminary result)   Collection Time: 12/14/19 12:48 PM   Specimen: BLOOD  Result Value Ref Range Status   Specimen Description   Final    BLOOD RIGHT ARM Performed at [facility] [address], [place], [place] [address]    Special Requests   Final    BOTTLES DRAWN AEROBIC AND ANAEROBIC Blood Culture results may not be optimal due to an inadequate volume of blood received in culture bottles Performed at [facility] [address] [place], [place] [address]    Culture   Final    NO GROWTH 3 DAYS Performed at [facility], [place] [address] [place], [place] [address]    Report Status PENDING  Incomplete  Culture, blood (routine x 2)     Status: None (Preliminary result)   Collection Time: 12/14/19  1:45 PM   Specimen: BLOOD  Result Value Ref Range Status   Specimen Description   Final    BLOOD LEFT ARM Performed at [facility] [address], [place], [place] [address]    Special Requests   Final    BOTTLES DRAWN AEROBIC AND ANAEROBIC Blood Culture results may not be optimal due to an inadequate volume of blood received in culture bottles Performed at [facility] [address] [place], [place] [address]    Culture   Final    NO GROWTH 3 DAYS Performed at [facility], [place] [address], [place], [place] [address]    Report Status PENDING  Incomplete  Urine culture     Status: None   Collection Time: 12/14/19  5:00 PM   Specimen: Urine, Random  Result Value Ref Range Status   Specimen Description   Final    URINE, RANDOM Performed at [facility] [address], [place], [place] [address]    Special Requests   Final    NONE Performed at [facility], [place] [address], [place], [place] [address]    Culture   Final    NO GROWTH Performed at [facility], [place] [address], [place], [place] [address]    Report Status 12/16/2019 FINAL  Final  Respiratory Panel by RT PCR (Flu A&B, Covid) - Nasopharyngeal Swab     Status: None   Collection Time: 12/14/19  6:32 PM   Specimen: Nasopharyngeal Swab  Result Value Ref Range Status   SARS Coronavirus 2 by RT PCR NEGATIVE NEGATIVE Final    Comment: (NOTE) SARS-CoV-2 target nucleic acids are NOT DETECTED.  The SARS-CoV-2 RNA is generally detectable in upper respiratoy specimens during the acute phase of infection. The lowest concentration of SARS-CoV-2 viral copies this assay can detect is 131 copies/mL. A negative result does not preclude SARS-Cov-2 infection and should not be used as the sole basis for treatment or other patient management decisions. A negative result may occur with  improper specimen collection/handling, submission of specimen other than nasopharyngeal swab, presence of viral mutation(s) within the areas targeted by this assay, and inadequate number of viral copies (<131 copies/mL). A negative result must be combined with clinical observations, patient history, and epidemiological information. The expected result is Negative.  Fact Sheet for Patients:  PinkCheek.be  Fact Sheet for Healthcare Providers:   GravelBags.it  This test is no t yet approved or cleared by the [place] FDA and  has been  authorized for detection and/or diagnosis of SARS-CoV-2 by FDA under an Emergency Use Authorization (EUA). This EUA will remain  in effect (meaning this test can be used) for the duration of the COVID-19 declaration under Section 564(b)(1) of the Act, 21 U.S.C. section 360bbb-3(b)(1), unless the authorization is terminated or revoked sooner.     Influenza A by PCR NEGATIVE NEGATIVE Final   Influenza B by PCR NEGATIVE NEGATIVE Final    Comment: (NOTE) The Xpert Xpress SARS-CoV-2/FLU/RSV assay is intended as an aid in  the diagnosis of influenza from Nasopharyngeal swab specimens and  should not be used as a sole basis for treatment. Nasal washings and  aspirates are unacceptable for Xpert Xpress SARS-CoV-2/FLU/RSV  testing.  Fact Sheet for Patients: PinkCheek.be  Fact Sheet for Healthcare Providers: GravelBags.it  This test is not yet approved or cleared by the [place] FDA and  has been authorized for detection and/or diagnosis of SARS-CoV-2 by  FDA under an Emergency Use Authorization (EUA). This EUA will remain  in effect (meaning this test can be used) for the duration of the  Covid-19 declaration under Section 564(b)(1) of the Act, 21  U.S.C. section 360bbb-3(b)(1), unless the authorization is  terminated or revoked. Performed at [facility], [place] [address], [place], [place] [address]      Radiology Studies: No results found.  Scheduled Meds: . (feeding supplement) PROSource Plus  30 mL Oral Daily  . atorvastatin  80 mg Oral q1800  . enoxaparin (LOVENOX) injection  40 mg Subcutaneous Q24H  . senna-docusate  1 tablet Oral BID   Continuous Infusions:   LOS: 2 days   Marylu Lund, MD [facility] Pager On Amion  If 7PM-7AM, please contact  night-coverage 12/17/2019, 2:23 PM

## 2019-12-17 NOTE — Care Management Important Message (Signed)
Important Message  Patient Details IM Letter given to the Patient. Name: Johnathan Mitchell. MRN: [id number] Date of Birth: [birth year]   Medicare Important Message Given:  Yes     Caren Macadam 12/17/2019, 11:39 AM

## 2019-12-17 NOTE — TOC Progression Note (Signed)
Transition of Care (TOC) - Progression Note    Patient Details  Name: Johnathan Mitchell. MRN: [id number] Date of Birth: 05/03/1934  Transition of Care [facility]) CM/SW Contact  Abdi Husak, Olegario Messier, RN Phone Number: 12/17/2019, 1:55 PM  Clinical Narrative: 1. 1.7 mi [facility], [facility] [address] [place], [place] [address] [phone] Overall rating Average 2. 2 mi [facility] at the [facility] [address] [place], [place] [address] [phone] Overall rating Below average 3. 2.1 mi [address], [place] [address] [place], [place] [address] [phone] Overall rating Much below average 4. 2.2 mi [facility] [address] [place], [place] [address] [phone] Overall rating Much above average 5. 2.2 mi [facility] and [facility] [address] [place], [place] [address] [phone] Overall rating Much below average 6. 2.7 mi [facility] [address] [place], [place] [address] [phone] Overall rating Much above average 7. 3.2 mi [facility] and [facility] [address] [place], [place] [address] [phone] Overall rating Average 8. 3.3 mi [facility] [address] [place], [place] [address] [phone] Overall rating Much below average 9. 3.5 mi [facility] [address] [place], [place] [address] [phone] Overall rating Below average 10. 4.3 mi [facility] [address] [place], [place] [address] [phone] Overall rating Below average 11. 4.7 mi [facility] [employer] [employer] [address] [place], [place] [address] [phone] Overall rating Much above average 12. 5.2 mi [facility] [address] [place], [place] [address] [phone] Overall  rating Much above average 13. 6.2 mi [facility] [address] [place], [place] [address] [phone] Overall rating Average 14. 8.1 mi [facility] [address] [place], [place] [address] [phone] Overall rating Above average 15. 8.1 mi [facility] [address] [place], [place] [address] [phone] Overall rating Much below average 16. 9.7 mi [facility] [address] [place], [place] [address] [phone] Overall rating Average 17. 10 mi [facility] [address] [place], [place] [address] [phone] Overall rating Much above average 18. 11.4 mi [facility] [address] [place], [place] [address] [phone] Overall rating Much above average 19. 13.5 [facility] [address] [place], [place] [address] [phone] Overall rating Much below average 20. 13.5 mi [facility] [address] [place], [place] [address] [phone] Overall rating Much below average 21. 14.5 mi [facility] [address] [place], [place] [address] [phone] Overall rating Much below average 22. 14.9 mi [facility] [employer] [facility] [address] [place], [place] [address] [phone] Overall rating Much below average 23. 15.1 mi [facility] at [facility] [address] [place], [place] [address] [phone] Overall rating Much below average 24. 15.3 mi [facility] and [facility] [address] [place], [place] [address] [phone] Overall rating Below average 25. 16 mi [facility] [address] [place], [place] [address] [phone] Overall rating Much above average 26. 16.2 mi [facility] [address] [place], [place] [address] [phone] Overall rating Much above average 27. 16.6 mi [facility] [address],  [place] [address] [phone] Overall rating Below average 28. 17.2 mi [facility] [address] [place], [place] [address] [phone] Overall rating Average 29. 18.9 [facility] [address] [place], [place] [address] [phone] Overall rating Much below average 30. 19.4 mi [address] [employer] [facility], [place] [address] [phone] Overall rating Much above average 31. 20.6 mi [facility] [address] [place], [place] [address] [phone] Overall rating Much below average 32. 20.7 mi [facility] and [facility] [address] [place], [place] [address] [phone] Overall rating Below average 33. 20.9 [facility] [address] [place], [place] [address] [phone] Overall rating Below average 34. 21.1 [facility] [address] [place], [place] [address] [phone] Overall rating Much above average 35. 22 mi [facility] [address] [place], [place] [address] [phone] Overall rating Below average 36. 22.4 mi [address] [place], [place] [address] [phone] Overall rating Average 37. 22.7 mi [facility] [address] [place], [place] [address] [phone] Overall rating Above average 38. 22.8 [address] [place], [place] [address] [phone] Overall rating Much above average 39. 22.9 [facility] [address] [place], [place] [address] [phone] Overall rating Much below average 40. 23 mi [facility] [address] [place], [place] [address] [phone] Overall rating Much below average 41. 24.4 mi [facility] and [facility] [address] [place], [place] [address] [phone] Overall rating Below average 42. 24.6 [facility] [address] [place], [place] [address] [phone] Overall rating Much below average 43. 24.6 mi [facility] [place] [facility] [address] [place], [place] [address] [phone] Overall rating Below average 44. 24.9 mi [facility] [address] [place], [place] [address] [phone] Overall rating Below average To explore and download nursing home data,visit the data catalog on CMS.gov           Expected Discharge Plan and Services                                                 Social Determinants of Health (SDOH) Interventions    Readmission Risk Interventions No flowsheet data found.

## 2019-12-18 DIAGNOSIS — I10 Essential (primary) hypertension: Secondary | ICD-10-CM | POA: Diagnosis not present

## 2019-12-18 DIAGNOSIS — F0151 Vascular dementia with behavioral disturbance: Secondary | ICD-10-CM | POA: Diagnosis not present

## 2019-12-18 LAB — COMPREHENSIVE METABOLIC PANEL
ALT: 14 U/L (ref 0–44)
AST: 16 U/L (ref 15–41)
Albumin: 2.5 g/dL — ABNORMAL LOW (ref 3.5–5.0)
Alkaline Phosphatase: 57 U/L (ref 38–126)
Anion gap: 10 (ref 5–15)
BUN: 18 mg/dL (ref 8–23)
CO2: 23 mmol/L (ref 22–32)
Calcium: 8.6 mg/dL — ABNORMAL LOW (ref 8.9–10.3)
Chloride: 102 mmol/L (ref 98–111)
Creatinine, Ser: 0.63 mg/dL (ref 0.61–1.24)
GFR, Estimated: >60 mL/min (ref >60)
Glucose, Bld: 92 mg/dL (ref 70–99)
Potassium: 3.8 mmol/L (ref 3.5–5.1)
Sodium: 135 mmol/L (ref 135–145)
Total Bilirubin: 0.4 mg/dL (ref 0.3–1.2)
Total Protein: 6.4 g/dL — ABNORMAL LOW (ref 6.5–8.1)

## 2019-12-18 LAB — CBC
HCT: 33.6 % — ABNORMAL LOW (ref 39.0–52.0)
Hemoglobin: 10.5 g/dL — ABNORMAL LOW (ref 13.0–17.0)
MCH: 25.9 pg — ABNORMAL LOW (ref 26.0–34.0)
MCHC: 31.3 g/dL (ref 30.0–36.0)
MCV: 83 fL (ref 80.0–100.0)
Platelets: 186 10*3/uL (ref 150–400)
RBC: 4.05 MIL/uL — ABNORMAL LOW (ref 4.22–5.81)
RDW: 15.5 % (ref 11.5–15.5)
WBC: 13.9 10*3/uL — ABNORMAL HIGH (ref 4.0–10.5)
nRBC: 0 % (ref 0.0–0.2)

## 2019-12-18 NOTE — Progress Notes (Signed)
PROGRESS NOTE    Johnathan Mitchell.  SAY:[id number] DOB: 1934/02/26 DOA: 12/14/2019 [facility]    Brief Narrative:  85 y.o. male with medical history significant of asthma, dementia, hypertension, seizures, history of other known embolic CVA with residual left-sided hemiparesis who is brought to the emergency department via EMS due to due to left hand swelling/tenderness for the past few weeks associated with weakness, decreased oral intake and worsening confusion.  He was seen at [facility] where he had a negative x-ray and was put in a brace.  He has also been having lower abdominal and back pain recently.   In the ED, L hand xray was notable for diffuse swelling without fracture or dislocation, bone destruction, or periostel reaction. Pelvic xray was notable for comminuted L acetabular fracture  Assessment & Plan:   Principal Problem:   Closed left acetabular fracture (HCC) Active Problems:   Benign essential HTN   Dementia with behavioral disturbance (HCC)   Seizures (HCC)   SIRS (systemic inflammatory response syndrome) (HCC)   Cellulitis of left hand   Wrist pain  Principal Problem:   Closed left acetabular fracture [facility]) -Orthopedic Surgery following -Recommendation for TDWB LLE and to f/u with Dr. Marcelino Scot in 2-3 weeks to assess healing -continue with analgesics as needed -TOC following. Planning SNF placement  Active Problems:   SIRS (systemic inflammatory response syndrome) (HCC) ruled out In the setting of trauma and leukocytosis at time of presentation Tachycardia likely related to acute pain with mild leukocytosis likely secondary to acute fracture. Mild leukocytosis is likely related to below steroids    Cellulitis of left hand ruled out Was started on empiric abx at time of presentation, will d/c abx Procalcitonin of 0.35 Blood cultures neg ESR elevated. L wrist with swelling noted on imaging. Seems improved after trial of IV steroids.  Consider  referral to Rheumatology as outpatient    Benign essential HTN BP remains stable Continue antihypertensive as tolerated    Dementia with behavioral disturbance (HCC) Off Depakote ER and Lexapro. Continue to reorient as tolerated.    Seizures ([place]) Had been off Vimpat as listed on home med rec. Discussed with pharmacy Stable. Cont to monitor for now  DVT prophylaxis: Lovenox subq Code Status: Full Family Communication: Pt in room, Updated daughter over phone recently  Status is: Inpatient  The patient will require care spanning > 2 midnights and should be moved to inpatient because: Altered mental status and Unsafe d/c plan  Dispo: The patient is from: Home              Anticipated d/c is to: SNF              Anticipated d/c date is: 3 days              Patient currently is not medically stable to d/c.   Consultants:   Orthopedic Surgery  Procedures:     Antimicrobials: Anti-infectives (From admission, onward)   Start     Dose/Rate Route Frequency Ordered Stop   12/15/19 1600  vancomycin (VANCOREADY) IVPB 750 mg/150 mL  Status:  Discontinued        750 mg 150 mL/hr over 60 Minutes Intravenous Every 24 hours 12/14/19 1926 12/15/19 1004   12/14/19 2200  ceFEPIme (MAXIPIME) 2 g in sodium chloride 0.9 % 100 mL IVPB  Status:  Discontinued        2 g 200 mL/hr over 30 Minutes Intravenous Every 12 hours 12/14/19 1926 12/15/19 1004  12/14/19 1215  ceFEPIme (MAXIPIME) 2 g in sodium chloride 0.9 % 100 mL IVPB        2 g 200 mL/hr over 30 Minutes Intravenous  Once 12/14/19 1206 12/14/19 1530   12/14/19 1215  vancomycin (VANCOCIN) IVPB 1000 mg/200 mL premix        1,000 mg 200 mL/hr over 60 Minutes Intravenous  Once 12/14/19 1206 12/14/19 2058      Subjective: Confused this AM, did not grimace or show evidence of pain when palpated L wrist  Objective: Vitals:   12/17/19 1417 12/17/19 2110 12/18/19 0544 12/18/19 1226  BP: (!) 154/105 (!) 145/89 (!) 153/82 (!) 150/102   Pulse: 74 66 61 72  Resp: _0 Temp: 97.8 F (36.6 C) 98.1 F (36.7 C) 98.6 F (37 C) 98.1 F (36.7 C)  TempSrc:  Oral Oral Oral  SpO2: 100% 100% 95% 100%  Weight:      Height:        Intake/Output Summary (Last 24 hours) at 12/18/2019 1552 Last data filed at 12/18/2019 1100 Gross per 24 hour  Intake 0 ml  Output --  Net 0 ml   Filed Weights   12/14/19 2200  Weight: 67.5 kg    Examination: General exam: Awake, laying in bed, in nad Respiratory system: Normal respiratory effort, no wheezing Cardiovascular system: regular rate, s1, s2 Gastrointestinal system: Soft, nondistended, positive BS Central nervous system: CN2-12 grossly intact, strength intact Extremities: Perfused, no clubbing Skin: Normal skin turgor, no notable skin lesions seen Psychiatry: difficult to assess given mentation  Data Reviewed: I have personally reviewed following labs and imaging studies  CBC: Recent Labs  Lab 12/14/19 1115 12/15/19 0540 12/16/19 0534 12/18/19 0611  WBC 14.1* 9.9 12.2* 13.9*  NEUTROABS 12.6* 7.9*  --   --   HGB 13.0 10.7* 10.3* 10.5*  HCT 40.7 34.8* 33.3* 33.6*  MCV 81.7 84.9 84.3 83.0  PLT 238 173 173 771   Basic Metabolic Panel: Recent Labs  Lab 12/14/19 1115 12/15/19 0540 12/16/19 0534 12/18/19 0611  NA 138 135 137 135  K 4.0 4.4 4.1 3.8  CL 98 103 103 102  CO2 _1 GLUCOSE 110* 93 128* 92  BUN 23 22 24* 18  CREATININE 1.21 1.14 0.97 0.63  CALCIUM 10.1 8.6* 8.5* 8.6*   GFR: Estimated Creatinine Clearance: 66.8 mL/min (by C-G formula based on SCr of 0.63 mg/dL). Liver Function Tests: Recent Labs  Lab 12/14/19 1115 12/15/19 0540 12/16/19 0534 12/18/19 0611  AST _2 ALT _3 ALKPHOS 76 57 54 57  BILITOT 0.8 1.2 0.6 0.4  PROT 8.9* 6.5 6.6 6.4*  ALBUMIN 3.5 2.4* 2.5* 2.5*   Recent Labs  Lab 12/14/19 1115  LIPASE 27   No results for input(s): AMMONIA in the last 168 hours. Coagulation Profile: No results  for input(s): INR, PROTIME in the last 168 hours. Cardiac Enzymes: No results for input(s): CKTOTAL, CKMB, CKMBINDEX, TROPONINI in the last 168 hours. BNP (last 3 results) No results for input(s): PROBNP in the last 8760 hours. HbA1C: No results for input(s): HGBA1C in the last 72 hours. CBG: Recent Labs  Lab 12/14/19 1125  GLUCAP 98   Lipid Profile: No results for input(s): CHOL, HDL, LDLCALC, TRIG, CHOLHDL, LDLDIRECT in the last 72 hours. Thyroid Function Tests: No results for input(s): TSH, T4TOTAL, FREET4, T3FREE, THYROIDAB in the last 72 hours. Anemia Panel: No results for input(s): VITAMINB12, FOLATE,  FERRITIN, TIBC, IRON, RETICCTPCT in the last 72 hours. Sepsis Labs: Recent Labs  Lab 12/14/19 1115 12/14/19 1500 12/14/19 2040 12/15/19 0540  PROCALCITON  --   --  0.34 0.35  LATICACIDVEN 2.1* 2.0*  --   --     Recent Results (from the past 240 hour(s))  Culture, blood (routine x 2)     Status: None (Preliminary result)   Collection Time: 12/14/19 12:48 PM   Specimen: BLOOD  Result Value Ref Range Status   Specimen Description   Final    BLOOD RIGHT ARM Performed at [facility] [address] [place], [place] [address]    Special Requests   Final    BOTTLES DRAWN AEROBIC AND ANAEROBIC Blood Culture results may not be optimal due to an inadequate volume of blood received in culture bottles Performed at [facility] [address] [place], [place] [address]    Culture   Final    NO GROWTH 4 DAYS Performed at [facility], [place] [address], [place], [place] [address]    Report Status PENDING  Incomplete  Culture, blood (routine x 2)     Status: None (Preliminary result)   Collection Time: 12/14/19  1:45 PM   Specimen: BLOOD  Result Value Ref Range Status   Specimen Description   Final    BLOOD LEFT ARM Performed at [facility] [address] [place], [place] [address]    Special Requests    Final    BOTTLES DRAWN AEROBIC AND ANAEROBIC Blood Culture results may not be optimal due to an inadequate volume of blood received in culture bottles Performed at [facility] [address] [place], [place] [address]    Culture   Final    NO GROWTH 4 DAYS Performed at [facility], [place] [address] [place], [place] [address]    Report Status PENDING  Incomplete  Urine culture     Status: None   Collection Time: 12/14/19  5:00 PM   Specimen: Urine, Random  Result Value Ref Range Status   Specimen Description   Final    URINE, RANDOM Performed at [facility] [address] [place], [place] [address]    Special Requests   Final    NONE Performed at [facility], [place] [address], [place], [place] [address]    Culture   Final    NO GROWTH Performed at [facility], [place] [address], [place],  [address]    Report Status 12/16/2019 FINAL  Final  Respiratory Panel by RT PCR (Flu A&B, Covid) - Nasopharyngeal Swab     Status: None   Collection Time: 12/14/19  6:32 PM   Specimen: Nasopharyngeal Swab  Result Value Ref Range Status   SARS Coronavirus 2 by RT PCR NEGATIVE NEGATIVE Final    Comment: (NOTE) SARS-CoV-2 target nucleic acids are NOT DETECTED.  The SARS-CoV-2 RNA is generally detectable in upper respiratoy specimens during the acute phase of infection. The lowest concentration of SARS-CoV-2 viral copies this assay can detect is 131 copies/mL. A negative result does not preclude SARS-Cov-2 infection and should not be used as the sole basis for treatment or other patient management decisions. A negative result may occur with  improper specimen collection/handling, submission of specimen other than nasopharyngeal swab, presence of viral mutation(s) within the areas targeted by this assay, and inadequate number of viral copies (<131 copies/mL). A negative result must  be combined with  clinical observations, patient history, and epidemiological information. The expected result is Negative.  Fact Sheet for Patients:  PinkCheek.be  Fact Sheet for Healthcare Providers:  GravelBags.it  This test is no t yet approved or cleared by the [place] FDA and  has been authorized for detection and/or diagnosis of SARS-CoV-2 by FDA under an Emergency Use Authorization (EUA). This EUA will remain  in effect (meaning this test can be used) for the duration of the COVID-19 declaration under Section 564(b)(1) of the Act, 21 U.S.C. section 360bbb-3(b)(1), unless the authorization is terminated or revoked sooner.     Influenza A by PCR NEGATIVE NEGATIVE Final   Influenza B by PCR NEGATIVE NEGATIVE Final    Comment: (NOTE) The Xpert Xpress SARS-CoV-2/FLU/RSV assay is intended as an aid in  the diagnosis of influenza from Nasopharyngeal swab specimens and  should not be used as a sole basis for treatment. Nasal washings and  aspirates are unacceptable for Xpert Xpress SARS-CoV-2/FLU/RSV  testing.  Fact Sheet for Patients: PinkCheek.be  Fact Sheet for Healthcare Providers: GravelBags.it  This test is not yet approved or cleared by the [place] FDA and  has been authorized for detection and/or diagnosis of SARS-CoV-2 by  FDA under an Emergency Use Authorization (EUA). This EUA will remain  in effect (meaning this test can be used) for the duration of the  Covid-19 declaration under Section 564(b)(1) of the Act, 21  U.S.C. section 360bbb-3(b)(1), unless the authorization is  terminated or revoked. Performed at [facility], [place] [address] [place], [place] [address]   SARS Coronavirus 2 by RT PCR (hospital order, performed in [facility] hospital lab) Nasopharyngeal Nasopharyngeal Swab     Status: None   Collection Time: 12/17/19  2:20  PM   Specimen: Nasopharyngeal Swab  Result Value Ref Range Status   SARS Coronavirus 2 NEGATIVE NEGATIVE Final    Comment: (NOTE) SARS-CoV-2 target nucleic acids are NOT DETECTED.  The SARS-CoV-2 RNA is generally detectable in upper and lower respiratory specimens during the acute phase of infection. The lowest concentration of SARS-CoV-2 viral copies this assay can detect is 250 copies / mL. A negative result does not preclude SARS-CoV-2 infection and should not be used as the sole basis for treatment or other patient management decisions.  A negative result may occur with improper specimen collection / handling, submission of specimen other than nasopharyngeal swab, presence of viral mutation(s) within the areas targeted by this assay, and inadequate number of viral copies (<250 copies / mL). A negative result must be combined with clinical observations, patient history, and epidemiological information.  Fact Sheet for Patients:   StrictlyIdeas.no  Fact Sheet for Healthcare Providers: BankingDealers.co.za  This test is not yet approved or  cleared by the [place] FDA and has been authorized for detection and/or diagnosis of SARS-CoV-2 by FDA under an Emergency Use Authorization (EUA).  This EUA will remain in effect (meaning this test can be used) for the duration of the COVID-19 declaration under Section 564(b)(1) of the Act, 21 U.S.C. section 360bbb-3(b)(1), unless the authorization is terminated or revoked sooner.  Performed at [facility], [place] [address] [place], [place] [address]      Radiology Studies: No results found.  Scheduled Meds: . (feeding supplement) PROSource Plus  30 mL Oral Daily  . atorvastatin  80 mg Oral q1800  . enoxaparin (LOVENOX) injection  40 mg Subcutaneous Q24H  . senna-docusate  1 tablet Oral BID  Continuous Infusions:   LOS: 3 days   Marylu Lund, MD [facility] Pager On Amion  If 7PM-7AM, please contact night-coverage 12/18/2019, 3:52 PM

## 2019-12-19 DIAGNOSIS — S32402A Unspecified fracture of left acetabulum, initial encounter for closed fracture: Secondary | ICD-10-CM | POA: Diagnosis not present

## 2019-12-19 DIAGNOSIS — M25532 Pain in left wrist: Secondary | ICD-10-CM

## 2019-12-19 DIAGNOSIS — I10 Essential (primary) hypertension: Secondary | ICD-10-CM | POA: Diagnosis not present

## 2019-12-19 DIAGNOSIS — R6510 Systemic inflammatory response syndrome (SIRS) of non-infectious origin without acute organ dysfunction: Secondary | ICD-10-CM

## 2019-12-19 DIAGNOSIS — F0151 Vascular dementia with behavioral disturbance: Secondary | ICD-10-CM | POA: Diagnosis not present

## 2019-12-19 DIAGNOSIS — M109 Gout, unspecified: Principal | ICD-10-CM

## 2019-12-19 LAB — CBC WITH DIFFERENTIAL/PLATELET
Abs Immature Granulocytes: 0.09 10*3/uL — ABNORMAL HIGH (ref 0.00–0.07)
Basophils Absolute: 0.1 10*3/uL (ref 0.0–0.1)
Basophils Relative: 1 %
Eosinophils Absolute: 0.3 10*3/uL (ref 0.0–0.5)
Eosinophils Relative: 4 %
HCT: 41.2 % (ref 39.0–52.0)
Hemoglobin: 12.8 g/dL — ABNORMAL LOW (ref 13.0–17.0)
Immature Granulocytes: 1 %
Lymphocytes Relative: 20 %
Lymphs Abs: 1.5 10*3/uL (ref 0.7–4.0)
MCH: 26.2 pg (ref 26.0–34.0)
MCHC: 31.1 g/dL (ref 30.0–36.0)
MCV: 84.4 fL (ref 80.0–100.0)
Monocytes Absolute: 0.8 10*3/uL (ref 0.1–1.0)
Monocytes Relative: 10 %
Neutro Abs: 4.9 10*3/uL (ref 1.7–7.7)
Neutrophils Relative %: 64 %
Platelets: 191 10*3/uL (ref 150–400)
RBC: 4.88 MIL/uL (ref 4.22–5.81)
RDW: 15.7 % — ABNORMAL HIGH (ref 11.5–15.5)
WBC: 7.7 10*3/uL (ref 4.0–10.5)
nRBC: 0 % (ref 0.0–0.2)

## 2019-12-19 LAB — CULTURE, BLOOD (ROUTINE X 2)
Culture: NO GROWTH
Culture: NO GROWTH

## 2019-12-19 LAB — URIC ACID: Uric Acid, Serum: 4.1 mg/dL (ref 3.7–8.6)

## 2019-12-19 MED ORDER — COLCHICINE 0.6 MG PO TABS
1.2000 mg | ORAL_TABLET | Freq: Once | ORAL | Status: AC
  Administered 2019-12-19: 1.2 mg via ORAL
  Filled 2019-12-19: qty 2

## 2019-12-19 MED ORDER — LABETALOL HCL 5 MG/ML IV SOLN: 10.0000 mg | INTRAVENOUS | Status: DC | PRN

## 2019-12-19 MED ORDER — ACETAMINOPHEN 500 MG PO TABS
1000.0000 mg | ORAL_TABLET | Freq: Three times a day (TID) | ORAL | Status: DC
  Administered 2019-12-19 – 2019-12-20 (×3): 1000 mg via ORAL
  Filled 2019-12-19 (×3): qty 2

## 2019-12-19 MED ORDER — AMLODIPINE BESYLATE 5 MG PO TABS
5.0000 mg | ORAL_TABLET | Freq: Every day | ORAL | Status: DC
  Administered 2019-12-19 – 2019-12-20 (×2): 5 mg via ORAL
  Filled 2019-12-19 (×2): qty 1

## 2019-12-19 MED ORDER — COLCHICINE 0.6 MG PO TABS
0.6000 mg | ORAL_TABLET | Freq: Every day | ORAL | Status: DC
  Administered 2019-12-20: 0.6 mg via ORAL
  Filled 2019-12-19: qty 1

## 2019-12-19 NOTE — Progress Notes (Signed)
While cleaning the patient up after having a large bowel movement, BRBPR was noted. MD notified and came to bedside to assess. Lovenox discontinued and ordered to place SCDs. Will continue to monitor.

## 2019-12-19 NOTE — Progress Notes (Signed)
PROGRESS NOTE  Johnathan Mitchell. HFW:[id number] DOB: December 01, 1934   [facility]  Patient is from: Home  DOA: 12/14/2019 LOS: 4  Chief complaints: Left hand swelling, tenderness or weakness  Brief Narrative / Interim history: 85 year old male with history of embolic CVA with left hemiparesis, dementia, seizure disorder, HTN and asthma brought to ED by EMS due to left hand swelling, tenderness and weakness for few weeks with associated poor oral intake and altered mental status.  Reportedly seen at [facility] where he had "negative x-ray" and was put in brace.  He also had low abdominal pain and back pain recently.  In ED, left wrist and hand x-ray without displaced fracture or dislocation but severe radiocarpal and carpal arthrosis with subchondral lucencies suggestive for erosive arthropathy such as gout and diffuse soft tissue edema about the hand and wrist.  Pelvic x-ray was notable for comminuted left acetabular fracture.  Orthopedic surgery consulted and recommended TDWB LLE and outpatient follow-up with Dr. Marcelino Scot in 2 to 3 weeks.  Patient was evaluated by therapy who recommended SNF.  Subjective: Seen and examined earlier this morning.  No major events overnight of this morning.  He reports ongoing left wrist pain.  He is not a great historian.  He is only oriented to self and place.  He seems to have some flexion deformity in left wrist. Somewhat warm and tender to touch.  He was not able to tolerate further assessment of his left wrist.  Objective: Vitals:   12/18/19 0544 12/18/19 1226 12/18/19 2126 12/19/19 0937  BP: (!) 153/82 (!) 150/102 (!) 164/101 (!) 158/111  Pulse: 61 72 66   Resp: 16 13 18    Temp: 98.6 F (37 C) 98.1 F (36.7 C) 98.1 F (36.7 C)   TempSrc: Oral Oral    SpO2: 95% 100% 97%   Weight:      Height:        Intake/Output Summary (Last 24 hours) at 12/19/2019 1152 Last data filed at 12/19/2019 0500 Gross per 24 hour  Intake --  Output 600 ml   Net -600 ml   Filed Weights   12/14/19 2200  Weight: 67.5 kg    Examination:  GENERAL: No apparent distress.  Nontoxic. HEENT: MMM.  Vision and hearing grossly intact.  NECK: Supple.  No apparent JVD.  RESP:  No IWOB.  Fair aeration bilaterally. CVS:  RRR. Heart sounds normal.  ABD/GI/GU: BS+. Abd soft, NTND.  MSK/EXT:  Moves extremities.  Notable flexion deformity in left wrist.  Tender and increased warmth to touch. SKIN: no apparent skin lesion or wound NEURO: Awake and alert.  Oriented x2.  Some left hemiparesis but limited exam due to pain in LUE. PSYCH: Calm. Normal affect.  Procedures:  None  Microbiology summarized: OYDXA-12, influenza and RSV PCR nonreactive. Blood cultures NGTD.  Assessment & Plan: Closed left acetabular fracture ([place]):  -TDWB on LLE and outpatient follow-up with Dr. Halford Decamp in 2 to 3 weeks to assess healing. -Pain control with scheduled Tylenol and as needed oxycodone and Dilaudid. -Continue bowel regimen -Therapy recommended SNF.  TOC working on this.  Acute gout flareup of left wrist: Patient with elevated ESR and CRP.  Left wrist and hand x-ray suggestive for acute gout flareup.  He has some flexion deformity,, tenderness and increased warmth to touch.  Some improvement with IV steroid. -Check uric acid -Start colchicine  SIRS: Resolved.  Sepsis and cellulitis ruled out.  Essential hypertension: BP elevated.  Could be mediated by pain. -  Pain control as above -Start amlodipine 5 mg daily -As needed labetalol  Dementia with behavioral disturbance ([place]): Oriented x2 -Reorientation and delirium precautions.  History of seizures ([place]): Does not take Depakote or Vimpat at home. -Continue monitoring  Mood disorder: Stable.  Does not take Depakote or Lexapro.   Body mass index is 22.63 kg/m. Nutrition Problem: Increased nutrient needs Etiology: acute illness Signs/Symptoms: estimated needs Interventions: Other (Comment) (prosource  plus)   DVT prophylaxis:  enoxaparin (LOVENOX) injection 40 mg Start: 12/15/19 2200  Code Status: Full code Family Communication: Patient and/or RN. Available if any question.  Status is: Inpatient  Remains inpatient appropriate because:Ongoing active pain requiring inpatient pain management and Unsafe d/c plan   Dispo: The patient is from: Home              Anticipated d/c is to: SNF              Anticipated d/c date is: 2 days              Patient currently is medically stable to d/c.       Consultants:  Orthopedic surgery   Sch Meds:  Scheduled Meds:  (feeding supplement) PROSource Plus  30 mL Oral Daily   amLODipine  5 mg Oral Daily   atorvastatin  80 mg Oral q1800   enoxaparin (LOVENOX) injection  40 mg Subcutaneous Q24H   senna-docusate  1 tablet Oral BID   Continuous Infusions: PRN Meds:.acetaminophen **OR** acetaminophen, bisacodyl, hydrocerin, HYDROmorphone (DILAUDID) injection, labetalol, ondansetron **OR** ondansetron (ZOFRAN) IV, oxyCODONE  Antimicrobials: Anti-infectives (From admission, onward)   Start     Dose/Rate Route Frequency Ordered Stop   12/15/19 1600  vancomycin (VANCOREADY) IVPB 750 mg/150 mL  Status:  Discontinued        750 mg 150 mL/hr over 60 Minutes Intravenous Every 24 hours 12/14/19 1926 12/15/19 1004   12/14/19 2200  ceFEPIme (MAXIPIME) 2 g in sodium chloride 0.9 % 100 mL IVPB  Status:  Discontinued        2 g 200 mL/hr over 30 Minutes Intravenous Every 12 hours 12/14/19 1926 12/15/19 1004   12/14/19 1215  ceFEPIme (MAXIPIME) 2 g in sodium chloride 0.9 % 100 mL IVPB        2 g 200 mL/hr over 30 Minutes Intravenous  Once 12/14/19 1206 12/14/19 1530   12/14/19 1215  vancomycin (VANCOCIN) IVPB 1000 mg/200 mL premix        1,000 mg 200 mL/hr over 60 Minutes Intravenous  Once 12/14/19 1206 12/14/19 2058       I have personally reviewed the following labs and images: CBC: Recent Labs  Lab 12/14/19 1115 12/15/19 0540  12/16/19 0534 12/18/19 0611  WBC 14.1* 9.9 12.2* 13.9*  NEUTROABS 12.6* 7.9*  --   --   HGB 13.0 10.7* 10.3* 10.5*  HCT 40.7 34.8* 33.3* 33.6*  MCV 81.7 84.9 84.3 83.0  PLT 238 173 173 186   BMP &GFR Recent Labs  Lab 12/14/19 1115 12/15/19 0540 12/16/19 0534 12/18/19 0611  NA 138 135 137 135  K 4.0 4.4 4.1 3.8  CL 98 103 103 102  CO2 27 24 22 23   GLUCOSE 110* 93 128* 92  BUN 23 22 24* 18  CREATININE 1.21 1.14 0.97 0.63  CALCIUM 10.1 8.6* 8.5* 8.6*   Estimated Creatinine Clearance: 66.8 mL/min (by C-G formula based on SCr of 0.63 mg/dL). Liver & Pancreas: Recent Labs  Lab 12/14/19 1115 12/15/19 0540 12/16/19 0534 12/18/19 7412  AST 25 26 23 16   ALT 24 16 15 14   ALKPHOS 76 57 54 57  BILITOT 0.8 1.2 0.6 0.4  PROT 8.9* 6.5 6.6 6.4*  ALBUMIN 3.5 2.4* 2.5* 2.5*   Recent Labs  Lab 12/14/19 1115  LIPASE 27   No results for input(s): AMMONIA in the last 168 hours. Diabetic: No results for input(s): HGBA1C in the last 72 hours. Recent Labs  Lab 12/14/19 1125  GLUCAP 98   Cardiac Enzymes: No results for input(s): CKTOTAL, CKMB, CKMBINDEX, TROPONINI in the last 168 hours. No results for input(s): PROBNP in the last 8760 hours. Coagulation Profile: No results for input(s): INR, PROTIME in the last 168 hours. Thyroid Function Tests: No results for input(s): TSH, T4TOTAL, FREET4, T3FREE, THYROIDAB in the last 72 hours. Lipid Profile: No results for input(s): CHOL, HDL, LDLCALC, TRIG, CHOLHDL, LDLDIRECT in the last 72 hours. Anemia Panel: No results for input(s): VITAMINB12, FOLATE, FERRITIN, TIBC, IRON, RETICCTPCT in the last 72 hours. Urine analysis:    Component Value Date/Time   COLORURINE YELLOW 12/14/2019 1700   APPEARANCEUR HAZY (A) 12/14/2019 1700   LABSPEC 1.040 (H) 12/14/2019 1700   PHURINE 5.0 12/14/2019 1700   GLUCOSEU NEGATIVE 12/14/2019 1700   HGBUR MODERATE (A) 12/14/2019 1700   BILIRUBINUR NEGATIVE 12/14/2019 1700   KETONESUR NEGATIVE  12/14/2019 1700   PROTEINUR 30 (A) 12/14/2019 1700   UROBILINOGEN 1.0 02/01/2014 1507   NITRITE NEGATIVE 12/14/2019 1700   LEUKOCYTESUR NEGATIVE 12/14/2019 1700   Sepsis Labs: Invalid input(s): PROCALCITONIN, [place]  Microbiology: Recent Results (from the past 240 hour(s))  Culture, blood (routine x 2)     Status: None   Collection Time: 12/14/19 12:48 PM   Specimen: BLOOD  Result Value Ref Range Status   Specimen Description   Final    BLOOD RIGHT ARM Performed at [facility] [address], [place], [place] [address]    Special Requests   Final    BOTTLES DRAWN AEROBIC AND ANAEROBIC Blood Culture results may not be optimal due to an inadequate volume of blood received in culture bottles Performed at [facility] [address] [place], [place] [address]    Culture   Final    NO GROWTH 5 DAYS Performed at [facility], [place] [address], [place], [place] [address]    Report Status 12/19/2019 FINAL  Final  Culture, blood (routine x 2)     Status: None   Collection Time: 12/14/19  1:45 PM   Specimen: BLOOD  Result Value Ref Range Status   Specimen Description   Final    BLOOD LEFT ARM Performed at [facility] [address], [place], [place] [address]    Special Requests   Final    BOTTLES DRAWN AEROBIC AND ANAEROBIC Blood Culture results may not be optimal due to an inadequate volume of blood received in culture bottles Performed at [facility] [address] [place], [place] [address]    Culture   Final    NO GROWTH 5 DAYS Performed at [facility], [place] [address], [place],  [address]    Report Status 12/19/2019 FINAL  Final  Urine culture     Status: None   Collection Time: 12/14/19  5:00 PM   Specimen: Urine, Random  Result Value Ref Range Status   Specimen Description   Final    URINE, RANDOM Performed at [facility] [address] [place],  [place] [address]    Special Requests   Final    NONE Performed at [facility], [place] [address], [place], [place] [address]    Culture   Final    NO GROWTH Performed at [facility], [place] [address], [place], [place] [address]    Report Status 12/16/2019 FINAL  Final  Respiratory Panel by RT PCR (Flu A&B, Covid) - Nasopharyngeal Swab     Status: None   Collection Time: 12/14/19  6:32 PM   Specimen: Nasopharyngeal Swab  Result Value Ref Range Status   SARS Coronavirus 2 by RT PCR NEGATIVE NEGATIVE Final    Comment: (NOTE) SARS-CoV-2 target nucleic acids are NOT DETECTED.  The SARS-CoV-2 RNA is generally detectable in upper respiratoy specimens during the acute phase of infection. The lowest concentration of SARS-CoV-2 viral copies this assay can detect is 131 copies/mL. A negative result does not preclude SARS-Cov-2 infection and should not be used as the sole basis for treatment or other patient management decisions. A negative result may occur with  improper specimen collection/handling, submission of specimen other than nasopharyngeal swab, presence of viral mutation(s) within the areas targeted by this assay, and inadequate number of viral copies (<131 copies/mL). A negative result must be combined with clinical observations, patient history, and epidemiological information. The expected result is Negative.  Fact Sheet for Patients:  PinkCheek.be  Fact Sheet for Healthcare Providers:  GravelBags.it  This test is no t yet approved or cleared by the [place] FDA and  has been authorized for detection and/or diagnosis of SARS-CoV-2 by FDA under an Emergency Use Authorization (EUA). This EUA will remain  in effect (meaning this test can be used) for the duration of the COVID-19 declaration under Section 564(b)(1) of the Act, 21 U.S.C. section 360bbb-3(b)(1), unless the  authorization is terminated or revoked sooner.     Influenza A by PCR NEGATIVE NEGATIVE Final   Influenza B by PCR NEGATIVE NEGATIVE Final    Comment: (NOTE) The Xpert Xpress SARS-CoV-2/FLU/RSV assay is intended as an aid in  the diagnosis of influenza from Nasopharyngeal swab specimens and  should not be used as a sole basis for treatment. Nasal washings and  aspirates are unacceptable for Xpert Xpress SARS-CoV-2/FLU/RSV  testing.  Fact Sheet for Patients: PinkCheek.be  Fact Sheet for Healthcare Providers: GravelBags.it  This test is not yet approved or cleared by the [place] FDA and  has been authorized for detection and/or diagnosis of SARS-CoV-2 by  FDA under an Emergency Use Authorization (EUA). This EUA will remain  in effect (meaning this test can be used) for the duration of the  Covid-19 declaration under Section 564(b)(1) of the Act, 21  U.S.C. section 360bbb-3(b)(1), unless the authorization is  terminated or revoked. Performed at [facility], [place] [address], [place], [place] [address]   SARS Coronavirus 2 by RT PCR (hospital order, performed in [facility] hospital lab) Nasopharyngeal Nasopharyngeal Swab     Status: None   Collection Time: 12/17/19  2:20 PM   Specimen: Nasopharyngeal Swab  Result Value Ref Range Status   SARS Coronavirus 2 NEGATIVE NEGATIVE Final    Comment: (NOTE) SARS-CoV-2 target nucleic acids are NOT DETECTED.  The SARS-CoV-2 RNA is generally detectable in upper and lower respiratory specimens during the acute phase of infection. The lowest concentration of SARS-CoV-2 viral copies this assay can detect is 250 copies / mL. A negative result does not preclude SARS-CoV-2 infection and should not be  used as the sole basis for treatment or other patient management decisions.  A negative result may occur with improper specimen collection / handling, submission of  specimen other than nasopharyngeal swab, presence of viral mutation(s) within the areas targeted by this assay, and inadequate number of viral copies (<250 copies / mL). A negative result must be combined with clinical observations, patient history, and epidemiological information.  Fact Sheet for Patients:   StrictlyIdeas.no  Fact Sheet for Healthcare Providers: BankingDealers.co.za  This test is not yet approved or  cleared by the [place] FDA and has been authorized for detection and/or diagnosis of SARS-CoV-2 by FDA under an Emergency Use Authorization (EUA).  This EUA will remain in effect (meaning this test can be used) for the duration of the COVID-19 declaration under Section 564(b)(1) of the Act, 21 U.S.C. section 360bbb-3(b)(1), unless the authorization is terminated or revoked sooner.  Performed at [facility], [place] [address], [place], [place] [address]     Radiology Studies: No results found.   Onyx Edgley T. Shelton  If 7PM-7AM, please contact night-coverage [email or web address] 12/19/2019, 11:52 AM

## 2019-12-19 NOTE — Plan of Care (Signed)

## 2019-12-20 DIAGNOSIS — E440 Moderate protein-calorie malnutrition: Secondary | ICD-10-CM

## 2019-12-20 DIAGNOSIS — R531 Weakness: Secondary | ICD-10-CM | POA: Diagnosis not present

## 2019-12-20 DIAGNOSIS — R6510 Systemic inflammatory response syndrome (SIRS) of non-infectious origin without acute organ dysfunction: Secondary | ICD-10-CM | POA: Diagnosis not present

## 2019-12-20 DIAGNOSIS — G40909 Epilepsy, unspecified, not intractable, without status epilepticus: Secondary | ICD-10-CM | POA: Diagnosis not present

## 2019-12-20 DIAGNOSIS — R5381 Other malaise: Secondary | ICD-10-CM

## 2019-12-20 DIAGNOSIS — J45909 Unspecified asthma, uncomplicated: Secondary | ICD-10-CM | POA: Diagnosis not present

## 2019-12-20 DIAGNOSIS — F0150 Vascular dementia without behavioral disturbance: Secondary | ICD-10-CM | POA: Diagnosis not present

## 2019-12-20 DIAGNOSIS — I69359 Hemiplegia and hemiparesis following cerebral infarction affecting unspecified side: Secondary | ICD-10-CM | POA: Diagnosis not present

## 2019-12-20 DIAGNOSIS — S32402D Unspecified fracture of left acetabulum, subsequent encounter for fracture with routine healing: Secondary | ICD-10-CM | POA: Diagnosis not present

## 2019-12-20 DIAGNOSIS — M109 Gout, unspecified: Secondary | ICD-10-CM | POA: Diagnosis not present

## 2019-12-20 DIAGNOSIS — E46 Unspecified protein-calorie malnutrition: Secondary | ICD-10-CM | POA: Diagnosis not present

## 2019-12-20 DIAGNOSIS — Z87898 Personal history of other specified conditions: Secondary | ICD-10-CM

## 2019-12-20 DIAGNOSIS — F329 Major depressive disorder, single episode, unspecified: Secondary | ICD-10-CM | POA: Diagnosis not present

## 2019-12-20 DIAGNOSIS — F0391 Unspecified dementia with behavioral disturbance: Secondary | ICD-10-CM | POA: Diagnosis not present

## 2019-12-20 DIAGNOSIS — F39 Unspecified mood [affective] disorder: Secondary | ICD-10-CM | POA: Diagnosis not present

## 2019-12-20 DIAGNOSIS — S32402A Unspecified fracture of left acetabulum, initial encounter for closed fracture: Secondary | ICD-10-CM | POA: Diagnosis not present

## 2019-12-20 DIAGNOSIS — Z8673 Personal history of transient ischemic attack (TIA), and cerebral infarction without residual deficits: Secondary | ICD-10-CM

## 2019-12-20 DIAGNOSIS — I10 Essential (primary) hypertension: Secondary | ICD-10-CM | POA: Diagnosis not present

## 2019-12-20 DIAGNOSIS — R269 Unspecified abnormalities of gait and mobility: Secondary | ICD-10-CM | POA: Diagnosis not present

## 2019-12-20 DIAGNOSIS — S32409A Unspecified fracture of unspecified acetabulum, initial encounter for closed fracture: Secondary | ICD-10-CM | POA: Diagnosis not present

## 2019-12-20 DIAGNOSIS — F0151 Vascular dementia with behavioral disturbance: Secondary | ICD-10-CM | POA: Diagnosis not present

## 2019-12-20 DIAGNOSIS — I69354 Hemiplegia and hemiparesis following cerebral infarction affecting left non-dominant side: Secondary | ICD-10-CM | POA: Diagnosis not present

## 2019-12-20 MED ORDER — AMLODIPINE BESYLATE 10 MG PO TABS
10.0000 mg | ORAL_TABLET | Freq: Every day | ORAL | Status: DC
Start: 2019-12-21 — End: 2021-03-12

## 2019-12-20 MED ORDER — AMLODIPINE BESYLATE 5 MG PO TABS
5.0000 mg | ORAL_TABLET | Freq: Every day | ORAL | Status: DC
Start: 2019-12-21 — End: 2019-12-20

## 2019-12-20 MED ORDER — PROSOURCE PLUS PO LIQD: 30.0000 mL | Freq: Every day | ORAL | Status: DC

## 2019-12-20 MED ORDER — COLCHICINE 0.6 MG PO TABS
0.6000 mg | ORAL_TABLET | Freq: Every day | ORAL | Status: DC
Start: 2019-12-21 — End: 2020-02-04

## 2019-12-20 MED ORDER — ACETAMINOPHEN 500 MG PO TABS: 500.0000 mg | ORAL_TABLET | Freq: Three times a day (TID) | ORAL | Status: AC

## 2019-12-20 MED ORDER — SENNOSIDES-DOCUSATE SODIUM 8.6-50 MG PO TABS
1.0000 | ORAL_TABLET | Freq: Two times a day (BID) | ORAL | Status: DC
Start: 2019-12-20 — End: 2021-03-12

## 2019-12-20 MED ORDER — ASPIRIN EC 81 MG PO TBEC: 81.0000 mg | DELAYED_RELEASE_TABLET | Freq: Every day | ORAL | 3 refills | Status: AC

## 2019-12-20 MED ORDER — BISACODYL 5 MG PO TBEC: 5.0000 mg | DELAYED_RELEASE_TABLET | Freq: Every day | ORAL | 0 refills | Status: DC | PRN

## 2019-12-20 NOTE — Progress Notes (Signed)
Pt explained discharge instructions and the printed AVS was placed in packet for [facility]. Report called to [facility], RN "Alycia Rossetti". Pt IV's removed. Pt placed in paper gown. All belongings collected and given to [facility]. Pt has no questions or concerns. PTAR on floor to transfer pt.

## 2019-12-20 NOTE — TOC Transition Note (Signed)
Transition of Care [facility]) - CM/SW Discharge Note   Patient Details  Name: Johnathan Mitchell. MRN: [id number] Date of Birth: 1934/07/01  [facility] of [facility]) CM/SW Contact:  Lanier Clam, RN Phone Number: 12/20/2019, 11:23 AM   Clinical Narrative:  D/c today [facility]-going to rm#[id number],tel# for nsg report [phone]. PTAR called.covid neg 11/19. No further CM needs.     Final next level of care: Skilled Nursing Facility Barriers to Discharge: No Barriers Identified   Patient Goals and CMS Choice Patient states their goals for this hospitalization and ongoing recovery are:: go to rehab CMS Medicare.gov Compare Post Acute Care list provided to:: Patient Represenative (must comment) (Dtr-Jamie) Choice offered to / list presented to : Adult Children  Discharge Placement   Existing PASRR number confirmed : 12/16/19          Patient [facility] bed at: [facility] Patient to be transferred to facility by: PTAR Name of family member notified: Asher Muir dtr [phone] Patient and family notified of of transfer: 12/20/19  Discharge Plan and Services   Discharge Planning Services: CM Consult Post Acute Care Choice: Skilled Nursing Facility                               Social Determinants of Health (SDOH) Interventions     Readmission Risk Interventions No flowsheet data found.

## 2019-12-20 NOTE — Discharge Summary (Signed)
Physician Discharge Summary  Johnathan Mitchell. VZC:[id number] DOB: [birth year] DOA: 12/14/2019  [facility]P: [facility]  Admit date: 12/14/2019 Discharge date: 12/20/2019  Admitted From: Home Disposition: SNF  Recommendations for Outpatient Follow-up:  1. Follow ups as below. 2. Please obtain CBC/BMP/Mag at follow up 3. Please follow up on the following pending results: None  Discharge Condition: Stable CODE STATUS: Full code   Follow-up Information    Altamese Avalon, MD. Schedule an appointment as soon as possible for a visit in 2 week(s).   Specialty: Orthopedic Surgery Contact information: [place] [address] [phone]              Hospital Course: 85 year old male with history of embolic CVA with left hemiparesis, dementia, seizure disorder, HTN and asthma brought to ED by EMS due to left hand swelling, tenderness and weakness for few weeks with associated poor oral intake and altered mental status.  Reportedly seen at [facility] where he had "negative x-ray" and was put in brace.  He also had low abdominal pain and back pain recently.  In ED, left wrist and hand x-ray without displaced fracture or dislocation but severe radiocarpal and carpal arthrosis with subchondral lucencies suggestive for erosive arthropathy such as gout and diffuse soft tissue edema about the hand and wrist.  Pelvic x-ray was notable for comminuted left acetabular fracture.  Orthopedic surgery consulted and recommended TDWB LLE and outpatient follow-up with Dr. Marcelino Scot in 2 to 3 weeks.  He was a started on colchicine for his left wrist pain for possible gout flareup.  Patient was evaluated by therapy who recommended SNF.  See individual problem list below for more on hospital course.  Discharge Diagnoses:  Closed left acetabular fracture [facility]):  -Touchdown weightbearing on LLE and outpatient follow-up with Dr. Halford Decamp in 2 to 3 weeks to assess healing. -Pain control with  scheduled Tylenol 500 mg every 8 hours -Continue therapy  Acute gout flareup of left wrist: Patient with elevated ESR and CRP.  Left wrist and hand x-ray suggestive for acute gout flareup.  He has some flexion deformity, tenderness and increased warmth to touch.  Some improvement with IV steroid. -Continue colchicine 0.6 mg daily -Consider outpatient follow-up with rheumatology.  SIRS: Resolved.  Sepsis and cellulitis ruled out.  Essential hypertension: BP slightly elevated. -Increase amlodipine to 10 mg daily  Dementia with behavioral disturbance ([place]): Oriented x2 -Reorientation and delirium precautions. -Minimize or avoid sedating medications.  History of seizures ([place]): Does not take Depakote or Vimpat at home per daughter.. -Encouraged patient's daughter to discuss this with his PCP of prescribers  Mood disorder: Stable.  Does not take Depakote or Lexapro per daughter. -Outpatient follow-up  History of CVA with left hemiparesis: Patient is not on anticoagulation but on aspirin. -Continue low-dose aspirin -Continue home statin.  Debility/physical deconditioning -Continue PT/OT at SNF.  Moderate malnutrition Body mass index is 22.63 kg/m. Nutrition Problem: Increased nutrient needs Etiology: acute illness Signs/Symptoms: estimated needs Interventions: Other (Comment) (prosource plus)      Discharge Exam: Vitals:   12/20/19 0436 12/20/19 0913  BP: (!) 155/101 (!) 154/99  Pulse: 78 94  Resp: 16 16  Temp: 98.4 F (36.9 C) 97.8 F (36.6 C)  SpO2: 98% 99%    GENERAL: No apparent distress.  Nontoxic. HEENT: MMM.  Vision and hearing grossly intact.  NECK: Supple.  No apparent JVD.  RESP: On RA.  No IWOB.  Fair aeration bilaterally. CVS:  RRR. Heart sounds normal.  ABD/GI/GU: Bowel sounds present. Soft. Non tender.  MSK/EXT:  Moves extremities.  Left hemiparesis with flexion deformity at left wrist SKIN: no apparent skin lesion or wound NEURO: Awake and  alert.  Oriented to self and place only.  Left hemiparesis. PSYCH: Calm. Normal affect.  Discharge Instructions  Discharge Instructions    Diet general   Complete by: As directed    Increase activity slowly   Complete by: As directed      Allergies as of 12/20/2019      Reactions   Penicillins Hives, Swelling   Patient couldn't answer any questions      Medication List    STOP taking these medications   ascorbic acid 500 MG tablet Commonly known as: VITAMIN C   aspirin 325 MG tablet Replaced by: aspirin EC 81 MG tablet   calcium citrate 950 (200 Ca) MG tablet Commonly known as: CALCITRATE - dosed in mg elemental calcium   Cholecalciferol 50 MCG (2000 UT) Tabs   divalproex 500 MG 24 hr tablet Commonly known as: DEPAKOTE ER   docusate sodium 100 MG capsule Commonly known as: COLACE   escitalopram 10 MG tablet Commonly known as: LEXAPRO   meloxicam 7.5 MG tablet Commonly known as: MOBIC   mupirocin cream 2 % Commonly known as: BACTROBAN   Vimpat 100 MG Tabs Generic drug: Lacosamide     TAKE these medications   (feeding supplement) PROSource Plus liquid Take 30 mLs by mouth daily. Start taking on: December 21, 2019   acetaminophen 500 MG tablet Commonly known as: TYLENOL Take 1 tablet (500 mg total) by mouth every 8 (eight) hours. What changed:   medication strength  how much to take  when to take this  reasons to take this  Another medication with the same name was removed. Continue taking this medication, and follow the directions you see here.   amLODipine 10 MG tablet Commonly known as: NORVASC Take 1 tablet (10 mg total) by mouth daily. Start taking on: December 21, 2019   aspirin EC 81 MG tablet Take 1 tablet (81 mg total) by mouth daily. Swallow whole. Replaces: aspirin 325 MG tablet   atorvastatin 80 MG tablet Commonly known as: LIPITOR Take 1 tablet (80 mg total) by mouth daily at 6 PM.   bisacodyl 5 MG EC tablet Commonly known  as: DULCOLAX Take 1 tablet (5 mg total) by mouth daily as needed for moderate constipation.   colchicine 0.6 MG tablet Take 1 tablet (0.6 mg total) by mouth daily. Start taking on: December 20, 100   HYDROPHILIC EX Apply 1 application topically 2 (two) times daily as needed (moiturizer).   MENTHOL-METHYL SALICYLATE EX Apply 1 application topically 4 (four) times daily as needed (pain).   senna-docusate 8.6-50 MG tablet Commonly known as: Senokot-S Take 1 tablet by mouth 2 (two) times daily.       Consultations:  None  Procedures/Studies:   DG Chest 1 View  Result Date: 12/14/2019 CLINICAL DATA:  Altered mental status. EXAM: CHEST  1 VIEW COMPARISON:  June 13 21. FINDINGS: Similar cardiac silhouette. No confluent consolidation. No visible pleural effusions or pneumothorax. Advanced bilateral shoulder degenerative change. Multilevel degenerative change of the thoracic spine, suboptimally evaluated without a lateral radiograph. IMPRESSION: No evidence of acute cardiopulmonary disease. Electronically Signed   By: Margaretha Sheffield MD   On: 12/14/2019 12:28   DG Wrist Complete Left  Result Date: 12/14/2019 CLINICAL DATA:  Altered mental status, pain EXAM: LEFT WRIST - COMPLETE 3+  VIEW COMPARISON:  None. FINDINGS: No displaced fracture or dislocation of the left wrist. Assessment of the carpus is limited by contracted, flexed position of the hand. Within this limitation, there is severe radiocarpal and carpal arthrosis with subchondral lucencies. The carpus is normally aligned. Diffuse soft tissue edema about the hand and wrist. IMPRESSION: 1.  No displaced fracture or dislocation of the left wrist 2. Assessment of the carpus is limited by contracted, flexed position of the hand. Within this limitation, there is severe radiocarpal and carpal arthrosis with subchondral lucencies suggestive of erosive arthropathy such as gout. 3.  Diffuse soft tissue edema about the hand and wrist.  Electronically Signed   By: Eddie Candle M.D.   On: 12/14/2019 12:37   CT Head Wo Contrast  Result Date: 12/14/2019 CLINICAL DATA:  Delirium. Additional history provided: Weakness, pain with movement. EXAM: CT HEAD WITHOUT CONTRAST TECHNIQUE: Contiguous axial images were obtained from the base of the skull through the vertex without intravenous contrast. COMPARISON:  Prior head CT 08/10/2019.  Brain MRI 02/01/2014. FINDINGS: Brain: Moderate generalized cerebral atrophy. Redemonstrated chronic infarct within the left corona radiata/basal ganglia. Additional redemonstrated small chronic infarcts within the deep gray nuclei. Background advanced ill-defined hypoattenuation within the cerebral white matter is nonspecific, but compatible with chronic small vessel ischemic disease. There is no acute intracranial hemorrhage. No demarcated cortical infarct. No extra-axial fluid collection. No evidence of intracranial mass. No midline shift. Vascular: No hyperdense vessel.  Atherosclerotic calcifications. Skull: Normal. Negative for fracture or focal lesion. Sinuses/Orbits: Visualized orbits show no acute finding. Chronic medially displaced deformity of the left lamina papyracea. No significant paranasal sinus disease at the imaged levels. IMPRESSION: No evidence of acute intracranial abnormality. Redemonstrated severe chronic small vessel ischemic disease with multiple chronic small-vessel infarcts. Stable, moderate cerebral atrophy. Electronically Signed   By: Kellie Simmering DO   On: 12/14/2019 14:47   CT ABDOMEN PELVIS W CONTRAST  Result Date: 12/14/2019 CLINICAL DATA:  Abdominal pain EXAM: CT ABDOMEN AND PELVIS WITH CONTRAST TECHNIQUE: Multidetector CT imaging of the abdomen and pelvis was performed using the standard protocol following bolus administration of intravenous contrast. CONTRAST:  171m OMNIPAQUE IOHEXOL 300 MG/ML  SOLN COMPARISON:  None. FINDINGS: Lower chest: The visualized heart size within normal  limits. Coronary artery calcifications are seen. No pericardial fluid/thickening. No hiatal hernia. Streaky atelectasis seen at both lung bases. Hepatobiliary: The liver is normal in density without focal abnormality.The main portal vein is patent. No evidence of calcified gallstones, gallbladder wall thickening or biliary dilatation. Pancreas: Unremarkable. No pancreatic ductal dilatation or surrounding inflammatory changes. Spleen: Normal in size without focal abnormality. Adrenals/Urinary Tract: Both adrenal glands appear normal. The kidneys and collecting system appear normal without evidence of urinary tract calculus or hydronephrosis. Bladder is unremarkable. Stomach/Bowel: The stomach, small bowel, and colon are normal in appearance. No inflammatory changes, wall thickening, or obstructive findings.Moderate stool is present throughout. Vascular/Lymphatic: There are no enlarged mesenteric, retroperitoneal, or pelvic lymph nodes. Scattered aortic atherosclerotic calcifications are seen without aneurysmal dilatation. Again noted is aneurysmal dilatation with partial thrombosis of bilateral iliac vasculature. Reproductive: The prostate is unremarkable. Other: No evidence of abdominal wall mass or hernia. Musculoskeletal: There is a comminuted mildly displaced fracture seen through the anterior and posterior left acetabulum which involve the wall and column. The patient is status post ORIF with intramedullary nail fixation of the left femur. 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 IMPRESSION: Comminuted mildly displaced fractures through the anterior and posterior left acetabulum. Aortic  Atherosclerosis (ICD10-I70.0). 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 Electronically Signed   By: Prudencio Pair M.D.   On: 12/14/2019 17:26   DG Pelvis Comp Min 3V  Result Date: 12/14/2019 CLINICAL DATA:  Acetabular fractures. EXAM: JUDET PELVIS - 3+ VIEW COMPARISON:  CT scan, same date. FINDINGS:  Complex comminuted left acetabular fractures are again demonstrated. The humeral head is normally located. Intramedullary gamma nail and dynamic hip screw are in place along with a proximal cerclage wire. The right hip demonstrates severe degenerative changes and mild protrusio. IMPRESSION: Complex comminuted left acetabular fractures. Electronically Signed   By: Marijo Sanes M.D.   On: 12/14/2019 19:41   DG Hand Complete Left  Result Date: 12/14/2019 CLINICAL DATA:  Left hand and wrist pain.  Nonverbal. EXAM: LEFT HAND - COMPLETE 3+ VIEW COMPARISON:  Left wrist radiographs obtained at the same time. FINDINGS: Diffuse swelling of the hand. Mild carpal degenerative changes. Moderate distal radioulnar joint degenerative spur formation. No fracture, dislocation, bone destruction, periosteal reaction or soft tissue gas. IMPRESSION: 1. Diffuse swelling of the hand without fracture. 2. Wrist degenerative changes. Electronically Signed   By: Claudie Revering M.D.   On: 12/14/2019 12:31       The results of significant diagnostics from this hospitalization (including imaging, microbiology, ancillary and laboratory) are listed below for reference.     Microbiology: Recent Results (from the past 240 hour(s))  Culture, blood (routine x 2)     Status: None   Collection Time: 12/14/19 12:48 PM   Specimen: BLOOD  Result Value Ref Range Status   Specimen Description   Final    BLOOD RIGHT ARM Performed at [facility] [address], [place], [place] [address]    Special Requests   Final    BOTTLES DRAWN AEROBIC AND ANAEROBIC Blood Culture results may not be optimal due to an inadequate volume of blood received in culture bottles Performed at [facility] [address] [place], [place] [address]    Culture   Final    NO GROWTH 5 DAYS Performed at [facility], [place] [address] [place], [place] [address]    Report Status 12/19/2019 FINAL  Final  Culture,  blood (routine x 2)     Status: None   Collection Time: 12/14/19  1:45 PM   Specimen: BLOOD  Result Value Ref Range Status   Specimen Description   Final    BLOOD LEFT ARM Performed at [facility] [address] [place], [place] [address]    Special Requests   Final    BOTTLES DRAWN AEROBIC AND ANAEROBIC Blood Culture results may not be optimal due to an inadequate volume of blood received in culture bottles Performed at [facility] [address], [place], [place] [address]    Culture   Final    NO GROWTH 5 DAYS Performed at [facility], [place] [address] [place], [place] [address]    Report Status 12/19/2019 FINAL  Final  Urine culture     Status: None   Collection Time: 12/14/19  5:00 PM   Specimen: Urine, Random  Result Value Ref Range Status   Specimen Description   Final    URINE, RANDOM Performed at [facility] [address] [place], [place] [address]    Special Requests   Final    NONE Performed at [facility], [place] [address], [place], [place] [address]  Culture   Final    NO GROWTH Performed at [facility], [place] [address], [place], [place] [address]    Report Status 12/16/2019 FINAL  Final  Respiratory Panel by RT PCR (Flu A&B, Covid) - Nasopharyngeal Swab     Status: None   Collection Time: 12/14/19  6:32 PM   Specimen: Nasopharyngeal Swab  Result Value Ref Range Status   SARS Coronavirus 2 by RT PCR NEGATIVE NEGATIVE Final    Comment: (NOTE) SARS-CoV-2 target nucleic acids are NOT DETECTED.  The SARS-CoV-2 RNA is generally detectable in upper respiratoy specimens during the acute phase of infection. The lowest concentration of SARS-CoV-2 viral copies this assay can detect is 131 copies/mL. A negative result does not preclude SARS-Cov-2 infection and should not be used as the sole basis for treatment or other patient management decisions. A negative result may  occur with  improper specimen collection/handling, submission of specimen other than nasopharyngeal swab, presence of viral mutation(s) within the areas targeted by this assay, and inadequate number of viral copies (<131 copies/mL). A negative result must be combined with clinical observations, patient history, and epidemiological information. The expected result is Negative.  Fact Sheet for Patients:  PinkCheek.be  Fact Sheet for Healthcare Providers:  GravelBags.it  This test is no t yet approved or cleared by the [place] FDA and  has been authorized for detection and/or diagnosis of SARS-CoV-2 by FDA under an Emergency Use Authorization (EUA). This EUA will remain  in effect (meaning this test can be used) for the duration of the COVID-19 declaration under Section 564(b)(1) of the Act, 21 U.S.C. section 360bbb-3(b)(1), unless the authorization is terminated or revoked sooner.     Influenza A by PCR NEGATIVE NEGATIVE Final   Influenza B by PCR NEGATIVE NEGATIVE Final    Comment: (NOTE) The Xpert Xpress SARS-CoV-2/FLU/RSV assay is intended as an aid in  the diagnosis of influenza from Nasopharyngeal swab specimens and  should not be used as a sole basis for treatment. Nasal washings and  aspirates are unacceptable for Xpert Xpress SARS-CoV-2/FLU/RSV  testing.  Fact Sheet for Patients: PinkCheek.be  Fact Sheet for Healthcare Providers: GravelBags.it  This test is not yet approved or cleared by the [place] FDA and  has been authorized for detection and/or diagnosis of SARS-CoV-2 by  FDA under an Emergency Use Authorization (EUA). This EUA will remain  in effect (meaning this test can be used) for the duration of the  Covid-19 declaration under Section 564(b)(1) of the Act, 21  U.S.C. section 360bbb-3(b)(1), unless the authorization is  terminated or  revoked. Performed at [facility], [place] [address], [place], [place] [address]   SARS Coronavirus 2 by RT PCR (hospital order, performed in [facility] hospital lab) Nasopharyngeal Nasopharyngeal Swab     Status: None   Collection Time: 12/17/19  2:20 PM   Specimen: Nasopharyngeal Swab  Result Value Ref Range Status   SARS Coronavirus 2 NEGATIVE NEGATIVE Final    Comment: (NOTE) SARS-CoV-2 target nucleic acids are NOT DETECTED.  The SARS-CoV-2 RNA is generally detectable in upper and lower respiratory specimens during the acute phase of infection. The lowest concentration of SARS-CoV-2 viral copies this assay can detect is 250 copies / mL. A negative result does not preclude SARS-CoV-2 infection and should not be used as the sole basis for treatment or other patient management decisions.  A negative result may occur with improper specimen collection / handling, submission of specimen other than  nasopharyngeal swab, presence of viral mutation(s) within the areas targeted by this assay, and inadequate number of viral copies (<250 copies / mL). A negative result must be combined with clinical observations, patient history, and epidemiological information.  Fact Sheet for Patients:   StrictlyIdeas.no  Fact Sheet for Healthcare Providers: BankingDealers.co.za  This test is not yet approved or  cleared by the [place] FDA and has been authorized for detection and/or diagnosis of SARS-CoV-2 by FDA under an Emergency Use Authorization (EUA).  This EUA will remain in effect (meaning this test can be used) for the duration of the COVID-19 declaration under Section 564(b)(1) of the Act, 21 U.S.C. section 360bbb-3(b)(1), unless the authorization is terminated or revoked sooner.  Performed at [facility], [place] [address], [place], [place] [address]      Labs: BNP (last 3 results) Recent Labs     07/11/19 2039  BNP 09.6   Basic Metabolic Panel: Recent Labs  Lab 12/14/19 1115 12/15/19 0540 12/16/19 0534 12/18/19 0611  NA 138 135 137 135  K 4.0 4.4 4.1 3.8  CL 98 103 103 102  CO2 27 24 22 23   GLUCOSE 110* 93 128* 92  BUN 23 22 24* 18  CREATININE 1.21 1.14 0.97 0.63  CALCIUM 10.1 8.6* 8.5* 8.6*   Liver Function Tests: Recent Labs  Lab 12/14/19 1115 12/15/19 0540 12/16/19 0534 12/18/19 0611  AST 25 26 23 16   ALT 24 16 15 14   ALKPHOS 76 57 54 57  BILITOT 0.8 1.2 0.6 0.4  PROT 8.9* 6.5 6.6 6.4*  ALBUMIN 3.5 2.4* 2.5* 2.5*   Recent Labs  Lab 12/14/19 1115  LIPASE 27   No results for input(s): AMMONIA in the last 168 hours. CBC: Recent Labs  Lab 12/14/19 1115 12/15/19 0540 12/16/19 0534 12/18/19 0611 12/19/19 1129  WBC 14.1* 9.9 12.2* 13.9* 7.7  NEUTROABS 12.6* 7.9*  --   --  4.9  HGB 13.0 10.7* 10.3* 10.5* 12.8*  HCT 40.7 34.8* 33.3* 33.6* 41.2  MCV 81.7 84.9 84.3 83.0 84.4  PLT 238 173 173 186 191   Cardiac Enzymes: No results for input(s): CKTOTAL, CKMB, CKMBINDEX, TROPONINI in the last 168 hours. BNP: Invalid input(s): POCBNP CBG: Recent Labs  Lab 12/14/19 1125  GLUCAP 98   D-Dimer No results for input(s): DDIMER in the last 72 hours. Hgb A1c No results for input(s): HGBA1C in the last 72 hours. Lipid Profile No results for input(s): CHOL, HDL, LDLCALC, TRIG, CHOLHDL, LDLDIRECT in the last 72 hours. Thyroid function studies No results for input(s): TSH, T4TOTAL, T3FREE, THYROIDAB in the last 72 hours.  Invalid input(s): FREET3 Anemia work up No results for input(s): VITAMINB12, FOLATE, FERRITIN, TIBC, IRON, RETICCTPCT in the last 72 hours. Urinalysis    Component Value Date/Time   COLORURINE YELLOW 12/14/2019 1700   APPEARANCEUR HAZY (A) 12/14/2019 1700   LABSPEC 1.040 (H) 12/14/2019 1700   PHURINE 5.0 12/14/2019 1700   GLUCOSEU NEGATIVE 12/14/2019 1700   HGBUR MODERATE (A) 12/14/2019 1700   BILIRUBINUR NEGATIVE 12/14/2019 1700    KETONESUR NEGATIVE 12/14/2019 1700   PROTEINUR 30 (A) 12/14/2019 1700   UROBILINOGEN 1.0 02/01/2014 1507   NITRITE NEGATIVE 12/14/2019 1700   LEUKOCYTESUR NEGATIVE 12/14/2019 1700   Sepsis Labs Invalid input(s): PROCALCITONIN,  WBC,  LACTICIDVEN   Time coordinating discharge: 35 minutes  SIGNED:  Mercy Riding, MD  [facility] 12/20/2019, 10:55 AM  If 7PM-7AM, please contact night-coverage [email or web address]

## 2019-12-30 DIAGNOSIS — F39 Unspecified mood [affective] disorder: Secondary | ICD-10-CM | POA: Diagnosis not present

## 2019-12-30 DIAGNOSIS — M109 Gout, unspecified: Secondary | ICD-10-CM | POA: Diagnosis not present

## 2019-12-30 DIAGNOSIS — S32409A Unspecified fracture of unspecified acetabulum, initial encounter for closed fracture: Secondary | ICD-10-CM | POA: Diagnosis not present

## 2019-12-30 DIAGNOSIS — F0150 Vascular dementia without behavioral disturbance: Secondary | ICD-10-CM | POA: Diagnosis not present

## 2019-12-30 DIAGNOSIS — R269 Unspecified abnormalities of gait and mobility: Secondary | ICD-10-CM | POA: Diagnosis not present

## 2019-12-30 DIAGNOSIS — I10 Essential (primary) hypertension: Secondary | ICD-10-CM | POA: Diagnosis not present

## 2019-12-30 DIAGNOSIS — I69359 Hemiplegia and hemiparesis following cerebral infarction affecting unspecified side: Secondary | ICD-10-CM | POA: Diagnosis not present

## 2019-12-30 DIAGNOSIS — R531 Weakness: Secondary | ICD-10-CM | POA: Diagnosis not present

## 2019-12-30 DIAGNOSIS — E46 Unspecified protein-calorie malnutrition: Secondary | ICD-10-CM | POA: Diagnosis not present

## 2020-02-03 ENCOUNTER — Encounter (HOSPITAL_COMMUNITY): Payer: Self-pay | Admitting: Emergency Medicine

## 2020-02-03 ENCOUNTER — Other Ambulatory Visit: Payer: Self-pay

## 2020-02-03 ENCOUNTER — Emergency Department (HOSPITAL_COMMUNITY): Payer: No Typology Code available for payment source

## 2020-02-03 ENCOUNTER — Emergency Department (HOSPITAL_COMMUNITY)
Admission: EM | Admit: 2020-02-03 | Discharge: 2020-02-04 | Disposition: A | Discharge status: Home / Self Care | Payer: No Typology Code available for payment source | Attending: Emergency Medicine | Admitting: Emergency Medicine

## 2020-02-03 DIAGNOSIS — Z87891 Personal history of nicotine dependence: Secondary | ICD-10-CM | POA: Insufficient documentation

## 2020-02-03 DIAGNOSIS — M79642 Pain in left hand: Secondary | ICD-10-CM

## 2020-02-03 DIAGNOSIS — F0391 Unspecified dementia with behavioral disturbance: Secondary | ICD-10-CM | POA: Insufficient documentation

## 2020-02-03 DIAGNOSIS — Z7982 Long term (current) use of aspirin: Secondary | ICD-10-CM | POA: Diagnosis not present

## 2020-02-03 DIAGNOSIS — Z20822 Contact with and (suspected) exposure to covid-19: Secondary | ICD-10-CM | POA: Diagnosis not present

## 2020-02-03 DIAGNOSIS — M25552 Pain in left hip: Secondary | ICD-10-CM

## 2020-02-03 DIAGNOSIS — I69354 Hemiplegia and hemiparesis following cerebral infarction affecting left non-dominant side: Secondary | ICD-10-CM | POA: Diagnosis not present

## 2020-02-03 DIAGNOSIS — I10 Essential (primary) hypertension: Secondary | ICD-10-CM | POA: Diagnosis not present

## 2020-02-03 DIAGNOSIS — N3000 Acute cystitis without hematuria: Secondary | ICD-10-CM

## 2020-02-03 DIAGNOSIS — J45909 Unspecified asthma, uncomplicated: Secondary | ICD-10-CM | POA: Diagnosis not present

## 2020-02-03 DIAGNOSIS — Z79899 Other long term (current) drug therapy: Secondary | ICD-10-CM | POA: Insufficient documentation

## 2020-02-03 LAB — CBC WITH DIFFERENTIAL/PLATELET
Abs Immature Granulocytes: 0.08 10*3/uL — ABNORMAL HIGH (ref 0.00–0.07)
Basophils Absolute: 0 10*3/uL (ref 0.0–0.1)
Basophils Relative: 0 %
Eosinophils Absolute: 0.1 10*3/uL (ref 0.0–0.5)
Eosinophils Relative: 1 %
HCT: 37.6 % — ABNORMAL LOW (ref 39.0–52.0)
Hemoglobin: 12 g/dL — ABNORMAL LOW (ref 13.0–17.0)
Immature Granulocytes: 1 %
Lymphocytes Relative: 10 %
Lymphs Abs: 1.3 10*3/uL (ref 0.7–4.0)
MCH: 26.1 pg (ref 26.0–34.0)
MCHC: 31.9 g/dL (ref 30.0–36.0)
MCV: 81.9 fL (ref 80.0–100.0)
Monocytes Absolute: 0.8 10*3/uL (ref 0.1–1.0)
Monocytes Relative: 6 %
Neutro Abs: 10.6 10*3/uL — ABNORMAL HIGH (ref 1.7–7.7)
Neutrophils Relative %: 82 %
Platelets: 283 10*3/uL (ref 150–400)
RBC: 4.59 MIL/uL (ref 4.22–5.81)
RDW: 16.8 % — ABNORMAL HIGH (ref 11.5–15.5)
WBC: 12.9 10*3/uL — ABNORMAL HIGH (ref 4.0–10.5)
nRBC: 0 % (ref 0.0–0.2)

## 2020-02-03 LAB — URINALYSIS, ROUTINE W REFLEX MICROSCOPIC
Bilirubin Urine: NEGATIVE
Glucose, UA: NEGATIVE mg/dL
Hgb urine dipstick: NEGATIVE
Ketones, ur: NEGATIVE mg/dL
Nitrite: NEGATIVE
Protein, ur: NEGATIVE mg/dL
Specific Gravity, Urine: 1.017 (ref 1.005–1.030)
WBC, UA: >50 WBC/hpf — ABNORMAL HIGH (ref 0–5)
pH: 6 (ref 5.0–8.0)

## 2020-02-03 MED ORDER — ACETAMINOPHEN 325 MG PO TABS
650.0000 mg | ORAL_TABLET | Freq: Once | ORAL | Status: AC
  Administered 2020-02-03: 650 mg via ORAL
  Filled 2020-02-03: qty 2

## 2020-02-03 NOTE — ED Triage Notes (Signed)
Patient presents from home post EMS being called out for a welfare check. When EMS arrived, patient complained of left side and left wrist pain. EMS also noted a fever. He arrived home from rehab post pelvis fracture 5 days ago and has had pain since. EMS administered 1000 of Tylenol. HX Gout  EMS vitals: 124/84 BP 90 HR 30 Resp Rate 100.1 Temp  97% SPO2 on room air

## 2020-02-03 NOTE — ED Provider Notes (Signed)
[facility]-EMERGENCY DEPT Provider Note   CSN: [id number] Arrival date & time: 02/03/20  2018     History Chief Complaint  Patient presents with  . left side pain  . Wrist Pain    Johnathan Mitchell. is a 86 y.o. male with a past medical history significant for asthma, dementia, hypertension, seizures, history of CVA with residual left-sided hemiparesis who presents to the ED due to left-sided pain.  Patient has a history of dementia.  Level 5 caveat.  Spoke to daughter Johnathan Mitchell on the phone who notes patient has been groaning in pain for the past day.  Daughter notes that patient is at baseline.  Chart reviewed.  Patient was recently admitted to the hospital 11/16-11/22 for a left acetabular fracture and what was believed to be a left wrist gout flare.  Patient discharged to SNF however, daughter bought patient home on 01/29/2020 to live with her. Daughter notes that OT came to the house today and patient continued to groan in pain, so daughter called EMS to transport him to the hospital for evaluation. Patient is wheelchair bound and needs assistance with ADLs at baseline.   Level 5 caveat secondary to history of dementia.    Past Medical History:  Diagnosis Date  . Asthma   . Dementia (HCC)   . Hypertension   . Seizures (HCC)   . Stroke [facility])     Patient Active Problem List   Diagnosis Date Noted  . Cellulitis of left hand 12/15/2019  . Wrist pain 12/15/2019  . Closed left acetabular fracture (HCC) 12/14/2019  . SIRS (systemic inflammatory response syndrome) (HCC) 12/14/2019  . Protein-calorie malnutrition (HCC) 05/20/2017  . TIA (transient ischemic attack) 05/20/2017  . 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 Seizures (HCC) 05/19/2017  . AKI (acute kidney injury) (HCC) 05/12/2017  . Stroke (HCC) 05/11/2017  . Femur fracture (HCC) 05/11/2017  . Chest pain 02/09/2016  . HTN (hypertension) 02/09/2016  . Dementia with behavioral disturbance (HCC)  02/09/2016  . Chest pain at rest 02/09/2016  . Cerebral infarction (HCC) 02/01/2014  . Benign essential HTN 02/01/2014    Past Surgical History:  Procedure Laterality Date  . BACK SURGERY    . FEMUR IM NAIL Left 05/13/2017   Procedure: INTRAMEDULLARY (IM) NAIL FEMORAL;  Surgeon: [name], MD;  Location: [facility] OR;  Service: Orthopedics;  Laterality: Left;  . KNEE SURGERY         Family History  Problem Relation Age of Onset  . Hyperlipidemia Mother   . Hypertension Mother     Social History   Tobacco Use  . Smoking status: Former Games developer  . Smokeless tobacco: Never Used  Vaping Use  . Vaping Use: Never used  Substance Use Topics  . Alcohol use: No  . Drug use: No    Home Medications Prior to Admission medications   Medication Sig Start Date End Date Taking? Authorizing Provider  acetaminophen (TYLENOL) 500 MG tablet Take 1 tablet (500 mg total) by mouth every 8 (eight) hours. 12/20/19   Almon Hercules, MD  amLODipine (NORVASC) 10 MG tablet Take 1 tablet (10 mg total) by mouth daily. 12/21/19   Almon Hercules, MD  aspirin EC 81 MG tablet Take 1 tablet (81 mg total) by mouth daily. Swallow whole. 12/20/19 12/19/20  Almon Hercules, MD  atorvastatin (LIPITOR) 80 MG tablet Take 1 tablet (80 mg total) by mouth daily at 6 PM. 08/20/17   Medina-Vargas, Margit Banda, NP  bisacodyl (DULCOLAX) 5 MG EC tablet Take 1 tablet (5 mg total) by mouth daily as needed for moderate constipation. 12/20/19   Almon Hercules, MD  colchicine 0.6 MG tablet Take 1 tablet (0.6 mg total) by mouth daily. 12/21/19   Almon Hercules, MD  HYDROPHILIC EX Apply 1 application topically 2 (two) times daily as needed (moiturizer).    [provider]  MENTHOL-METHYL SALICYLATE EX Apply 1 application topically 4 (four) times daily as needed (pain).    [provider]  Nutritional Supplements (,FEEDING SUPPLEMENT, PROSOURCE PLUS) liquid Take 30 mLs by mouth daily. 12/21/19   Almon Hercules, MD   senna-docusate (SENOKOT-S) 8.6-50 MG tablet Take 1 tablet by mouth 2 (two) times daily. 12/20/19   Almon Hercules, MD    Allergies    Penicillins  Review of Systems   Review of Systems  Unable to perform ROS: Dementia    Physical Exam Updated Vital Signs BP (!) 116/96 (BP Location: Right Arm)   Pulse 95   Temp 100 F (37.8 C) (Rectal)   Resp 15   Ht 5\' 6"  (1.676 m)   Wt 68 kg   SpO2 98%   BMI 24.21 kg/m   Physical Exam Vitals and nursing note reviewed.  Constitutional:      General: He is not in acute distress.    Appearance: He is not toxic-appearing.  HENT:     Head: Normocephalic.  Eyes:     Pupils: Pupils are equal, round, and reactive to light.  Neck:     Comments: No cervical midline tenderness. Cardiovascular:     Rate and Rhythm: Normal rate and regular rhythm.     Pulses: Normal pulses.     Heart sounds: Normal heart sounds. No murmur heard. No friction rub. No gallop.   Pulmonary:     Effort: Pulmonary effort is normal.     Breath sounds: Normal breath sounds.  Abdominal:     General: Abdomen is flat. Bowel sounds are normal. There is no distension.     Palpations: Abdomen is soft.     Tenderness: There is no abdominal tenderness. There is no guarding or rebound.  Musculoskeletal:     Cervical back: Neck supple.     Comments: Tenderness, erythematous left hand/wrist. Radial pulse intact. Bony tenderness over left hip. Able to move all extremities without difficulty.  Skin:    General: Skin is warm and dry.  Neurological:     Mental Status: He is alert. Mental status is at baseline.  Psychiatric:        Mood and Affect: Mood normal.        Behavior: Behavior normal.     ED Results / Procedures / Treatments   Labs (all labs ordered are listed, but only abnormal results are displayed) Labs Reviewed  CBC WITH DIFFERENTIAL/PLATELET - Abnormal; Notable for the following components:      Result Value   WBC 12.9 (*)    Hemoglobin 12.0 (*)    HCT  37.6 (*)    RDW 16.8 (*)    Neutro Abs 10.6 (*)    Abs Immature Granulocytes 0.08 (*)    All other components within normal limits  RESP PANEL BY RT-PCR (FLU A&B, COVID) ARPGX2  COMPREHENSIVE METABOLIC PANEL  URINALYSIS, ROUTINE W REFLEX MICROSCOPIC  LACTIC ACID, PLASMA    EKG None  Radiology DG Chest Port 1 View  Result Date: 02/03/2020 CLINICAL DATA:  86 year old male with fever. EXAM: PORTABLE CHEST 1  VIEW COMPARISON:  Chest radiograph dated 12/14/2019. FINDINGS: Shallow inspiration. No focal consolidation, pleural effusion or pneumothorax. Stable cardiac silhouette. No acute osseous pathology. Osteopenia with degenerative changes of the spine and shoulders. IMPRESSION: No active disease. Electronically Signed   By: Elgie Collard M.D.   On: 02/03/2020 22:40   DG Hand Complete Left  Result Date: 02/03/2020 CLINICAL DATA:  Fever with left-side and left wrist pain. EXAM: LEFT HAND - COMPLETE 3+ VIEW COMPARISON:  December 14, 2019 FINDINGS: The study is limited secondary to the flexed position of the left wrist and fingers of the left hand. There is no evidence of an acute fracture or dislocation. Degenerative changes seen involving the radiocarpal articulation and throughout the left wrist. Mild soft tissue swelling is noted. IMPRESSION: 1. Limited study, as described above, without evidence of an acute osseous abnormality. 2. Degenerative changes throughout the left wrist. Electronically Signed   By: Aram Candela M.D.   On: 02/03/2020 21:30   DG Hip Unilat W or Wo Pelvis 2-3 Views Left  Result Date: 02/03/2020 CLINICAL DATA:  Fever with left side and left wrist pain. EXAM: DG HIP (WITH OR WITHOUT PELVIS) 2-3V LEFT COMPARISON:  May 13, 2017 FINDINGS: A radiopaque intramedullary rod and compression screw device are seen within the proximal left femur. There is no evidence of an acute fracture or dislocation. A chronic fracture deformity of the proximal left femoral shaft is seen.  Extensive heterotopic bone formation is seen along the medial aspect of the proximal left femur and above the left greater trochanter. Degenerative changes are seen involving both hips, in the form of joint space narrowing and acetabular sclerosis. This is marked in severity on the right. Soft tissue structures are unremarkable. IMPRESSION: Chronic, degenerative and postoperative changes, as described above, without evidence of an acute osseous abnormality. Electronically Signed   By: Aram Candela M.D.   On: 02/03/2020 21:26    Procedures Procedures (including critical care time)  Medications Ordered in ED Medications  acetaminophen (TYLENOL) tablet 650 mg (650 mg Oral Given 02/03/20 2055)    ED Course  I have reviewed the triage vital signs and the nursing notes.  Pertinent labs & imaging results that were available during my care of the patient were reviewed by me and considered in my medical decision making (see chart for details).    MDM Rules/Calculators/A&P                         86 year old male presents to the ED due to left-sided pain most significant in left hand and left hip.  Patient has a history of dementia.  Patient was recently admitted to the hospital 11/16-11/22 due to a left acetabulum fracture and gout to left wrist. Patient discharged to SNF, but returned back to daughter's house on 01/29/2020.  Upon arrival, stable vitals.  Temperature mildly elevated at 100 F.  Patient nontoxic-appearing.  Physical exam significant for edema and erythema to left hand and wrist.  Left upper extremity neurovascularly intact.  Bony tenderness over left hip.  Given patient's low-grade fever, will obtain routine labs, lactic acid, UA, Covid/influenza test to rule out infectious etiology.  Also obtain left hand x-ray, chest x-ray, and left hip x-ray to rule out bony fractures.  Tylenol given for pain.  Chest x-ray personally reviewed which is negative for signs of infection, pneumothorax, or  widened mediastinum.  Left hand x-ray personally reviewed which is limited due to contracture. Suspect left hand/wrist  pain related to gout. Left hip x-ray personally reviewed which is negative for any acute bony fractures.  Patient handed off to Elpidio Anis, PA-C at shift change pending labs. If labs unremarkable, patient may be discharged home to daughter's with Tylenol for pain. Per chart review, he should be taking colchicine daily for gout.  Discussed case with Dr. Anitra Lauth who evaluated patient at bedside and agrees with assessment and plan.  Final Clinical Impression(s) / ED Diagnoses Final diagnoses:  Left hand pain  Left hip pain    Rx / DC Orders ED Discharge Orders    None       Jesusita Oka 02/03/20 2334    Gwyneth Sprout, MD 02/07/20 2249

## 2020-02-03 NOTE — ED Provider Notes (Signed)
From home --> SNF --> to daughter's on 1st Pain left hip. Left hand, likely chronic Labs secondary to 100.0 PR temp  Plan: if labs normal, can go to home, Tylenol for pain H/o gout to left hand  Review of labs show UA positive for infection. No evidence sepsis. Will start Keflex. Patient is still considered appropriate for discharge home.   Daughter updated on findings. Plan is to discharge home. Daughter reports she is out of town and won't be home until 7:00 am. Requests transportation home as she cannot manage getting him inside the home.   The patient is discharged from the department at 6:15 with plan for [facility] transport to home.       Elpidio Anis, PA-C 02/04/20 2563    Gwyneth Sprout, MD 02/07/20 2340

## 2020-02-04 LAB — COMPREHENSIVE METABOLIC PANEL
ALT: 23 U/L (ref 0–44)
AST: 19 U/L (ref 15–41)
Albumin: 3.1 g/dL — ABNORMAL LOW (ref 3.5–5.0)
Alkaline Phosphatase: 104 U/L (ref 38–126)
Anion gap: 11 (ref 5–15)
BUN: 12 mg/dL (ref 8–23)
CO2: 26 mmol/L (ref 22–32)
Calcium: 9.1 mg/dL (ref 8.9–10.3)
Chloride: 101 mmol/L (ref 98–111)
Creatinine, Ser: 0.68 mg/dL (ref 0.61–1.24)
GFR, Estimated: >60 mL/min (ref >60)
Glucose, Bld: 119 mg/dL — ABNORMAL HIGH (ref 70–99)
Potassium: 3.2 mmol/L — ABNORMAL LOW (ref 3.5–5.1)
Sodium: 138 mmol/L (ref 135–145)
Total Bilirubin: 0.4 mg/dL (ref 0.3–1.2)
Total Protein: 7.5 g/dL (ref 6.5–8.1)

## 2020-02-04 LAB — RESP PANEL BY RT-PCR (FLU A&B, COVID) ARPGX2
Influenza A by PCR: NEGATIVE
Influenza B by PCR: NEGATIVE
SARS Coronavirus 2 by RT PCR: NEGATIVE

## 2020-02-04 LAB — LACTIC ACID, PLASMA: Lactic Acid, Venous: 1.6 mmol/L (ref 0.5–1.9)

## 2020-02-04 MED ORDER — CEPHALEXIN 500 MG PO CAPS
500.0000 mg | ORAL_CAPSULE | Freq: Once | ORAL | Status: AC
  Administered 2020-02-04: 500 mg via ORAL
  Filled 2020-02-04: qty 1

## 2020-02-04 MED ORDER — COLCHICINE 0.6 MG PO TABS: 0.6000 mg | ORAL_TABLET | Freq: Every day | ORAL | 0 refills | Status: DC

## 2020-02-04 MED ORDER — CEPHALEXIN 500 MG PO CAPS: 500.0000 mg | ORAL_CAPSULE | Freq: Four times a day (QID) | ORAL | 0 refills | Status: DC

## 2020-02-04 NOTE — ED Notes (Signed)
Called daughter Marijean Niemann, confirmed she is at home to receive pt.

## 2020-02-04 NOTE — ED Notes (Signed)
[facility] called to inquire about pt status for transport; Unknown ETA at this time.

## 2020-02-04 NOTE — Discharge Instructions (Signed)
You have an infection in your urine that will need antibiotics to treat. Take Keflex as directed and see your doctor for recheck urine test in 1-2 weeks.   The pain in your hand is felt to be recurrent gout arthritis. Continue colchicine as prescribed. A new prescription has been provided in the event you do not have this medication at home.   Return to the emergency department with any new or concerning symptoms at any time.

## 2020-02-04 NOTE — ED Notes (Signed)
[facility] states that pt is still on the list to be transported home; ETA unknown at this time.

## 2020-02-13 ENCOUNTER — Emergency Department (HOSPITAL_COMMUNITY): Payer: No Typology Code available for payment source

## 2020-02-13 ENCOUNTER — Emergency Department (HOSPITAL_COMMUNITY)
Admission: EM | Admit: 2020-02-13 | Discharge: 2020-02-13 | Disposition: A | Discharge status: Home / Self Care | Payer: No Typology Code available for payment source | Attending: Emergency Medicine | Admitting: Emergency Medicine

## 2020-02-13 ENCOUNTER — Other Ambulatory Visit: Payer: Self-pay

## 2020-02-13 DIAGNOSIS — I10 Essential (primary) hypertension: Secondary | ICD-10-CM | POA: Diagnosis not present

## 2020-02-13 DIAGNOSIS — T17320A Food in larynx causing asphyxiation, initial encounter: Secondary | ICD-10-CM

## 2020-02-13 DIAGNOSIS — Z79899 Other long term (current) drug therapy: Secondary | ICD-10-CM | POA: Insufficient documentation

## 2020-02-13 DIAGNOSIS — F0391 Unspecified dementia with behavioral disturbance: Secondary | ICD-10-CM | POA: Insufficient documentation

## 2020-02-13 DIAGNOSIS — G8929 Other chronic pain: Secondary | ICD-10-CM | POA: Insufficient documentation

## 2020-02-13 DIAGNOSIS — M25512 Pain in left shoulder: Secondary | ICD-10-CM | POA: Insufficient documentation

## 2020-02-13 DIAGNOSIS — M79672 Pain in left foot: Secondary | ICD-10-CM | POA: Diagnosis present

## 2020-02-13 DIAGNOSIS — Z7982 Long term (current) use of aspirin: Secondary | ICD-10-CM | POA: Diagnosis not present

## 2020-02-13 DIAGNOSIS — J45909 Unspecified asthma, uncomplicated: Secondary | ICD-10-CM | POA: Diagnosis not present

## 2020-02-13 DIAGNOSIS — Z87891 Personal history of nicotine dependence: Secondary | ICD-10-CM | POA: Insufficient documentation

## 2020-02-13 DIAGNOSIS — R638 Other symptoms and signs concerning food and fluid intake: Secondary | ICD-10-CM | POA: Insufficient documentation

## 2020-02-13 LAB — BASIC METABOLIC PANEL
Anion gap: 8 (ref 5–15)
BUN: 15 mg/dL (ref 8–23)
CO2: 30 mmol/L (ref 22–32)
Calcium: 9.3 mg/dL (ref 8.9–10.3)
Chloride: 103 mmol/L (ref 98–111)
Creatinine, Ser: 0.83 mg/dL (ref 0.61–1.24)
GFR, Estimated: >60 mL/min (ref >60)
Glucose, Bld: 105 mg/dL — ABNORMAL HIGH (ref 70–99)
Potassium: 4.4 mmol/L (ref 3.5–5.1)
Sodium: 141 mmol/L (ref 135–145)

## 2020-02-13 LAB — CBC WITH DIFFERENTIAL/PLATELET
Abs Immature Granulocytes: 0.03 10*3/uL (ref 0.00–0.07)
Basophils Absolute: 0.1 10*3/uL (ref 0.0–0.1)
Basophils Relative: 1 %
Eosinophils Absolute: 0.2 10*3/uL (ref 0.0–0.5)
Eosinophils Relative: 2 %
HCT: 35.3 % — ABNORMAL LOW (ref 39.0–52.0)
Hemoglobin: 11.1 g/dL — ABNORMAL LOW (ref 13.0–17.0)
Immature Granulocytes: 0 %
Lymphocytes Relative: 27 %
Lymphs Abs: 2.2 10*3/uL (ref 0.7–4.0)
MCH: 25.8 pg — ABNORMAL LOW (ref 26.0–34.0)
MCHC: 31.4 g/dL (ref 30.0–36.0)
MCV: 81.9 fL (ref 80.0–100.0)
Monocytes Absolute: 0.8 10*3/uL (ref 0.1–1.0)
Monocytes Relative: 10 %
Neutro Abs: 5 10*3/uL (ref 1.7–7.7)
Neutrophils Relative %: 60 %
Platelets: 238 10*3/uL (ref 150–400)
RBC: 4.31 MIL/uL (ref 4.22–5.81)
RDW: 17.1 % — ABNORMAL HIGH (ref 11.5–15.5)
WBC: 8.3 10*3/uL (ref 4.0–10.5)
nRBC: 0 % (ref 0.0–0.2)

## 2020-02-13 NOTE — Discharge Instructions (Signed)
Our transitional care team will call you at home to help set up home health, PT/OT.

## 2020-02-13 NOTE — ED Triage Notes (Signed)
Arriving via EMS from home. Hx of Gout on the left side, especially affecting his wrist and left leg. Has prior stroke. Was DX with UTI here a week ago, currently has abdominal pain from which he has had no relief since his diagnosis.

## 2020-02-13 NOTE — Progress Notes (Signed)
..   Transition of Care [facility]) - Emergency Department Mini Assessment   Patient Details  Name: Johnathan Mitchell. MRN: [id number] Date of Birth: January 04, 1935  [facility]) CM/SW Contact:    Johnathan Mitchell, LCSWA Phone Number: 02/13/2020, 8:47 AM   Clinical Narrative: [facility] CM/CSW consulted with pt.  Pt gave permission to speak with daughter/Johnathan Mitchell [phone].  Pt is in need of a HH nurse.    Johnathan Mitchell, MSW, LCSW-A Pronouns:  She, Her, Hers                  [facility] Transitions of CareClinical Social Worker [email or web address] [phone]   ED Mini Assessment: What brought you to the Emergency Department? : Foot and leg pain  Barriers to Discharge: No Barriers Identified     Means of departure: Not know  Interventions which prevented an admission or readmission: Home Health Consult or Services    Patient Contact and Communications Key Contact 1: Johnathan Mitchell   Spoke with: Johnathan Mitchell Date: 02/13/20,     Contact Phone Number: [phone] Call outcome: Pts daughter stated pt is in need of a [facility] nurse.  Patient states their goals for this hospitalization and ongoing recovery are:: Pt would like some assistance at home. CMS Medicare.gov Compare Post Acute Care list provided to:: Patient Choice offered to / list presented to : Patient,Adult Children  Admission diagnosis:  Lt side pain, gout Patient Active Problem List   Diagnosis Date Noted  . Cellulitis of left hand 12/15/2019  . Wrist pain 12/15/2019  . Closed left acetabular fracture (HCC) 12/14/2019  . SIRS (systemic inflammatory response syndrome) (HCC) 12/14/2019  . Protein-calorie malnutrition (HCC) 05/20/2017  . TIA (transient ischemic attack) 05/20/2017  . 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 Seizures (HCC) 05/19/2017  . AKI (acute kidney injury) (HCC) 05/12/2017  . Stroke (HCC) 05/11/2017  . Femur  fracture (HCC) 05/11/2017  . Chest pain 02/09/2016  . HTN (hypertension) 02/09/2016  . Dementia with behavioral disturbance (HCC) 02/09/2016  . Chest pain at rest 02/09/2016  . Cerebral infarction (HCC) 02/01/2014  . Benign essential HTN 02/01/2014   PCP:  [facility]   [facility] - [place], [place] - [address] [place] [address] Phone: [phone] Fax: [phone]

## 2020-02-13 NOTE — ED Provider Notes (Signed)
[facility]-EMERGENCY DEPT Provider Note  CSN: [id number] Arrival date & time: 02/13/20 0340  Chief Complaint(s) Foot Pain and Leg Pain ED Triage Notes Kerscher, Threasa Beards, RN (Registered Nurse) . [facility] . [facility] Date of Service: 02/13/2020 3:45 AM . . Signed   Arriving via EMS from home. Hx of Gout on the left side, especially affecting his wrist and left leg. Has prior stroke. Was DX with UTI here a week ago, currently has abdominal pain from which he has had no relief since his diagnosis.       HPI Johnathan Mitchell is a 86 y.o. male with a past medical history listed below including prior strokes and dementia.  Also with a history of gout here for left-sided pain.  He reports that the pain goes from his left foot all the way up to his left shoulder.  He states that the pain is there all the time but reports no current pain.  Difficult to obtain history from the patient given his dementia.  Remainder of history, ROS, and physical exam limited due to patient's condition (dementia). Additional information was obtained from daughter.   Level V Caveat.  Daughter reports that the patient is at his baseline mental status but does report that his memory has gradually worsened.  She confirms that the patient does complain of left-sided pain frequently; especially the left foot and wrist.  She denies any recent falls.  She did report that she is concerned he might be aspirating as he appears to choke while eating.  She denies any recent fevers or infections.  No emesis.  She does report that she is having difficulty taking care of him at home and is unsure whether she is adequately providing care and physical therapy for him.  Reports that home health was coming out but stopped coming 1 week ago.  She also confirms that patient is currently being treated for urinary tract infection with Keflex.  HPI  Past Medical History Past Medical History:  Diagnosis Date  .  Asthma   . Dementia (HCC)   . Hypertension   . Seizures (HCC)   . Stroke [facility])    Patient Active Problem List   Diagnosis Date Noted  . Cellulitis of left hand 12/15/2019  . Wrist pain 12/15/2019  . Closed left acetabular fracture (HCC) 12/14/2019  . SIRS (systemic inflammatory response syndrome) (HCC) 12/14/2019  . Protein-calorie malnutrition (HCC) 05/20/2017  . TIA (transient ischemic attack) 05/20/2017  . 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 Seizures (HCC) 05/19/2017  . AKI (acute kidney injury) (HCC) 05/12/2017  . Stroke (HCC) 05/11/2017  . Femur fracture (HCC) 05/11/2017  . Chest pain 02/09/2016  . HTN (hypertension) 02/09/2016  . Dementia with behavioral disturbance (HCC) 02/09/2016  . Chest pain at rest 02/09/2016  . Cerebral infarction (HCC) 02/01/2014  . Benign essential HTN 02/01/2014   Home Medication(s) Prior to Admission medications   Medication Sig Start Date End Date Taking? Authorizing Provider  acetaminophen (TYLENOL) 500 MG tablet Take 1 tablet (500 mg total) by mouth every 8 (eight) hours. 12/20/19   Almon Hercules, MD  amLODipine (NORVASC) 10 MG tablet Take 1 tablet (10 mg total) by mouth daily. 12/21/19   Almon Hercules, MD  aspirin EC 81 MG tablet Take 1 tablet (81 mg total) by mouth daily. Swallow whole. 12/20/19 12/19/20  Almon Hercules, MD  atorvastatin (LIPITOR) 80 MG tablet Take 1 tablet (80 mg total) by mouth daily  at 6 PM. 08/20/17   Medina-Vargas, Monina C, NP  bisacodyl (DULCOLAX) 5 MG EC tablet Take 1 tablet (5 mg total) by mouth daily as needed for moderate constipation. 12/20/19   Almon Hercules, MD  cephALEXin (KEFLEX) 500 MG capsule Take 1 capsule (500 mg total) by mouth 4 (four) times daily. 02/04/20   Elpidio Anis, PA-C  colchicine 0.6 MG tablet Take 1 tablet (0.6 mg total) by mouth daily. 02/04/20   Elpidio Anis, PA-C  HYDROPHILIC EX Apply 1 application topically 2 (two) times daily as needed (moiturizer).    [provider]   MENTHOL-METHYL SALICYLATE EX Apply 1 application topically 4 (four) times daily as needed (pain).    [provider]  Nutritional Supplements (,FEEDING SUPPLEMENT, PROSOURCE PLUS) liquid Take 30 mLs by mouth daily. 12/21/19   Almon Hercules, MD  senna-docusate (SENOKOT-S) 8.6-50 MG tablet Take 1 tablet by mouth 2 (two) times daily. 12/20/19   Almon Hercules, MD                                                                                                                                    Past Surgical History Past Surgical History:  Procedure Laterality Date  . BACK SURGERY    . FEMUR IM NAIL Left 05/13/2017   Procedure: INTRAMEDULLARY (IM) NAIL FEMORAL;  Surgeon: [name], MD;  Location: [facility] OR;  Service: Orthopedics;  Laterality: Left;  . KNEE SURGERY     Family History Family History  Problem Relation Age of Onset  . Hyperlipidemia Mother   . Hypertension Mother     Social History Social History   Tobacco Use  . Smoking status: Former Games developer  . Smokeless tobacco: Never Used  Vaping Use  . Vaping Use: Never used  Substance Use Topics  . Alcohol use: No  . Drug use: No   Allergies Penicillins  Review of Systems Review of Systems  Unable to perform ROS: Dementia    Physical Exam Vital Signs  I have reviewed the triage vital signs BP 130/86   Pulse 76   Temp (!) 97.5 F (36.4 C) (Oral)   Resp 12   Ht 5\' 6"  (1.676 m)   Wt 90.7 kg   SpO2 99%   BMI 32.28 kg/m   Physical Exam Vitals reviewed.  Constitutional:      General: He is not in acute distress.    Appearance: He is well-developed and well-nourished. He is not diaphoretic.  HENT:     Head: Normocephalic and atraumatic.     Nose: Nose normal.  Eyes:     General: No scleral icterus.       Right eye: No discharge.        Left eye: No discharge.     Extraocular Movements: EOM normal.     Conjunctiva/sclera: Conjunctivae normal.     Pupils: Pupils are equal, round, and reactive to light.  Cardiovascular:     Rate and Rhythm: Normal rate and regular rhythm.     Heart sounds: No murmur heard. No friction rub. No gallop.   Pulmonary:     Effort: Pulmonary effort is normal. No respiratory distress.     Breath sounds: Normal breath sounds. No stridor. No rales.  Abdominal:     General: There is no distension.     Palpations: Abdomen is soft.     Tenderness: There is no abdominal tenderness.  Musculoskeletal:        General: No tenderness or edema.       Arms:     Cervical back: Normal range of motion and neck supple.     Left upper leg: No tenderness.     Left lower leg: No tenderness.     Left foot: No swelling, deformity or tenderness.  Skin:    General: Skin is warm and dry.     Findings: No erythema or rash.  Neurological:     Mental Status: He is alert. He is disoriented.  Psychiatric:        Mood and Affect: Mood and affect normal.     ED Results and Treatments Labs (all labs ordered are listed, but only abnormal results are displayed) Labs Reviewed  CBC WITH DIFFERENTIAL/PLATELET - Abnormal; Notable for the following components:      Result Value   Hemoglobin 11.1 (*)    HCT 35.3 (*)    MCH 25.8 (*)    RDW 17.1 (*)    All other components within normal limits  BASIC METABOLIC PANEL - Abnormal; Notable for the following components:   Glucose, Bld 105 (*)    All other components within normal limits                                                                                                                         EKG  EKG Interpretation  Date/Time:    Ventricular Rate:    PR Interval:    QRS Duration:   QT Interval:    QTC Calculation:   R Axis:     Text Interpretation:        Radiology DG Chest Port 1 View  Result Date: 02/13/2020 CLINICAL DATA:  86 year old male with regurgitation, choking on food. EXAM: PORTABLE CHEST 1 VIEW COMPARISON:  Portable chest 02/03/2020 and earlier. FINDINGS: Portable AP semi upright view at 0517 hours.  Better positioning today. Cardiac size at the upper limits of normal. Mildly tortuous thoracic aorta. Other mediastinal contours are within normal limits. Visualized tracheal air column is within normal limits. Allowing for portable technique the lungs are clear. No pneumothorax. Negative visible bowel gas pattern. Stable visualized osseous structures. IMPRESSION: No acute cardiopulmonary abnormality identified. Electronically Signed   By: Odessa Fleming M.D.   On: 02/13/2020 05:43    Pertinent labs & imaging results that were available during my care of the patient were reviewed by me and considered in my medical decision  making (see chart for details).  Medications Ordered in ED Medications - No data to display                                                                                                                                  Procedures Procedures  (including critical care time)  Medical Decision Making / ED Course I have reviewed the nursing notes for this encounter and the patient's prior records (if available in EHR or on provided paperwork).   Johnathan InchesJames C Manahan Jr. was evaluated in Emergency Department on 02/13/2020 for the symptoms described in the history of present illness. He was evaluated in the context of the global COVID-19 pandemic, which necessitated consideration that the patient might be at risk for infection with the SARS-CoV-2 virus that causes COVID-19. Institutional protocols and algorithms that pertain to the evaluation of patients at risk for COVID-19 are in a state of rapid change based on information released by regulatory bodies including the CDC and federal and state organizations. These policies and algorithms were followed during the patient's care in the ED.   Clinical Course as of 02/13/20 [id number]  Wynelle LinkSun Feb 13, 2020  0400  Other the left wrist, patient has not TTP. No evidence of trauma. Given daughter's reports chocking, will get labs and CXR. [PC]  0550 CXR not  consistent with aspiration. [PC]  [id number] Labs reassuring. Daughter updated. TOC and face to face order placed.  [PC]    Clinical Course User Index [PC] Malosi Hemstreet, Amadeo GarnetPedro Eduardo, MD     Final Clinical Impression(s) / ED Diagnoses Final diagnoses:  Choking due to food (regurgitated)  Other chronic pain    The patient appears reasonably screened and/or stabilized for discharge and I doubt any other medical condition or other [facility] requiring further screening, evaluation, or treatment in the ED at this time prior to discharge. Safe for discharge with strict return precautions.  Disposition: Discharge  Condition: Good  I have discussed the results, Dx and Tx plan with the patient/family who expressed understanding and agree(s) with the plan. Discharge instructions discussed at length. The patient/family was given strict return precautions who verbalized understanding of the instructions. No further questions at time of discharge.    ED Discharge Orders    None       Follow Up: [facility], [facility] [address] [place] [address] [phone]  Call  As needed     This chart was dictated using voice recognition software.  Despite best efforts to proofread,  errors can occur which can change the documentation meaning.   Nira Connardama, Shya Kovatch Eduardo, MD 02/13/20 [phone]

## 2020-02-13 NOTE — ED Notes (Addendum)
Spoke to pt's daughter Marijean Niemann. Confirmed pt will be returning home to her. Requested call when transport arrives.

## 2020-03-07 ENCOUNTER — Encounter (HOSPITAL_COMMUNITY): Payer: Self-pay | Admitting: Emergency Medicine

## 2020-03-07 ENCOUNTER — Emergency Department (HOSPITAL_COMMUNITY)
Admission: EM | Admit: 2020-03-07 | Discharge: 2020-03-07 | Disposition: A | Discharge status: Home / Self Care | Payer: No Typology Code available for payment source | Attending: Emergency Medicine | Admitting: Emergency Medicine

## 2020-03-07 DIAGNOSIS — Z87891 Personal history of nicotine dependence: Secondary | ICD-10-CM | POA: Diagnosis not present

## 2020-03-07 DIAGNOSIS — Z7982 Long term (current) use of aspirin: Secondary | ICD-10-CM | POA: Diagnosis not present

## 2020-03-07 DIAGNOSIS — F0390 Unspecified dementia without behavioral disturbance: Secondary | ICD-10-CM | POA: Insufficient documentation

## 2020-03-07 DIAGNOSIS — M79605 Pain in left leg: Secondary | ICD-10-CM | POA: Insufficient documentation

## 2020-03-07 DIAGNOSIS — I10 Essential (primary) hypertension: Secondary | ICD-10-CM | POA: Insufficient documentation

## 2020-03-07 DIAGNOSIS — Z79899 Other long term (current) drug therapy: Secondary | ICD-10-CM | POA: Diagnosis not present

## 2020-03-07 DIAGNOSIS — M79602 Pain in left arm: Secondary | ICD-10-CM | POA: Diagnosis not present

## 2020-03-07 DIAGNOSIS — J45909 Unspecified asthma, uncomplicated: Secondary | ICD-10-CM | POA: Diagnosis not present

## 2020-03-07 HISTORY — DX: Gout, unspecified: M10.9

## 2020-03-07 MED ORDER — TRAMADOL HCL 50 MG PO TABS
50.0000 mg | ORAL_TABLET | Freq: Once | ORAL | Status: AC
  Administered 2020-03-07: 50 mg via ORAL
  Filled 2020-03-07: qty 1

## 2020-03-07 MED ORDER — TRAMADOL HCL 50 MG PO TABS: 50.0000 mg | ORAL_TABLET | Freq: Four times a day (QID) | ORAL | 0 refills | Status: DC | PRN

## 2020-03-07 NOTE — ED Triage Notes (Addendum)
Per EMS-complaining of left leg pain-history of gout-daughter found patient crying in bed-does not know how long symptoms have been going on-history of stroke, difficult for patient to communicate-daughter was on phone when EMS arrived-

## 2020-03-07 NOTE — ED Notes (Addendum)
Pt daughter updated on status. [facility] called for transport home.

## 2020-03-07 NOTE — ED Provider Notes (Signed)
[facility]-EMERGENCY DEPT Provider Note   CSN: [id number] Arrival date & time: 03/07/20  1304     History Chief Complaint  Patient presents with  . Leg Pain    Johnathan Mitchell. is a 86 y.o. male.  86 year old male with history of CVA in the past with resultant left-sided deficits presents due to increased pain to his left arm and left leg.  Patient states that this has been there for several months and the discomfort starts in his left foot and radiates all way up to his left arm.  He does have a contracture of the left hand.  Has been seen at the [facility] and prescribed Tylenol with codeine along with colchicine for possible gout.  Had a long discussion with patient started about his current situation.  She denies any new history of trauma.  He does have a prior history of a hip fracture.  Pain is worse with movement and is not interfering with his OT.  No recent visits to the PA        Past Medical History:  Diagnosis Date  . Asthma   . Dementia (HCC)   . Gout   . Hypertension   . Seizures (HCC)   . Stroke [facility])     Patient Active Problem List   Diagnosis Date Noted  . Cellulitis of left hand 12/15/2019  . Wrist pain 12/15/2019  . Closed left acetabular fracture (HCC) 12/14/2019  . SIRS (systemic inflammatory response syndrome) (HCC) 12/14/2019  . Protein-calorie malnutrition (HCC) 05/20/2017  . TIA (transient ischemic attack) 05/20/2017  . 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 Seizures (HCC) 05/19/2017  . AKI (acute kidney injury) (HCC) 05/12/2017  . Stroke (HCC) 05/11/2017  . Femur fracture (HCC) 05/11/2017  . Chest pain 02/09/2016  . HTN (hypertension) 02/09/2016  . Dementia with behavioral disturbance (HCC) 02/09/2016  . Chest pain at rest 02/09/2016  . Cerebral infarction (HCC) 02/01/2014  . Benign essential HTN 02/01/2014    Past Surgical History:  Procedure Laterality Date  . BACK SURGERY    . FEMUR IM NAIL Left 05/13/2017    Procedure: INTRAMEDULLARY (IM) NAIL FEMORAL;  Surgeon: [name], MD;  Location: [facility] OR;  Service: Orthopedics;  Laterality: Left;  . KNEE SURGERY         Family History  Problem Relation Age of Onset  . Hyperlipidemia Mother   . Hypertension Mother     Social History   Tobacco Use  . Smoking status: Former Games developer  . Smokeless tobacco: Never Used  Vaping Use  . Vaping Use: Never used  Substance Use Topics  . Alcohol use: No  . Drug use: No    Home Medications Prior to Admission medications   Medication Sig Start Date End Date Taking? Authorizing Provider  acetaminophen (TYLENOL) 500 MG tablet Take 1 tablet (500 mg total) by mouth every 8 (eight) hours. 12/20/19   Almon Hercules, MD  amLODipine (NORVASC) 10 MG tablet Take 1 tablet (10 mg total) by mouth daily. 12/21/19   Almon Hercules, MD  aspirin EC 81 MG tablet Take 1 tablet (81 mg total) by mouth daily. Swallow whole. 12/20/19 12/19/20  Almon Hercules, MD  atorvastatin (LIPITOR) 80 MG tablet Take 1 tablet (80 mg total) by mouth daily at 6 PM. 08/20/17   Medina-Vargas, Monina C, NP  bisacodyl (DULCOLAX) 5 MG EC tablet Take 1 tablet (5 mg total) by mouth daily as needed for moderate constipation. 12/20/19  Almon Hercules, MD  cephALEXin (KEFLEX) 500 MG capsule Take 1 capsule (500 mg total) by mouth 4 (four) times daily. 02/04/20   Elpidio Anis, PA-C  colchicine 0.6 MG tablet Take 1 tablet (0.6 mg total) by mouth daily. 02/04/20   Elpidio Anis, PA-C  HYDROPHILIC EX Apply 1 application topically 2 (two) times daily as needed (moiturizer).    [provider]  MENTHOL-METHYL SALICYLATE EX Apply 1 application topically 4 (four) times daily as needed (pain).    [provider]  Nutritional Supplements (,FEEDING SUPPLEMENT, PROSOURCE PLUS) liquid Take 30 mLs by mouth daily. 12/21/19   Almon Hercules, MD  senna-docusate (SENOKOT-S) 8.6-50 MG tablet Take 1 tablet by mouth 2 (two) times daily. 12/20/19   Almon Hercules, MD     Allergies    Penicillins  Review of Systems   Review of Systems  All other systems reviewed and are negative.   Physical Exam Updated Vital Signs BP (!) 127/91 (BP Location: Left Arm)   Pulse 85   Temp 97.7 F (36.5 C) (Oral)   Resp 18   SpO2 98%   Physical Exam Vitals and nursing note reviewed.  Constitutional:      General: He is not in acute distress.    Appearance: Normal appearance. He is well-developed and well-nourished. He is not toxic-appearing.  HENT:     Head: Normocephalic and atraumatic.  Eyes:     General: Lids are normal.     Extraocular Movements: EOM normal.     Conjunctiva/sclera: Conjunctivae normal.     Pupils: Pupils are equal, round, and reactive to light.  Neck:     Thyroid: No thyroid mass.     Trachea: No tracheal deviation.  Cardiovascular:     Rate and Rhythm: Normal rate and regular rhythm.     Heart sounds: Normal heart sounds. No murmur heard. No gallop.   Pulmonary:     Effort: Pulmonary effort is normal. No respiratory distress.     Breath sounds: Normal breath sounds. No stridor. No decreased breath sounds, wheezing, rhonchi or rales.  Abdominal:     General: Bowel sounds are normal. There is no distension.     Palpations: Abdomen is soft.     Tenderness: There is no abdominal tenderness. There is no CVA tenderness or rebound.  Musculoskeletal:        General: No tenderness or edema. Normal range of motion.     Cervical back: Normal range of motion and neck supple.     Comments: No evidence of swelling erythema or warmness to touch in patient's left ankle, left foot, left knee.  Slight tenderness to palpation throughout the entire leg and left arm without evidence of edema or infection  Skin:    General: Skin is warm and dry.     Findings: No abrasion or rash.  Neurological:     Mental Status: He is alert and oriented to person, place, and time.     GCS: GCS eye subscore is 4. GCS verbal subscore is 5. GCS motor subscore is 6.      Cranial Nerves: No cranial nerve deficit.     Sensory: No sensory deficit.     Motor: No tremor.     Deep Tendon Reflexes: Strength normal.     Comments: Strength is 5 of 5 in right upper and right lower extremity.  2 of 5 strength in the left upper extremity.  Same in left lower extremity.  Psychiatric:  Mood and Affect: Mood and affect normal.        Speech: Speech normal.        Behavior: Behavior normal.     ED Results / Procedures / Treatments   Labs (all labs ordered are listed, but only abnormal results are displayed) Labs Reviewed - No data to display  EKG None  Radiology No results found.  Procedures Procedures   Medications Ordered in ED Medications - No data to display  ED Course  I have reviewed the triage vital signs and the nursing notes.  Pertinent labs & imaging results that were available during my care of the patient were reviewed by me and considered in my medical decision making (see chart for details).    MDM Rules/Calculators/A&P                          Patient here with chronic pain to left upper and left lower extremity.  We will add Ultram to his regimen.  No evidence of septic joint.  No suspicion for DVT.  No suspicion for new trauma requiring imaging.  According the patient's daughter, he will follow-up in the [place] clinic.  She is comfortable with this plan Final Clinical Impression(s) / ED Diagnoses Final diagnoses:  None    Rx / DC Orders ED Discharge Orders    None       Lorre Nick, MD 03/07/20 1434

## 2020-03-07 NOTE — Discharge Instructions (Addendum)
Follow-up with the [facility] for further pain management

## 2020-03-12 ENCOUNTER — Emergency Department (HOSPITAL_COMMUNITY)
Admission: EM | Admit: 2020-03-12 | Discharge: 2020-03-12 | Disposition: A | Discharge status: Home / Self Care | Payer: No Typology Code available for payment source | Attending: Emergency Medicine | Admitting: Emergency Medicine

## 2020-03-12 ENCOUNTER — Other Ambulatory Visit: Payer: Self-pay

## 2020-03-12 ENCOUNTER — Encounter (HOSPITAL_COMMUNITY): Payer: Self-pay

## 2020-03-12 DIAGNOSIS — L89102 Pressure ulcer of unspecified part of back, stage 2: Secondary | ICD-10-CM | POA: Diagnosis present

## 2020-03-12 DIAGNOSIS — F0390 Unspecified dementia without behavioral disturbance: Secondary | ICD-10-CM | POA: Insufficient documentation

## 2020-03-12 DIAGNOSIS — Z87891 Personal history of nicotine dependence: Secondary | ICD-10-CM | POA: Diagnosis not present

## 2020-03-12 DIAGNOSIS — L89303 Pressure ulcer of unspecified buttock, stage 3: Secondary | ICD-10-CM | POA: Insufficient documentation

## 2020-03-12 DIAGNOSIS — Z79899 Other long term (current) drug therapy: Secondary | ICD-10-CM | POA: Insufficient documentation

## 2020-03-12 DIAGNOSIS — Z7982 Long term (current) use of aspirin: Secondary | ICD-10-CM | POA: Insufficient documentation

## 2020-03-12 DIAGNOSIS — J45909 Unspecified asthma, uncomplicated: Secondary | ICD-10-CM | POA: Insufficient documentation

## 2020-03-12 DIAGNOSIS — L89302 Pressure ulcer of unspecified buttock, stage 2: Secondary | ICD-10-CM | POA: Diagnosis not present

## 2020-03-12 DIAGNOSIS — I10 Essential (primary) hypertension: Secondary | ICD-10-CM | POA: Insufficient documentation

## 2020-03-12 MED ORDER — BACITRACIN ZINC 500 UNIT/GM EX OINT
1.0000 "application " | TOPICAL_OINTMENT | Freq: Two times a day (BID) | CUTANEOUS | Status: DC
  Administered 2020-03-12: 1 via TOPICAL
  Filled 2020-03-12: qty 0.9

## 2020-03-12 MED ORDER — BACITRACIN ZINC 500 UNIT/GM EX OINT: 1.0000 "application " | TOPICAL_OINTMENT | Freq: Two times a day (BID) | CUTANEOUS | 0 refills | Status: DC

## 2020-03-12 NOTE — ED Notes (Signed)
Wound care completed, pt tolerated well.

## 2020-03-12 NOTE — ED Triage Notes (Signed)
Pt BIB EMS  Pts daughter states that she noticed some new wounds to both buttocks. Hx of dementia and stroke with left side deficits. Not ambulatory, incontinent of urine and bowel.    Vitals 130/88 90 pulse 18 R 96%  CBG  161 Temp 98.2

## 2020-03-12 NOTE — ED Provider Notes (Signed)
[facility]-EMERGENCY DEPT Provider Note   CSN: [id number] Arrival date & time: 03/12/20  1455     History Chief Complaint  Patient presents with  . Skin Ulcer    Johnathan InchesJames C Kirschenmann Jr. is a 86 y.o. male.  HPI   This patient is a 86 year old male, he has a history of dementia, he has had a prior stroke which is left him with left-sided paralysis.  He has unfortunately developed some decubitus ulcers over his bilateral buttock in the sacral region from being bedbound and not mobile.  He has not been working with his therapy that comes into the home, he does have a home health aide but they have not been able to dress his wounds.  The daughter who is his primary caregiver has been doing the primary cleaning and dressing of the wounds which she recognized yesterday.  There has been no fevers, no other medical complaints and no other n/v/d.  [facility] and the daughter has been speaking with them and hospice about getting in home care.  He is eating and drinking  Past Medical History:  Diagnosis Date  . Asthma   . Dementia (HCC)   . Gout   . Hypertension   . Seizures (HCC)   . Stroke [facility](HCC)     Patient Active Problem List   Diagnosis Date Noted  . Cellulitis of left hand 12/15/2019  . Wrist pain 12/15/2019  . Closed left acetabular fracture (HCC) 12/14/2019  . SIRS (systemic inflammatory response syndrome) (HCC) 12/14/2019  . Protein-calorie malnutrition (HCC) 05/20/2017  . TIA (transient ischemic attack) 05/20/2017  . 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 Seizures (HCC) 05/19/2017  . AKI (acute kidney injury) (HCC) 05/12/2017  . Stroke (HCC) 05/11/2017  . Femur fracture (HCC) 05/11/2017  . Chest pain 02/09/2016  . HTN (hypertension) 02/09/2016  . Dementia with behavioral disturbance (HCC) 02/09/2016  . Chest pain at rest 02/09/2016  . Cerebral infarction (HCC) 02/01/2014  . Benign essential HTN 02/01/2014    Past Surgical History:   Procedure Laterality Date  . BACK SURGERY    . FEMUR IM NAIL Left 05/13/2017   Procedure: INTRAMEDULLARY (IM) NAIL FEMORAL;  Surgeon: Myrene GalasHandy, Michael, MD;  Location: [facility] OR;  Service: Orthopedics;  Laterality: Left;  . KNEE SURGERY         Family History  Problem Relation Age of Onset  . Hyperlipidemia Mother   . Hypertension Mother     Social History   Tobacco Use  . Smoking status: Former Games developermoker  . Smokeless tobacco: Never Used  Vaping Use  . Vaping Use: Never used  Substance Use Topics  . Alcohol use: No  . Drug use: No    Home Medications Prior to Admission medications   Medication Sig Start Date End Date Taking? Authorizing Provider  bacitracin ointment Apply 1 application topically 2 (two) times daily. 03/12/20  Yes Eber HongMiller, Branon Sabine, MD  acetaminophen (TYLENOL) 500 MG tablet Take 1 tablet (500 mg total) by mouth every 8 (eight) hours. 12/20/19   Almon HerculesGonfa, Taye T, MD  amLODipine (NORVASC) 10 MG tablet Take 1 tablet (10 mg total) by mouth daily. 12/21/19   Almon HerculesGonfa, Taye T, MD  aspirin EC 81 MG tablet Take 1 tablet (81 mg total) by mouth daily. Swallow whole. 12/20/19 12/19/20  Almon HerculesGonfa, Taye T, MD  atorvastatin (LIPITOR) 80 MG tablet Take 1 tablet (80 mg total) by mouth daily at 6 PM. 08/20/17   Medina-Vargas, Monina C,  NP  bisacodyl (DULCOLAX) 5 MG EC tablet Take 1 tablet (5 mg total) by mouth daily as needed for moderate constipation. 12/20/19   Almon Hercules, MD  cephALEXin (KEFLEX) 500 MG capsule Take 1 capsule (500 mg total) by mouth 4 (four) times daily. 02/04/20   Elpidio Anis, PA-C  colchicine 0.6 MG tablet Take 1 tablet (0.6 mg total) by mouth daily. 02/04/20   Elpidio Anis, PA-C  HYDROPHILIC EX Apply 1 application topically 2 (two) times daily as needed (moiturizer).    [provider]  MENTHOL-METHYL SALICYLATE EX Apply 1 application topically 4 (four) times daily as needed (pain).    [provider]  Nutritional Supplements (,FEEDING SUPPLEMENT, PROSOURCE  PLUS) liquid Take 30 mLs by mouth daily. 12/21/19   Almon Hercules, MD  senna-docusate (SENOKOT-S) 8.6-50 MG tablet Take 1 tablet by mouth 2 (two) times daily. 12/20/19   Almon Hercules, MD  traMADol (ULTRAM) 50 MG tablet Take 1 tablet (50 mg total) by mouth every 6 (six) hours as needed. 03/07/20   Lorre Nick, MD    Allergies    Penicillins  Review of Systems   Review of Systems  All other systems reviewed and are negative.   Physical Exam Updated Vital Signs BP (!) 122/92   Pulse 74   Temp 98.2 F (36.8 C) (Oral)   Resp 16   Ht 1.676 m (5\' 6" )   Wt 90.7 kg   SpO2 97%   BMI 32.27 kg/m   Physical Exam Vitals and nursing note reviewed.  Constitutional:      General: He is not in acute distress.    Appearance: He is well-developed and well-nourished.  HENT:     Head: Normocephalic and atraumatic.     Mouth/Throat:     Mouth: Oropharynx is clear and moist. Mucous membranes are moist.     Pharynx: No oropharyngeal exudate.  Eyes:     General: No scleral icterus.       Right eye: No discharge.        Left eye: No discharge.     Extraocular Movements: EOM normal.     Conjunctiva/sclera: Conjunctivae normal.     Pupils: Pupils are equal, round, and reactive to light.  Neck:     Thyroid: No thyromegaly.     Vascular: No JVD.  Cardiovascular:     Rate and Rhythm: Normal rate and regular rhythm.     Pulses: Intact distal pulses.     Heart sounds: Normal heart sounds. No murmur heard. No friction rub. No gallop.   Pulmonary:     Effort: Pulmonary effort is normal. No respiratory distress.     Breath sounds: Normal breath sounds. No wheezing or rales.  Abdominal:     General: Bowel sounds are normal. There is no distension.     Palpations: Abdomen is soft. There is no mass.     Tenderness: There is no abdominal tenderness.     Comments: Nontender abdomen  Musculoskeletal:        General: Tenderness present. No edema.     Cervical back: Normal range of motion and neck  supple.     Comments: There is tenderness with manipulation of the left arm and left leg but there is no signs of redness or significant swelling, there is contractures of these 2 parts of the body.  The right arm and right leg are supple, he is able to move them, he has a slight weakness but overall generally deconditioned  Lymphadenopathy:     Cervical: No cervical adenopathy.  Skin:    General: Skin is warm and dry.     Findings: No erythema or rash.     Comments: Decubitus ulcers of the bilateral buttock are present up higher towards the bottom of the sacrum.  One is stage 2 and one is stage 3, no muscle / bone exposed, no drainage, no celluitis, no surrounding warmth, no foul smell.  Neurological:     Mental Status: He is alert.     Coordination: Coordination normal.     Comments: Awake alert and able to tell me his name, he is not aware of the date, he knows he is in the hospital.  This seems to be baseline.  He moves his right side, he does not move his left side very much though he can move his foot a little bit.  He cannot lift his leg off the bed.  Psychiatric:        Mood and Affect: Mood and affect normal.        Behavior: Behavior normal.     ED Results / Procedures / Treatments   Labs (all labs ordered are listed, but only abnormal results are displayed) Labs Reviewed - No data to display  EKG None  Radiology No results found.  Procedures Procedures   Medications Ordered in ED Medications  bacitracin ointment 1 application (1 application Topical Given 03/12/20 1539)    ED Course  I have reviewed the triage vital signs and the nursing notes.  Pertinent labs & imaging results that were available during my care of the patient were reviewed by me and considered in my medical decision making (see chart for details).    MDM Rules/Calculators/A&P                          Pt is well appearing Will consult with TOC team regarding possibly increased home health  resources for wound care, no need for labs or antibiotics.  Daughter is in agreement with this plan.  TOC - Velna Hatchet has seen pt and tried to contact [facility] who is not answering - she has requested that I do a face-to-face discharge order for home health, skilled nursing to address wound care, this has been done.  The patient is safe and stable to be discharged, he has had his wounds dressed, instructions for family members included in discharge instructions regarding caring for pressure ulcers.  Social work will follow up in the morning per Velna Hatchet  Final Clinical Impression(s) / ED Diagnoses Final diagnoses:  Pressure injury of back, stage 2 (HCC)    Rx / DC Orders ED Discharge Orders         Ordered    bacitracin ointment  2 times daily        03/12/20 1704    Home Health        03/12/20 1706    Face-to-face encounter (required for Medicare/Medicaid patients)       Comments: I Vida Roller certify that this patient is under my care and that I, or a nurse practitioner or physician's assistant working with me, had a face-to-face encounter that meets the physician face-to-face encounter requirements with this patient on 03/12/2020. The encounter with the patient was in whole, or in part for the following medical condition(s) which is the primary reason for home health care (List medical condition): sacral decub X 2 on the buttocks,  Needs home health  wound care consulation and wound care   03/12/20 1706           Eber Hong, MD 03/12/20 [phone]

## 2020-03-12 NOTE — Discharge Instructions (Signed)
Unfortunately you have developed a couple of pressure sores on your bottom, you will benefit from having these dressings changed daily, apply a thick application of bacitracin ointment covered by a sterile dressing.  I have asked for the home health service to send out a wound care specialist to look at your wounds and to help address them and treat them appropriately.  Please read the attached instructions about preventing pressure injuries, he will need to be rotated from side to side with a pillow underneath the side that is up off of the bed every couple of hours  ER for worsening symtpoms.

## 2020-03-12 NOTE — Progress Notes (Signed)
CSW met with Pt at bedside.  CSW also spoke via phone with daughter who states Pt is receiving OT from [facility].  CSW was able to reach [place] on-call nurse who will connect with [place] tomorrow 2/14 to have wound care added to services.  CSW left handoff for RN Case manager for follow up. Updated EDP   03/12/20 1701  TOC ED Mini Assessment  TOC Time spent with patient (minutes): 45  PING Used in TOC Assessment No  Admission or Readmission Diverted Yes  Interventions which prevented an admission or readmission [place] or Services  What brought you to the Emergency Department?  Wound care  Barriers to Discharge No Barriers Identified  Barrier interventions CSW spoke with Benjamine Mola at [facility] to begin [facility] process  Means of departure Not know  Key Contact [place], [place]  Contact Date 03/12/20  Contact time 24  Contact Phone Number [phone]  Call outcome Daughter staes Pt needs wound care [facility]

## 2020-04-19 ENCOUNTER — Emergency Department (HOSPITAL_COMMUNITY)
Admission: EM | Admit: 2020-04-19 | Discharge: 2020-04-20 | Disposition: A | Discharge status: Home / Self Care | Payer: No Typology Code available for payment source | Attending: Emergency Medicine | Admitting: Emergency Medicine

## 2020-04-19 ENCOUNTER — Other Ambulatory Visit: Payer: Self-pay

## 2020-04-19 ENCOUNTER — Emergency Department (HOSPITAL_COMMUNITY): Payer: No Typology Code available for payment source

## 2020-04-19 ENCOUNTER — Encounter (HOSPITAL_COMMUNITY): Payer: Self-pay

## 2020-04-19 DIAGNOSIS — J45909 Unspecified asthma, uncomplicated: Secondary | ICD-10-CM | POA: Insufficient documentation

## 2020-04-19 DIAGNOSIS — Z7982 Long term (current) use of aspirin: Secondary | ICD-10-CM | POA: Insufficient documentation

## 2020-04-19 DIAGNOSIS — I10 Essential (primary) hypertension: Secondary | ICD-10-CM | POA: Diagnosis not present

## 2020-04-19 DIAGNOSIS — Z87891 Personal history of nicotine dependence: Secondary | ICD-10-CM | POA: Insufficient documentation

## 2020-04-19 DIAGNOSIS — F0390 Unspecified dementia without behavioral disturbance: Secondary | ICD-10-CM | POA: Diagnosis not present

## 2020-04-19 DIAGNOSIS — Z79899 Other long term (current) drug therapy: Secondary | ICD-10-CM | POA: Insufficient documentation

## 2020-04-19 DIAGNOSIS — R4182 Altered mental status, unspecified: Secondary | ICD-10-CM | POA: Diagnosis present

## 2020-04-19 LAB — COMPREHENSIVE METABOLIC PANEL
ALT: 12 U/L (ref 0–44)
AST: 18 U/L (ref 15–41)
Albumin: 2.6 g/dL — ABNORMAL LOW (ref 3.5–5.0)
Alkaline Phosphatase: 74 U/L (ref 38–126)
Anion gap: 7 (ref 5–15)
BUN: 13 mg/dL (ref 8–23)
CO2: 30 mmol/L (ref 22–32)
Calcium: 9.2 mg/dL (ref 8.9–10.3)
Chloride: 105 mmol/L (ref 98–111)
Creatinine, Ser: 0.91 mg/dL (ref 0.61–1.24)
GFR, Estimated: >60 mL/min (ref >60)
Glucose, Bld: 168 mg/dL — ABNORMAL HIGH (ref 70–99)
Potassium: 3.8 mmol/L (ref 3.5–5.1)
Sodium: 142 mmol/L (ref 135–145)
Total Bilirubin: 0.4 mg/dL (ref 0.3–1.2)
Total Protein: 7 g/dL (ref 6.5–8.1)

## 2020-04-19 LAB — CBC
HCT: 37.3 % — ABNORMAL LOW (ref 39.0–52.0)
Hemoglobin: 11.6 g/dL — ABNORMAL LOW (ref 13.0–17.0)
MCH: 25.8 pg — ABNORMAL LOW (ref 26.0–34.0)
MCHC: 31.1 g/dL (ref 30.0–36.0)
MCV: 82.9 fL (ref 80.0–100.0)
Platelets: 245 10*3/uL (ref 150–400)
RBC: 4.5 MIL/uL (ref 4.22–5.81)
RDW: 16.6 % — ABNORMAL HIGH (ref 11.5–15.5)
WBC: 7.7 10*3/uL (ref 4.0–10.5)
nRBC: 0 % (ref 0.0–0.2)

## 2020-04-19 LAB — TROPONIN I (HIGH SENSITIVITY): Troponin I (High Sensitivity): 4 ng/L (ref <18)

## 2020-04-19 LAB — PROTIME-INR
INR: 1 (ref 0.8–1.2)
Prothrombin Time: 12.6 seconds (ref 11.4–15.2)

## 2020-04-19 LAB — APTT: aPTT: 32 seconds (ref 24–36)

## 2020-04-19 LAB — LACTIC ACID, PLASMA: Lactic Acid, Venous: 3.4 mmol/L (ref 0.5–1.9)

## 2020-04-19 NOTE — ED Notes (Signed)
Contact person is Donato Heinz [phone] for an update please

## 2020-04-19 NOTE — ED Provider Notes (Incomplete)
[facility] EMERGENCY DEPARTMENT Provider Note   CSN: [id number] Arrival date & time: 04/19/20  2119     History Chief Complaint  Patient presents with  . Altered Mental Status    Johnathan Mitchell. is a 86 y.o. male who presents via EMS for reported altered mental status at home.  Per EMS family states patient has been lethargic, patient has been alert and interactive with EMS and with ED staff.  Patient underlying dementia unable to contribute to his history.  He is morning to to person and place but not to time and is noncontributory to his history.  He does not appear in any acute distress.  LEVEL 5 CAVEAT due to patient's underlying dementia.  I have personally with the patient's medical records.  Has history of hypertension, CVA, dementia, not on any anticoagulation.  HPI     Past Medical History:  Diagnosis Date  . Asthma   . Dementia (HCC)   . Gout   . Hypertension   . Seizures (HCC)   . Stroke [facility])     Patient Active Problem List   Diagnosis Date Noted  . Cellulitis of left hand 12/15/2019  . Wrist pain 12/15/2019  . Closed left acetabular fracture (HCC) 12/14/2019  . SIRS (systemic inflammatory response syndrome) (HCC) 12/14/2019  . Protein-calorie malnutrition (HCC) 05/20/2017  . TIA (transient ischemic attack) 05/20/2017  . 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 Seizures (HCC) 05/19/2017  . AKI (acute kidney injury) (HCC) 05/12/2017  . Stroke (HCC) 05/11/2017  . Femur fracture (HCC) 05/11/2017  . Chest pain 02/09/2016  . HTN (hypertension) 02/09/2016  . Dementia with behavioral disturbance (HCC) 02/09/2016  . Chest pain at rest 02/09/2016  . Cerebral infarction (HCC) 02/01/2014  . Benign essential HTN 02/01/2014    Past Surgical History:  Procedure Laterality Date  . BACK SURGERY    . FEMUR IM NAIL Left 05/13/2017   Procedure: INTRAMEDULLARY (IM) NAIL FEMORAL;  Surgeon: [name], MD;  Location: [facility] OR;  Service:  Orthopedics;  Laterality: Left;  . KNEE SURGERY         Family History  Problem Relation Age of Onset  . Hyperlipidemia Mother   . Hypertension Mother     Social History   Tobacco Use  . Smoking status: Former Games developer  . Smokeless tobacco: Never Used  Vaping Use  . Vaping Use: Never used  Substance Use Topics  . Alcohol use: No  . Drug use: No    Home Medications Prior to Admission medications   Medication Sig Start Date End Date Taking? Authorizing Provider  acetaminophen (TYLENOL) 500 MG tablet Take 1 tablet (500 mg total) by mouth every 8 (eight) hours. 12/20/19   Almon Hercules, MD  amLODipine (NORVASC) 10 MG tablet Take 1 tablet (10 mg total) by mouth daily. 12/21/19   Almon Hercules, MD  aspirin EC 81 MG tablet Take 1 tablet (81 mg total) by mouth daily. Swallow whole. 12/20/19 12/19/20  Almon Hercules, MD  atorvastatin (LIPITOR) 80 MG tablet Take 1 tablet (80 mg total) by mouth daily at 6 PM. 08/20/17   Medina-Vargas, Monina C, NP  bacitracin ointment Apply 1 application topically 2 (two) times daily. 03/12/20   Eber Hong, MD  bisacodyl (DULCOLAX) 5 MG EC tablet Take 1 tablet (5 mg total) by mouth daily as needed for moderate constipation. 12/20/19   Almon Hercules, MD  cephALEXin (KEFLEX) 500 MG capsule Take 1 capsule (500 mg total)  by mouth 4 (four) times daily. 02/04/20   Elpidio Anis, PA-C  colchicine 0.6 MG tablet Take 1 tablet (0.6 mg total) by mouth daily. 02/04/20   Elpidio Anis, PA-C  HYDROPHILIC EX Apply 1 application topically 2 (two) times daily as needed (moiturizer).    [provider]  MENTHOL-METHYL SALICYLATE EX Apply 1 application topically 4 (four) times daily as needed (pain).    [provider]  Nutritional Supplements (,FEEDING SUPPLEMENT, PROSOURCE PLUS) liquid Take 30 mLs by mouth daily. 12/21/19   Almon Hercules, MD  senna-docusate (SENOKOT-S) 8.6-50 MG tablet Take 1 tablet by mouth 2 (two) times daily. 12/20/19   Almon Hercules, MD   traMADol (ULTRAM) 50 MG tablet Take 1 tablet (50 mg total) by mouth every 6 (six) hours as needed. 03/07/20   Lorre Nick, MD    Allergies    Penicillins  Review of Systems   Review of Systems  Physical Exam Updated Vital Signs BP 116/89   Pulse (!) 101   Temp 99.5 F (37.5 C) (Oral)   Resp 13   Wt 63.1 kg   SpO2 99%   BMI 22.45 kg/m   Physical Exam  ED Results / Procedures / Treatments   Labs (all labs ordered are listed, but only abnormal results are displayed) Labs Reviewed  COMPREHENSIVE METABOLIC PANEL - Abnormal; Notable for the following components:      Result Value   Glucose, Bld 168 (*)    Albumin 2.6 (*)    All other components within normal limits  CBC - Abnormal; Notable for the following components:   Hemoglobin 11.6 (*)    HCT 37.3 (*)    MCH 25.8 (*)    RDW 16.6 (*)    All other components within normal limits  URINE CULTURE  PROTIME-INR  APTT  LACTIC ACID, PLASMA  LACTIC ACID, PLASMA  URINALYSIS, ROUTINE W REFLEX MICROSCOPIC  TROPONIN I (HIGH SENSITIVITY)    EKG None  Radiology No results found.  Procedures Procedures {Remember to document critical care time when appropriate:1}  Medications Ordered in ED Medications - No data to display  ED Course  I have reviewed the triage vital signs and the nursing notes.  Pertinent labs & imaging results that were available during my care of the patient were reviewed by me and considered in my medical decision making (see chart for details).    MDM Rules/Calculators/A&P                          *** Final Clinical Impression(s) / ED Diagnoses Final diagnoses:  None    Rx / DC Orders ED Discharge Orders    None

## 2020-04-19 NOTE — ED Triage Notes (Signed)
BIB EMS after family reports AMS, unsure what baseline is. Family reports lethargic but patient is alert on arrival  Patient has gout in left arm and screams and spits.

## 2020-04-19 NOTE — ED Provider Notes (Signed)
[facility] Provider Note   CSN: [id number] Arrival date & time: 04/19/20  2119     History Chief Complaint  Patient presents with  . Altered Mental Status    Johnathan InchesJames C Gravatt Jr. is a 86 y.o. male who presents via EMS for reported altered mental status at home.  Per EMS family states patient has been lethargic, patient has been alert and interactive with EMS and with ED staff.  Patient underlying dementia unable to contribute to his history.  He is morning to to person and place but not to time and is noncontributory to his history.  He does not appear in any acute distress.  LEVEL 5 CAVEAT due to patient's underlying dementia.  I have personally with the patient's medical records.  Has history of hypertension, CVA, dementia, not on any anticoagulation.  HPI     Past Medical History:  Diagnosis Date  . Asthma   . Dementia (HCC)   . Gout   . Hypertension   . Seizures (HCC)   . Stroke [facility](HCC)     Patient Active Problem List   Diagnosis Date Noted  . Cellulitis of left hand 12/15/2019  . Wrist pain 12/15/2019  . Closed left acetabular fracture (HCC) 12/14/2019  . SIRS (systemic inflammatory response syndrome) (HCC) 12/14/2019  . Protein-calorie malnutrition (HCC) 05/20/2017  . TIA (transient ischemic attack) 05/20/2017  . 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 Seizures (HCC) 05/19/2017  . AKI (acute kidney injury) (HCC) 05/12/2017  . Stroke (HCC) 05/11/2017  . Femur fracture (HCC) 05/11/2017  . Chest pain 02/09/2016  . HTN (hypertension) 02/09/2016  . Dementia with behavioral disturbance (HCC) 02/09/2016  . Chest pain at rest 02/09/2016  . Cerebral infarction (HCC) 02/01/2014  . Benign essential HTN 02/01/2014    Past Surgical History:  Procedure Laterality Date  . BACK SURGERY    . FEMUR IM NAIL Left 05/13/2017   Procedure: INTRAMEDULLARY (IM) NAIL FEMORAL;  Surgeon: Myrene GalasHandy, Michael, MD;  Location: [facility] OR;  Service:  Orthopedics;  Laterality: Left;  . KNEE SURGERY         Family History  Problem Relation Age of Onset  . Hyperlipidemia Mother   . Hypertension Mother     Social History   Tobacco Use  . Smoking status: Former Games developermoker  . Smokeless tobacco: Never Used  Vaping Use  . Vaping Use: Never used  Substance Use Topics  . Alcohol use: No  . Drug use: No    Home Medications Prior to Admission medications   Medication Sig Start Date End Date Taking? Authorizing Provider  acetaminophen (TYLENOL) 500 MG tablet Take 1 tablet (500 mg total) by mouth every 8 (eight) hours. 12/20/19  Yes Almon HerculesGonfa, Taye T, MD  allopurinol (ZYLOPRIM) 100 MG tablet Take 100 mg by mouth daily. 02/17/20  Yes [provider]  amLODipine (NORVASC) 10 MG tablet Take 1 tablet (10 mg total) by mouth daily. 12/21/19  Yes Almon HerculesGonfa, Taye T, MD  aspirin EC 81 MG tablet Take 1 tablet (81 mg total) by mouth daily. Swallow whole. 12/20/19 12/19/20 Yes Almon HerculesGonfa, Taye T, MD  atorvastatin (LIPITOR) 80 MG tablet Take 1 tablet (80 mg total) by mouth daily at 6 PM. 08/20/17  Yes Medina-Vargas, Monina C, NP  bacitracin ointment Apply 1 application topically 2 (two) times daily. 03/12/20  Yes Eber HongMiller, Brian, MD  bisacodyl (DULCOLAX) 5 MG EC tablet Take 1 tablet (5 mg total) by mouth daily as needed for moderate constipation. 12/20/19  Yes Almon Hercules, MD  colchicine 0.6 MG tablet Take 1 tablet (0.6 mg total) by mouth daily. 02/04/20  Yes Upstill, Shari, PA-C  HYDROPHILIC EX Apply 1 application topically 2 (two) times daily as needed (moiturizer).   Yes [provider]  MENTHOL-METHYL SALICYLATE EX Apply 1 application topically 4 (four) times daily as needed (pain).   Yes [provider]  Nutritional Supplements (,FEEDING SUPPLEMENT, PROSOURCE PLUS) liquid Take 30 mLs by mouth daily. 12/21/19  Yes Gonfa, Boyce Medici, MD  senna-docusate (SENOKOT-S) 8.6-50 MG tablet Take 1 tablet by mouth 2 (two) times daily. 12/20/19  Yes Almon Hercules, MD  traMADol (ULTRAM) 50 MG tablet Take 1 tablet (50 mg total) by mouth every 6 (six) hours as needed. 03/07/20  Yes Lorre Nick, MD  cephALEXin (KEFLEX) 500 MG capsule Take 1 capsule (500 mg total) by mouth 4 (four) times daily. 02/04/20   Elpidio Anis, PA-C    Allergies    Penicillins and Lexapro [escitalopram]  Review of Systems   Review of Systems  Unable to perform ROS: Dementia    Physical Exam Updated Vital Signs BP (!) 132/93   Pulse 73   Temp 98.8 F (37.1 C) (Oral)   Resp 12   Wt 63.1 kg   SpO2 98%   BMI 22.45 kg/m   Physical Exam Vitals and nursing note reviewed.  Constitutional:      Appearance: He is not ill-appearing or toxic-appearing.  HENT:     Head: Normocephalic and atraumatic.     Mouth/Throat:     Mouth: Mucous membranes are moist.     Pharynx: No oropharyngeal exudate or posterior oropharyngeal erythema.  Eyes:     General:        Right eye: No discharge.        Left eye: No discharge.     Extraocular Movements: Extraocular movements intact.     Conjunctiva/sclera: Conjunctivae normal.     Pupils: Pupils are equal, round, and reactive to light.  Cardiovascular:     Rate and Rhythm: Normal rate and regular rhythm.     Pulses: Normal pulses.     Heart sounds: Normal heart sounds. No murmur heard.   Pulmonary:     Effort: Pulmonary effort is normal. No respiratory distress.     Breath sounds: Normal breath sounds. No wheezing or rales.  Abdominal:     General: Bowel sounds are normal. There is no distension.     Palpations: Abdomen is soft.     Tenderness: There is no abdominal tenderness. There is no guarding or rebound.  Musculoskeletal:        General: No deformity.     Cervical back: Neck supple. No tenderness.     Right lower leg: No edema.     Left lower leg: No edema.     Comments: Contracture of the left upper extremity, and decreased motor function in the left upper and lower extremities following prior CVA.  At patient  baseline.  Lymphadenopathy:     Cervical: No cervical adenopathy.  Skin:    General: Skin is warm and dry.     Capillary Refill: Capillary refill takes less than 2 seconds.       Neurological:     Mental Status: He is alert. Mental status is at baseline.  Psychiatric:        Mood and Affect: Mood normal.     ED Results / Procedures / Treatments   Labs (all labs ordered are  listed, but only abnormal results are displayed) Labs Reviewed  COMPREHENSIVE METABOLIC PANEL - Abnormal; Notable for the following components:      Result Value   Glucose, Bld 168 (*)    Albumin 2.6 (*)    All other components within normal limits  CBC - Abnormal; Notable for the following components:   Hemoglobin 11.6 (*)    HCT 37.3 (*)    MCH 25.8 (*)    RDW 16.6 (*)    All other components within normal limits  LACTIC ACID, PLASMA - Abnormal; Notable for the following components:   Lactic Acid, Venous 3.4 (*)    All other components within normal limits  URINALYSIS, ROUTINE W REFLEX MICROSCOPIC - Abnormal; Notable for the following components:   Hgb urine dipstick SMALL (*)    Protein, ur 30 (*)    All other components within normal limits  URINE CULTURE  PROTIME-INR  APTT  LACTIC ACID, PLASMA  TROPONIN I (HIGH SENSITIVITY)  TROPONIN I (HIGH SENSITIVITY)    EKG EKG Interpretation  Date/Time:  Wednesday April 19 2020 21:28:19 EDT Ventricular Rate:  109 PR Interval:    QRS Duration: 79 QT Interval:  369 QTC Calculation: 497 R Axis:   46 Text Interpretation: Sinus tachycardia Low voltage, precordial leads Borderline T abnormalities, diffuse leads Borderline prolonged QT interval When compared with ECG of 02/03/2020, QT has lengthened Confirmed by Dione Booze ([id number]) on 04/20/2020 1:14:24 AM   Radiology DG Chest Port 1 View  Result Date: 04/19/2020 CLINICAL DATA:  Questionable sepsis EXAM: PORTABLE CHEST 1 VIEW COMPARISON:  02/13/2020 FINDINGS: Heart is borderline in size. Right lung  clear. Left base atelectasis or scarring. No effusions or pneumothorax. No acute bony abnormality. IMPRESSION: Left base atelectasis or scarring. No active disease. Electronically Signed   By: Charlett Nose M.D.   On: 04/19/2020 23:10    Procedures Procedures  Medications Ordered in ED Medications - No data to display  ED Course  I have reviewed the triage vital signs and the nursing notes.  Pertinent labs & imaging results that were available during my care of the patient were reviewed by me and considered in my medical decision making (see chart for details).  Clinical Course as of 04/20/20 1725  Wed Apr 19, 2020  2330 Initial lactic acid significantly elevated to 3.4, however RN informed this provider that it was drawn was tourniquet applied, and sent in the lab of where it sat for nearly 1 hour prior to being run.  Concern for invalid result.  Will redraw lactic acid. [RS]  2334 Multiple times made to contact patient's family members without success. [RS]  2345 Repeat lactic acid normal, 1.9.  Suspect initial value with false elevation [RS]    Clinical Course User Index [RS] Sponseller, Idelia Salm   MDM Rules/Calculators/A&P                         86 year old male who presents via EMS for reported altered mental status at home.  Patient is alert and interactive to my initial exam.  Tachycardic to 109 on intake, vital signs otherwise normal.  Patient is borderline febrile 99.5 F.  Cardiopulmonary exam unremarkable, abdominal exam is benign.  Contracture of the left arm, without redness or significant swelling.  Left-sided paralysis secondary to prior CVA. Stage 1 decubitus ulcers on buttocks bilaterally.   CBC with mild anemia, patient baseline, CMP unremarkable, UA with small hemoglobin, otherwise unremarkable.  Normal coag  studies, troponin is negative, 7, and lactic acid is normal.  Chest x-ray negative for active pulmonary disease, there does appear to be some left basilar  scarring versus atelectasis.  As breath sounds are clear and patient is without cough, favor inflammatory rather than infectious etiology.  EKG with sinus tachycardia, mild prolongation of his QT, but without lethal arrhythmia or STEMI.  Unable to contact patient's next of kin for further insight into why patient was brought to the emergency department today.  Medical screening exam and evaluation is reassuring.  There is not appear to be any acute abnormality in this patient's work-up today.  Feel he is safe to be discharged home.  He may board in the emergency department until staff is able to make contact with his caretakers to confirm their presence to receive him home.  Patient sleeping calmly at this time.  This chart was dictated using voice recognition software, Dragon. Despite the best efforts of this provider to proofread and correct errors, errors may still occur which can change documentation meaning.  Final Clinical Impression(s) / ED Diagnoses Final diagnoses:  Dementia without behavioral disturbance, unspecified dementia type [facility])    Rx / DC Orders ED Discharge Orders    None       Paris Lore, PA-C 04/20/20 1726    Horton, Clabe Seal, DO 04/21/20 0012

## 2020-04-20 LAB — URINALYSIS, ROUTINE W REFLEX MICROSCOPIC
Bacteria, UA: NONE SEEN
Bilirubin Urine: NEGATIVE
Glucose, UA: NEGATIVE mg/dL
Ketones, ur: NEGATIVE mg/dL
Leukocytes,Ua: NEGATIVE
Nitrite: NEGATIVE
Protein, ur: 30 mg/dL — AB
Specific Gravity, Urine: 1.025 (ref 1.005–1.030)
pH: 5 (ref 5.0–8.0)

## 2020-04-20 LAB — TROPONIN I (HIGH SENSITIVITY): Troponin I (High Sensitivity): 7 ng/L (ref <18)

## 2020-04-20 LAB — LACTIC ACID, PLASMA: Lactic Acid, Venous: 1.9 mmol/L (ref 0.5–1.9)

## 2020-04-20 NOTE — ED Notes (Signed)
[facility] called  

## 2020-04-20 NOTE — Discharge Instructions (Addendum)
Johnathan Mitchell was seen in the ER today for evaluation.  His blood work, physical exam, chest x-ray, and EKG were very reassuring.  There are no emergent medical problems with this patient.  He can follow-up with his primary care doctor.

## 2020-04-21 LAB — URINE CULTURE: Culture: NO GROWTH

## 2020-06-30 ENCOUNTER — Emergency Department (HOSPITAL_COMMUNITY)
Admission: EM | Admit: 2020-06-30 | Discharge: 2020-06-30 | Disposition: A | Discharge status: Home / Self Care | Payer: No Typology Code available for payment source | Attending: Emergency Medicine | Admitting: Emergency Medicine

## 2020-06-30 ENCOUNTER — Emergency Department (HOSPITAL_COMMUNITY): Payer: No Typology Code available for payment source

## 2020-06-30 DIAGNOSIS — Z87891 Personal history of nicotine dependence: Secondary | ICD-10-CM | POA: Diagnosis not present

## 2020-06-30 DIAGNOSIS — J45909 Unspecified asthma, uncomplicated: Secondary | ICD-10-CM | POA: Insufficient documentation

## 2020-06-30 DIAGNOSIS — R569 Unspecified convulsions: Secondary | ICD-10-CM | POA: Insufficient documentation

## 2020-06-30 DIAGNOSIS — Z79899 Other long term (current) drug therapy: Secondary | ICD-10-CM | POA: Insufficient documentation

## 2020-06-30 DIAGNOSIS — F0391 Unspecified dementia with behavioral disturbance: Secondary | ICD-10-CM | POA: Insufficient documentation

## 2020-06-30 DIAGNOSIS — I10 Essential (primary) hypertension: Secondary | ICD-10-CM | POA: Diagnosis not present

## 2020-06-30 DIAGNOSIS — Z7982 Long term (current) use of aspirin: Secondary | ICD-10-CM | POA: Diagnosis not present

## 2020-06-30 LAB — CBC WITH DIFFERENTIAL/PLATELET
Abs Immature Granulocytes: 0.07 10*3/uL (ref 0.00–0.07)
Basophils Absolute: 0 10*3/uL (ref 0.0–0.1)
Basophils Relative: 0 %
Eosinophils Absolute: 0 10*3/uL (ref 0.0–0.5)
Eosinophils Relative: 0 %
HCT: 36.4 % — ABNORMAL LOW (ref 39.0–52.0)
Hemoglobin: 11.3 g/dL — ABNORMAL LOW (ref 13.0–17.0)
Immature Granulocytes: 1 %
Lymphocytes Relative: 13 %
Lymphs Abs: 1.3 10*3/uL (ref 0.7–4.0)
MCH: 24.8 pg — ABNORMAL LOW (ref 26.0–34.0)
MCHC: 31 g/dL (ref 30.0–36.0)
MCV: 80 fL (ref 80.0–100.0)
Monocytes Absolute: 0.8 10*3/uL (ref 0.1–1.0)
Monocytes Relative: 8 %
Neutro Abs: 7.7 10*3/uL (ref 1.7–7.7)
Neutrophils Relative %: 78 %
Platelets: 360 10*3/uL (ref 150–400)
RBC: 4.55 MIL/uL (ref 4.22–5.81)
RDW: 17.2 % — ABNORMAL HIGH (ref 11.5–15.5)
WBC: 9.9 10*3/uL (ref 4.0–10.5)
nRBC: 0 % (ref 0.0–0.2)

## 2020-06-30 LAB — BASIC METABOLIC PANEL
Anion gap: 9 (ref 5–15)
BUN: 9 mg/dL (ref 8–23)
CO2: 28 mmol/L (ref 22–32)
Calcium: 9 mg/dL (ref 8.9–10.3)
Chloride: 101 mmol/L (ref 98–111)
Creatinine, Ser: 0.73 mg/dL (ref 0.61–1.24)
GFR, Estimated: >60 mL/min (ref >60)
Glucose, Bld: 103 mg/dL — ABNORMAL HIGH (ref 70–99)
Potassium: 4.1 mmol/L (ref 3.5–5.1)
Sodium: 138 mmol/L (ref 135–145)

## 2020-06-30 NOTE — ED Triage Notes (Signed)
Pt BIB by [facility] from home by family. Pt getting sponge bath and had 3-3m tonic clonic seizure. Hx seizures, not currently on medication. Post-ictal when EMS arrived, resolving. Dementia at baseline and hx stroke. Contracted. No obvious injuries, VSS  BP 142/94 HR 98 SpO2 96% CBG 162

## 2020-06-30 NOTE — ED Notes (Signed)
Daughter, she is okay with discharge. She will need him to have [facility]. Can you call her when Sharin Mons is here to take him

## 2020-06-30 NOTE — ED Notes (Signed)
[facility] contacted for discharge home

## 2020-06-30 NOTE — ED Notes (Signed)
Johnathan Mitchell, daughter called and aware of patient leaving by [facility] now.

## 2020-06-30 NOTE — ED Provider Notes (Signed)
[facility]-EMERGENCY DEPT Provider Note   CSN: [id number] Arrival date & time: 06/30/20  1418     History Chief Complaint  Patient presents with  . Seizures    Johnathan Mitchell. is a 86 y.o. male.   Seizures Seizure activity on arrival: no   Seizure type:  Unable to specify Initial focality:  Unable to specify Postictal symptoms: confusion and somnolence   Timing:  Once Progression:  Improving PTA treatment:  None      Past Medical History:  Diagnosis Date  . Asthma   . Dementia (HCC)   . Gout   . Hypertension   . Seizures (HCC)   . Stroke [facility])     Patient Active Problem List   Diagnosis Date Noted  . Cellulitis of left hand 12/15/2019  . Wrist pain 12/15/2019  . Closed left acetabular fracture (HCC) 12/14/2019  . SIRS (systemic inflammatory response syndrome) (HCC) 12/14/2019  . Protein-calorie malnutrition (HCC) 05/20/2017  . TIA (transient ischemic attack) 05/20/2017  . 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 Seizures (HCC) 05/19/2017  . AKI (acute kidney injury) (HCC) 05/12/2017  . Stroke (HCC) 05/11/2017  . Femur fracture (HCC) 05/11/2017  . Chest pain 02/09/2016  . HTN (hypertension) 02/09/2016  . Dementia with behavioral disturbance (HCC) 02/09/2016  . Chest pain at rest 02/09/2016  . Cerebral infarction (HCC) 02/01/2014  . Benign essential HTN 02/01/2014    Past Surgical History:  Procedure Laterality Date  . BACK SURGERY    . FEMUR IM NAIL Left 05/13/2017   Procedure: INTRAMEDULLARY (IM) NAIL FEMORAL;  Surgeon: [name], MD;  Location: [facility] OR;  Service: Orthopedics;  Laterality: Left;  . KNEE SURGERY         Family History  Problem Relation Age of Onset  . Hyperlipidemia Mother   . Hypertension Mother     Social History   Tobacco Use  . Smoking status: Former Games developer  . Smokeless tobacco: Never Used  Vaping Use  . Vaping Use: Never used  Substance Use Topics  . Alcohol use: No  . Drug use: No     Home Medications Prior to Admission medications   Medication Sig Start Date End Date Taking? Authorizing Provider  acetaminophen (TYLENOL) 500 MG tablet Take 1 tablet (500 mg total) by mouth every 8 (eight) hours. 12/20/19   Almon Hercules, MD  allopurinol (ZYLOPRIM) 100 MG tablet Take 100 mg by mouth daily. 02/17/20   [provider]  amLODipine (NORVASC) 10 MG tablet Take 1 tablet (10 mg total) by mouth daily. 12/21/19   Almon Hercules, MD  aspirin EC 81 MG tablet Take 1 tablet (81 mg total) by mouth daily. Swallow whole. 12/20/19 12/19/20  Almon Hercules, MD  atorvastatin (LIPITOR) 80 MG tablet Take 1 tablet (80 mg total) by mouth daily at 6 PM. 08/20/17   Medina-Vargas, Monina C, NP  bacitracin ointment Apply 1 application topically 2 (two) times daily. 03/12/20   Eber Hong, MD  bisacodyl (DULCOLAX) 5 MG EC tablet Take 1 tablet (5 mg total) by mouth daily as needed for moderate constipation. 12/20/19   Almon Hercules, MD  cephALEXin (KEFLEX) 500 MG capsule Take 1 capsule (500 mg total) by mouth 4 (four) times daily. 02/04/20   Elpidio Anis, PA-C  colchicine 0.6 MG tablet Take 1 tablet (0.6 mg total) by mouth daily. 02/04/20   Elpidio Anis, PA-C  HYDROPHILIC EX Apply 1 application topically 2 (two) times daily as needed (moiturizer).  [provider]  MENTHOL-METHYL SALICYLATE EX Apply 1 application topically 4 (four) times daily as needed (pain).    [provider]  Nutritional Supplements (,FEEDING SUPPLEMENT, PROSOURCE PLUS) liquid Take 30 mLs by mouth daily. 12/21/19   Almon Hercules, MD  senna-docusate (SENOKOT-S) 8.6-50 MG tablet Take 1 tablet by mouth 2 (two) times daily. 12/20/19   Almon Hercules, MD  traMADol (ULTRAM) 50 MG tablet Take 1 tablet (50 mg total) by mouth every 6 (six) hours as needed. 03/07/20   Lorre Nick, MD    Allergies    Penicillins and Lexapro [escitalopram]  Review of Systems   Review of Systems  Unable to perform ROS: Dementia   Neurological: Positive for seizures.    Physical Exam Updated Vital Signs BP (!) 135/100 (BP Location: Right Arm)   Pulse 82   Temp 97.7 F (36.5 C) (Oral)   Resp 20   SpO2 98%   Physical Exam Vitals and nursing note reviewed.  Constitutional:      General: He is not in acute distress.    Appearance: Normal appearance.  HENT:     Head: Normocephalic and atraumatic.     Nose: No rhinorrhea.  Eyes:     General:        Right eye: No discharge.        Left eye: No discharge.     Conjunctiva/sclera: Conjunctivae normal.  Cardiovascular:     Rate and Rhythm: Normal rate and regular rhythm.  Pulmonary:     Effort: Pulmonary effort is normal.     Breath sounds: No stridor.  Abdominal:     General: Abdomen is flat. There is no distension.     Palpations: Abdomen is soft.  Musculoskeletal:        General: No deformity or signs of injury.  Skin:    General: Skin is warm and dry.  Neurological:     Mental Status: He is alert.     Comments: Patient appears to have contracture in the left upper extremity is able to wiggle his fingers both hands.  Lifts both legs gently off the table, speaking mildly incoherent.  Psychiatric:        Mood and Affect: Mood normal.        Behavior: Behavior normal.        Thought Content: Thought content normal.     ED Results / Procedures / Treatments   Labs (all labs ordered are listed, but only abnormal results are displayed) Labs Reviewed  CBC WITH DIFFERENTIAL/PLATELET - Abnormal; Notable for the following components:      Result Value   Hemoglobin 11.3 (*)    HCT 36.4 (*)    MCH 24.8 (*)    RDW 17.2 (*)    All other components within normal limits  BASIC METABOLIC PANEL - Abnormal; Notable for the following components:   Glucose, Bld 103 (*)    All other components within normal limits    EKG None  Radiology CT Head Wo Contrast  Result Date: 06/30/2020 CLINICAL DATA:  Seizure EXAM: CT HEAD WITHOUT CONTRAST TECHNIQUE:  Contiguous axial images were obtained from the base of the skull through the vertex without intravenous contrast. COMPARISON:  12/14/2019 FINDINGS: Brain: Extensive chronic small vessel disease throughout the deep white matter. Diffuse cerebral atrophy. No acute intracranial abnormality. Specifically, no hemorrhage, hydrocephalus, mass lesion, acute infarction, or significant intracranial injury. Vascular: No hyperdense vessel or unexpected calcification. Skull: No acute calvarial abnormality. Sinuses/Orbits: No acute findings  Other: None IMPRESSION: Diffuse cerebral atrophy, extensive chronic small vessel disease. No acute intracranial abnormality. Electronically Signed   By: Charlett Nose M.D.   On: 06/30/2020 18:10    Procedures Procedures   Medications Ordered in ED Medications - No data to display  ED Course  I have reviewed the triage vital signs and the nursing notes.  Pertinent labs & imaging results that were available during my care of the patient were reviewed by me and considered in my medical decision making (see chart for details).    MDM Rules/Calculators/A&P                          Reported history of seizures.  Seizure today reported to have lasted 3 to 5 minutes.  Typical patient seizure behavior.  Patient cannot recount he has a history of dementia.  Per what is described by EMS he is back to his baseline of dementia.  He has a history of stroke and seizures as well.  He is not on medications.  I am unable to contact family for collateral information.  I will get a CT scan basic labs and reassess this patient.  Currently his vital signs are hemodynamically stable and he is well-appearing he is comfortable and reports no pain  CT imaging read by radiology myself is unremarkable her laboratory studies were reviewed by myself are unremarkable.  Spoke to the family via patient follow-up for possible seizure control.  They agree.  Outpatient follow-up recommended return precautions  discussed Final Clinical Impression(s) / ED Diagnoses Final diagnoses:  Seizure [facility])    Rx / DC Orders ED Discharge Orders    None       Sabino Donovan, MD 06/30/20 [phone]

## 2020-07-31 ENCOUNTER — Emergency Department (HOSPITAL_BASED_OUTPATIENT_CLINIC_OR_DEPARTMENT_OTHER)
Admission: EM | Admit: 2020-07-31 | Discharge: 2020-07-31 | Disposition: A | Discharge status: Home / Self Care | Payer: No Typology Code available for payment source | Attending: Emergency Medicine | Admitting: Emergency Medicine

## 2020-07-31 ENCOUNTER — Other Ambulatory Visit: Payer: Self-pay

## 2020-07-31 ENCOUNTER — Encounter (HOSPITAL_BASED_OUTPATIENT_CLINIC_OR_DEPARTMENT_OTHER): Payer: Self-pay

## 2020-07-31 ENCOUNTER — Emergency Department (HOSPITAL_BASED_OUTPATIENT_CLINIC_OR_DEPARTMENT_OTHER): Payer: No Typology Code available for payment source

## 2020-07-31 DIAGNOSIS — Z79899 Other long term (current) drug therapy: Secondary | ICD-10-CM | POA: Diagnosis not present

## 2020-07-31 DIAGNOSIS — I10 Essential (primary) hypertension: Secondary | ICD-10-CM | POA: Insufficient documentation

## 2020-07-31 DIAGNOSIS — F0391 Unspecified dementia with behavioral disturbance: Secondary | ICD-10-CM | POA: Insufficient documentation

## 2020-07-31 DIAGNOSIS — M79642 Pain in left hand: Secondary | ICD-10-CM | POA: Diagnosis present

## 2020-07-31 DIAGNOSIS — Z7982 Long term (current) use of aspirin: Secondary | ICD-10-CM | POA: Insufficient documentation

## 2020-07-31 DIAGNOSIS — G8929 Other chronic pain: Secondary | ICD-10-CM | POA: Insufficient documentation

## 2020-07-31 DIAGNOSIS — M79672 Pain in left foot: Secondary | ICD-10-CM | POA: Diagnosis not present

## 2020-07-31 DIAGNOSIS — Z87891 Personal history of nicotine dependence: Secondary | ICD-10-CM | POA: Insufficient documentation

## 2020-07-31 DIAGNOSIS — J45909 Unspecified asthma, uncomplicated: Secondary | ICD-10-CM | POA: Diagnosis not present

## 2020-07-31 MED ORDER — ACETAMINOPHEN 325 MG PO TABS
650.0000 mg | ORAL_TABLET | Freq: Once | ORAL | Status: AC
  Administered 2020-07-31: 650 mg via ORAL
  Filled 2020-07-31: qty 2

## 2020-07-31 NOTE — ED Triage Notes (Addendum)
Pt brought in by EMS for bilateral foot pain for unknown amount of time. When patient is asked he states, "its been a long time that I have been hurting". Pt is a poor historian. Per EMS patient is non-compliant with medications.  Hx of dementia and is bed bound.

## 2020-07-31 NOTE — ED Provider Notes (Signed)
[facility] EMERGENCY DEPT Provider Note   CSN: [id number] Arrival date & time: 07/31/20  0001     History Chief Complaint  Patient presents with   Foot Pain    Johnathan Mitchell. is a 86 y.o. male.  HPI     This is an 86 year old male with a history of dementia, stroke, seizures, hypertension who presents with pain.  Per EMS report, is having increasing pain.  No additional collateral information provided.  On my evaluation, patient reports "pain all over for some time."  He is disoriented and is only oriented to himself.  He is unable to provide history.  Level 5 caveat.  I did speak with the patient's daughter who he lives with.  She reports he has had ongoing left hand and left foot pain.  She reports history of RA.  They have given him Tylenol with no relief.  No noted recent injuries and otherwise no change in pain.  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   Seizures (HCC) 05/19/2017   AKI (acute kidney injury) (HCC) 05/12/2017   Stroke (HCC) 05/11/2017   Femur fracture (HCC) 05/11/2017   Chest pain 02/09/2016   HTN (hypertension) 02/09/2016   Dementia with behavioral disturbance (HCC) 02/09/2016   Chest pain at rest 02/09/2016   Cerebral infarction (HCC) 02/01/2014   Benign essential HTN 02/01/2014    Past Surgical History:  Procedure Laterality Date   BACK SURGERY     FEMUR IM NAIL Left 05/13/2017   Procedure: INTRAMEDULLARY (IM) NAIL FEMORAL;  Surgeon: [name], MD;  Location: [facility] OR;   Service: Orthopedics;  Laterality: Left;   KNEE SURGERY         Family History  Problem Relation Age of Onset   Hyperlipidemia Mother    Hypertension Mother     Social History   Tobacco Use   Smoking status: Former    Pack years: 0.00   Smokeless tobacco: Never  Vaping Use   Vaping Use: Never used  Substance Use Topics   Alcohol use: No   Drug use: No    Home Medications Prior to Admission medications   Medication Sig Start Date End Date Taking? Authorizing Provider  acetaminophen (TYLENOL) 500 MG tablet Take 1 tablet (500 mg total) by mouth every 8 (eight) hours. 12/20/19   Almon Hercules, MD  allopurinol (ZYLOPRIM) 100 MG tablet Take 100 mg by mouth daily. 02/17/20   [provider]  amLODipine (NORVASC) 10 MG tablet Take 1 tablet (10 mg total) by mouth daily. 12/21/19   Almon Hercules, MD  aspirin EC 81 MG tablet Take 1 tablet (81 mg total) by mouth daily. Swallow whole. 12/20/19 12/19/20  Almon Hercules, MD  atorvastatin (LIPITOR) 80 MG tablet Take 1 tablet (80 mg total) by mouth daily at 6 PM. 08/20/17   Medina-Vargas, Monina C, NP  bacitracin ointment Apply 1 application topically 2 (two) times daily. 03/12/20   Eber Hong, MD  bisacodyl (DULCOLAX) 5  MG EC tablet Take 1 tablet (5 mg total) by mouth daily as needed for moderate constipation. 12/20/19   Almon Hercules, MD  cephALEXin (KEFLEX) 500 MG capsule Take 1 capsule (500 mg total) by mouth 4 (four) times daily. 02/04/20   Elpidio Anis, PA-C  colchicine 0.6 MG tablet Take 1 tablet (0.6 mg total) by mouth daily. 02/04/20   Elpidio Anis, PA-C  HYDROPHILIC EX Apply 1 application topically 2 (two) times daily as needed (moiturizer).    [provider]  MENTHOL-METHYL SALICYLATE EX Apply 1 application topically 4 (four) times daily as needed (pain).    [provider]  Nutritional Supplements (,FEEDING SUPPLEMENT, PROSOURCE PLUS) liquid Take 30 mLs by mouth daily. 12/21/19   Almon Hercules, MD   senna-docusate (SENOKOT-S) 8.6-50 MG tablet Take 1 tablet by mouth 2 (two) times daily. 12/20/19   Almon Hercules, MD  traMADol (ULTRAM) 50 MG tablet Take 1 tablet (50 mg total) by mouth every 6 (six) hours as needed. 03/07/20   Lorre Nick, MD    Allergies    Penicillins and Lexapro [escitalopram]  Review of Systems   Review of Systems  Unable to perform ROS: Dementia   Physical Exam Updated Vital Signs BP (!) 140/92   Pulse 87   Temp 98.7 F (37.1 C) (Oral)   Resp 17   Ht 1.676 m (5\' 6" )   Wt 60.1 kg   SpO2 97%   BMI 21.39 kg/m   Physical Exam Vitals and nursing note reviewed.  Constitutional:      Appearance: He is well-developed.     Comments: Elderly, nontoxic-appearing  HENT:     Head: Normocephalic and atraumatic.     Mouth/Throat:     Mouth: Mucous membranes are dry.  Eyes:     Pupils: Pupils are equal, round, and reactive to light.  Cardiovascular:     Rate and Rhythm: Normal rate and regular rhythm.     Heart sounds: Normal heart sounds. No murmur heard. Pulmonary:     Effort: Pulmonary effort is normal. No respiratory distress.     Breath sounds: Normal breath sounds. No wheezing.  Abdominal:     General: Bowel sounds are normal.     Palpations: Abdomen is soft.     Tenderness: There is no abdominal tenderness. There is no rebound.  Musculoskeletal:     Cervical back: Neck supple.     Comments: Contracture left upper extremity at the wrist, no obvious deformities, 2+ radial pulse, no swelling or deformity noted to bilateral feet or ankles, no overlying skin changes, erythema  Lymphadenopathy:     Cervical: No cervical adenopathy.  Skin:    General: Skin is warm and dry.  Neurological:     Mental Status: He is alert.     Comments: Oriented only to self, decree strength left upper and lower extremity  Psychiatric:     Comments: Unable to assess    ED Results / Procedures / Treatments   Labs (all labs ordered are listed, but only abnormal results  are displayed) Labs Reviewed - No data to display  EKG None  Radiology DG Ankle Complete Left  Result Date: 07/31/2020 CLINICAL DATA:  Bilateral foot pain EXAM: LEFT ANKLE COMPLETE - 3+ VIEW COMPARISON:  None. FINDINGS: Limited evaluation due to obliquity. No fracture or dislocation is seen. Mortise is essentially preserved. Mild degenerative changes of the dorsal midfoot. Visualized soft tissues are within normal limits. IMPRESSION: Limited evaluation due to obliquity. No fracture  or dislocation is seen. Mild degenerative changes of the dorsal midfoot. Electronically Signed   By: Charline Bills M.D.   On: 07/31/2020 01:48   DG Hand Complete Left  Result Date: 07/31/2020 CLINICAL DATA:  Pain, dementia, gout EXAM: LEFT HAND - COMPLETE 3+ VIEW COMPARISON:  02/03/2020 FINDINGS: Markedly limited evaluation due to flexed positioning of the wrist and digits, similar to the prior. No gross fracture is seen. Visualized soft tissues are within normal limits. IMPRESSION: Markedly limited evaluation due to flexed positioning of the wrist and digits, likely chronic when correlating with the prior. No gross fracture is seen. Electronically Signed   By: Charline Bills M.D.   On: 07/31/2020 01:47    Procedures Procedures   Medications Ordered in ED Medications  acetaminophen (TYLENOL) tablet 650 mg (650 mg Oral Given 07/31/20 0124)    ED Course  I have reviewed the triage vital signs and the nursing notes.  Pertinent labs & imaging results that were available during my care of the patient were reviewed by me and considered in my medical decision making (see chart for details).  Clinical Course as of 07/31/20 0153  Mon Jul 31, 2020  0100 Spoke with patient's daughter, Marijean Niemann.  She states they called EMS because he is having ongoing left hand and left foot pain.  She states that he has a history of rheumatoid arthritis and they give him Tylenol but the pain does not seem to get any better.  This has  been an ongoing issue.  She has no other concerns. [CH]    Clinical Course User Index [CH] Stpehen Petitjean, Mayer Masker, MD   MDM Rules/Calculators/A&P                          Patient presents with reported pain in the left hand and foot.  Per the patient's daughter this is ongoing pain and not acute.  No recent injuries.  Is chronically ill-appearing and in no acute distress.  Vital signs are reassuring.  He has evidence of contracture of the left upper extremity.  No open wounds.  No redness or erythema.  Normal range of motion of the ankles.  Did not note any deformities.  Low suspicion for acute fracture although the patient is bedbound and has dementia.  For this reason, will obtain x-rays.  X-rays reviewed by myself and show no evidence of acute fracture.  I have low suspicion for etiologies such as septic arthritis or inflammatory arthritis.  Recommend ongoing Tylenol for pain and follow-up with primary physician for ongoing needs.  After history, exam, and medical workup I feel the patient has been appropriately medically screened and is safe for discharge home. Pertinent diagnoses were discussed with the patient. Patient was given return precautions.  Final Clinical Impression(s) / ED Diagnoses Final diagnoses:  Other chronic pain    Rx / DC Orders ED Discharge Orders     None        Daytona Retana, Mayer Masker, MD 07/31/20 [phone]

## 2020-07-31 NOTE — Discharge Instructions (Addendum)
You were seen today for hand and foot pain.  Your x-rays do not show any acute injury or fracture.  Continue Tylenol at home for pain control.  Follow-up with your primary doctor for ongoing needs.

## 2020-08-08 ENCOUNTER — Encounter (HOSPITAL_BASED_OUTPATIENT_CLINIC_OR_DEPARTMENT_OTHER): Payer: Self-pay | Admitting: Emergency Medicine

## 2020-08-08 ENCOUNTER — Other Ambulatory Visit: Payer: Self-pay

## 2020-08-08 ENCOUNTER — Emergency Department (HOSPITAL_BASED_OUTPATIENT_CLINIC_OR_DEPARTMENT_OTHER)
Admission: EM | Admit: 2020-08-08 | Discharge: 2020-08-09 | Disposition: A | Discharge status: Home / Self Care | Payer: No Typology Code available for payment source | Attending: Emergency Medicine | Admitting: Emergency Medicine

## 2020-08-08 DIAGNOSIS — Z79899 Other long term (current) drug therapy: Secondary | ICD-10-CM | POA: Insufficient documentation

## 2020-08-08 DIAGNOSIS — Z87891 Personal history of nicotine dependence: Secondary | ICD-10-CM | POA: Insufficient documentation

## 2020-08-08 DIAGNOSIS — R410 Disorientation, unspecified: Secondary | ICD-10-CM | POA: Insufficient documentation

## 2020-08-08 DIAGNOSIS — R000 Tachycardia, unspecified: Secondary | ICD-10-CM | POA: Insufficient documentation

## 2020-08-08 DIAGNOSIS — G40909 Epilepsy, unspecified, not intractable, without status epilepticus: Secondary | ICD-10-CM | POA: Insufficient documentation

## 2020-08-08 DIAGNOSIS — I10 Essential (primary) hypertension: Secondary | ICD-10-CM | POA: Insufficient documentation

## 2020-08-08 DIAGNOSIS — R569 Unspecified convulsions: Secondary | ICD-10-CM | POA: Diagnosis present

## 2020-08-08 DIAGNOSIS — J45909 Unspecified asthma, uncomplicated: Secondary | ICD-10-CM | POA: Diagnosis not present

## 2020-08-08 DIAGNOSIS — F0390 Unspecified dementia without behavioral disturbance: Secondary | ICD-10-CM | POA: Diagnosis not present

## 2020-08-08 DIAGNOSIS — Z7982 Long term (current) use of aspirin: Secondary | ICD-10-CM | POA: Diagnosis not present

## 2020-08-08 LAB — COMPREHENSIVE METABOLIC PANEL
ALT: 5 U/L (ref 0–44)
AST: 10 U/L — ABNORMAL LOW (ref 15–41)
Albumin: 3.4 g/dL — ABNORMAL LOW (ref 3.5–5.0)
Alkaline Phosphatase: 68 U/L (ref 38–126)
Anion gap: 13 (ref 5–15)
BUN: 16 mg/dL (ref 8–23)
CO2: 30 mmol/L (ref 22–32)
Calcium: 9.7 mg/dL (ref 8.9–10.3)
Chloride: 96 mmol/L — ABNORMAL LOW (ref 98–111)
Creatinine, Ser: 0.7 mg/dL (ref 0.61–1.24)
GFR, Estimated: >60 mL/min (ref >60)
Glucose, Bld: 133 mg/dL — ABNORMAL HIGH (ref 70–99)
Potassium: 3.7 mmol/L (ref 3.5–5.1)
Sodium: 139 mmol/L (ref 135–145)
Total Bilirubin: 0.3 mg/dL (ref 0.3–1.2)
Total Protein: 8.1 g/dL (ref 6.5–8.1)

## 2020-08-08 LAB — CBC WITH DIFFERENTIAL/PLATELET
Abs Immature Granulocytes: 0.03 10*3/uL (ref 0.00–0.07)
Basophils Absolute: 0.1 10*3/uL (ref 0.0–0.1)
Basophils Relative: 1 %
Eosinophils Absolute: 0.1 10*3/uL (ref 0.0–0.5)
Eosinophils Relative: 1 %
HCT: 36.9 % — ABNORMAL LOW (ref 39.0–52.0)
Hemoglobin: 11.3 g/dL — ABNORMAL LOW (ref 13.0–17.0)
Immature Granulocytes: 0 %
Lymphocytes Relative: 18 %
Lymphs Abs: 1.7 10*3/uL (ref 0.7–4.0)
MCH: 23.9 pg — ABNORMAL LOW (ref 26.0–34.0)
MCHC: 30.6 g/dL (ref 30.0–36.0)
MCV: 78.2 fL — ABNORMAL LOW (ref 80.0–100.0)
Monocytes Absolute: 0.8 10*3/uL (ref 0.1–1.0)
Monocytes Relative: 8 %
Neutro Abs: 6.5 10*3/uL (ref 1.7–7.7)
Neutrophils Relative %: 72 %
Platelets: 283 10*3/uL (ref 150–400)
RBC: 4.72 MIL/uL (ref 4.22–5.81)
RDW: 17.1 % — ABNORMAL HIGH (ref 11.5–15.5)
WBC: 9.1 10*3/uL (ref 4.0–10.5)
nRBC: 0 % (ref 0.0–0.2)

## 2020-08-08 MED ORDER — LEVETIRACETAM IN NACL 1000 MG/100ML IV SOLN
1000.0000 mg | Freq: Once | INTRAVENOUS | Status: AC
  Administered 2020-08-08: 1000 mg via INTRAVENOUS
  Filled 2020-08-08: qty 100

## 2020-08-08 MED ORDER — LEVETIRACETAM 500 MG PO TABS: 500.0000 mg | ORAL_TABLET | Freq: Two times a day (BID) | ORAL | 0 refills | Status: AC

## 2020-08-08 MED ORDER — SODIUM CHLORIDE 0.9 % IV SOLN: INTRAVENOUS | Status: DC

## 2020-08-08 NOTE — ED Notes (Signed)
Pt altered, unable to sign MSE

## 2020-08-08 NOTE — ED Provider Notes (Signed)
[facility] EMERGENCY DEPT Provider Note   CSN: [id number] Arrival date & time: 08/08/20  1953     History Chief Complaint  Patient presents with   Seizures    Marquis Down. is a 86 y.o. male.  86 year old male who presents after having witnessed seizure at home by his caregiver.  He does have a history of dementia as well as seizure disorder.  Was seen here about a month ago for similar symptoms and was supposed follow-up with neurology but according to his daughter who I spoke with the evening he was able to do this.  Has been otherwise at his baseline state of health.  Seizure lasted approximate 30 seconds he is now back to baseline.      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   Seizures (HCC) 05/19/2017   AKI (acute kidney injury) (HCC) 05/12/2017   Stroke (HCC) 05/11/2017   Femur fracture (HCC) 05/11/2017   Chest pain 02/09/2016   HTN (hypertension) 02/09/2016   Dementia with behavioral disturbance (HCC) 02/09/2016   Chest pain at rest 02/09/2016   Cerebral infarction (HCC) 02/01/2014   Benign essential HTN 02/01/2014    Past Surgical History:  Procedure Laterality Date   BACK SURGERY     FEMUR IM NAIL Left 05/13/2017   Procedure: INTRAMEDULLARY (IM) NAIL FEMORAL;  Surgeon: [name], MD;  Location: [facility] OR;  Service: Orthopedics;  Laterality: Left;   KNEE SURGERY         Family History  Problem Relation Age of Onset   Hyperlipidemia Mother    Hypertension Mother     Social  History   Tobacco Use   Smoking status: Former    Pack years: 0.00   Smokeless tobacco: Never  Vaping Use   Vaping Use: Never used  Substance Use Topics   Alcohol use: No   Drug use: No    Home Medications Prior to Admission medications   Medication Sig Start Date End Date Taking? Authorizing Provider  acetaminophen (TYLENOL) 500 MG tablet Take 1 tablet (500 mg total) by mouth every 8 (eight) hours. 12/20/19   Almon Hercules, MD  allopurinol (ZYLOPRIM) 100 MG tablet Take 100 mg by mouth daily. 02/17/20   [provider]  amLODipine (NORVASC) 10 MG tablet Take 1 tablet (10 mg total) by mouth daily. 12/21/19   Almon Hercules, MD  aspirin EC 81 MG tablet Take 1 tablet (81 mg total) by mouth daily. Swallow whole. 12/20/19 12/19/20  Almon Hercules, MD  atorvastatin (LIPITOR) 80 MG tablet Take 1 tablet (80 mg total) by mouth daily at 6 PM. 08/20/17   Medina-Vargas, Monina C, NP  bacitracin ointment Apply 1 application topically 2 (two) times daily. 03/12/20   Eber Hong, MD  bisacodyl (DULCOLAX) 5 MG EC tablet Take 1 tablet (5 mg total) by mouth daily as needed for moderate constipation. 12/20/19   Almon Hercules, MD  cephALEXin (KEFLEX) 500 MG capsule Take 1 capsule (500 mg  total) by mouth 4 (four) times daily. 02/04/20   Elpidio Anis, PA-C  colchicine 0.6 MG tablet Take 1 tablet (0.6 mg total) by mouth daily. 02/04/20   Elpidio Anis, PA-C  HYDROPHILIC EX Apply 1 application topically 2 (two) times daily as needed (moiturizer).    [provider]  MENTHOL-METHYL SALICYLATE EX Apply 1 application topically 4 (four) times daily as needed (pain).    [provider]  Nutritional Supplements (,FEEDING SUPPLEMENT, PROSOURCE PLUS) liquid Take 30 mLs by mouth daily. 12/21/19   Almon Hercules, MD  senna-docusate (SENOKOT-S) 8.6-50 MG tablet Take 1 tablet by mouth 2 (two) times daily. 12/20/19   Almon Hercules, MD  traMADol (ULTRAM) 50 MG tablet Take 1 tablet (50 mg total) by mouth  every 6 (six) hours as needed. 03/07/20   Lorre Nick, MD    Allergies    Penicillins and Lexapro [escitalopram]  Review of Systems   Review of Systems  Unable to perform ROS: Dementia   Physical Exam Updated Vital Signs BP (!) 129/96   Pulse (!) 124   Temp 98.7 F (37.1 C)   Resp 18   Ht 1.676 m (5\' 6" )   Wt 60.1 kg   SpO2 100%   BMI 21.39 kg/m   Physical Exam Vitals and nursing note reviewed.  Constitutional:      General: He is not in acute distress.    Appearance: Normal appearance. He is well-developed. He is not toxic-appearing.  HENT:     Head: Normocephalic and atraumatic.  Eyes:     General: Lids are normal.     Conjunctiva/sclera: Conjunctivae normal.     Pupils: Pupils are equal, round, and reactive to light.  Neck:     Thyroid: No thyroid mass.     Trachea: No tracheal deviation.  Cardiovascular:     Rate and Rhythm: Regular rhythm. Tachycardia present.     Heart sounds: Normal heart sounds. No murmur heard.   No gallop.  Pulmonary:     Effort: Pulmonary effort is normal. No respiratory distress.     Breath sounds: Normal breath sounds. No stridor. No decreased breath sounds, wheezing, rhonchi or rales.  Abdominal:     General: There is no distension.     Palpations: Abdomen is soft.     Tenderness: There is no abdominal tenderness. There is no rebound.  Musculoskeletal:        General: No tenderness. Normal range of motion.     Cervical back: Normal range of motion and neck supple.  Skin:    General: Skin is warm and dry.     Findings: No abrasion or rash.  Neurological:     Mental Status: He is alert. Mental status is at baseline. He is disoriented.     GCS: GCS eye subscore is 4. GCS verbal subscore is 5. GCS motor subscore is 6.     Cranial Nerves: No facial asymmetry.     Sensory: No sensory deficit.     Motor: No tremor.     Comments: Left upper extremity contracture noted but withdraws to pain in all 4 extremities  Psychiatric:         Attention and Perception: Attention normal.        Speech: Speech normal.        Behavior: Behavior normal.    ED Results / Procedures / Treatments   Labs (all labs ordered are listed, but only abnormal results are displayed) Labs Reviewed  CBC WITH DIFFERENTIAL/PLATELET  COMPREHENSIVE METABOLIC PANEL    EKG None  Radiology No results found.  Procedures Procedures   Medications Ordered in ED Medications  levETIRAcetam (KEPPRA) IVPB 1000 mg/100 mL premix (has no administration in time range)  0.9 %  sodium chloride infusion (has no administration in time range)    ED Course  I have reviewed the triage vital signs and the nursing notes.  Pertinent labs & imaging results that were available during my care of the patient were reviewed by me and considered in my medical decision making (see chart for details).    MDM Rules/Calculators/A&P                          Patient better neurological baseline.  Loaded with Keppra here and will place on same and discussed with daughter about the importance of following up with a neurologist. Final Clinical Impression(s) / ED Diagnoses Final diagnoses:  None    Rx / DC Orders ED Discharge Orders     None        Lorre Nick, MD 08/08/20 2205

## 2020-08-08 NOTE — ED Triage Notes (Signed)
[facility] - pt from home with c/o 30 seconds grand mal seizure. Pt at baseline per ems now. Pt has hx of seizure

## 2020-08-08 NOTE — Discharge Instructions (Addendum)
Make sure you follow-up with your neurologist

## 2020-12-10 ENCOUNTER — Emergency Department (HOSPITAL_COMMUNITY): Payer: No Typology Code available for payment source

## 2020-12-10 ENCOUNTER — Emergency Department (HOSPITAL_COMMUNITY)
Admission: EM | Admit: 2020-12-10 | Discharge: 2020-12-11 | Disposition: A | Discharge status: Home / Self Care | Payer: No Typology Code available for payment source | Attending: Emergency Medicine | Admitting: Emergency Medicine

## 2020-12-10 ENCOUNTER — Other Ambulatory Visit: Payer: Self-pay

## 2020-12-10 DIAGNOSIS — Z7982 Long term (current) use of aspirin: Secondary | ICD-10-CM | POA: Diagnosis not present

## 2020-12-10 DIAGNOSIS — Z79899 Other long term (current) drug therapy: Secondary | ICD-10-CM | POA: Insufficient documentation

## 2020-12-10 DIAGNOSIS — G40909 Epilepsy, unspecified, not intractable, without status epilepticus: Secondary | ICD-10-CM | POA: Diagnosis present

## 2020-12-10 DIAGNOSIS — R569 Unspecified convulsions: Secondary | ICD-10-CM

## 2020-12-10 DIAGNOSIS — Z87891 Personal history of nicotine dependence: Secondary | ICD-10-CM | POA: Diagnosis not present

## 2020-12-10 DIAGNOSIS — I10 Essential (primary) hypertension: Secondary | ICD-10-CM | POA: Insufficient documentation

## 2020-12-10 DIAGNOSIS — J45909 Unspecified asthma, uncomplicated: Secondary | ICD-10-CM | POA: Diagnosis not present

## 2020-12-10 DIAGNOSIS — F0390 Unspecified dementia without behavioral disturbance: Secondary | ICD-10-CM | POA: Diagnosis not present

## 2020-12-10 LAB — COMPREHENSIVE METABOLIC PANEL
ALT: 12 U/L (ref 0–44)
AST: 17 U/L (ref 15–41)
Albumin: 2.8 g/dL — ABNORMAL LOW (ref 3.5–5.0)
Alkaline Phosphatase: 67 U/L (ref 38–126)
Anion gap: 8 (ref 5–15)
BUN: 14 mg/dL (ref 8–23)
CO2: 28 mmol/L (ref 22–32)
Calcium: 9.1 mg/dL (ref 8.9–10.3)
Chloride: 103 mmol/L (ref 98–111)
Creatinine, Ser: 0.53 mg/dL — ABNORMAL LOW (ref 0.61–1.24)
GFR, Estimated: >60 mL/min (ref >60)
Glucose, Bld: 74 mg/dL (ref 70–99)
Potassium: 4.3 mmol/L (ref 3.5–5.1)
Sodium: 139 mmol/L (ref 135–145)
Total Bilirubin: 0.5 mg/dL (ref 0.3–1.2)
Total Protein: 7.4 g/dL (ref 6.5–8.1)

## 2020-12-10 LAB — URINALYSIS, ROUTINE W REFLEX MICROSCOPIC
Bilirubin Urine: NEGATIVE
Glucose, UA: NEGATIVE mg/dL
Ketones, ur: NEGATIVE mg/dL
Leukocytes,Ua: NEGATIVE
Nitrite: NEGATIVE
Protein, ur: 30 mg/dL — AB
Specific Gravity, Urine: 1.021 (ref 1.005–1.030)
pH: 6 (ref 5.0–8.0)

## 2020-12-10 LAB — CBG MONITORING, ED: Glucose-Capillary: 85 mg/dL (ref 70–99)

## 2020-12-10 LAB — CBC WITH DIFFERENTIAL/PLATELET
Abs Immature Granulocytes: 0.03 10*3/uL (ref 0.00–0.07)
Basophils Absolute: 0 10*3/uL (ref 0.0–0.1)
Basophils Relative: 1 %
Eosinophils Absolute: 0 10*3/uL (ref 0.0–0.5)
Eosinophils Relative: 0 %
HCT: 36.4 % — ABNORMAL LOW (ref 39.0–52.0)
Hemoglobin: 11.6 g/dL — ABNORMAL LOW (ref 13.0–17.0)
Immature Granulocytes: 0 %
Lymphocytes Relative: 21 %
Lymphs Abs: 1.7 10*3/uL (ref 0.7–4.0)
MCH: 25.8 pg — ABNORMAL LOW (ref 26.0–34.0)
MCHC: 31.9 g/dL (ref 30.0–36.0)
MCV: 81.1 fL (ref 80.0–100.0)
Monocytes Absolute: 0.6 10*3/uL (ref 0.1–1.0)
Monocytes Relative: 7 %
Neutro Abs: 5.5 10*3/uL (ref 1.7–7.7)
Neutrophils Relative %: 71 %
Platelets: 262 10*3/uL (ref 150–400)
RBC: 4.49 MIL/uL (ref 4.22–5.81)
RDW: 17.6 % — ABNORMAL HIGH (ref 11.5–15.5)
WBC: 7.9 10*3/uL (ref 4.0–10.5)
nRBC: 0 % (ref 0.0–0.2)

## 2020-12-10 MED ORDER — LEVETIRACETAM IN NACL 1000 MG/100ML IV SOLN
1000.0000 mg | Freq: Once | INTRAVENOUS | Status: AC
  Administered 2020-12-10: 1000 mg via INTRAVENOUS
  Filled 2020-12-10: qty 100

## 2020-12-10 NOTE — ED Provider Notes (Signed)
[facility] DEPT Provider Note   CSN: [id number] Arrival date & time: 12/10/20  1830     History Chief Complaint  Patient presents with   Seizures    Johnathan Mitchell. is a 86 y.o. male.  The history is provided by the patient, the EMS personnel and medical records (ems report to nursing). The history is limited by the condition of the patient. No language interpreter was used.  Seizures Seizure activity on arrival: no   Seizure type:  Tonic and grand mal Preceding symptoms: no headache and no nausea   Initial focality:  None Episode characteristics: generalized shaking and stiffening   Postictal symptoms: confusion and somnolence   Return to baseline: yes   Severity:  Moderate Duration:  4 minutes Timing:  Once Progression:  Resolved Context: not alcohol withdrawal and not change in medication   Context comment:  Missed food this morning Recent head injury:  No recent head injuries PTA treatment:  None History of seizures: yes       Past Medical History:  Diagnosis Date   Asthma    Dementia ([facility])    Gout    Hypertension    Seizures ([place])    Stroke [facility])     Patient Active Problem List   Diagnosis Date Noted   Cellulitis of left hand 12/15/2019   Wrist pain 12/15/2019   Closed left acetabular fracture ([place]) 12/14/2019   SIRS (systemic inflammatory response syndrome) ([place]) 12/14/2019   Protein-calorie malnutrition ([place]) 05/20/2017   TIA (transient ischemic attack) 05/20/2017   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   Seizures ([place]) 05/19/2017   AKI (acute kidney injury) ([place]) 05/12/2017   Stroke ([place]) 05/11/2017   Femur fracture ([place]) 05/11/2017   Chest pain 02/09/2016   HTN (hypertension) 02/09/2016   Dementia with behavioral disturbance ([place]) 02/09/2016   Chest pain at rest 02/09/2016   Cerebral infarction ([place]) 02/01/2014   Benign essential HTN 02/01/2014    Past Surgical History:  Procedure Laterality Date    BACK SURGERY     FEMUR IM NAIL Left 05/13/2017   Procedure: INTRAMEDULLARY (IM) NAIL FEMORAL;  Surgeon: Altamese Mountain Green, MD;  Location: [facility];  Service: Orthopedics;  Laterality: Left;   KNEE SURGERY         Family History  Problem Relation Age of Onset   Hyperlipidemia Mother    Hypertension Mother     Social History   Tobacco Use   Smoking status: Former   Smokeless tobacco: Never  Scientific laboratory technician Use: Never used  Substance Use Topics   Alcohol use: No   Drug use: No    Home Medications Prior to Admission medications   Medication Sig Start Date End Date Taking? Authorizing Provider  acetaminophen (TYLENOL) 500 MG tablet Take 1 tablet (500 mg total) by mouth every 8 (eight) hours. 12/20/19   Mercy Riding, MD  allopurinol (ZYLOPRIM) 100 MG tablet Take 100 mg by mouth daily. 02/17/20   [provider]  amLODipine (NORVASC) 10 MG tablet Take 1 tablet (10 mg total) by mouth daily. 12/21/19   Mercy Riding, MD  aspirin EC 81 MG tablet Take 1 tablet (81 mg total) by mouth daily. Swallow whole. 12/20/19 12/19/20  Mercy Riding, MD  atorvastatin (LIPITOR) 80 MG tablet Take 1 tablet (80 mg total) by mouth daily at 6 PM. 08/20/17   Medina-Vargas, Monina C, NP  bacitracin ointment Apply 1 application topically 2 (two) times daily. 03/12/20  Noemi Chapel, MD  bisacodyl (DULCOLAX) 5 MG EC tablet Take 1 tablet (5 mg total) by mouth daily as needed for moderate constipation. 12/20/19   Mercy Riding, MD  cephALEXin (KEFLEX) 500 MG capsule Take 1 capsule (500 mg total) by mouth 4 (four) times daily. 02/04/20   Charlann Lange, PA-C  colchicine 0.6 MG tablet Take 1 tablet (0.6 mg total) by mouth daily. 02/04/20   Charlann Lange, PA-C  HYDROPHILIC EX Apply 1 application topically 2 (two) times daily as needed (moiturizer).    [provider]  levETIRAcetam (KEPPRA) 500 MG tablet Take 1 tablet (500 mg total) by mouth 2 (two) times daily. 08/08/20   Lacretia Leigh, MD   MENTHOL-METHYL SALICYLATE EX Apply 1 application topically 4 (four) times daily as needed (pain).    [provider]  Nutritional Supplements (,FEEDING SUPPLEMENT, PROSOURCE PLUS) liquid Take 30 mLs by mouth daily. 12/21/19   Mercy Riding, MD  senna-docusate (SENOKOT-S) 8.6-50 MG tablet Take 1 tablet by mouth 2 (two) times daily. 12/20/19   Mercy Riding, MD  traMADol (ULTRAM) 50 MG tablet Take 1 tablet (50 mg total) by mouth every 6 (six) hours as needed. 03/07/20   Lacretia Leigh, MD    Allergies    Penicillins and Lexapro [escitalopram]  Review of Systems   Review of Systems  Constitutional:  Negative for chills, diaphoresis, fatigue and fever.  HENT:  Negative for congestion.   Eyes:  Negative for visual disturbance.  Respiratory:  Negative for cough, chest tightness, shortness of breath and wheezing.   Cardiovascular:  Negative for chest pain.  Gastrointestinal:  Negative for abdominal pain, constipation, diarrhea, nausea and vomiting.  Genitourinary:  Negative for dysuria, flank pain and frequency.  Musculoskeletal:  Negative for back pain, neck pain and neck stiffness.  Skin:  Negative for rash and wound.  Neurological:  Positive for seizures. Negative for dizziness, weakness, light-headedness, numbness and headaches.  Psychiatric/Behavioral:  Negative for agitation and confusion.   All other systems reviewed and are negative.  Physical Exam Updated Vital Signs BP (!) 137/108 (BP Location: Right Arm)   Pulse 79   Temp 99.6 F (37.6 C) (Oral)   Resp 12   SpO2 93%   Physical Exam Vitals and nursing note reviewed.  Constitutional:      General: He is not in acute distress.    Appearance: He is well-developed. He is not ill-appearing, toxic-appearing or diaphoretic.  HENT:     Head: Normocephalic and atraumatic.     Nose: No congestion or rhinorrhea.     Mouth/Throat:     Mouth: Mucous membranes are moist.     Pharynx: No oropharyngeal exudate or posterior  oropharyngeal erythema.  Eyes:     Extraocular Movements: Extraocular movements intact.     Conjunctiva/sclera: Conjunctivae normal.     Pupils: Pupils are equal, round, and reactive to light.  Cardiovascular:     Rate and Rhythm: Normal rate and regular rhythm.     Heart sounds: No murmur heard. Pulmonary:     Effort: Pulmonary effort is normal. No respiratory distress.     Breath sounds: Normal breath sounds. No wheezing, rhonchi or rales.  Chest:     Chest wall: No tenderness.  Abdominal:     General: Abdomen is flat. There is no distension.     Palpations: Abdomen is soft.     Tenderness: There is no abdominal tenderness. There is no guarding or rebound.  Musculoskeletal:  General: No tenderness.     Cervical back: Neck supple. No tenderness.     Right lower leg: No edema.     Left lower leg: No edema.  Skin:    General: Skin is warm and dry.     Capillary Refill: Capillary refill takes less than 2 seconds.     Findings: No erythema or rash.  Neurological:     General: No focal deficit present.     Mental Status: He is alert. Mental status is at baseline.  Psychiatric:        Mood and Affect: Mood normal.    ED Results / Procedures / Treatments   Labs (all labs ordered are listed, but only abnormal results are displayed) Labs Reviewed  CBC WITH DIFFERENTIAL/PLATELET - Abnormal; Notable for the following components:      Result Value   Hemoglobin 11.6 (*)    HCT 36.4 (*)    MCH 25.8 (*)    RDW 17.6 (*)    All other components within normal limits  COMPREHENSIVE METABOLIC PANEL - Abnormal; Notable for the following components:   Creatinine, Ser 0.53 (*)    Albumin 2.8 (*)    All other components within normal limits  URINALYSIS, ROUTINE W REFLEX MICROSCOPIC - Abnormal; Notable for the following components:   APPearance HAZY (*)    Hgb urine dipstick SMALL (*)    Protein, ur 30 (*)    Bacteria, UA MANY (*)    All other components within normal limits   URINE CULTURE  RESP PANEL BY RT-PCR (FLU A&B, COVID) ARPGX2  CBG MONITORING, ED    EKG None  Radiology CT HEAD WO CONTRAST ( )  Result Date: 12/10/2020 CLINICAL DATA:  Mental status change, unknown cause Had a seizure, unknown if fell. Some vomiting. Rule out intracranial abnormality. EXAM: CT HEAD WITHOUT CONTRAST TECHNIQUE: Contiguous axial images were obtained from the base of the skull through the vertex without intravenous contrast. COMPARISON:  09/27/2020 FINDINGS: Brain: There is atrophy and chronic small vessel disease changes. No acute intracranial abnormality. Specifically, no hemorrhage, hydrocephalus, mass lesion, acute infarction, or significant intracranial injury. Vascular: No hyperdense vessel or unexpected calcification. Skull: No acute calvarial abnormality. Sinuses/Orbits: No acute findings Other: None IMPRESSION: Atrophy, chronic microvascular disease. No acute intracranial abnormality. Electronically Signed   By: Charlett Nose M.D.   On: 12/10/2020 19:54   DG Chest Portable 1 View  Result Date: 12/10/2020 CLINICAL DATA:  seizure at home, warm to touch, rule out occult infection EXAM: PORTABLE CHEST 1 VIEW COMPARISON:  09/02/2020 FINDINGS: Normal heart size with stable mediastinal contours. No acute airspace disease. No pleural effusion, pneumothorax, or pulmonary edema. No acute osseous abnormalities. Advanced arthropathy of both shoulders, chronic. IMPRESSION: No acute chest findings.  No evidence of pneumonia. Electronically Signed   By: Narda Rutherford M.D.   On: 12/10/2020 19:48    Procedures Procedures   Medications Ordered in ED Medications  levETIRAcetam (KEPPRA) IVPB 1000 mg/100 mL premix (has no administration in time range)    ED Course  I have reviewed the triage vital signs and the nursing notes.  Pertinent labs & imaging results that were available during my care of the patient were reviewed by me and considered in my medical decision making (see  chart for details).    MDM Rules/Calculators/A&P                           [name].  is a 86 y.o. male with a past medical history significant for prior seizures, TIA, stroke, dementia, hypertension, and gout who presents with a seizure.  According to patient, he is feeling well and does not member what happened but I called the daughter who was with him and she reports that he did not eat breakfast or lunch today and they were trying to give him some pork grinds when he started staring off and tensing up.  He stopped responding to them and started shaking slightly.  This lasted for several minutes and they called EMS.  It had resolved by the time EMS arrived and he was postictal and confused and sleepy initially.  Patient did have some vomiting but otherwise did not fall or hit his head.  She reports that he has been doing very well recently and has been taking his medications.  He has not had any recent fevers, chills, chest pain, shortness of breath, nausea, running, constipation, diarrhea, or other complaints before this episode.  On my assessment, patient is well-appearing and has no complaints.  He is resting.  On my exam, lungs are clear and chest is nontender.  Abdomen is nontender.  Moving all extremities but he does have some weakness on the left side which she reports is at his baseline.  Otherwise there is some dried vomit on his beard but otherwise he is well-appearing.  I spoke to the daughter and she suspects that him missing his breakfast and lunch may have led to this seizure.  His glucose on arrival was in the 80s but we allowed to eat and drink and recheck it.  We ended up getting a CT head which did not show any acute abnormality and chest x-ray did not show pneumonia.  We will get other basic labs to screen for significant lecture abnormalities however we loaded him with Keppra and if he passes a p.o. challenge and otherwise remains at his baseline, family feels very  comfortable with him going home to follow-up with his outpatient neurology team.  Patient agrees.  Anticipate discharge back home if labs are reassuring and he has no other complaints.  10:23 PM Urinalysis and labs are overall reassuring.  Patient is feeling better.  He received his Keppra and after speaking with the daughter we feel he is safe for discharge home.  He agrees and will be discharged back home and he can follow-up with outpatient neurology team.  He had other questions or concerns and was discharged in good condition.  Final Clinical Impression(s) / ED Diagnoses Final diagnoses:  Seizure ([place])    Clinical Impression: 1. Seizure [facility])     Disposition: Discharge  Condition: Good  I have discussed the results, Dx and Tx plan with the pt(& family if present). He/she/they expressed understanding and agree(s) with the plan. Discharge instructions discussed at great length. Strict return precautions discussed and pt &/or family have verbalized understanding of the instructions. No further questions at time of discharge.    New Prescriptions   No medications on file    Follow Up: [facility], [facility] [id number] [place] DEPT [place] [id number] [place]         Chena Chohan, Gwenyth Allegra, MD 12/10/20 2225

## 2020-12-10 NOTE — ED Notes (Signed)
[facility] called for transport.  

## 2020-12-10 NOTE — ED Notes (Signed)
[facility] at bedside for transport.  

## 2020-12-10 NOTE — ED Triage Notes (Signed)
BIBA Per EMS: Pt coming from home w/ c/o witnessed seizure like activity x4 minutes  Postictal  Hx dementia  Hx seizures  101 CBG  170/100  80 HR

## 2020-12-10 NOTE — Discharge Instructions (Signed)
Your history, exam, work-up today were overall reassuring.  Due to the seizure we loaded you with your Keppra and we check some basic blood work that was overall reassuring.  Your urine did not show evidence of infection and your chest x-ray did not show pneumonia.  The CT of your head did not show any acute abnormality and after speaking with your family we feel you are safe for discharge home.  Please make sure you are eating well and staying hydrated.  Please rest.  If any symptoms change or worsen, please return to the nearest emergency department.  Please follow-up with your neurology team and take your home medications as directed.

## 2020-12-11 NOTE — ED Notes (Signed)
Granddaughter notified that patient is en route home.

## 2020-12-13 LAB — URINE CULTURE: Culture: 10000 — AB

## 2020-12-14 ENCOUNTER — Telehealth: Payer: Self-pay | Admitting: *Deleted

## 2020-12-14 NOTE — Telephone Encounter (Signed)
Post ED Visit - Positive Culture Follow-up  Culture report reviewed by antimicrobial stewardship pharmacist: [facility] []  , Pharm.D. []  Enzo Bi, Pharm.D., BCPS AQ-ID []  , Pharm.D., BCPS []  Celedonio Miyamoto, Pharm.D., BCPS []  [place], Garvin Fila.D., BCPS, AAHIVP []  , Pharm.D., BCPS, AAHIVP []  Georgina Pillion, PharmD, BCPS []  , PharmD, BCPS []  [place], PharmD, BCPS []  [place], PharmD []  , PharmD, BCPS []  Estella Husk, PharmD  Pharmacy Team []  Lysle Pearl, PharmD []  , PharmD []  Phillips Climes, PharmD []  , Rph []  Agapito Games) , PharmD []  Verlan Friends, PharmD []  , PharmD []  Mervyn Gay, PharmD []  , PharmD []  Vinnie Level, PharmD []  [facility], PharmD []  , PharmD []  Len Childs, PharmD   Positive urine culture Asymptomatic bacterieruriaand no further patient follow-up is required at this time.  , MD  Greer Pickerel Talley 12/14/2020, 10:31 AM

## 2020-12-14 NOTE — Progress Notes (Signed)
ED Antimicrobial Stewardship Positive Culture Follow Up   Johnathan Mitchell. is an 86 y.o. male who presented to [facility] on 12/10/2020 with a chief complaint of  Chief Complaint  Patient presents with   Seizures    Recent Results (from the past 720 hour(s))  Urine Culture     Status: Abnormal   Collection Time: 12/10/20  9:25 PM   Specimen: Urine, Clean Catch  Result Value Ref Range Status   Specimen Description   Final    URINE, CLEAN CATCH Performed at [facility], [address] [place], [place] [address]    Special Requests   Final    NONE Performed at [facility], [address] [place], [place] [address]    Culture 10,000 COLONIES/mL PROTEUS MIRABILIS (A)  Final   Report Status 12/13/2020 FINAL  Final   Organism ID, Bacteria PROTEUS MIRABILIS (A)  Final      Susceptibility   Proteus mirabilis - MIC*    AMPICILLIN <=2 SENSITIVE Sensitive     CEFAZOLIN 8 SENSITIVE Sensitive     CEFEPIME <=0.12 SENSITIVE Sensitive     CEFTRIAXONE <=0.25 SENSITIVE Sensitive     CIPROFLOXACIN 2 RESISTANT Resistant     GENTAMICIN <=1 SENSITIVE Sensitive     IMIPENEM 1 SENSITIVE Sensitive     NITROFURANTOIN 128 RESISTANT Resistant     TRIMETH/SULFA <=20 SENSITIVE Sensitive     AMPICILLIN/SULBACTAM <=2 SENSITIVE Sensitive     PIP/TAZO <=4 SENSITIVE Sensitive     * 10,000 COLONIES/mL PROTEUS MIRABILIS   UA has lots of epithelial cells. Only 10k CFU of Proteus and no urinary symptoms.  Plan: No treatment necessary  ED Provider: Jacalyn Lefevre, MD  Enzo Bi, PharmD, BCPS, BCIDP Clinical Pharmacist 12/14/2020 8:30 AM

## 2021-01-14 ENCOUNTER — Emergency Department (HOSPITAL_COMMUNITY)
Admission: EM | Admit: 2021-01-14 | Discharge: 2021-01-14 | Disposition: A | Discharge status: Home / Self Care | Payer: No Typology Code available for payment source | Attending: Emergency Medicine | Admitting: Emergency Medicine

## 2021-01-14 ENCOUNTER — Emergency Department (HOSPITAL_COMMUNITY): Payer: No Typology Code available for payment source

## 2021-01-14 ENCOUNTER — Encounter (HOSPITAL_COMMUNITY): Payer: Self-pay

## 2021-01-14 DIAGNOSIS — W19XXXA Unspecified fall, initial encounter: Secondary | ICD-10-CM | POA: Insufficient documentation

## 2021-01-14 DIAGNOSIS — M79605 Pain in left leg: Secondary | ICD-10-CM | POA: Diagnosis not present

## 2021-01-14 DIAGNOSIS — Z79899 Other long term (current) drug therapy: Secondary | ICD-10-CM | POA: Insufficient documentation

## 2021-01-14 DIAGNOSIS — I10 Essential (primary) hypertension: Secondary | ICD-10-CM | POA: Diagnosis not present

## 2021-01-14 DIAGNOSIS — R1030 Lower abdominal pain, unspecified: Secondary | ICD-10-CM | POA: Diagnosis not present

## 2021-01-14 DIAGNOSIS — Z87891 Personal history of nicotine dependence: Secondary | ICD-10-CM | POA: Diagnosis not present

## 2021-01-14 DIAGNOSIS — R296 Repeated falls: Secondary | ICD-10-CM | POA: Insufficient documentation

## 2021-01-14 DIAGNOSIS — J45909 Unspecified asthma, uncomplicated: Secondary | ICD-10-CM | POA: Insufficient documentation

## 2021-01-14 DIAGNOSIS — F0390 Unspecified dementia without behavioral disturbance: Secondary | ICD-10-CM | POA: Insufficient documentation

## 2021-01-14 LAB — BASIC METABOLIC PANEL
Anion gap: 9 (ref 5–15)
BUN: 16 mg/dL (ref 8–23)
CO2: 26 mmol/L (ref 22–32)
Calcium: 9.1 mg/dL (ref 8.9–10.3)
Chloride: 102 mmol/L (ref 98–111)
Creatinine, Ser: 0.76 mg/dL (ref 0.61–1.24)
GFR, Estimated: >60 mL/min (ref >60)
Glucose, Bld: 87 mg/dL (ref 70–99)
Potassium: 4 mmol/L (ref 3.5–5.1)
Sodium: 137 mmol/L (ref 135–145)

## 2021-01-14 LAB — CBC
HCT: 44.7 % (ref 39.0–52.0)
Hemoglobin: 13.9 g/dL (ref 13.0–17.0)
MCH: 25.8 pg — ABNORMAL LOW (ref 26.0–34.0)
MCHC: 31.1 g/dL (ref 30.0–36.0)
MCV: 83.1 fL (ref 80.0–100.0)
Platelets: 243 10*3/uL (ref 150–400)
RBC: 5.38 MIL/uL (ref 4.22–5.81)
RDW: 17.2 % — ABNORMAL HIGH (ref 11.5–15.5)
WBC: 6.3 10*3/uL (ref 4.0–10.5)
nRBC: 0 % (ref 0.0–0.2)

## 2021-01-14 MED ORDER — HYDROMORPHONE HCL 1 MG/ML IJ SOLN: 1.0000 mg | Freq: Once | INTRAMUSCULAR | Status: DC

## 2021-01-14 MED ORDER — HYDROCODONE-ACETAMINOPHEN 5-325 MG PO TABS: 1.0000 | ORAL_TABLET | Freq: Four times a day (QID) | ORAL | 0 refills | Status: DC | PRN

## 2021-01-14 MED ORDER — PREDNISONE 10 MG PO TABS: 20.0000 mg | ORAL_TABLET | Freq: Two times a day (BID) | ORAL | 0 refills | Status: DC

## 2021-01-14 NOTE — ED Notes (Signed)
[facility] contacted, patient placed on transport list.  ?

## 2021-01-14 NOTE — ED Triage Notes (Signed)
Patient BIB EMS from home with complaint of recent falls and worsening left leg pain. EMS reports obvious deformity on the back of left knee. Hx dementia

## 2021-01-14 NOTE — Discharge Instructions (Signed)
Begin taking prednisone as prescribed.  Begin taking hydrocodone as prescribed as needed for pain.  Follow-up with your primary doctor and orthopedist in the near future, and return to the ER if symptoms significantly worsen or change.

## 2021-01-14 NOTE — ED Provider Notes (Signed)
[facility]-EM[facility]GENCY DEPT Provider Note   CSN: [id number] Arrival date & time: 01/14/21  0015     History Chief Complaint  Patient presents with   Leg Pain    Johnathan Mitchell. is a 86 y.o. male.  Patient is an 86 year old male sent from home for evaluation of left leg pain.  Patient has been having frequent falls over the past several months.  According to the daughter, patient is bed ridden.  He has a history of dementia and daughter gives him complete care.  I have spoken with the daughter, Johnathan Mitchell over the phone who has provided the history.  She is concerned about his left leg and the pain that it has been giving him over the past 2 weeks.  She also tells me that she noticed he was complaining of pain to the left groin when she was bathing him.  Patient himself as no additional history secondary to dementia and confusion.  The history is provided by the patient.      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   Seizures (HCC) 05/19/2017   AKI (acute kidney injury) (HCC) 05/12/2017   Stroke (HCC) 05/11/2017   Femur fracture (HCC) 05/11/2017   Chest pain 02/09/2016   HTN (hypertension) 02/09/2016   Dementia with behavioral disturbance (HCC) 02/09/2016   Chest pain at rest 02/09/2016   Cerebral infarction (HCC) 02/01/2014   Benign essential HTN 02/01/2014    Past Surgical History:  Procedure Laterality Date   BACK SURGERY     FEMUR IM NAIL Left 05/13/2017   Procedure: INTRAMEDULLARY (IM) NAIL  FEMORAL;  Surgeon: [name], MD;  Location: [facility] OR;  Service: Orthopedics;  Laterality: Left;   KNEE SURGERY         Family History  Problem Relation Age of Onset   Hyperlipidemia Mother    Hypertension Mother     Social History   Tobacco Use   Smoking status: Former   Smokeless tobacco: Never  Building services engineer Use: Never used  Substance Use Topics   Alcohol use: No   Drug use: No    Home Medications Prior to Admission medications   Medication Sig Start Date End Date Taking? Authorizing Provider  acetaminophen (TYLENOL) 500 MG tablet Take 1 tablet (500 mg total) by mouth every 8 (eight) hours. 12/20/19   Almon Hercules, MD  allopurinol (ZYLOPRIM) 100 MG tablet Take 100 mg by mouth daily. 02/17/20   [provider]  amLODipine (NORVASC) 10 MG tablet Take 1 tablet (10 mg total) by mouth daily. 12/21/19   Almon Hercules, MD  atorvastatin (LIPITOR) 80 MG tablet Take 1 tablet (80 mg total) by mouth daily at 6 PM. 08/20/17   Medina-Vargas, Monina C, NP  bacitracin ointment Apply 1 application topically 2 (two) times daily. 03/12/20   Eber Hong, MD  bisacodyl (DULCOLAX) 5 MG EC tablet Take 1 tablet (5 mg total) by mouth daily as needed for moderate constipation.  12/20/19   Almon Hercules, MD  cephALEXin (KEFLEX) 500 MG capsule Take 1 capsule (500 mg total) by mouth 4 (four) times daily. 02/04/20   Elpidio Anis, PA-C  colchicine 0.6 MG tablet Take 1 tablet (0.6 mg total) by mouth daily. 02/04/20   Elpidio Anis, PA-C  HYDROPHILIC EX Apply 1 application topically 2 (two) times daily as needed (moiturizer).    [provider]  levETIRAcetam (KEPPRA) 500 MG tablet Take 1 tablet (500 mg total) by mouth 2 (two) times daily. 08/08/20   Lorre Nick, MD  MENTHOL-METHYL SALICYLATE EX Apply 1 application topically 4 (four) times daily as needed (pain).    [provider]  Nutritional Supplements (,FEEDING SUPPLEMENT, PROSOURCE PLUS) liquid Take 30 mLs by mouth  daily. 12/21/19   Almon Hercules, MD  senna-docusate (SENOKOT-S) 8.6-50 MG tablet Take 1 tablet by mouth 2 (two) times daily. 12/20/19   Almon Hercules, MD  traMADol (ULTRAM) 50 MG tablet Take 1 tablet (50 mg total) by mouth every 6 (six) hours as needed. 03/07/20   Lorre Nick, MD    Allergies    Penicillins and Lexapro [escitalopram]  Review of Systems   Review of Systems  All other systems reviewed and are negative.  Physical Exam Updated Vital Signs BP (!) 136/108    Pulse 85    Temp (!) 97.5 F (36.4 C) (Oral)    Resp 20    Ht 5\' 6"  (1.676 m)    Wt 60.1 kg    SpO2 95%    BMI 21.39 kg/m   Physical Exam Vitals and nursing note reviewed.  Constitutional:      General: He is not in acute distress.    Appearance: He is well-developed. He is not diaphoretic.     Comments: Patient is awake and alert.  He is an elderly male with dementia.  He is disoriented and responds only minimally to questions.  HENT:     Head: Normocephalic and atraumatic.  Cardiovascular:     Rate and Rhythm: Normal rate and regular rhythm.     Heart sounds: No murmur heard.   No friction rub.  Pulmonary:     Effort: Pulmonary effort is normal. No respiratory distress.     Breath sounds: Normal breath sounds. No wheezing or rales.  Abdominal:     General: Bowel sounds are normal. There is no distension.     Palpations: Abdomen is soft.     Tenderness: There is no abdominal tenderness.  Musculoskeletal:        General: Normal range of motion.     Cervical back: Normal range of motion and neck supple.     Comments: There is a large osteophyte palpable to the posterior aspect of the knee.  He has limited range of motion secondary to this and some pain with palpation and range of motion.  DP pulses are palpable and sensation intact to the foot.  The left inguinal region is normal in appearance.  Hip with limited range of motion, however no significant discomfort with palpation or passive range of motion.   Skin:    General: Skin is warm and dry.  Neurological:     Mental Status: He is alert and oriented to person, place, and time.     Coordination: Coordination normal.    ED Results / Procedures / Treatments   Labs (all labs ordered are listed, but only abnormal results are displayed) Labs Reviewed  CBC - Abnormal; Notable for the following  components:      Result Value   MCH 25.8 (*)    RDW 17.2 (*)    All other components within normal limits  BASIC METABOLIC PANEL    EKG None  Radiology DG Knee Complete 4 Views Left  Result Date: 01/14/2021 CLINICAL DATA:  Multiple falls. EXAM: LEFT KNEE - COMPLETE 4+ VIEW COMPARISON:  None. FINDINGS: No evidence of an acute fracture, dislocation, or joint effusion. A radiopaque intramedullary rod and distal fixation screws are seen within the left femur. Marked severity marginal osteophyte formation is also seen with marked severity medial and lateral tibiofemoral compartment space narrowing. Marked severity patellofemoral narrowing is also noted. There is mild diffuse soft tissue swelling. IMPRESSION: 1. No acute fracture or dislocation. 2. Advanced tricompartmental degenerative changes. Electronically Signed   By: Aram Candela M.D.   On: 01/14/2021 01:09   DG Femur Min 2 Views Left  Result Date: 01/14/2021 CLINICAL DATA:  Recent falls. EXAM: LEFT FEMUR 2 VIEWS COMPARISON:  None. FINDINGS: There is no evidence of acute fracture or dislocation. A radiopaque intramedullary rod is seen throughout the length of the left femoral shaft. A proximal compression screw device and distal fixation screws are also noted. Areas of chronic heterotopic bone formation are seen along the medial aspect of the proximal left femur with additional marked severity chronic and degenerative changes seen along the distal left femur. Soft tissues are unremarkable. IMPRESSION: 1. No acute fracture or dislocation. 2. Extensive chronic and postoperative changes involving  the left femur. Electronically Signed   By: Aram Candela M.D.   On: 01/14/2021 01:07    Procedures Procedures   Medications Ordered in ED Medications  HYDROmorphone (DILAUDID) injection 1 mg (has no administration in time range)    ED Course  I have reviewed the triage vital signs and the nursing notes.  Pertinent labs & imaging results that were available during my care of the patient were reviewed by me and considered in my medical decision making (see chart for details).    MDM Rules/Calculators/A&P  Patient sent for evaluation of left leg pain in the setting of frequent falls and increased disorientation.  Patient with baseline dementia and offers little additional history.  History taken from daughter Johnathan Mitchell over the telephone.  Patient's exam shows no obvious abnormality.  He does have what appears to be a large osteophyte to the posterior aspect of the knee, but x-rays show no evidence for fracture or complication of the hardware that is present in his left femur.  At this point, daughter is concerned about the pain he is having in the leg.  I suspect he has significant osteoarthritis and this has possibly flared up.  Patient to be treated with prednisone and pain medication.  He is to follow-up with his orthopedist for further recommendations although I doubt the patient is much of a surgical candidate.  Final Clinical Impression(s) / ED Diagnoses Final diagnoses:  None    Rx / DC Orders ED Discharge Orders     None        Geoffery Lyons, MD 01/14/21 [phone]

## 2021-03-09 ENCOUNTER — Other Ambulatory Visit: Payer: Self-pay

## 2021-03-09 ENCOUNTER — Emergency Department (HOSPITAL_COMMUNITY)
Admission: EM | Admit: 2021-03-09 | Discharge: 2021-03-10 | Disposition: A | Discharge status: Home / Self Care | Payer: Medicare Other | Attending: Emergency Medicine | Admitting: Emergency Medicine

## 2021-03-09 ENCOUNTER — Emergency Department (HOSPITAL_COMMUNITY): Payer: Medicare Other

## 2021-03-09 DIAGNOSIS — F03A18 Unspecified dementia, mild, with other behavioral disturbance: Secondary | ICD-10-CM | POA: Insufficient documentation

## 2021-03-09 DIAGNOSIS — I10 Essential (primary) hypertension: Secondary | ICD-10-CM | POA: Insufficient documentation

## 2021-03-09 DIAGNOSIS — J45909 Unspecified asthma, uncomplicated: Secondary | ICD-10-CM | POA: Insufficient documentation

## 2021-03-09 DIAGNOSIS — R627 Adult failure to thrive: Secondary | ICD-10-CM | POA: Diagnosis present

## 2021-03-09 DIAGNOSIS — Z87891 Personal history of nicotine dependence: Secondary | ICD-10-CM | POA: Diagnosis not present

## 2021-03-09 LAB — CBC
HCT: 37 % — ABNORMAL LOW (ref 39.0–52.0)
Hemoglobin: 11.8 g/dL — ABNORMAL LOW (ref 13.0–17.0)
MCH: 26 pg (ref 26.0–34.0)
MCHC: 31.9 g/dL (ref 30.0–36.0)
MCV: 81.7 fL (ref 80.0–100.0)
Platelets: 230 10*3/uL (ref 150–400)
RBC: 4.53 MIL/uL (ref 4.22–5.81)
RDW: 16.8 % — ABNORMAL HIGH (ref 11.5–15.5)
WBC: 5.4 10*3/uL (ref 4.0–10.5)
nRBC: 0 % (ref 0.0–0.2)

## 2021-03-09 NOTE — ED Triage Notes (Signed)
Patient from home.  Patient alert at times which is baseline. Patient's family reports patient started grunting recently and this is new. EMS reports they are called every time grand daughter is at home by herself with him because she is unable to care for him by herself, and believes this is the case this time because their home health tech has been gone for 3 weeks.

## 2021-03-09 NOTE — ED Provider Notes (Signed)
[facility] Emergency Department Provider Note MRN:  [id number]  Arrival date & time: 03/10/21     Chief Complaint   Failure To Thrive   History of Present Illness   Johnathan Deptula. is a 87 y.o. year-old male with a history of stroke, dementia presenting to the ED with chief complaint of failure to thrive.  Patient coming from home, new grunting noticed by family recently.  Reportedly patient's home health tech has been unable to work for the past 3 weeks.  Patient's daughter is out of town and granddaughter is taking care of him.  Reportedly granddaughter feeling overwhelmed by the amount of care needed.  Patient largely without complaints at this time.  Review of Systems  I was unable to obtain a full/accurate HPI, PMH, or ROS due to the patient's dementia, stroke.  Patient's Health History    Past Medical History:  Diagnosis Date   Asthma    Dementia ([place])    Gout    Hypertension    Seizures ([place])    Stroke [facility])     Past Surgical History:  Procedure Laterality Date   BACK SURGERY     FEMUR IM NAIL Left 05/13/2017   Procedure: INTRAMEDULLARY (IM) NAIL FEMORAL;  Surgeon: Altamese Manitou, MD;  Location: [place];  Service: Orthopedics;  Laterality: Left;   KNEE SURGERY      Family History  Problem Relation Age of Onset   Hyperlipidemia Mother    Hypertension Mother     Social History   Socioeconomic History   Marital status: Single    Spouse name: Not on file   Number of children: Not on file   Years of education: Not on file   Highest education level: Not on file  Occupational History   Not on file  Tobacco Use   Smoking status: Former   Smokeless tobacco: Never  Vaping Use   Vaping Use: Never used  Substance and Sexual Activity   Alcohol use: No   Drug use: No   Sexual activity: Not on file  Other Topics Concern   Not on file  Social History Narrative   Not on file   Social Determinants of Health   Financial Resource Strain:  Not on file  Food Insecurity: Not on file  Transportation Needs: Not on file  Physical Activity: Not on file  Stress: Not on file  Social Connections: Not on file  Intimate Partner Violence: Not on file     Physical Exam   Vitals:   03/10/21 0200 03/10/21 0215  BP: (!) 140/97 122/90  Pulse: 84 80  Resp: 17 14  Temp:    SpO2: 99% 100%    CONSTITUTIONAL: Chronically ill-appearing, NAD NEURO/PSYCH: Alert, oriented to name only, left-sided deficits and contractures EYES:  eyes equal and reactive ENT/NECK:  no LAD, no JVD CARDIO: Regular rate, well-perfused, normal S1 and S2 PULM:  CTAB no wheezing or rhonchi GI/GU:  non-distended, non-tender MSK/SPINE:  No gross deformities, no edema SKIN:  no rash, atraumatic   *Additional and/or pertinent findings included in MDM below  Diagnostic and Interventional Summary    EKG Interpretation  Date/Time:    Ventricular Rate:    PR Interval:    QRS Duration:   QT Interval:    QTC Calculation:   R Axis:     Text Interpretation:         Labs Reviewed  CBC - Abnormal; Notable for the following components:  Result Value   Hemoglobin 11.8 (*)    HCT 37.0 (*)    RDW 16.8 (*)    All other components within normal limits  COMPREHENSIVE METABOLIC PANEL - Abnormal; Notable for the following components:   Potassium 2.9 (*)    Chloride 97 (*)    Albumin 3.0 (*)    All other components within normal limits    DG Chest [facility] 1 View  Final Result      Medications - No data to display   Procedures  /  Critical Care Procedures  ED Course and Medical Decision Making  Initial Impression and Ddx Visit mostly driven by lack of support or resources at home.  Report of new grunting which could suggest respiratory pathology however no grunting noted on my exam, sitting comfortably with normal vital signs.  Abdomen soft and nontender, seems to have baseline neurological deficits from prior stroke.  Given the lack of support at home  will obtain screening labs to look for any signs of significant dehydration.  Past medical/surgical history that increases complexity of ED encounter: Dementia, history of stroke  Interpretation of Diagnostics I personally reviewed the EKG and my interpretation is as follows: Sinus rhythm    Labs reassuring with no significant blood count or electrolyte abnormalities.  Patient Reassessment and Ultimate Disposition/Management Patient continues to look well with no complaints, normal vital signs, discussed patient's care with his daughter, who is comfortable with him returning home this evening.  X-ray with possible pneumonia, will treat with doxycycline.  Patient management required discussion with the following services or consulting groups:  None  Complexity of Problems Addressed Acute complicated illness or Injury  Additional Data Reviewed and Analyzed Further history obtained from: Further history from spouse/family member  Additional Factors Impacting ED Encounter Risk Prescriptions and Consideration of hospitalization  Barth Kirks. Sedonia Small, MD [place] [email or web address]  Final Clinical Impressions(s) / ED Diagnoses     ICD-10-CM   1. Mild dementia with other behavioral disturbance, unspecified dementia type  F03.A18       ED Discharge Orders          Ordered    doxycycline (VIBRAMYCIN) 100 MG capsule  2 times daily        03/10/21 0242             Discharge Instructions Discussed with and Provided to Patient:     Discharge Instructions      You were evaluated in the Emergency Department and after careful evaluation, we did not find any emergent condition requiring admission or further testing in the hospital.  Your exam/testing today is overall reassuring.  X-ray might have some signs of pneumonia, take the doxycycline antibiotic as directed.  Please return to the Emergency Department if you experience any  worsening of your condition.   Thank you for allowing [place] to be a part of your care.       Maudie Flakes, MD 03/10/21 [phone]

## 2021-03-10 LAB — COMPREHENSIVE METABOLIC PANEL
ALT: 12 U/L (ref 0–44)
AST: 15 U/L (ref 15–41)
Albumin: 3 g/dL — ABNORMAL LOW (ref 3.5–5.0)
Alkaline Phosphatase: 69 U/L (ref 38–126)
Anion gap: 8 (ref 5–15)
BUN: 17 mg/dL (ref 8–23)
CO2: 32 mmol/L (ref 22–32)
Calcium: 8.9 mg/dL (ref 8.9–10.3)
Chloride: 97 mmol/L — ABNORMAL LOW (ref 98–111)
Creatinine, Ser: 0.66 mg/dL (ref 0.61–1.24)
GFR, Estimated: >60 mL/min (ref >60)
Glucose, Bld: 92 mg/dL (ref 70–99)
Potassium: 2.9 mmol/L — ABNORMAL LOW (ref 3.5–5.1)
Sodium: 137 mmol/L (ref 135–145)
Total Bilirubin: 0.8 mg/dL (ref 0.3–1.2)
Total Protein: 7.3 g/dL (ref 6.5–8.1)

## 2021-03-10 MED ORDER — DOXYCYCLINE HYCLATE 100 MG PO CAPS: 100.0000 mg | ORAL_CAPSULE | Freq: Two times a day (BID) | ORAL | 0 refills | Status: DC

## 2021-03-10 NOTE — ED Notes (Signed)
Patient unable to sign MSE d/t arm pain/contractures.

## 2021-03-10 NOTE — Discharge Instructions (Signed)
You were evaluated in the Emergency Department and after careful evaluation, we did not find any emergent condition requiring admission or further testing in the hospital.  Your exam/testing today is overall reassuring.  X-ray might have some signs of pneumonia, take the doxycycline antibiotic as directed.  Please return to the Emergency Department if you experience any worsening of your condition.   Thank you for allowing [place] to be a part of your care.

## 2021-03-10 NOTE — ED Notes (Signed)
[facility] at bedside for transport.  

## 2021-03-11 ENCOUNTER — Inpatient Hospital Stay (HOSPITAL_COMMUNITY)
Admission: EM | Admit: 2021-03-11 | Discharge: 2021-03-15 | DRG: 640 | Disposition: A | Discharge status: Skilled Nursing Facility | Payer: No Typology Code available for payment source | Attending: Family Medicine | Admitting: Family Medicine

## 2021-03-11 ENCOUNTER — Other Ambulatory Visit: Payer: Self-pay

## 2021-03-11 ENCOUNTER — Encounter (HOSPITAL_COMMUNITY): Payer: Self-pay | Admitting: *Deleted

## 2021-03-11 DIAGNOSIS — M109 Gout, unspecified: Secondary | ICD-10-CM | POA: Diagnosis present

## 2021-03-11 DIAGNOSIS — E876 Hypokalemia: Secondary | ICD-10-CM | POA: Diagnosis present

## 2021-03-11 DIAGNOSIS — Z23 Encounter for immunization: Secondary | ICD-10-CM

## 2021-03-11 DIAGNOSIS — Z79899 Other long term (current) drug therapy: Secondary | ICD-10-CM

## 2021-03-11 DIAGNOSIS — I10 Essential (primary) hypertension: Secondary | ICD-10-CM | POA: Diagnosis present

## 2021-03-11 DIAGNOSIS — Z888 Allergy status to other drugs, medicaments and biological substances status: Secondary | ICD-10-CM

## 2021-03-11 DIAGNOSIS — R569 Unspecified convulsions: Secondary | ICD-10-CM

## 2021-03-11 DIAGNOSIS — R9431 Abnormal electrocardiogram [ECG] [EKG]: Secondary | ICD-10-CM | POA: Diagnosis present

## 2021-03-11 DIAGNOSIS — R531 Weakness: Secondary | ICD-10-CM

## 2021-03-11 DIAGNOSIS — I69354 Hemiplegia and hemiparesis following cerebral infarction affecting left non-dominant side: Secondary | ICD-10-CM

## 2021-03-11 DIAGNOSIS — Z880 Allergy status to penicillin: Secondary | ICD-10-CM

## 2021-03-11 DIAGNOSIS — L8990 Pressure ulcer of unspecified site, unspecified stage: Secondary | ICD-10-CM | POA: Insufficient documentation

## 2021-03-11 DIAGNOSIS — Z7952 Long term (current) use of systemic steroids: Secondary | ICD-10-CM

## 2021-03-11 DIAGNOSIS — R627 Adult failure to thrive: Secondary | ICD-10-CM | POA: Diagnosis present

## 2021-03-11 DIAGNOSIS — Z681 Body mass index (BMI) 19 or less, adult: Secondary | ICD-10-CM

## 2021-03-11 DIAGNOSIS — J470 Bronchiectasis with acute lower respiratory infection: Secondary | ICD-10-CM | POA: Diagnosis present

## 2021-03-11 DIAGNOSIS — F03918 Unspecified dementia, unspecified severity, with other behavioral disturbance: Secondary | ICD-10-CM | POA: Diagnosis present

## 2021-03-11 DIAGNOSIS — J189 Pneumonia, unspecified organism: Secondary | ICD-10-CM | POA: Diagnosis present

## 2021-03-11 DIAGNOSIS — Z87891 Personal history of nicotine dependence: Secondary | ICD-10-CM

## 2021-03-11 DIAGNOSIS — Z8249 Family history of ischemic heart disease and other diseases of the circulatory system: Secondary | ICD-10-CM

## 2021-03-11 DIAGNOSIS — L89312 Pressure ulcer of right buttock, stage 2: Secondary | ICD-10-CM | POA: Diagnosis present

## 2021-03-11 DIAGNOSIS — Z83438 Family history of other disorder of lipoprotein metabolism and other lipidemia: Secondary | ICD-10-CM

## 2021-03-11 DIAGNOSIS — Z20822 Contact with and (suspected) exposure to covid-19: Secondary | ICD-10-CM | POA: Diagnosis present

## 2021-03-11 DIAGNOSIS — E860 Dehydration: Secondary | ICD-10-CM | POA: Diagnosis present

## 2021-03-11 DIAGNOSIS — Z993 Dependence on wheelchair: Secondary | ICD-10-CM

## 2021-03-11 DIAGNOSIS — G40909 Epilepsy, unspecified, not intractable, without status epilepticus: Secondary | ICD-10-CM | POA: Diagnosis present

## 2021-03-11 NOTE — ED Provider Notes (Signed)
[facility]-EMERGENCY DEPT Provider Note   CSN: [id number] Arrival date & time: 03/11/21  2303     History  Chief Complaint  Patient presents with   Seizures    Johnathan Mitchell. is a 87 y.o. male.  The history is provided by the patient, the EMS personnel, a relative and medical records.  Seizures Johnathan Mitchell. is a 87 y.o. male who presents to the Emergency Department complaining of seizure activity.  Level V caveat due to confusion.  Pt here by EMS for evaluation of seizure like activity.  Did not take his keppra today.  Recently diagnosed with pneumonia (on Friday).  Pt reports feeling unwell with vomiting.   Daughter reports loss of appetite for the last 3-4 days, significant weight loss.  Had a seizure earlier today and vomited once.  Seizure was witnessed by his granddaughter and described as stiffening up with eye flicking and noisy breathing that lasted 2 to 3 minutes.  Daughter reports that he has had 8 seizures in the last year.      Home Medications Prior to Admission medications   Medication Sig Start Date End Date Taking? Authorizing Provider  acetaminophen (TYLENOL) 500 MG tablet Take 1 tablet (500 mg total) by mouth every 8 (eight) hours. 12/20/19   Almon Hercules, MD  allopurinol (ZYLOPRIM) 100 MG tablet Take 100 mg by mouth daily. 02/17/20   [provider]  amLODipine (NORVASC) 10 MG tablet Take 1 tablet (10 mg total) by mouth daily. 12/21/19   Almon Hercules, MD  atorvastatin (LIPITOR) 80 MG tablet Take 1 tablet (80 mg total) by mouth daily at 6 PM. 08/20/17   Medina-Vargas, Monina C, NP  bacitracin ointment Apply 1 application topically 2 (two) times daily. 03/12/20   Eber Hong, MD  bisacodyl (DULCOLAX) 5 MG EC tablet Take 1 tablet (5 mg total) by mouth daily as needed for moderate constipation. 12/20/19   Almon Hercules, MD  cephALEXin (KEFLEX) 500 MG capsule Take 1 capsule (500 mg total) by mouth 4 (four) times daily. 02/04/20    Elpidio Anis, PA-C  colchicine 0.6 MG tablet Take 1 tablet (0.6 mg total) by mouth daily. 02/04/20   Elpidio Anis, PA-C  doxycycline (VIBRAMYCIN) 100 MG capsule Take 1 capsule (100 mg total) by mouth 2 (two) times daily for 10 days. 03/10/21 03/20/21  Sabas Sous, MD  HYDROcodone-acetaminophen (NORCO) 5-325 MG tablet Take 1-2 tablets by mouth every 6 (six) hours as needed. 01/14/21   Geoffery Lyons, MD  HYDROPHILIC EX Apply 1 application topically 2 (two) times daily as needed (moiturizer).    [provider]  levETIRAcetam (KEPPRA) 500 MG tablet Take 1 tablet (500 mg total) by mouth 2 (two) times daily. 08/08/20   Lorre Nick, MD  MENTHOL-METHYL SALICYLATE EX Apply 1 application topically 4 (four) times daily as needed (pain).    [provider]  Nutritional Supplements (,FEEDING SUPPLEMENT, PROSOURCE PLUS) liquid Take 30 mLs by mouth daily. 12/21/19   Almon Hercules, MD  predniSONE (DELTASONE) 10 MG tablet Take 2 tablets (20 mg total) by mouth 2 (two) times daily with a meal. 01/14/21   Delo, Riley Lam, MD  senna-docusate (SENOKOT-S) 8.6-50 MG tablet Take 1 tablet by mouth 2 (two) times daily. 12/20/19   Almon Hercules, MD  traMADol (ULTRAM) 50 MG tablet Take 1 tablet (50 mg total) by mouth every 6 (six) hours as needed. 03/07/20   Lorre Nick, MD  Allergies    Penicillins and Lexapro [escitalopram]    Review of Systems   Review of Systems  Neurological:  Positive for seizures.  All other systems reviewed and are negative.  Physical Exam Updated Vital Signs BP 112/82 (BP Location: Right Arm)    Pulse 84    Temp 97.9 F (36.6 C) (Oral)    Resp (!) 22    SpO2 92%  Physical Exam Vitals and nursing note reviewed.  Constitutional:      Appearance: He is well-developed.  HENT:     Head: Normocephalic and atraumatic.  Cardiovascular:     Rate and Rhythm: Normal rate and regular rhythm.     Heart sounds: No murmur heard. Pulmonary:     Effort: Pulmonary effort is  normal. No respiratory distress.     Breath sounds: Normal breath sounds.  Abdominal:     Palpations: Abdomen is soft.     Tenderness: There is no guarding or rebound.     Comments: Moderate generalized abdominal tenderness  Musculoskeletal:        General: No tenderness.  Skin:    General: Skin is warm and dry.  Neurological:     Mental Status: He is alert.     Comments: Oriented to person.  Disoriented to place and time. No asymmetry of facial movement.  4/5 strength in BUE.  3/5 strength in BLE  Psychiatric:        Behavior: Behavior normal.    ED Results / Procedures / Treatments   Labs (all labs ordered are listed, but only abnormal results are displayed) Labs Reviewed  COMPREHENSIVE METABOLIC PANEL - Abnormal; Notable for the following components:      Result Value   Potassium 2.3 (*)    Glucose, Bld 118 (*)    Creatinine, Ser 0.50 (*)    Calcium 8.6 (*)    Albumin 2.7 (*)    All other components within normal limits  CBC WITH DIFFERENTIAL/PLATELET - Abnormal; Notable for the following components:   RBC 4.15 (*)    Hemoglobin 11.0 (*)    HCT 33.7 (*)    RDW 16.7 (*)    All other components within normal limits  RESP PANEL BY RT-PCR (FLU A&B, COVID) ARPGX2  MAGNESIUM  LIPASE, BLOOD  MAGNESIUM  URINALYSIS, ROUTINE W REFLEX MICROSCOPIC  CBC WITH DIFFERENTIAL/PLATELET  MAGNESIUM  COMPREHENSIVE METABOLIC PANEL    EKG EKG Interpretation  Date/Time:  Monday March 12 2021 00:17:30 EST Ventricular Rate:  89 PR Interval:  190 QRS Duration: 59 QT Interval:  448 QTC Calculation: 546 R Axis:   21 Text Interpretation: Sinus rhythm Abnormal R-wave progression, early transition Inferior infarct, age indeterminate Prolonged QT interval Interpretation limited secondary to artifact Confirmed by Tilden Fossa [phone]) on 03/12/2021 12:31:46 AM  Radiology CT Head Wo Contrast  Result Date: 03/12/2021 CLINICAL DATA:  Altered mental status. EXAM: CT HEAD WITHOUT CONTRAST  TECHNIQUE: Contiguous axial images were obtained from the base of the skull through the vertex without intravenous contrast. RADIATION DOSE REDUCTION: This exam was performed according to the departmental dose-optimization program which includes automated exposure control, adjustment of the mA and/or kV according to patient size and/or use of iterative reconstruction technique. COMPARISON:  Head CT dated 12/10/2020. FINDINGS: Brain: Moderate age-related atrophy and advanced chronic microvascular ischemic changes. There is no acute intracranial hemorrhage. No mass effect or midline shift. No extra-axial fluid collection. Vascular: No hyperdense vessel or unexpected calcification. Skull: Normal. Negative for fracture or focal lesion. Sinuses/Orbits: No  acute finding. Other: None IMPRESSION: 1. No acute intracranial pathology. 2. Moderate age-related atrophy and advanced chronic microvascular ischemic changes. Electronically Signed   By: Elgie Collard M.D.   On: 03/12/2021 01:14   CT Abdomen Pelvis W Contrast  Result Date: 03/12/2021 CLINICAL DATA:  Nausea, vomiting and abdominal pain. EXAM: CT ABDOMEN AND PELVIS WITH CONTRAST TECHNIQUE: Multidetector CT imaging of the abdomen and pelvis was performed using the standard protocol following bolus administration of intravenous contrast. RADIATION DOSE REDUCTION: This exam was performed according to the departmental dose-optimization program which includes automated exposure control, adjustment of the mA and/or kV according to patient size and/or use of iterative reconstruction technique. CONTRAST:  OMNIPAQUE IOHEXOL 300 MG/ML  SOLN COMPARISON:  Similar study 12/14/2019. FINDINGS: Lower chest: The heart is slightly enlarged. Coronary arteries are heavily calcified. There is a small pericardial effusion anteriorly not seen previous superior There is increased posterior basal left lower lobe opacity which could be increased atelectasis or consolidation. There  are chronic changes in the lower lobes with infrahilar bronchiectasis. Hepatobiliary: No focal liver abnormality is seen. No gallstones, gallbladder wall thickening, or biliary dilatation. Pancreas: Unremarkable pancreas. Spleen: Normal in size and enhancement. Adrenals/Urinary Tract: There is no adrenal mass no focal abnormality in the renal cortex and no urinary stones or obstruction. No bladder thickening is convincingly seen with the bladder wall partially obscured by left hip nailing hardware. Stomach/Bowel: A gastric wall and unopacified small bowel are unremarkable. The appendix is normal caliber. There is moderate stool retention. Colonic interpositioning increased with the hepatic flexure interposed between the liver and abdominal wall more so than previously. Additionally there is moderate circumferential thickening of the rectum with adjacent stranding not seen previously. Vascular/Lymphatic: Abdominal aorta is tortuous with moderate to heavy calcifications. 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 Reproductive: Enlarged prostate, but not well seen due to the right hip hardware. An estimated 5.2 cm transverse axis. Also , both testicles appear to have retracted into the inguinal canals compared to previous exam. Other: There is trace ascites in the presacral pelvis. No other free fluid. There is no free air, hemorrhage or abscess. There is no incarcerated hernia. Musculoskeletal: There are advanced degenerative changes in the lumbar spine. 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 There is advanced right hip DJD. IMPRESSION: 1. Increased atelectasis or  consolidation in the left lower lobe, background chronic changes with bilateral infrahilar lower lobe bronchiectasis. 2. Increased moderate circumferential rectal thickening compatible with proctitis whether infectious or inflammatory, versus infiltrating disease. Direct visualization recommended after treatment. 3. Both testicles appear retracted into the inguinal canals compared to the prior study. 4. Constipation with diverticulosis and increased colonic interposition of the hepatic flexure anterior to the liver. 5. Stable iliac aneurysms. 6. Prostatomegaly. 7. Degenerative and surgical changes at the hips. 8. Aortic and coronary artery atherosclerosis. 9. Small anterior pericardial fluid also new.  Mild cardiomegaly. Electronically Signed   By: Almira Bar M.D.   On: 03/12/2021 03:17   DG Chest Port 1 View  Result Date: 03/12/2021 CLINICAL DATA:  Seizure, confusion EXAM: PORTABLE CHEST 1 VIEW COMPARISON:  03/09/2021 FINDINGS: Heart and mediastinal contours are within normal limits.  No focal opacities or effusions. No acute bony abnormality. IMPRESSION: No active disease. Electronically Signed   By: Charlett NoseKevin  Dover M.D.   On: 03/12/2021 00:19    Procedures Procedures   CRITICAL CARE Performed by: Tilden FossaElizabeth Terilyn Sano   Total critical care time: 35 minutes  Critical care time was exclusive of separately billable procedures and treating other patients.  Critical care was necessary to treat or prevent imminent or life-threatening deterioration.  Critical care was time spent personally by me on the following activities: development of treatment plan with patient and/or surrogate as well as nursing, discussions with consultants, evaluation of patient's response to treatment, examination of patient, obtaining history from patient or surrogate, ordering and performing treatments and interventions, ordering and review of laboratory studies, ordering and review of radiographic studies, pulse oximetry and  re-evaluation of patient's condition.  Medications Ordered in ED Medications  potassium chloride 10 mEq in 100 mL IVPB (10 mEq Intravenous New Bag/Given 03/12/21 0420)  acetaminophen (TYLENOL) tablet 650 mg (has no administration in time range)    Or  acetaminophen (TYLENOL) suppository 650 mg (has no administration in time range)  sodium chloride 0.9 % bolus 500 mL (0 mLs Intravenous Stopped 03/12/21 0211)  levETIRAcetam (KEPPRA) IVPB 500 mg/100 mL premix (0 mg Intravenous Stopped 03/12/21 0348)  iohexol (OMNIPAQUE) 300 MG/ML solution 100 mL (100 mLs Intravenous Contrast Given 03/12/21 0231)    ED Course/ Medical Decision Making/ A&P                           Medical Decision Making Amount and/or Complexity of Data Reviewed Labs: ordered. Radiology: ordered.  Risk Prescription drug management. Decision regarding hospitalization.   Patient coming from home for evaluation of seizure activity.  He does have a history of dementia and was diagnosed with seizures 1 year ago and currently takes Keppra.  He was recently treated for pneumonia with oral antibiotics.  On assessment patient is confused with no focal neurologic findings, appears dehydrated and does have abdominal tenderness.  EKG is significant for prolonged QT when compared to priors.  Labs significant for hypokalemia.  Given his abdominal tenderness, CT scan was obtained.  CT is negative for obstruction or evidence of intra-abdominal abscess.  He was treated with IV potassium for his hypokalemia.  Unclear if his seizure activity was secondary to seizure or underlying arrhythmia.  He was also treated with Keppra.  Patient updated to findings of studies recommendation for admission.  Attempted to recontact daughter regarding study findings, no answer at that time.  Medicine consulted for admission for ongoing treatment.        Final Clinical Impression(s) / ED Diagnoses Final diagnoses:  Seizure-like activity (HCC)  Hypokalemia     Rx / DC Orders ED Discharge Orders     None         Tilden Fossaees, Deneisha Dade, MD 03/12/21 [phone]

## 2021-03-11 NOTE — ED Triage Notes (Signed)
Pt is alert to self, follows commands. Brief was full of urine and brown mucous type stool. Pt cleansed. Buttocks, sacral and penis is red, tender touch. Primofit placed, warm blankets given. Repositioned.

## 2021-03-11 NOTE — ED Triage Notes (Signed)
Pt arrives via [facility] from home. Seizure eye rolled to back of head and twitching. Previous stroke with left sided deficits. Has not had his keppra today. Family concerned about decreased appetite. Recent dx with PNA (Friday). Answers simple commands, hx of dementia. IV established in the right hand. VSS en route.

## 2021-03-12 ENCOUNTER — Encounter (HOSPITAL_COMMUNITY): Payer: Self-pay

## 2021-03-12 ENCOUNTER — Emergency Department (HOSPITAL_COMMUNITY): Payer: No Typology Code available for payment source

## 2021-03-12 DIAGNOSIS — L89312 Pressure ulcer of right buttock, stage 2: Secondary | ICD-10-CM | POA: Diagnosis present

## 2021-03-12 DIAGNOSIS — R569 Unspecified convulsions: Secondary | ICD-10-CM

## 2021-03-12 DIAGNOSIS — Z888 Allergy status to other drugs, medicaments and biological substances status: Secondary | ICD-10-CM | POA: Diagnosis not present

## 2021-03-12 DIAGNOSIS — E860 Dehydration: Secondary | ICD-10-CM

## 2021-03-12 DIAGNOSIS — Z880 Allergy status to penicillin: Secondary | ICD-10-CM | POA: Diagnosis not present

## 2021-03-12 DIAGNOSIS — G40909 Epilepsy, unspecified, not intractable, without status epilepticus: Secondary | ICD-10-CM | POA: Diagnosis present

## 2021-03-12 DIAGNOSIS — E876 Hypokalemia: Principal | ICD-10-CM

## 2021-03-12 DIAGNOSIS — Z681 Body mass index (BMI) 19 or less, adult: Secondary | ICD-10-CM | POA: Diagnosis not present

## 2021-03-12 DIAGNOSIS — Z79899 Other long term (current) drug therapy: Secondary | ICD-10-CM | POA: Diagnosis not present

## 2021-03-12 DIAGNOSIS — J470 Bronchiectasis with acute lower respiratory infection: Secondary | ICD-10-CM | POA: Diagnosis present

## 2021-03-12 DIAGNOSIS — Z20822 Contact with and (suspected) exposure to covid-19: Secondary | ICD-10-CM | POA: Diagnosis present

## 2021-03-12 DIAGNOSIS — Z23 Encounter for immunization: Secondary | ICD-10-CM | POA: Diagnosis not present

## 2021-03-12 DIAGNOSIS — Z993 Dependence on wheelchair: Secondary | ICD-10-CM | POA: Diagnosis not present

## 2021-03-12 DIAGNOSIS — M109 Gout, unspecified: Secondary | ICD-10-CM | POA: Diagnosis present

## 2021-03-12 DIAGNOSIS — Z83438 Family history of other disorder of lipoprotein metabolism and other lipidemia: Secondary | ICD-10-CM | POA: Diagnosis not present

## 2021-03-12 DIAGNOSIS — R627 Adult failure to thrive: Secondary | ICD-10-CM | POA: Diagnosis present

## 2021-03-12 DIAGNOSIS — R9431 Abnormal electrocardiogram [ECG] [EKG]: Secondary | ICD-10-CM | POA: Diagnosis present

## 2021-03-12 DIAGNOSIS — F03918 Unspecified dementia, unspecified severity, with other behavioral disturbance: Secondary | ICD-10-CM | POA: Diagnosis present

## 2021-03-12 DIAGNOSIS — I10 Essential (primary) hypertension: Secondary | ICD-10-CM | POA: Diagnosis present

## 2021-03-12 DIAGNOSIS — Z7952 Long term (current) use of systemic steroids: Secondary | ICD-10-CM | POA: Diagnosis not present

## 2021-03-12 DIAGNOSIS — Z87891 Personal history of nicotine dependence: Secondary | ICD-10-CM | POA: Diagnosis not present

## 2021-03-12 DIAGNOSIS — J189 Pneumonia, unspecified organism: Secondary | ICD-10-CM | POA: Diagnosis present

## 2021-03-12 DIAGNOSIS — R531 Weakness: Secondary | ICD-10-CM | POA: Diagnosis not present

## 2021-03-12 DIAGNOSIS — Z8249 Family history of ischemic heart disease and other diseases of the circulatory system: Secondary | ICD-10-CM | POA: Diagnosis not present

## 2021-03-12 DIAGNOSIS — I69354 Hemiplegia and hemiparesis following cerebral infarction affecting left non-dominant side: Secondary | ICD-10-CM | POA: Diagnosis not present

## 2021-03-12 LAB — RESP PANEL BY RT-PCR (FLU A&B, COVID) ARPGX2
Influenza A by PCR: NEGATIVE
Influenza B by PCR: NEGATIVE
SARS Coronavirus 2 by RT PCR: NEGATIVE

## 2021-03-12 LAB — CBC WITH DIFFERENTIAL/PLATELET
Abs Immature Granulocytes: 0.03 10*3/uL (ref 0.00–0.07)
Abs Immature Granulocytes: 0.03 10*3/uL (ref 0.00–0.07)
Basophils Absolute: 0 10*3/uL (ref 0.0–0.1)
Basophils Absolute: 0 10*3/uL (ref 0.0–0.1)
Basophils Relative: 1 %
Basophils Relative: 1 %
Eosinophils Absolute: 0.1 10*3/uL (ref 0.0–0.5)
Eosinophils Absolute: 0.1 10*3/uL (ref 0.0–0.5)
Eosinophils Relative: 1 %
Eosinophils Relative: 1 %
HCT: 33.7 % — ABNORMAL LOW (ref 39.0–52.0)
HCT: 32.9 % — ABNORMAL LOW (ref 39.0–52.0)
Hemoglobin: 11 g/dL — ABNORMAL LOW (ref 13.0–17.0)
Hemoglobin: 10.4 g/dL — ABNORMAL LOW (ref 13.0–17.0)
Immature Granulocytes: 0 %
Immature Granulocytes: 0 %
Lymphocytes Relative: 18 %
Lymphocytes Relative: 26 %
Lymphs Abs: 1.2 10*3/uL (ref 0.7–4.0)
Lymphs Abs: 1.9 10*3/uL (ref 0.7–4.0)
MCH: 26.5 pg (ref 26.0–34.0)
MCH: 26.3 pg (ref 26.0–34.0)
MCHC: 32.6 g/dL (ref 30.0–36.0)
MCHC: 31.6 g/dL (ref 30.0–36.0)
MCV: 81.2 fL (ref 80.0–100.0)
MCV: 83.1 fL (ref 80.0–100.0)
Monocytes Absolute: 0.8 10*3/uL (ref 0.1–1.0)
Monocytes Absolute: 0.9 10*3/uL (ref 0.1–1.0)
Monocytes Relative: 11 %
Monocytes Relative: 12 %
Neutro Abs: 5 10*3/uL (ref 1.7–7.7)
Neutro Abs: 4.5 10*3/uL (ref 1.7–7.7)
Neutrophils Relative %: 69 %
Neutrophils Relative %: 60 %
Platelets: 245 10*3/uL (ref 150–400)
Platelets: 214 10*3/uL (ref 150–400)
RBC: 4.15 MIL/uL — ABNORMAL LOW (ref 4.22–5.81)
RBC: 3.96 MIL/uL — ABNORMAL LOW (ref 4.22–5.81)
RDW: 16.7 % — ABNORMAL HIGH (ref 11.5–15.5)
RDW: 16.8 % — ABNORMAL HIGH (ref 11.5–15.5)
WBC: 7.1 10*3/uL (ref 4.0–10.5)
WBC: 7.4 10*3/uL (ref 4.0–10.5)
nRBC: 0 % (ref 0.0–0.2)
nRBC: 0 % (ref 0.0–0.2)

## 2021-03-12 LAB — COMPREHENSIVE METABOLIC PANEL
ALT: 15 U/L (ref 0–44)
ALT: 13 U/L (ref 0–44)
AST: 21 U/L (ref 15–41)
AST: 15 U/L (ref 15–41)
Albumin: 2.7 g/dL — ABNORMAL LOW (ref 3.5–5.0)
Albumin: 2.5 g/dL — ABNORMAL LOW (ref 3.5–5.0)
Alkaline Phosphatase: 68 U/L (ref 38–126)
Alkaline Phosphatase: 57 U/L (ref 38–126)
Anion gap: 10 (ref 5–15)
Anion gap: 5 (ref 5–15)
BUN: 12 mg/dL (ref 8–23)
BUN: 9 mg/dL (ref 8–23)
CO2: 29 mmol/L (ref 22–32)
CO2: 27 mmol/L (ref 22–32)
Calcium: 8.6 mg/dL — ABNORMAL LOW (ref 8.9–10.3)
Calcium: 7.9 mg/dL — ABNORMAL LOW (ref 8.9–10.3)
Chloride: 102 mmol/L (ref 98–111)
Chloride: 105 mmol/L (ref 98–111)
Creatinine, Ser: 0.5 mg/dL — ABNORMAL LOW (ref 0.61–1.24)
Creatinine, Ser: 0.66 mg/dL (ref 0.61–1.24)
GFR, Estimated: >60 mL/min (ref >60)
GFR, Estimated: >60 mL/min (ref >60)
Glucose, Bld: 118 mg/dL — ABNORMAL HIGH (ref 70–99)
Glucose, Bld: 87 mg/dL (ref 70–99)
Potassium: 2.3 mmol/L — CL (ref 3.5–5.1)
Potassium: 3 mmol/L — ABNORMAL LOW (ref 3.5–5.1)
Sodium: 141 mmol/L (ref 135–145)
Sodium: 137 mmol/L (ref 135–145)
Total Bilirubin: 0.4 mg/dL (ref 0.3–1.2)
Total Bilirubin: 0.4 mg/dL (ref 0.3–1.2)
Total Protein: 6.8 g/dL (ref 6.5–8.1)
Total Protein: 5.8 g/dL — ABNORMAL LOW (ref 6.5–8.1)

## 2021-03-12 LAB — PHOSPHORUS: Phosphorus: 1.9 mg/dL — ABNORMAL LOW (ref 2.5–4.6)

## 2021-03-12 LAB — URINALYSIS, ROUTINE W REFLEX MICROSCOPIC
Bacteria, UA: NONE SEEN
Bilirubin Urine: NEGATIVE
Glucose, UA: NEGATIVE mg/dL
Hgb urine dipstick: NEGATIVE
Ketones, ur: NEGATIVE mg/dL
Leukocytes,Ua: NEGATIVE
Nitrite: NEGATIVE
Protein, ur: NEGATIVE mg/dL
Specific Gravity, Urine: 1.045 — ABNORMAL HIGH (ref 1.005–1.030)
pH: 6 (ref 5.0–8.0)

## 2021-03-12 LAB — MAGNESIUM
Magnesium: 1.9 mg/dL (ref 1.7–2.4)
Magnesium: 2 mg/dL (ref 1.7–2.4)
Magnesium: 1.8 mg/dL (ref 1.7–2.4)

## 2021-03-12 LAB — TSH: TSH: 1.235 u[IU]/mL (ref 0.350–4.500)

## 2021-03-12 LAB — LIPASE, BLOOD: Lipase: 38 U/L (ref 11–51)

## 2021-03-12 MED ORDER — PNEUMOCOCCAL VAC POLYVALENT 25 MCG/0.5ML IJ INJ
0.5000 mL | INJECTION | INTRAMUSCULAR | Status: AC
  Administered 2021-03-15: 0.5 mL via INTRAMUSCULAR
  Filled 2021-03-12 (×2): qty 0.5

## 2021-03-12 MED ORDER — K PHOS MONO-SOD PHOS DI & MONO 155-852-130 MG PO TABS
500.0000 mg | ORAL_TABLET | Freq: Three times a day (TID) | ORAL | Status: AC
  Administered 2021-03-12: 500 mg via ORAL
  Filled 2021-03-12 (×2): qty 2

## 2021-03-12 MED ORDER — ACETAMINOPHEN 650 MG RE SUPP: 650.0000 mg | Freq: Four times a day (QID) | RECTAL | Status: DC | PRN

## 2021-03-12 MED ORDER — SODIUM CHLORIDE 0.9 % IV BOLUS
500.0000 mL | Freq: Once | INTRAVENOUS | Status: AC
  Administered 2021-03-12: 500 mL via INTRAVENOUS

## 2021-03-12 MED ORDER — POTASSIUM CHLORIDE 10 MEQ/100ML IV SOLN
10.0000 meq | INTRAVENOUS | Status: AC
  Administered 2021-03-12 (×4): 10 meq via INTRAVENOUS
  Filled 2021-03-12 (×4): qty 100

## 2021-03-12 MED ORDER — IOHEXOL 300 MG/ML  SOLN
100.0000 mL | Freq: Once | INTRAMUSCULAR | Status: AC | PRN
  Administered 2021-03-12: 100 mL via INTRAVENOUS

## 2021-03-12 MED ORDER — POTASSIUM CHLORIDE 20 MEQ PO PACK
40.0000 meq | PACK | ORAL | Status: AC
  Administered 2021-03-12 (×2): 40 meq via ORAL
  Filled 2021-03-12 (×2): qty 2

## 2021-03-12 MED ORDER — LORAZEPAM 2 MG/ML IJ SOLN
2.0000 mg | INTRAMUSCULAR | Status: DC | PRN
  Administered 2021-03-12: 2 mg via INTRAVENOUS
  Filled 2021-03-12: qty 1

## 2021-03-12 MED ORDER — SODIUM CHLORIDE 0.9 % IV SOLN
100.0000 mg | Freq: Two times a day (BID) | INTRAVENOUS | Status: DC
  Administered 2021-03-12 (×2): 100 mg via INTRAVENOUS
  Filled 2021-03-12 (×5): qty 100

## 2021-03-12 MED ORDER — LACTATED RINGERS IV SOLN: INTRAVENOUS | Status: AC

## 2021-03-12 MED ORDER — LEVETIRACETAM IN NACL 500 MG/100ML IV SOLN
500.0000 mg | Freq: Once | INTRAVENOUS | Status: AC
  Administered 2021-03-12: 500 mg via INTRAVENOUS
  Filled 2021-03-12: qty 100

## 2021-03-12 MED ORDER — LEVETIRACETAM 500 MG PO TABS
500.0000 mg | ORAL_TABLET | Freq: Two times a day (BID) | ORAL | Status: DC
  Administered 2021-03-12 (×2): 500 mg via ORAL
  Filled 2021-03-12 (×2): qty 1

## 2021-03-12 MED ORDER — ACETAMINOPHEN 325 MG PO TABS
650.0000 mg | ORAL_TABLET | Freq: Four times a day (QID) | ORAL | Status: DC | PRN
  Administered 2021-03-14 (×2): 650 mg via ORAL
  Filled 2021-03-12 (×2): qty 2

## 2021-03-12 NOTE — Progress Notes (Signed)
Johnathan Mitchell.  Code Status: FULL [name]. is a 87 y.o. male patient admitted from [facility] ED awake, alert - oriented X2 - no acute distress noted. VSS -  no c/o shortness of breath, no c/o chest pain. Cardiac tele # in place. Call light within reach, patient able to voice, and demonstrate understanding. Skin dry and flaky, stage 2 noted to right buttock. Foam dressing placed.  ?  Will cont to eval and treat per MD orders.  Melonie Florida, RN  03/12/2021

## 2021-03-12 NOTE — Evaluation (Signed)
Physical Therapy Evaluation Patient Details Name: Johnathan Mitchell. MRN: [id number] DOB: 16-Jun-1934 Today's Date: 03/12/2021  History of Present Illness  Johnathan Mitchell. is a 87 y.o. male with medical history significant for dementia, seizure disorder, essential hypertension, asthma, seizures,embolic CVA with left side residual who is admitted to [facility] on 03/11/2021 with hypokalemia with  diminished oral intake, recently treated for CAP.  Clinical Impression  PT evaluation limited. Patient reporting  pain with any movement of  limbs and attempts to sit  up. Noted L U/LE paresis and contractures.   Patient reports that his family assists him into a WC and he pushes himself with his right  hand. No family present.  Patient has been to Ed multiple times, appears patient  may benefit from higher level of care. Pt admitted with above diagnosis.  Pt currently with functional limitations due to the deficits listed below (see PT Problem List). Pt will benefit from skilled PT to increase their independence and safety with mobility to allow discharge to the venue listed below.        Recommendations for follow up therapy are one component of a multi-disciplinary discharge planning process, led by the attending physician.  Recommendations may be updated based on patient status, additional functional criteria and insurance authorization.  Follow Up Recommendations Skilled nursing-short term rehab (<3 hours/day)    Assistance Recommended at Discharge Frequent or constant Supervision/Assistance  Patient can return home with the following  Two people to help with walking and/or transfers;A lot of help with bathing/dressing/bathroom;Assistance with cooking/housework;Direct supervision/assist for medications management;Assist for transportation    Equipment Recommendations None recommended by PT  Recommendations for Other Services       Functional Status Assessment Patient has had a recent  decline in their functional status and/or demonstrates limited ability to make significant improvements in function in a reasonable and predictable amount of time     Precautions / Restrictions Precautions Precautions: Fall Precaution Comments: left side paretic, moans and groans when touuched and moved any body part, especially left hand      Mobility  Bed Mobility               General bed mobility comments: attempted to move  the legs and roll to left side, patient moaning out that the legs are in pain.Raised HOB to facilitate  attempt to sit up to no avail. Left patient in the  bed.    Transfers                   General transfer comment: NT    Ambulation/Gait                  Stairs            Wheelchair Mobility    Modified Rankin (Stroke Patients Only)       Balance       Sitting balance - Comments: leaning to the left in the bed.                                     Pertinent Vitals/Pain Pain Assessment Pain Assessment: Faces Faces Pain Scale: Hurts even more Consolability: distracted or reassured by voice/touch Pain Location: patient indcates discomfort, withdraws when touched especially left hand which is contracted in fingers flexed. also when moving legs. Pain Descriptors / Indicators: Discomfort, Moaning Pain Intervention(s): Limited activity within patient's  tolerance, Monitored during session    Home Living Family/patient expects to be discharged to:: Private residence Living Arrangements: Children Available Help at Discharge: Family;Available 24 hours/day Type of Home: House Home Access: Ramped entrance         Home Equipment: Wheelchair - manual      Prior Function Prior Level of Function : Patient poor historian/Family not available             Mobility Comments: per pt, he transfers and rolls Wc with right arm, states he is in bed a lot.       Hand Dominance   Dominant Hand: Right     Extremity/Trunk Assessment   Upper Extremity Assessment Upper Extremity Assessment: Defer to OT evaluation    Lower Extremity Assessment Lower Extremity Assessment: LLE deficits/detail;RLE deficits/detail RLE Deficits / Details: patient does move the right leg LLE Deficits / Details: ankle plantar flexed, grossly able to attempt to flex knee and hip., indicates pain.       Communication   Communication: Expressive difficulties (difficult to understand, dysarthric.)  Cognition Arousal/Alertness: Awake/alert Behavior During Therapy: Flat affect Overall Cognitive Status: Impaired/Different from baseline Area of Impairment: Orientation                 Orientation Level: Time             General Comments: states he is in hospital, perseverates on" I was brought to the hspital". Reports his family assists with transfers        General Comments      Exercises     Assessment/Plan    PT Assessment Patient needs continued PT services  PT Problem List Decreased activity tolerance;Decreased strength;Decreased mobility;Decreased safety awareness;Decreased range of motion;Decreased balance;Decreased knowledge of use of DME       PT Treatment Interventions Functional mobility training;Therapeutic activities;Therapeutic exercise;Patient/family education;Balance training    PT Goals (Current goals can be found in the Care Plan section)  Acute Rehab PT Goals Patient Stated Goal: unable PT Goal Formulation: Patient unable to participate in goal setting Time For Goal Achievement: 03/26/21 Potential to Achieve Goals: Fair    Frequency Min 2X/week     Co-evaluation               AM-PAC PT "6 Clicks" Mobility  Outcome Measure Help needed turning from your back to your side while in a flat bed without using bedrails?: Total Help needed moving from lying on your back to sitting on the side of a flat bed without using bedrails?: Total Help needed moving to and from  a bed to a chair (including a wheelchair)?: Total Help needed standing up from a chair using your arms (e.g., wheelchair or bedside chair)?: Total Help needed to walk in hospital room?: Total Help needed climbing 3-5 steps with a railing? : Total 6 Click Score: 6    End of Session   Activity Tolerance: Patient limited by pain Patient left: in bed;with call bell/phone within reach Nurse Communication: Mobility status PT Visit Diagnosis: Muscle weakness (generalized) (M62.81);Other symptoms and signs involving the nervous system (R29.898)    Time: 5093-2671 PT Time Calculation (min) (ACUTE ONLY): 33 min   Charges:   PT Evaluation $PT Eval Low Complexity: 1 Low PT Treatments $Therapeutic Activity: 8-22 mins        Blanchard Kelch PT Acute Rehabilitation Services Pager [phone] Office [phone]   Rada Hay 03/12/2021, 9:45 AM

## 2021-03-12 NOTE — ED Notes (Signed)
Pt had episode of elevated HR in 150s with a drop in O2 sats. Pt became unresponsive with a left sided gaze. Episodes lasted for 2 minutes.  VS returned to baseline, pt still altered and not responding. Provider paged

## 2021-03-12 NOTE — Consult Note (Signed)
NEUROLOGY CONSULTATION NOTE   Date of service: March 12, 2021 Patient Name: Johnathan Mitchell. MRN:  [id number] DOB:  [birth year] Reason for consult: "Seizures" Requesting Provider: Lurene Shadow, MD _ _ _   _ __   _ __ _ _  __ __   _ __   __ _  History of Present Illness  Brelyn Gorrie. is a 87 y.o. male with PMH significant for prior stroke with right-sided residual weakness is wheelchair-bound, dementia, hypertension, seizure on Keppra who is admitted to [facility] with poor PO intake.   Two days prior to admission, he was diagnosed with community acquired Pneumonia. Was discharged from the ED on Doxycycline. He had emesis at homes and had generalized weakness and poor Po intake and was therefore brought in by family again.  In the ED, he had 2 mis episode of left gaze deviation, unresponsive with HR in 150s. He was post ictal. He was transferred to [facility] for further evaluation.  Daughter reports that for the last couple days, patient has had diarrhea along with groin pain. He has had poor po fluid intake and has also not been eating fine. She misunderstood and has been giving him Keppra only once a day. Yesterday, she forgot to give it to him.  On my evaluation, patient is somnolent. He would briefly open eyes to loud voice and make very brief eye contact but is unable to provide any meaningful history. Chart review shows that he was given Ativan 2mg  around 2132. Per RN, this was given for seizure earlier in the day. He was not actively seizing when Ativan was given.  Per RN, earlier in the shift he was able to talk, voice his needs, was able to tolerate PO intake.   ROS   Unable to obtain 2/2 somnolence.  Past History   Past Medical History:  Diagnosis Date   Asthma    Dementia (HCC)    Gout    Hypertension    Seizures (HCC)    Stroke [facility])    Past Surgical History:  Procedure Laterality Date   BACK SURGERY     FEMUR IM NAIL Left 05/13/2017    Procedure: INTRAMEDULLARY (IM) NAIL FEMORAL;  Surgeon: [name], MD;  Location: [facility] OR;  Service: Orthopedics;  Laterality: Left;   KNEE SURGERY     Family History  Problem Relation Age of Onset   Hyperlipidemia Mother    Hypertension Mother    Social History   Socioeconomic History   Marital status: Single    Spouse name: Not on file   Number of children: Not on file   Years of education: Not on file   Highest education level: Not on file  Occupational History   Not on file  Tobacco Use   Smoking status: Former   Smokeless tobacco: Never  Vaping Use   Vaping Use: Never used  Substance and Sexual Activity   Alcohol use: No   Drug use: No   Sexual activity: Not on file  Other Topics Concern   Not on file  Social History Narrative   Not on file   Social Determinants of Health   Financial Resource Strain: Not on file  Food Insecurity: Not on file  Transportation Needs: Not on file  Physical Activity: Not on file  Stress: Not on file  Social Connections: Not on file   Allergies  Allergen Reactions   Penicillins Hives and Swelling   Valproate Sodium  Other reaction(s): Other (See Comments)   Lexapro [Escitalopram] Diarrhea    Medications   Medications Prior to Admission  Medication Sig Dispense Refill Last Dose   acetaminophen (TYLENOL) 500 MG tablet Take 1 tablet (500 mg total) by mouth every 8 (eight) hours. (Patient taking differently: Take 500-1,000 mg by mouth 3 (three) times daily as needed for mild pain, moderate pain, headache or fever.)   Past Week   busPIRone (BUSPAR) 5 MG tablet Take 2.5 mg by mouth 4 (four) times daily as needed (anxiety).   Past Week   levETIRAcetam (KEPPRA) 500 MG tablet Take 1 tablet (500 mg total) by mouth 2 (two) times daily. 60 tablet 0 Past Week   aspirin 325 MG tablet Take 325 mg by mouth daily. (Patient not taking: Reported on 03/12/2021)   Not Taking   atorvastatin (LIPITOR) 80 MG tablet Take 1  tablet (80 mg total) by mouth daily at 6 PM. (Patient not taking: Reported on 03/12/2021) 30 tablet 0 Not Taking   Cholecalciferol 25 MCG (1000 UT) capsule Take 1,000 Units by mouth daily. (Patient not taking: Reported on 03/12/2021)   Not Taking   diclofenac Sodium (VOLTAREN) 1 % GEL Apply 2 g topically 4 (four) times daily as needed for pain. (Patient not taking: Reported on 03/12/2021)   Not Taking   HYDROPHILIC EX Apply 1 application topically 2 (two) times daily as needed (moiturizer). (Patient not taking: Reported on 03/12/2021)   Not Taking   vitamin B-12 (CYANOCOBALAMIN) 500 MCG tablet Take 1,000 mcg by mouth daily. (Patient not taking: Reported on 03/12/2021)   Not Taking     Vitals   Vitals:   03/12/21 1300 03/12/21 1600 03/12/21 1712 03/12/21 2038  BP: 116/86 132/84 (!) 142/95 138/88  Pulse: 72 93 85 89  Resp:  19 13 15   Temp:   97.7 F (36.5 C) 98.5 F (36.9 C)  TempSrc:   Oral Oral  SpO2: 97% 96% 98% 93%     There is no height or weight on file to calculate BMI.  Physical Exam   General: Laying comfortably in bed; in no acute distress.  HENT: Normal oropharynx and mucosa. Normal external appearance of ears and nose.  Neck: Supple, no pain or tenderness  CV: No JVD. No peripheral edema.  Pulmonary: Symmetric Chest rise. Normal respiratory effort.  Abdomen: Soft to touch, non-tender.  Ext: No cyanosis, edema, or deformity  Skin: No rash. Normal palpation of skin.   Musculoskeletal: Normal digits and nails by inspection. No clubbing.   Neurologic Examination  Mental status/Cognition: Eyes closed, briefly opens eyes but shuts them tight again. Speech/language: somnolence precludes assessment of his speech. Cranial nerves:   CN II Pupils equal and reactive to light, unable to assess for VF deficits.   CN III,IV,VI EOM intact to dolls eyes   CN V Corneals intact BL   CN VII no asymmetry, no nasolabial fold flattening   CN VIII Does not turn head towards speech.   CN  IX & X Unable to assess.   CN XI Head midline   CN XII midline tongue but does not protrude to command.   Motor:  Muscle bulk: poor, tone increased Unable to do detailed strength testing but he moves all extremities spontaneously and localizes in all BL uppers and withdraws BL lower extremities.  Reflexes:  Right Left Comments  Pectoralis      Biceps (C5/6) 1 1   Brachioradialis (C5/6) 1 1    Triceps (C6/7) 1 1  Patellar (L3/4) 1 1    Achilles (S1)      Hoffman      Plantar     Jaw jerk    Coordination/Complex Motor:  - Unable to assess but his movements appear smooth and well coordinated.  Labs   CBC:  Recent Labs  Lab 03/12/21 0000 03/12/21 0700  WBC 7.1 7.4  NEUTROABS 5.0 4.5  HGB 11.0* 10.4*  HCT 33.7* 32.9*  MCV 81.2 83.1  PLT 245 [id number]    Basic Metabolic Panel:  Lab Results  Component Value Date   NA 137 03/12/2021   K 3.0 (L) 03/12/2021   CO2 27 03/12/2021   GLUCOSE 87 03/12/2021   BUN 9 03/12/2021   CREATININE 0.66 03/12/2021   CALCIUM 7.9 (L) 03/12/2021   GFRNONAA >60 03/12/2021   GFRAA >60 09/28/2019   Lipid Panel:  Lab Results  Component Value Date   LDLCALC 91 05/12/2017   HgbA1c:  Lab Results  Component Value Date   HGBA1C 5.8 (H) 05/12/2017   Urine Drug Screen:     Component Value Date/Time   LABOPIA NONE DETECTED 05/11/2017 1755   COCAINSCRNUR NONE DETECTED 05/11/2017 1755   LABBENZ NONE DETECTED 05/11/2017 1755   AMPHETMU NONE DETECTED 05/11/2017 1755   THCU NONE DETECTED 05/11/2017 1755   LABBARB NONE DETECTED 05/11/2017 1755    Alcohol Level     Component Value Date/Time   ETH <10 05/11/2017 1135    CT Head without contrast(Personally reviewed): CTH was negative for a large hypodensity concerning for a large territory infarct or hyperdensity concerning for an ICH  Impression   Yeltsin Hishmeh. is a 87 y.o. male with PMH significant for prior stroke with right-sided residual weakness is wheelchair-bound, dementia,  hypertension, seizure on Keppra who is admitted with poor PO intake and dehydration. He had a 2 mins seizure with left gaze deviation and elevated HR and gradually returned to baseline.  Daughter apparently misunderstood and has only been giving him Keppra at home once a day. Also reports that she forgot to give him Keppra on 03/11/21.  I also suspect that dehydration from poor po intake along with recent CAP, probably decreased his seizure threshold.  Recommendations  - continue Keppra 500mg  BID. - Was given PRN Ativan at night despite not having seizure. I have discontinued the Ativan order and reached out to the hospitalist team to make them aware. - Recommend Ativan 1mg  for seizure lasting more than 3 mins. ______________________________________________________________________  Plan discused with Quinn Plowman with the Overnight Hospitalist team over phone.  Thank you for the opportunity to take part in the care of this patient. If you have any further questions, please contact the neurology consultation attending.  Signed,  [place] Pager Number [phone] _ _ _   _ __   _ __ _ _  __ __   _ __   __ _

## 2021-03-12 NOTE — H&P (Signed)
History and Physical    PLEASE NOTE THAT DRAGON DICTATION SOFTWARE WAS USED IN THE CONSTRUCTION OF THIS NOTE.   Johnathan Mitchell. FXO:[id number] DOB: 1934-05-07 DOA: 03/11/2021  PCP: [facility]  Patient coming from: home   I have personally briefly reviewed patient's old medical records in [place]  Chief Complaint: Diminished oral intake  HPI: Johnathan Mitchell. is a 87 y.o. male with medical history significant for dementia, seizure disorder, essential hypertension, who is admitted to [facility] on 03/11/2021 with hypokalemia after presenting from home to [facility] ED for evaluation of diminished oral intake.  In the setting of the patient's advanced dementia, the following history is provided by the patient's daughter, with whom the patient lives, in addition to my discussions with the EDP and via chart review.  The patient had originally presented to [facility] emergency department on 03/09/2021 at which time he was diagnosed with community-acquired pneumonia and discharged to home on that day on oral doxycycline.  However, daughter conveys, that in the interval, the patient has exhibited decline in oral intake, reporting decreased appetite.  He is also experienced 1 episode of nonbloody, nonbilious emesis earlier yesterday.  Daughter also feels that the patient is exhibiting evidence of acute generalized weakness.  Denies any trauma.     ED Course:  Vital signs in the ED were notable for the following: Afebrile; initial heart rate 96, which decreased to 74 following interval administration of IV fluids.  Labs/imaging were notable for the following: CMP notable for potassium 2.3, creatinine 0.55, BUN to creatinine ratio greater than 20.  Urinalysis notable for no white blood cells and specific gravity 1.045.  EKG showed sinus rhythm with heart rate 89, QTc 546, and no evidence of acute ischemic changes.  Chest x-ray showed no evidence of acute cardiopulmonary  process.  While in the ED, the following were administered: Potassium chloride 40 mEq IV over 4 hours as well as 500 cc normal saline bolus.  Subsequently, the patient was admitted for overnight observation to med telemetry for further evaluation management of presenting hypokalemia in the setting of dehydration, generalized weakness, prolonged QTc.    Review of Systems: As per HPI otherwise 10 point review of systems negative.   Past Medical History:  Diagnosis Date   Asthma    Dementia ([place])    Gout    Hypertension    Seizures ([place])    Stroke [facility])     Past Surgical History:  Procedure Laterality Date   BACK SURGERY     FEMUR IM NAIL Left 05/13/2017   Procedure: INTRAMEDULLARY (IM) NAIL FEMORAL;  Surgeon: Altamese Carbondale, MD;  Location: [place];  Service: Orthopedics;  Laterality: Left;   KNEE SURGERY      Social History:  reports that he has quit smoking. He has never used smokeless tobacco. He reports that he does not drink alcohol and does not use drugs.   Allergies  Allergen Reactions   Penicillins Hives and Swelling   Lexapro [Escitalopram] Diarrhea    Family History  Problem Relation Age of Onset   Hyperlipidemia Mother    Hypertension Mother     Family history reviewed and not pertinent    Prior to Admission medications   Medication Sig Start Date End Date Taking? Authorizing Provider  acetaminophen (TYLENOL) 500 MG tablet Take 1 tablet (500 mg total) by mouth every 8 (eight) hours. 12/20/19   Mercy Riding, MD  allopurinol (ZYLOPRIM) 100 MG tablet Take  100 mg by mouth daily. 02/17/20   [provider]  amLODipine (NORVASC) 10 MG tablet Take 1 tablet (10 mg total) by mouth daily. 12/21/19   Mercy Riding, MD  atorvastatin (LIPITOR) 80 MG tablet Take 1 tablet (80 mg total) by mouth daily at 6 PM. 08/20/17   Medina-Vargas, Monina C, NP  bacitracin ointment Apply 1 application topically 2 (two) times daily. 03/12/20   Noemi Chapel, MD  bisacodyl  (DULCOLAX) 5 MG EC tablet Take 1 tablet (5 mg total) by mouth daily as needed for moderate constipation. 12/20/19   Mercy Riding, MD  cephALEXin (KEFLEX) 500 MG capsule Take 1 capsule (500 mg total) by mouth 4 (four) times daily. 02/04/20   Charlann Lange, PA-C  colchicine 0.6 MG tablet Take 1 tablet (0.6 mg total) by mouth daily. 02/04/20   Charlann Lange, PA-C  doxycycline (VIBRAMYCIN) 100 MG capsule Take 1 capsule (100 mg total) by mouth 2 (two) times daily for 10 days. 03/10/21 03/20/21  Maudie Flakes, MD  HYDROcodone-acetaminophen (NORCO) 5-325 MG tablet Take 1-2 tablets by mouth every 6 (six) hours as needed. 01/14/21   Veryl Speak, MD  HYDROPHILIC EX Apply 1 application topically 2 (two) times daily as needed (moiturizer).    [provider]  levETIRAcetam (KEPPRA) 500 MG tablet Take 1 tablet (500 mg total) by mouth 2 (two) times daily. 08/08/20   Lacretia Leigh, MD  MENTHOL-METHYL SALICYLATE EX Apply 1 application topically 4 (four) times daily as needed (pain).    [provider]  Nutritional Supplements (,FEEDING SUPPLEMENT, PROSOURCE PLUS) liquid Take 30 mLs by mouth daily. 12/21/19   Mercy Riding, MD  predniSONE (DELTASONE) 10 MG tablet Take 2 tablets (20 mg total) by mouth 2 (two) times daily with a meal. 01/14/21   Delo, Nathaneil Canary, MD  senna-docusate (SENOKOT-S) 8.6-50 MG tablet Take 1 tablet by mouth 2 (two) times daily. 12/20/19   Mercy Riding, MD  traMADol (ULTRAM) 50 MG tablet Take 1 tablet (50 mg total) by mouth every 6 (six) hours as needed. 03/07/20   Lacretia Leigh, MD     Objective    Physical Exam: Vitals:   03/12/21 0215 03/12/21 3833 03/12/21 0545 03/12/21 0611  BP: 116/81 112/82 (!) 119/93   Pulse: 85 84 72   Resp: 15 (!) 22 (!) 5   Temp:    98 F (36.7 C)  TempSrc:    Oral  SpO2: 93% 92% 100%     General: appears to be stated age; confused Skin: warm, dry, no rash Head:  AT/[place] Mouth:  Oral mucosa membranes appear dry, normal dentition Neck:  supple; trachea midline Heart:  RRR; did not appreciate any M/R/G Lungs: CTAB, did not appreciate any wheezes, rales, or rhonchi Abdomen: + BS; soft, ND, NT Vascular: 2+ pedal pulses b/l; 2+ radial pulses b/l Extremities: no peripheral edema, no muscle wasting Neuro: strength and sensation intact in upper and lower extremities b/l     Labs on Admission: I have personally reviewed following labs and imaging studies  CBC: Recent Labs  Lab 03/09/21 2321 03/12/21 0000  WBC 5.4 7.1  NEUTROABS  --  5.0  HGB 11.8* 11.0*  HCT 37.0* 33.7*  MCV 81.7 81.2  PLT 230 383   Basic Metabolic Panel: Recent Labs  Lab 03/09/21 2321 03/12/21 0000  NA 137 141  K 2.9* 2.3*  CL 97* 102  CO2 32 29  GLUCOSE 92 118*  BUN 17 12  CREATININE 0.66 0.50*  CALCIUM 8.9 8.6*  MG  --  2.0   1.9   GFR: CrCl cannot be calculated (Unknown ideal weight.). Liver Function Tests: Recent Labs  Lab 03/09/21 2321 03/12/21 0000  AST 15 21  ALT 12 15  ALKPHOS 69 68  BILITOT 0.8 0.4  PROT 7.3 6.8  ALBUMIN 3.0* 2.7*   Recent Labs  Lab 03/12/21 0000  LIPASE 38   No results for input(s): AMMONIA in the last 168 hours. Coagulation Profile: No results for input(s): INR, PROTIME in the last 168 hours. Cardiac Enzymes: No results for input(s): CKTOTAL, CKMB, CKMBINDEX, TROPONINI in the last 168 hours. BNP (last 3 results) No results for input(s): PROBNP in the last 8760 hours. HbA1C: No results for input(s): HGBA1C in the last 72 hours. CBG: No results for input(s): GLUCAP in the last 168 hours. Lipid Profile: No results for input(s): CHOL, HDL, LDLCALC, TRIG, CHOLHDL, LDLDIRECT in the last 72 hours. Thyroid Function Tests: No results for input(s): TSH, T4TOTAL, FREET4, T3FREE, THYROIDAB in the last 72 hours. Anemia Panel: No results for input(s): VITAMINB12, FOLATE, FERRITIN, TIBC, IRON, RETICCTPCT in the last 72 hours. Urine analysis:    Component Value Date/Time   COLORURINE YELLOW  03/12/2021 0330   APPEARANCEUR CLEAR 03/12/2021 0330   LABSPEC 1.045 (H) 03/12/2021 0330   PHURINE 6.0 03/12/2021 0330   GLUCOSEU NEGATIVE 03/12/2021 0330   HGBUR NEGATIVE 03/12/2021 0330   BILIRUBINUR NEGATIVE 03/12/2021 0330   KETONESUR NEGATIVE 03/12/2021 0330   PROTEINUR NEGATIVE 03/12/2021 0330   UROBILINOGEN 1.0 02/01/2014 1507   NITRITE NEGATIVE 03/12/2021 0330   LEUKOCYTESUR NEGATIVE 03/12/2021 0330    Radiological Exams on Admission: CT Head Wo Contrast  Result Date: 03/12/2021 CLINICAL DATA:  Altered mental status. EXAM: CT HEAD WITHOUT CONTRAST TECHNIQUE: Contiguous axial images were obtained from the base of the skull through the vertex without intravenous contrast. RADIATION DOSE REDUCTION: This exam was performed according to the departmental dose-optimization program which includes automated exposure control, adjustment of the mA and/or kV according to patient size and/or use of iterative reconstruction technique. COMPARISON:  Head CT dated 12/10/2020. FINDINGS: Brain: Moderate age-related atrophy and advanced chronic microvascular ischemic changes. There is no acute intracranial hemorrhage. No mass effect or midline shift. No extra-axial fluid collection. Vascular: No hyperdense vessel or unexpected calcification. Skull: Normal. Negative for fracture or focal lesion. Sinuses/Orbits: No acute finding. Other: None IMPRESSION: 1. No acute intracranial pathology. 2. Moderate age-related atrophy and advanced chronic microvascular ischemic changes. Electronically Signed   By: Anner Crete M.D.   On: 03/12/2021 01:14   CT Abdomen Pelvis W Contrast  Result Date: 03/12/2021 CLINICAL DATA:  Nausea, vomiting and abdominal pain. EXAM: CT ABDOMEN AND PELVIS WITH CONTRAST TECHNIQUE: Multidetector CT imaging of the abdomen and pelvis was performed using the standard protocol following bolus administration of intravenous contrast. RADIATION DOSE REDUCTION: This exam was performed according  to the departmental dose-optimization program which includes automated exposure control, adjustment of the mA and/or kV according to patient size and/or use of iterative reconstruction technique. CONTRAST:  167m OMNIPAQUE IOHEXOL 300 MG/ML  SOLN COMPARISON:  Similar study 12/14/2019. FINDINGS: Lower chest: The heart is slightly enlarged. Coronary arteries are heavily calcified. There is a small pericardial effusion anteriorly not seen previous superior There is increased posterior basal left lower lobe opacity which could be increased atelectasis or consolidation. There are chronic changes in the lower lobes with infrahilar bronchiectasis. Hepatobiliary: No focal liver abnormality is seen. No gallstones, gallbladder wall thickening, or biliary  dilatation. Pancreas: Unremarkable pancreas. Spleen: Normal in size and enhancement. Adrenals/Urinary Tract: There is no adrenal mass no focal abnormality in the renal cortex and no urinary stones or obstruction. No bladder thickening is convincingly seen with the bladder wall partially obscured by left hip nailing hardware. Stomach/Bowel: A gastric wall and unopacified small bowel are unremarkable. The appendix is normal caliber. There is moderate stool retention. Colonic interpositioning increased with the hepatic flexure interposed between the liver and abdominal wall more so than previously. Additionally there is moderate circumferential thickening of the rectum with adjacent stranding not seen previously. Vascular/Lymphatic: Abdominal aorta is tortuous with moderate to heavy calcifications. 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 Reproductive: Enlarged prostate, but not well seen due to the right hip hardware. An estimated 5.2 cm transverse axis. Also , both  testicles appear to have retracted into the inguinal canals compared to previous exam. Other: There is trace ascites in the presacral pelvis. No other free fluid. There is no free air, hemorrhage or abscess. There is no incarcerated hernia. Musculoskeletal: There are advanced degenerative changes in the lumbar spine. 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 There is advanced right hip DJD. IMPRESSION: 1. Increased atelectasis or consolidation in the left lower lobe, background chronic changes with bilateral infrahilar lower lobe bronchiectasis. 2. Increased moderate circumferential rectal thickening compatible with proctitis whether infectious or inflammatory, versus infiltrating disease. Direct visualization recommended after treatment. 3. Both testicles appear retracted into the inguinal canals compared to the prior study. 4. Constipation with diverticulosis and increased colonic interposition of the hepatic flexure anterior to the liver. 5. Stable iliac aneurysms. 6. Prostatomegaly. 7. Degenerative and surgical changes at the hips. 8. Aortic and coronary artery atherosclerosis. 9. Small anterior pericardial fluid also new.  Mild cardiomegaly. Electronically Signed   By: Telford Nab M.D.   On: 03/12/2021 03:17   DG Chest Port 1 View  Result Date: 03/12/2021 CLINICAL DATA:  Seizure, confusion EXAM: PORTABLE CHEST 1 VIEW COMPARISON:  03/09/2021 FINDINGS: Heart and mediastinal contours are within normal limits. No focal opacities or effusions. No acute bony abnormality. IMPRESSION: No active disease. Electronically Signed   By: Rolm Baptise M.D.   On: 03/12/2021 00:19     EKG: Independently reviewed, with result as described above.    Assessment/Plan    Principal Problem:   Hypokalemia Active Problems:   HTN (hypertension)   Seizures (HCC)   Dehydration    Prolonged QT interval   Generalized weakness     #) Hypokalemia: Presenting serum potassium level 2.3.,  Likely with contributions from recent decline in oral intake as well as contribution from episode of GI losses in the form of nonbloody, nonbilious emesis episode yesterday.  He is currently in the process of receiving 40 mEq of IV potassium chloride.  Will repeat serum potassium level in the morning following completion of receipt of these 40 mEq of IV potassium chloride, with prn additional supplementation (updated result.  Plan: Repeat BMP in the morning.  Add on serum magnesium level.  Monitor on telemetry.       #) Prolonged QTc: Presenting EKG reflects QTc of 546.  No overt outpatient medications associated with QTc  prolongation.  Plan: Add Unser magnesium level.  Monitor on symmetry.  I ordered repeat EKG for the morning to monitor interval trend in QTc interval.       #) Dehydration: Clinical and physical exam evidence to suggest dehydration in the form of dry oral mucous membranes as well as the presence of acute prerenal azotemia in addition to urinalysis showing elevated specific gravity.  This is in the setting of recent decline in oral intake, as above.  Plan: Gentle lactated Ringer's overnight.  Monitor strict I's and O's and daily weights.  Repeat BMP in the morning.       #) Recent diagnosis of [employer] pneumonia: Diagnosed with CAP on 03/09/2021 and discharged home from the ED on that day with doxycycline.  Selection of antibiotics is complicated by reported allergy to penicillins in which the patient has developed hives/swelling, and further complicated by QTc prolongation, limiting the potential alternate to cephalosporins of Levaquin.  Consequently, we will continue outpatient doxycycline for now.  Of note, today's chest x-ray shows no evidence of acute cardiopulmonary process, and criteria for sepsis not currently met.  Plan: Continue outpatient  doxycycline, as above.  Repeat CBC in the morning.      #) Generalized weakness: Daughter reports her perception that the patient has exhibited evidence of generalized weakness over the last few days.  Potentially multifactorial in etiology, with suspected contributions from dehydration, as well as potential physiologic stressor from underlying CAP  Plan: IV fluids, as above further evaluation management of recent diagnosis of community-acquired pneumonia, as above.  Repeat CMP and CBC in the morning.  Add on TSH and MMA.  Fall precautions ordered.  Physical therapy and Occupational Therapy consults have been placed for the morning.      #) Seizure disorder: Documented history of such, on Keppra as an outpatient.  Plan: Continue home [place].       #) Essential hypertension Norvasc as an outpatient.  In the setting of normotensive presenting blood pressures and potential underlying infectious presentation given report of recently diagnosed acute upper pneumonia, will hold home Norvasc for now.  Close monitoring of ensuing blood pressure via routine vital signs.  Hold home Norvasc for now, as above.     DVT prophylaxis: SCD's   Code Status: Full code Family Communication: Case discussed with the patient's daughter, with whom the patient lives Disposition Plan: Per Rounding Team Consults called: none;  Admission status: Observation; med telemetry   PLEASE NOTE THAT DRAGON DICTATION SOFTWARE WAS USED IN THE CONSTRUCTION OF THIS NOTE.   Bell DO [facility] [place]   03/12/2021, 6:38 AM

## 2021-03-12 NOTE — Progress Notes (Addendum)
Patient seen and examined at the bedside.  He is confused and unable to provide any history because of dementia.  I went back to see  him for the second time because of concern that he may have had a seizure in the ED.  Adela Lank, RN, said patient had a fixed gaze and seemed to be unaware of his surroundings.  However, there was no reported tonic-clonic activity.  Reportedly, he was drowsy/less responsive after this seizure-like activity.  By the time I arrived, patient was responsive, alert but confused.  Because of concern for recurrent seizures, Dr. Viviann Spare (neurologist), was consulted.  He recommended that patient be transferred to [facility] for further evaluation. Plan discussed with Marijean Niemann, daughter.

## 2021-03-12 NOTE — ED Notes (Signed)
Pt resting in bed. Complaining of pain when moved or touched. Does not complain of pain when laying still.  NAD.

## 2021-03-13 DIAGNOSIS — L8990 Pressure ulcer of unspecified site, unspecified stage: Secondary | ICD-10-CM | POA: Insufficient documentation

## 2021-03-13 DIAGNOSIS — J189 Pneumonia, unspecified organism: Secondary | ICD-10-CM

## 2021-03-13 LAB — BASIC METABOLIC PANEL
Anion gap: 7 (ref 5–15)
BUN: <5 mg/dL — ABNORMAL LOW (ref 8–23)
CO2: 29 mmol/L (ref 22–32)
Calcium: 8.4 mg/dL — ABNORMAL LOW (ref 8.9–10.3)
Chloride: 103 mmol/L (ref 98–111)
Creatinine, Ser: 0.62 mg/dL (ref 0.61–1.24)
GFR, Estimated: >60 mL/min (ref >60)
Glucose, Bld: 88 mg/dL (ref 70–99)
Potassium: 2.5 mmol/L — CL (ref 3.5–5.1)
Sodium: 139 mmol/L (ref 135–145)

## 2021-03-13 LAB — MAGNESIUM: Magnesium: 1.7 mg/dL (ref 1.7–2.4)

## 2021-03-13 LAB — POTASSIUM: Potassium: 2.5 mmol/L — CL (ref 3.5–5.1)

## 2021-03-13 LAB — PHOSPHORUS: Phosphorus: 2.7 mg/dL (ref 2.5–4.6)

## 2021-03-13 MED ORDER — DOXYCYCLINE HYCLATE 100 MG PO TABS
100.0000 mg | ORAL_TABLET | Freq: Two times a day (BID) | ORAL | Status: DC
  Administered 2021-03-13 – 2021-03-15 (×4): 100 mg via ORAL
  Filled 2021-03-13 (×5): qty 1

## 2021-03-13 MED ORDER — POTASSIUM CHLORIDE 10 MEQ/100ML IV SOLN
10.0000 meq | INTRAVENOUS | Status: AC
  Administered 2021-03-13 (×4): 10 meq via INTRAVENOUS
  Filled 2021-03-13 (×2): qty 100

## 2021-03-13 MED ORDER — SODIUM CHLORIDE 0.9 % IV SOLN: INTRAVENOUS | Status: DC

## 2021-03-13 MED ORDER — LEVETIRACETAM 500 MG PO TABS
500.0000 mg | ORAL_TABLET | Freq: Two times a day (BID) | ORAL | Status: DC
  Administered 2021-03-14 – 2021-03-15 (×3): 500 mg via ORAL
  Filled 2021-03-13 (×5): qty 1

## 2021-03-13 MED ORDER — LEVETIRACETAM IN NACL 500 MG/100ML IV SOLN
500.0000 mg | Freq: Two times a day (BID) | INTRAVENOUS | Status: DC
  Administered 2021-03-13 (×2): 500 mg via INTRAVENOUS
  Filled 2021-03-13 (×6): qty 100

## 2021-03-13 MED ORDER — MAGNESIUM SULFATE 2 GM/50ML IV SOLN
2.0000 g | Freq: Once | INTRAVENOUS | Status: AC
  Administered 2021-03-13: 2 g via INTRAVENOUS
  Filled 2021-03-13: qty 50

## 2021-03-13 MED ORDER — POTASSIUM CHLORIDE 20 MEQ PO PACK
40.0000 meq | PACK | Freq: Once | ORAL | Status: AC
  Administered 2021-03-13: 40 meq via ORAL
  Filled 2021-03-13: qty 2

## 2021-03-13 MED ORDER — POTASSIUM PHOSPHATES 15 MMOLE/5ML IV SOLN
30.0000 mmol | Freq: Once | INTRAVENOUS | Status: AC
  Administered 2021-03-13: 30 mmol via INTRAVENOUS
  Filled 2021-03-13 (×2): qty 10

## 2021-03-13 NOTE — Assessment & Plan Note (Signed)
-  Diagnosed in 2/10 based on left lung opacity chest x-ray  -Complete the course of doxycycline.

## 2021-03-13 NOTE — Assessment & Plan Note (Signed)
-  EKG on admission showed QTc of 546.   -Cardiac electrolyte levels.  Monitor EKG

## 2021-03-13 NOTE — Plan of Care (Signed)

## 2021-03-13 NOTE — Assessment & Plan Note (Signed)
-  Currently Norvasc on hold.  Blood pressure controlled

## 2021-03-13 NOTE — NC FL2 (Signed)
[place] MEDICAID FL2 LEVEL OF CARE SCREENING TOOL     IDENTIFICATION  Patient Name: Johnathan Mitchell. Birthdate: 10-31-1934 Sex: male Admission Date (Current Location): 03/11/2021  [facility] and [place] Number:  Producer, television/film/video and Address:  [facility], [address] [place], [place] [address]      Provider Number: [id number]  Attending Physician Name and Address:  Lorin Glass, MD  Relative Name and Phone Number:  Deanna Artis, [phone]    Current Level of Care: Hospital Recommended Level of Care: Skilled Nursing Facility Prior Approval Number:    Date Approved/Denied:   PASRR Number: [id number]  Discharge Plan: SNF    Current Diagnoses: Patient Active Problem List   Diagnosis Date Noted   Pressure injury of skin 03/13/2021   Hypokalemia 03/12/2021   Dehydration 03/12/2021   Prolonged QT interval 03/12/2021   Generalized weakness 03/12/2021   Seizure (HCC) 03/12/2021   Cellulitis of left hand 12/15/2019   Wrist pain 12/15/2019   Closed left acetabular fracture (HCC) 12/14/2019   SIRS (systemic inflammatory response syndrome) (HCC) 12/14/2019   Protein-calorie malnutrition (HCC) 05/20/2017   TIA (transient ischemic attack) 05/20/2017   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   Seizures (HCC) 05/19/2017   AKI (acute kidney injury) (HCC) 05/12/2017   Stroke (HCC) 05/11/2017   Femur fracture (HCC) 05/11/2017   Chest pain 02/09/2016   HTN (hypertension) 02/09/2016   Dementia with behavioral disturbance (HCC) 02/09/2016   Chest pain at rest 02/09/2016   Cerebral infarction (HCC) 02/01/2014   Benign essential HTN 02/01/2014    Orientation RESPIRATION BLADDER Height & Weight     Self  Normal Incontinent, External catheter Weight: 128 lb 8.5 oz (58.3 kg) Height:     BEHAVIORAL SYMPTOMS/MOOD NEUROLOGICAL BOWEL NUTRITION STATUS      Incontinent Diet (See DC summary)  AMBULATORY STATUS COMMUNICATION OF NEEDS Skin    Extensive Assist Verbally Skin abrasions (Bilateral sacrum abrasion)                       Personal Care Assistance Level of Assistance  Bathing, Feeding, Dressing Bathing Assistance: Maximum assistance Feeding assistance: Maximum assistance Dressing Assistance: Maximum assistance     Functional Limitations Info  Sight, Hearing, Speech Sight Info: Adequate Hearing Info: Adequate Speech Info: Impaired    SPECIAL CARE FACTORS FREQUENCY  PT (By licensed PT), OT (By licensed OT)     PT Frequency: 5x week OT Frequency: 5x week            Contractures Contractures Info: Not present    Additional Factors Info  Code Status, Allergies Code Status Info: Full Allergies Info: Penicillins   Valproate Sodium   Lexapro (Escitalopram           Current Medications (03/13/2021):  This is the current hospital active medication list Current Facility-Administered Medications  Medication Dose Route Frequency Provider Last Rate Last Admin   acetaminophen (TYLENOL) tablet 650 mg  650 mg Oral Q6H PRN Howerter, Justin B, DO       Or   acetaminophen (TYLENOL) suppository 650 mg  650 mg Rectal Q6H PRN Howerter, Justin B, DO       doxycycline (VIBRA-TABS) tablet 100 mg  100 mg Oral Q12H Dahal, Binaya, MD       levETIRAcetam (KEPPRA) IVPB 500 mg/100 mL premix  500 mg Intravenous BID Erick Blinks, MD 400 mL/hr at 03/13/21 1020 500 mg at 03/13/21 1020   Or  levETIRAcetam (KEPPRA) tablet 500 mg  500 mg Oral BID Erick Blinks, MD       phosphorus (K PHOS NEUTRAL) tablet 500 mg  500 mg Oral TID Lurene Shadow, MD   500 mg at 03/12/21 2131   pneumococcal 23 valent vaccine (PNEUMOVAX-23) injection 0.5 mL  0.5 mL Intramuscular Tomorrow-1000 Burnadette Pop, MD         Discharge Medications: Please see discharge summary for a list of discharge medications.  Relevant Imaging Results:  Relevant Lab Results:   Additional Information SS# [id number]  Carley Hammed,  [place]

## 2021-03-13 NOTE — Assessment & Plan Note (Signed)
-  Twitching and eye rolling noted at home and in the ED -Since his previous stroke, patient was on Keppra.  Per history, family was mistakenly given Keppra only once a day.   -Neurology consult appreciated.  Keppra 500 mg twice daily resumed. -IV Ativan milligram as needed to be given for seizure-like activity lasting more than 3 minutes.

## 2021-03-13 NOTE — Progress Notes (Signed)
PROGRESS NOTE  Johnathan Mitchell.  DOB: 02/07/1934  PCP: [facility] VQX:[id number]  DOA: 03/11/2021  LOS: 1 day  Hospital Day: 3  Brief narrative: Johnathan Macgowan. is a 87 y.o. male with PMH significant for dementia, stroke with left-sided deficits, seizure, HTN, gout, asthma. At baseline, patient is alert and is taken care of by home health aide.  Per ED documentation from 2/10, whenever the granddaughter is at home by herself, she calls EMS because she is unable to take care of him.  Apparently patient's home health take is off now for 3 weeks.  EMS had to bring him to [facility] ED on 2/10 for the same reason.  Chest x-ray on 2/10 showed patchy opacity in left lung base.  Patient was started on oral doxycycline and discharged home. EMS was called again on 2/12 after family noted him having twitching of his hands and eyes rolling to the back of the head.  Family was also concerned about his decreased appetite and poor overall performance.  Patient was brought to the ED again.  In the ED, patient was afebrile, hemodynamically stable. Labs with potassium low at 2.3 EKG with sinus rhythm at 89, QTc prolonged at 546 Chest x-ray did not show any acute cardiopulmonary process Urinalysis with clear yellow urine, no leukocytes or bacteria Patient was given potassium replacement, IV fluid. While in the ED, patient had another seizure-like activity.  Neurology was consulted. Patient was admitted and transferred to hospitalist service at [facility].  Subjective: Patient was seen and examined this morning.  Elderly African-American male.  Opens eyes on verbal command.  Only mumbles and unable to take a meaningful sentence.  Family not at bedside today. Chart reviewed.  Remains hemodynamically stable, on room air Repeat labs from this morning with potassium low at 3, phosphorus low at 1.9, hemoglobin low at 10.4  Principal Problem:   Hypokalemia Active Problems:   Hypophosphatemia    Seizure (HCC)   CAP (community acquired pneumonia)   HTN (hypertension)   Dementia with behavioral disturbance   Prolonged QT interval   Dehydration   Generalized weakness   Pressure injury of skin     Assessment and Plan: * Hypokalemia- (present on admission) Hypophosphatemia -Potassium level running low consistently despite replacement. -2.5 this morning.  Oral and IV repletion ordered.  Recheck potassium this afternoon. -Phosphorus level low at 1.9.  IV potassium phosphate ordered Recent Labs  Lab 03/09/21 2321 03/12/21 0000 03/12/21 0700 03/13/21 0950  K 2.9* 2.3* 3.0* 2.5*  MG  --  2.0   1.9 1.8 1.7  PHOS  --   --  1.9* 2.7     Seizure (HCC) -Twitching and eye rolling noted at home and in the ED -Since his previous stroke, patient was on Keppra.  Per history, family was mistakenly given Keppra only once a day.   -Neurology consult appreciated.  Keppra 500 mg twice daily resumed. -IV Ativan milligram as needed to be given for seizure-like activity lasting more than 3 minutes.  CAP (community acquired pneumonia) -Diagnosed in 2/10 based on left lung opacity chest x-ray  -Complete the course of doxycycline.  HTN (hypertension)- (present on admission) -Currently Norvasc on hold.  Blood pressure controlled  Dementia with behavioral disturbance- (present on admission) -Supportive care  Prolonged QT interval- (present on admission) -EKG on admission showed QTc of 546.   -Cardiac electrolyte levels.  Monitor EKG   Dehydration- (present on admission) -Poor oral intake related dementia.   -Continue  IV hydration   Generalized weakness Failure to thrive  -Related to dementia, pneumonia, dehydration. -PT eval obtained.  SNF recommended -May need placement.    Mobility: SNF per PT Goals of care   Code Status: Full Code   Nutritional status:  Body mass index is 20.74 kg/m.      Diet:  Diet Order             Diet regular Room service appropriate? Yes;  Fluid consistency: Thin  Diet effective now                   DVT prophylaxis:  SCDs Start: 03/12/21 0409   Antimicrobials: Doxycycline Fluid: NS at 75 mill per hour Consultants: Neurology Family Communication: None at bedside  Status is: Inpatient  Continue in-hospital care because: Needs IV hydration, monitoring, placement Level of care: Progressive   Dispo: The patient is from: Home              Anticipated d/c is to: SNF              Patient currently is not medically stable to d/c.   Difficult to place patient No     Infusions:   sodium chloride     levETIRAcetam 500 mg (03/13/21 1020)   magnesium sulfate bolus IVPB     potassium chloride 10 mEq (03/13/21 1211)   potassium PHOSPHATE IVPB (in mmol)      Scheduled Meds:  doxycycline  100 mg Oral Q12H   levETIRAcetam  500 mg Oral BID   phosphorus  500 mg Oral TID   pneumococcal 23 valent vaccine  0.5 mL Intramuscular Tomorrow-1000    PRN meds: acetaminophen **OR** acetaminophen   Antimicrobials: Anti-infectives (From admission, onward)    Start     Dose/Rate Route Frequency Ordered Stop   03/13/21 1015  doxycycline (VIBRA-TABS) tablet 100 mg        100 mg Oral Every 12 hours 03/13/21 0929     03/12/21 0800  doxycycline (VIBRAMYCIN) 100 mg in sodium chloride 0.9 % 250 mL IVPB  Status:  Discontinued        100 mg 125 mL/hr over 120 Minutes Intravenous Every 12 hours 03/12/21 0659 03/13/21 0928       Objective: Vitals:   03/13/21 1150 03/13/21 1213  BP: 104/78   Pulse: 66   Resp: 13 16  Temp: 98.3 F (36.8 C)   SpO2: 99%     Intake/Output Summary (Last 24 hours) at 03/13/2021 1320 Last data filed at 03/13/2021 0500 Gross per 24 hour  Intake 100 ml  Output 550 ml  Net -450 ml   Filed Weights   03/13/21 0455  Weight: 58.3 kg   Weight change:  Body mass index is 20.74 kg/m.   Physical Exam: General exam: Pleasant, elderly African-American male.  Not in physical distress Skin: No  rashes, lesions or ulcers. HEENT: Atraumatic, normocephalic, no obvious bleeding Lungs: Clear to auscultation bilaterally CVS: Regular rate and rhythm, no murmur GI/Abd soft, nontender, nondistended, bowel sound positive. CNS: Opens eyes on verbal command, mumbles, unable to carry conversation to me Psychiatry: Sad affect Extremities: No pedal edema, no calf tenderness  Data Review: I have personally reviewed the laboratory data and studies available.  F/u labs ordered Unresulted Labs (From admission, onward)     Start     Ordered   03/14/21 0500  Magnesium  Tomorrow morning,   R       Question:  Specimen collection method  Answer:  Lab=Lab collect   03/13/21 0936   03/14/21 0500  Potassium  Tomorrow morning,   R       Question:  Specimen collection method  Answer:  Lab=Lab collect   03/13/21 4665   03/14/21 0500  Phosphorus  Tomorrow morning,   R       Question:  Specimen collection method  Answer:  Lab=Lab collect   03/13/21 9935   03/14/21 0500  Basic metabolic panel  Daily,   R     Question:  Specimen collection method  Answer:  Lab=Lab collect   03/13/21 1318   03/14/21 0500  CBC with Differential/Platelet  Daily,   R     Question:  Specimen collection method  Answer:  Lab=Lab collect   03/13/21 1318   03/13/21 1600  Basic metabolic panel  Once-Timed,   TIMED       Question:  Specimen collection method  Answer:  Lab=Lab collect   03/13/21 1318   03/12/21 0810  Methylmalonic acid, serum  Once,   R        03/12/21 0810            Signed, Lorin Glass, MD [facility] 03/13/2021

## 2021-03-13 NOTE — TOC Initial Note (Signed)
Transition of Care (TOC) - Initial/Assessment Note    Patient Details  Name: Johnathan Mitchell. MRN: [id number] Date of Birth: 1934-11-29  Transition of Care [facility]) CM/SW Contact:    Carley Hammed, LCSWA Phone Number: 03/13/2021, 10:34 AM  Clinical Narrative:                 CSW noted that pt is disoriented at this time and spoke with pt's daughter on the phone. Daughter notes that pt is from home with family, but they are struggling to care for him at this time. She asked if it were possible to have memory care and rehab as she worries he will not get the care he needs on a traditional rehab unit. She states he has been to several facilities, but she doesn't feel that he has gotten the care he needs. She would like a facility in [address] or [place]. Pt has had one covid vaccine. CSW dicussed post rehab plans with dtr. She noted they are looking into [facility] ALF placement and will be looking for a facility and talking to DSS about options for LTC. TOC will continue to follow for DC needs.  Expected Discharge Plan: Skilled Nursing Facility Barriers to Discharge: Continued Medical Work up, SNF Pending bed offer   Patient Goals and CMS Choice Patient states their goals for this hospitalization and ongoing recovery are:: Pt unable to participate in goal setting at this time. CMS Medicare.gov Compare Post Acute Care list provided to:: Patient Represenative (must comment) Choice offered to / list presented to : Adult Children  Expected Discharge Plan and Services Expected Discharge Plan: Skilled Nursing Facility     Post Acute Care Choice: Skilled Nursing Facility Living arrangements for the past 2 months: Single Family Home                                      Prior Living Arrangements/Services Living arrangements for the past 2 months: Single Family Home Lives with:: Adult Children Patient language and need for interpreter reviewed:: Yes Do you feel safe going back to the  place where you live?: Yes      Need for Family Participation in Patient Care: Yes (Comment) Care giver support system in place?: Yes (comment)   Criminal Activity/Legal Involvement Pertinent to Current Situation/Hospitalization: No - Comment as needed  Activities of Daily Living Home Assistive Devices/Equipment: Walker (specify type) ADL Screening (condition at time of admission) Patient's cognitive ability adequate to safely complete daily activities?: No Is the patient deaf or have difficulty hearing?: Yes Does the patient have difficulty seeing, even when wearing glasses/contacts?: Yes Does the patient have difficulty concentrating, remembering, or making decisions?: Yes Patient able to express need for assistance with ADLs?: Yes Does the patient have difficulty dressing or bathing?: Yes Independently performs ADLs?: No Communication: Needs assistance Is this a change from baseline?: Change from baseline, expected to last <3 days Dressing (OT): Needs assistance Is this a change from baseline?: Change from baseline, expected to last <3days Grooming: Needs assistance Is this a change from baseline?: Change from baseline, expected to last >3 days Feeding: Needs assistance Is this a change from baseline?: Change from baseline, expected to last >3 days Bathing: Dependent Is this a change from baseline?: Change from baseline, expected to last >3 days Toileting: Needs assistance Is this a change from baseline?: Change from baseline, expected to last >3days In/Out Bed:  Dependent Is this a change from baseline?: Change from baseline, expected to last <3 days Walks in Home: Needs assistance Is this a change from baseline?: Change from baseline, expected to last >3 days Does the patient have difficulty walking or climbing stairs?: Yes Weakness of Legs: Both Weakness of Arms/Hands: Both (Left arm and hand contracted since age 54 when he "broke" it)  Permission Sought/Granted Permission  sought to share information with : Family Supports Permission granted to share information with : Yes, Verbal Permission Granted  Share Information with NAME: Deanna Artis     Permission granted to share info w Relationship: Daughter  Permission granted to share info w Contact Information: [phone]  Emotional Assessment Appearance:: Appears stated age Attitude/Demeanor/Rapport: Unable to Assess Affect (typically observed): Unable to Assess Orientation: : Oriented to Self Alcohol / Substance Use: Not Applicable Psych Involvement: No (comment)  Admission diagnosis:  Hypokalemia [E87.6] Seizure (HCC) [R56.9] Seizure-like activity (HCC) [R56.9] Patient Active Problem List   Diagnosis Date Noted   Pressure injury of skin 03/13/2021   Hypokalemia 03/12/2021   Dehydration 03/12/2021   Prolonged QT interval 03/12/2021   Generalized weakness 03/12/2021   Seizure (HCC) 03/12/2021   Cellulitis of left hand 12/15/2019   Wrist pain 12/15/2019   Closed left acetabular fracture (HCC) 12/14/2019   SIRS (systemic inflammatory response syndrome) (HCC) 12/14/2019   Protein-calorie malnutrition (HCC) 05/20/2017   TIA (transient ischemic attack) 05/20/2017   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   Seizures (HCC) 05/19/2017   AKI (acute kidney injury) (HCC) 05/12/2017   Stroke (HCC) 05/11/2017   Femur fracture (HCC) 05/11/2017   Chest pain 02/09/2016   HTN (hypertension) 02/09/2016   Dementia with behavioral disturbance (HCC) 02/09/2016   Chest pain at rest 02/09/2016   Cerebral infarction (HCC) 02/01/2014   Benign essential HTN 02/01/2014   PCP:  [facility] Pharmacy:   [facility] - [place], [place] - [address] [place] [address] Phone: [phone] Fax: [phone]     Social Determinants of Health (SDOH) Interventions    Readmission Risk Interventions No flowsheet data  found.

## 2021-03-13 NOTE — Progress Notes (Signed)
OT Cancellation Note  Patient Details Name: Johnathan Mitchell. MRN: [id number] DOB: 06-18-1934   Cancelled Treatment:    Reason Eval/Treat Not Completed: Patient not medically ready;Other (comment) ([name], will complete evaluation when medically appropriate.)  Alfonzo Beers, OTD, OTR/L Acute Rehab [phone]) [phone]   Mayer Masker 03/13/2021, 1:33 PM

## 2021-03-13 NOTE — Assessment & Plan Note (Signed)
-  Poor oral intake related dementia.   -Continue IV hydration

## 2021-03-13 NOTE — Assessment & Plan Note (Signed)
Failure to thrive  -Related to dementia, pneumonia, dehydration. -PT eval obtained.  SNF recommended -May need placement.

## 2021-03-13 NOTE — Assessment & Plan Note (Signed)
Supportive care. 

## 2021-03-13 NOTE — Assessment & Plan Note (Signed)
Hypophosphatemia -Potassium level running low consistently despite replacement. -2.5 this morning.  Oral and IV repletion ordered.  Recheck potassium this afternoon. -Phosphorus level low at 1.9.  IV potassium phosphate ordered Recent Labs  Lab 03/09/21 2321 03/12/21 0000 03/12/21 0700 03/13/21 0950  K 2.9* 2.3* 3.0* 2.5*  MG  --  2.0   1.9 1.8 1.7  PHOS  --   --  1.9* 2.7

## 2021-03-13 NOTE — Plan of Care (Signed)
?  Problem: Elimination: ?Goal: Will not experience complications related to bowel motility ?Outcome: Progressing ?  ?Problem: Pain Managment: ?Goal: General experience of comfort will improve ?Outcome: Progressing ?  ?Problem: Safety: ?Goal: Ability to remain free from injury will improve ?Outcome: Progressing ?  ?

## 2021-03-13 NOTE — Progress Notes (Addendum)
Neurology Progress Note Johnathan Mitchell. MR# [id number] 03/13/2021   S: No overnight events; Says ouch frequently during neuro exam   Spoke with daughter Johnathan Mitchell for update. She is concerned he is not being fed as he cannot feed himself. I let her know I added to recommendations and sent message to RN. She did not have any further concerns. She has decided that she ill start looking for SNF/ALF for patient rather than return home so that his daily needs are being met. He is not appropriate to go home alone at discharge.    O: Current vital signs: BP 104/78 (BP Location: Right Arm)    Pulse 66    Temp 98.3 F (36.8 C) (Axillary)    Resp 16    Wt 58.3 kg    SpO2 99%    BMI 20.74 kg/m  Vital signs in last 24 hours: Temp:  [97.7 F (36.5 C)-98.5 F (36.9 C)] 98.3 F (36.8 C) (02/14 1150) Pulse Rate:  [66-93] 66 (02/14 1150) Resp:  [0-19] 16 (02/14 1213) BP: (102-142)/(70-95) 104/78 (02/14 1150) SpO2:  [92 %-99 %] 99 % (02/14 1150) Weight:  [58.3 kg] 58.3 kg (02/14 0455) GENERAL: Laying bed , no acute distress HEENT: Normocephalic and atraumatic, moist mm, no LN++, no thyromegaly LUNGS: symmetric excursions bilaterally with no audible wheezes. CV: RR, equal pulses bilaterally. ABDOMEN: Soft, nontender, nondistended with normoactive BS Ext: warm, no edema, when asked to squeeze my hand or attempting to do sensory exam pt says ouch due to arthritic pain in joints  NEURO:  Mental Status: Will not open eyes without assistance and then shuts them back tight Language: speech is fluent.  PERR. EOMI, unable to assess visual fields or facial sensory. no facial asymmetry,hearing intact. No evidence of tongue atrophy or fibrillations, tongue/uvula/soft palate midline  Poor muscle strength. Motor: unable to ascertain strength in all extremities as patient withdraws to arthritic pain. He does move all extremities Tone: Tone is increased and bulk is poor Sensation: able to assess due to pt not  cooperative with exam Coordination: unable to assess due to pt not cooperative with exam Gait - Pt wheelchair bound at baseline  Labs Magnesium - 1.7 and optimized with Mag 2gmx1. Potassium 2.5 and replaced PO& IV    Component Value Date/Time   WBC 7.4 03/12/2021 0700   RBC 3.96 (L) 03/12/2021 0700   HGB 10.4 (L) 03/12/2021 0700   HCT 32.9 (L) 03/12/2021 0700   PLT 214 03/12/2021 0700   MCV 83.1 03/12/2021 0700   MCH 26.3 03/12/2021 0700   MCHC 31.6 03/12/2021 0700   RDW 16.8 (H) 03/12/2021 0700   LYMPHSABS 1.9 03/12/2021 0700   MONOABS 0.9 03/12/2021 0700   EOSABS 0.1 03/12/2021 0700   BASOSABS 0.0 03/12/2021 0700       Component Value Date/Time   NA 137 03/12/2021 0700   NA 136 (A) 06/13/2017 0000   K 2.5 (LL) 03/13/2021 0950   CL 105 03/12/2021 0700   CO2 27 03/12/2021 0700   GLUCOSE 87 03/12/2021 0700   BUN 9 03/12/2021 0700   BUN 13 06/13/2017 0000   CREATININE 0.66 03/12/2021 0700   CALCIUM 7.9 (L) 03/12/2021 0700   CALCIUM 8.2 (L) 05/15/2017 0421   PROT 5.8 (L) 03/12/2021 0700   ALBUMIN 2.5 (L) 03/12/2021 0700   AST 15 03/12/2021 0700   ALT 13 03/12/2021 0700   ALKPHOS 57 03/12/2021 0700   BILITOT 0.4 03/12/2021 0700   GFRNONAA >60 03/12/2021 0700  GFRAA >60 09/28/2019 1755       Component Value Date/Time   CHOL 177 05/12/2017 0429   TRIG 69 05/12/2017 0429   HDL 72 05/12/2017 0429   CHOLHDL 2.5 05/12/2017 0429   VLDL 14 05/12/2017 0429   LDLCALC 91 05/12/2017 0429    Medications: Scheduled Meds:  doxycycline  100 mg Oral Q12H   levETIRAcetam  500 mg Oral BID   phosphorus  500 mg Oral TID   pneumococcal 23 valent vaccine  0.5 mL Intramuscular Tomorrow-1000   Continuous Infusions:  levETIRAcetam 500 mg (03/13/21 1020)   magnesium sulfate bolus IVPB     potassium chloride 10 mEq (03/13/21 1211)   PRN Meds:.acetaminophen **OR** acetaminophen  Imaging I have reviewed images in epic and the results pertinent to this consultation are: CT Head  was negative for a large hypodensity concerning for a large territory infarct or hyperdensity concerning for an ICH   Assessment:  Johnathan Mitchell. is a 87 y.o. male with PMH significant for prior stroke with right-sided residual weakness is wheelchair-bound, dementia, hypertension, seizure on Keppra who is admitted with poor PO intake and dehydration. He had a 2 mins seizure with left gaze deviation and elevated HR and gradually returned to baseline.   Daughter apparently misunderstood and has only been giving him Keppra at home once a day. Also reported that she forgot to give him Keppra on 03/11/21.   It is also possible that dehydration from poor po intake along with recent CAP, probably decreased his seizure threshold.  Recommendations: - continue Keppra 525m BID. - Recommend Ativan 196mfor seizure lasting more than 3 mins. - Check Mag, Potassium and Phos daily and optimize  - Watch calcium. Has decreased daily and slightly low 7.9 today.  - Nursing will need to offer to feed patient because he cannot feed due to poor coordination per daughter.   Neurology to sign off. Call with questions.    Electronically signed by:  JeParke PoissonNeurology NP Can be reached on Epic Secure Messenger 03/13/2021, 12:35 PM  If 7pm- 7am, please page neurology on call as listed in [facility] ATTENDING ATTESTATION:  Dr. PaReeves Forthvaluated pt independently, reviewed imaging, chart, labs. Discussed and formulated plan with the APP. Please see APP note above for details.   Total 36 minutes spent on counseling patient and coordinating care, writing notes and reviewing chart.  Imara Standiford,MD

## 2021-03-14 LAB — CBC WITH DIFFERENTIAL/PLATELET
Abs Immature Granulocytes: 0.03 10*3/uL (ref 0.00–0.07)
Basophils Absolute: 0 10*3/uL (ref 0.0–0.1)
Basophils Relative: 1 %
Eosinophils Absolute: 0.2 10*3/uL (ref 0.0–0.5)
Eosinophils Relative: 2 %
HCT: 30.6 % — ABNORMAL LOW (ref 39.0–52.0)
Hemoglobin: 10 g/dL — ABNORMAL LOW (ref 13.0–17.0)
Immature Granulocytes: 0 %
Lymphocytes Relative: 24 %
Lymphs Abs: 1.8 10*3/uL (ref 0.7–4.0)
MCH: 26.5 pg (ref 26.0–34.0)
MCHC: 32.7 g/dL (ref 30.0–36.0)
MCV: 81 fL (ref 80.0–100.0)
Monocytes Absolute: 0.7 10*3/uL (ref 0.1–1.0)
Monocytes Relative: 10 %
Neutro Abs: 4.7 10*3/uL (ref 1.7–7.7)
Neutrophils Relative %: 63 %
Platelets: 223 10*3/uL (ref 150–400)
RBC: 3.78 MIL/uL — ABNORMAL LOW (ref 4.22–5.81)
RDW: 16.7 % — ABNORMAL HIGH (ref 11.5–15.5)
WBC: 7.5 10*3/uL (ref 4.0–10.5)
nRBC: 0 % (ref 0.0–0.2)

## 2021-03-14 LAB — PHOSPHORUS: Phosphorus: 3.3 mg/dL (ref 2.5–4.6)

## 2021-03-14 LAB — BASIC METABOLIC PANEL
Anion gap: 10 (ref 5–15)
BUN: <5 mg/dL — ABNORMAL LOW (ref 8–23)
CO2: 28 mmol/L (ref 22–32)
Calcium: 8.2 mg/dL — ABNORMAL LOW (ref 8.9–10.3)
Chloride: 102 mmol/L (ref 98–111)
Creatinine, Ser: 0.54 mg/dL — ABNORMAL LOW (ref 0.61–1.24)
GFR, Estimated: >60 mL/min (ref >60)
Glucose, Bld: 76 mg/dL (ref 70–99)
Potassium: 3.3 mmol/L — ABNORMAL LOW (ref 3.5–5.1)
Sodium: 140 mmol/L (ref 135–145)

## 2021-03-14 LAB — MAGNESIUM: Magnesium: 1.8 mg/dL (ref 1.7–2.4)

## 2021-03-14 LAB — PROCALCITONIN: Procalcitonin: <0.1 ng/mL

## 2021-03-14 MED ORDER — POTASSIUM CHLORIDE 20 MEQ PO PACK
40.0000 meq | PACK | Freq: Once | ORAL | Status: AC
  Administered 2021-03-14: 40 meq via ORAL
  Filled 2021-03-14: qty 2

## 2021-03-14 NOTE — TOC Progression Note (Signed)
Transition of Care (TOC) - Progression Note    Patient Details  Name: Johnathan Mitchell. MRN: [id number] Date of Birth: 08/07/1934  Transition of Care [facility]) CM/SW Contact  Lorri Frederick, LCSW Phone Number: 03/14/2021, 2:33 PM  Clinical Narrative:   CSW spoke with daughter Asher Muir, she confirmed she does want to move forward with [facility] with plan for [facility] after SNF.  CSW confirmed with [facility] that they can accept pt.  Per MD, expected DC tomorrow.      Expected Discharge Plan: Skilled Nursing Facility Barriers to Discharge: Continued Medical Work up, SNF Pending bed offer  Expected Discharge Plan and Services Expected Discharge Plan: Skilled Nursing Facility     Post Acute Care Choice: Skilled Nursing Facility Living arrangements for the past 2 months: Single Family Home                                       Social Determinants of Health (SDOH) Interventions    Readmission Risk Interventions No flowsheet data found.

## 2021-03-14 NOTE — TOC Progression Note (Signed)
Transition of Care (TOC) - Progression Note    Patient Details  Name: Johnathan Mitchell. MRN: [id number] Date of Birth: 19-Jan-1935  [facility]) CM/SW Contact  Carley Hammed, [place] Phone Number: 03/14/2021, 9:48 AM  Clinical Narrative:    CSW spoke with Shanda Bumps at [facility] who noted that she has beds available for ALF [facility], and would be willing to walk pt's family through the process of utilizing the [facility] to [facility] with [facility]. They also work closely with [facility] and asked that CSW follow up with [facility] to see if they can offer.  CSW spoke with pt's daughter who would like to speak with Shanda Bumps for placement assistance, contact information was provided. She is also interested in [place] [facility], CSW requested the facility review referral. Other bed offers were provided to dtr. TOC will continue to follow for DC needs.   Expected Discharge Plan: Skilled Nursing Facility Barriers to Discharge: Continued Medical Work up, SNF Pending bed offer  Expected Discharge Plan and Services Expected Discharge Plan: Skilled Nursing Facility     Post Acute Care Choice: Skilled Nursing Facility Living arrangements for the past 2 months: Single Family Home                                       Social Determinants of Health (SDOH) Interventions    Readmission Risk Interventions No flowsheet data found.

## 2021-03-14 NOTE — Evaluation (Addendum)
Occupational Therapy Evaluation Patient Details Name: Johnathan Mitchell. MRN: [id number] DOB: 1934/03/11 Today's Date: 03/14/2021   History of Present Illness Pt is an 87 y/o M presenting to [facility] ED on 2/12 for seizure like activity, transferred to [facility] for further evaluation. 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 Pt dependent for all ADLs this session, max -total A +2 for bed mobility. Demonstrates posterior/L  lateral lean and is unable to sit upright at EOB. Pt presents with generalized weakness, minimally able to move BLE and ~50% RUE shoulder flexion, LUE ~25% with hand contracture noted. Pt repositioned to support LUE while in bed. Pt presenting with impairments listed below, will follow. Recommend SNF at d/c.     Recommendations for follow up therapy are one component of a multi-disciplinary discharge planning process, led by the attending physician.  Recommendations may be updated based on patient status, additional functional criteria and insurance authorization.   Follow Up Recommendations  Skilled nursing-short term rehab (<3 hours/day)    Assistance Recommended at Discharge Intermittent Supervision/Assistance  Patient can return home with the following Two people to help with walking and/or transfers;Two people to help with bathing/dressing/bathroom;Assistance with cooking/housework;Assistance with feeding;Direct supervision/assist for medications management;Direct supervision/assist for financial management;Assist for transportation;Help with stairs or ramp for entrance    Functional Status Assessment  Patient has had a recent decline in their functional status and demonstrates the ability to make significant improvements in function in a reasonable and  predictable amount of time.  Equipment Recommendations  None recommended by OT;Other (comment) (deferred to next venue of care)    Recommendations for Other Services PT consult     Precautions / Restrictions Precautions Precautions: Fall Precaution Comments: LUE/LLE paresis Restrictions Weight Bearing Restrictions: No      Mobility Bed Mobility Overal bed mobility: Needs Assistance Bed Mobility: Sit to Supine, Supine to Sit     Supine to sit: Max assist, +2 for physical assistance Sit to supine: Max assist, +2 for physical assistance   General bed mobility comments: able to minimally assist by moving LLE toward EOB, however max A +2, strong posterior lean unable to fully sit upright    Transfers                   General transfer comment: deferred      Balance Overall balance assessment: Needs assistance Sitting-balance support: Single extremity supported, Feet supported Sitting balance-Leahy Scale: Zero Sitting balance - Comments: L lateral lean                                   ADL either performed or assessed with clinical judgement   ADL                                               Vision   Vision Assessment?: No apparent visual deficits     Perception     Praxis      Pertinent Vitals/Pain Pain Assessment Pain Assessment: Faces (Simultaneous filing. User may not have seen previous data.) Pain Score: 5  Faces Pain Scale: Hurts even more (Simultaneous filing. User may not have seen previous data.) Pain Location: generalized, abdomen Pain Descriptors / Indicators: Discomfort, Moaning Pain Intervention(s): Limited activity within patient's tolerance, Monitored during session     Hand Dominance Right   Extremity/Trunk Assessment Upper Extremity Assessment Upper Extremity Assessment: Generalized weakness;LUE deficits/detail LUE Deficits / Details: <25% ROM in shoulder, L hand contracture from prior CVA    Lower Extremity Assessment Lower Extremity Assessment: Defer to PT evaluation   Cervical / Trunk Assessment Cervical / Trunk Assessment: Kyphotic   Communication Communication Communication: Expressive difficulties (dysarthric and difficult to understand at times)   Cognition Arousal/Alertness: Awake/alert Behavior During Therapy: Flat affect Overall Cognitive Status: Impaired/Different from baseline Area of Impairment: Orientation, Safety/judgement, Following commands                 Orientation Level: Time, Situation     Following Commands: Follows one step commands with increased time Safety/Judgement: Decreased awareness of safety, Decreased awareness of deficits     General Comments: knows he is at [facility] hospital, unaware of month/year. Able to respond to simple questions, however answers questionably as pt is poor historian.     General Comments  VSS on RA    Exercises Exercises: General Upper Extremity, General Lower Extremity General Exercises - Upper Extremity Shoulder Flexion: AROM, Right, Supine   Shoulder Instructions      Home Living Family/patient expects to be discharged to:: Skilled nursing facility                            Home Equipment: Wheelchair - manual          Prior Functioning/Environment Prior Level of Function : Patient poor historian/Family not available             Mobility Comments: family assists with w/c transfers per chart review ADLs Comments: States he recieves assistance with ADLs from family (unclear, pt states assistance received from mother and uncle)        OT Problem List: Decreased strength;Decreased activity tolerance;Decreased range of motion;Impaired balance (sitting and/or standing);Decreased cognition;Decreased safety awareness;Pain;Impaired UE functional use      OT Treatment/Interventions: Self-care/ADL training;Therapeutic exercise;Neuromuscular education;Energy conservation;Balance  training;Patient/family education;Therapeutic activities    OT Goals(Current goals can be found in the care plan section) Acute Rehab OT Goals Patient Stated Goal: none stated OT Goal Formulation: Patient unable to participate in goal setting Time For Goal Achievement: 03/28/21 Potential to Achieve Goals: Fair ADL Goals Pt Will Perform Eating: with adaptive utensils;with mod assist;bed level;with caregiver independent in assisting Pt Will Perform Grooming: with mod assist;bed level;with caregiver independent in assisting Pt Will Perform Upper Body Bathing: with caregiver independent in assisting;bed level;with mod assist  OT Frequency: Min 2X/week    Co-evaluation              AM-PAC OT "6 Clicks" Daily Activity     Outcome Measure Help from another person eating meals?: Total Help from another person taking care of personal grooming?: Total Help from another person toileting, which includes using toliet, bedpan, or urinal?: Total Help from another person bathing (including washing, rinsing, drying)?: Total Help from another person to put on and taking off regular upper body clothing?: Total Help from another person to put on and taking off regular lower body clothing?: Total 6 Click Score: 6   End of Session Nurse Communication: Mobility status;Other (comment) (pt requesting assistance with eating lunch)  Activity  Tolerance: Patient limited by fatigue;Patient limited by lethargy;Patient limited by pain Patient left: in bed;with call bell/phone within reach;with bed alarm set  OT Visit Diagnosis: Muscle weakness (generalized) (M62.81)                Time: 4196-2229 OT Time Calculation (min): 14 min Charges:  OT General Charges $OT Visit: 1 Visit OT Evaluation $OT Eval Moderate Complexity: 1 [address] OTD, OTR/L Acute Rehab [phone]) [phone]  Mayer Masker 03/14/2021, 4:49 PM

## 2021-03-14 NOTE — Plan of Care (Signed)
  Problem: Clinical Measurements: Goal: Respiratory complications will improve Outcome: Progressing   Problem: Nutrition: Goal: Adequate nutrition will be maintained Outcome: Progressing   Problem: Coping: Goal: Level of anxiety will decrease Outcome: Progressing   

## 2021-03-14 NOTE — Evaluation (Addendum)
Clinical/Bedside Swallow Evaluation Patient Details  Name: Johnathan Mitchell. MRN: [id number] Date of Birth: 1934-02-06  Today's Date: 03/14/2021 Time: SLP Start Time (ACUTE ONLY): 1423 SLP Stop Time (ACUTE ONLY): 1445 SLP Time Calculation (min) (ACUTE ONLY): 22 min  Past Medical History:  Past Medical History:  Diagnosis Date   Asthma    Dementia (HCC)    Gout    Hypertension    Seizures (HCC)    Stroke [facility])    Past Surgical History:  Past Surgical History:  Procedure Laterality Date   BACK SURGERY     FEMUR IM NAIL Left 05/13/2017   Procedure: INTRAMEDULLARY (IM) NAIL FEMORAL;  Surgeon: [name], MD;  Location: [facility] OR;  Service: Orthopedics;  Laterality: Left;   KNEE SURGERY     HPI:  Pt is an 87 y.o. male who was previously evaluated at [facility] ED on 2/10, diagnosed with CAP, and discharged home on oral doxycycline, but then returned to [facility] ED on 2/12 due to report of declined oral intake and acute generalized weakness. Pt had seizure-like activity in the ED and was transferred to [facility]. SLP  consulted due to "Frequent coughing on bacon and french toast". CT abdomen 2/13: Increased atelectasis or consolidation in the left lower lobe, background chronic changes with bilateral infrahilar lower lobe bronchiectasis. CT head negative for acute changes. PMH: dementia, seizure disorder, essential hypertension.    Assessment / Plan / Recommendation  Clinical Impression  Pt was seen for bedside swallow evaluation. He did not present as a reliable historian due to the variability of his answers. His daughter, Asher Muir, was contacted via phone and she described pt having a diet similar to dysphagia 2 at home. She stated that she finely chops/dices the pt's foods and purees some, but that he would not be pleased if all of his meals are pureed. Per the pt's daughter, pt "gets choked" sometimes if he is going too fast. Oral mechanism exam was incomplete due to pt's difficulty following commands. He  presented with seven anterior mandibular teeth only. Pt inconsistently reported using dentures, but his daughter confirmed that he does not own any. Pt tolerated puree and thin liquids via straw without overt s/sx of aspiration. Coughing was inconsistently noted with dysphagia 3 solids, suggesting possible aspiration. Mastication was moderately prolonged and liquid washes assisted with oral clearance. A dysphagia 2 diet with thin liquids is recommended at this time. SLP will follow pt to ensure diet tolerance and to assess further needs. SLP Visit Diagnosis: Dysphagia, unspecified (R13.10)    Aspiration Risk  Mild aspiration risk;Moderate aspiration risk    Diet Recommendation Thin liquid;Dysphagia 2 (Fine chop)   Liquid Administration via: Cup;Straw Medication Administration: Crushed with puree Supervision: Patient able to self feed Compensations: Slow rate;Small sips/bites;Follow solids with liquid Postural Changes: Seated upright at 90 degrees    Other  Recommendations Oral Care Recommendations: Oral care BID    Recommendations for follow up therapy are one component of a multi-disciplinary discharge planning process, led by the attending physician.  Recommendations may be updated based on patient status, additional functional criteria and insurance authorization.  Follow up Recommendations No SLP follow up      Assistance Recommended at Discharge Frequent or constant Supervision/Assistance  Functional Status Assessment Patient has not had a recent decline in their functional status  Frequency and Duration min 2x/week  2 weeks       Prognosis Prognosis for Safe Diet Advancement: Good Barriers to Reach Goals: Cognitive deficits  Swallow Study   General Date of Onset: 03/13/21 HPI: Pt is an 87 y.o. male who was previously evaluated at [facility] ED on 2/10, diagnosed with CAP, and discharged home on oral doxycycline, but then returned to [facility] ED on 2/12 due to report of declined oral  intake and acute generalized weakness. Pt had seizure-like activity in the ED and was transferred to [facility]. SLP  consulted due to "Frequent coughing on bacon and french toast". CT abdomen 2/13: Increased atelectasis or consolidation in the left lower lobe, background chronic changes with bilateral infrahilar lower lobe bronchiectasis. CT head negative for acute changes. PMH: dementia, seizure disorder, essential hypertension. Type of Study: Bedside Swallow Evaluation Previous Swallow Assessment: none Diet Prior to this Study: Regular;Thin liquids Temperature Spikes Noted: No Respiratory Status: Room air History of Recent Intubation: No Behavior/Cognition: Cooperative;Alert;Requires cueing Oral Cavity Assessment: Within Functional Limits Oral Care Completed by SLP: No Oral Cavity - Dentition: Missing dentition;Poor condition Vision: Functional for self-feeding Self-Feeding Abilities: Able to feed self Patient Positioning: Upright in bed;Postural control adequate for testing Baseline Vocal Quality: Normal Volitional Cough: Cognitively unable to elicit Volitional Swallow: Able to elicit    Oral/Motor/Sensory Function Overall Oral Motor/Sensory Function:  (difficult to assess)   Ice Chips Ice chips: Not tested   Thin Liquid Thin Liquid: Within functional limits Presentation: Straw    Nectar Thick Nectar Thick Liquid: Not tested   Honey Thick Honey Thick Liquid: Not tested   Puree Puree: Within functional limits Presentation: Spoon   Solid     Solid: Impaired Oral Phase Impairments: Impaired mastication Oral Phase Functional Implications: Impaired mastication Pharyngeal Phase Impairments: Cough - Immediate;Cough - Delayed     Malcolm Quast I. Vear Clock, MS, CCC-SLP Acute Rehabilitation Services Office number [phone] Pager [phone]  Scheryl Marten 03/14/2021,3:11 PM

## 2021-03-14 NOTE — Progress Notes (Signed)
PROGRESS NOTE    Johnathan Mitchell.  QG:[id number] DOB: 1934/05/17 DOA: 03/11/2021 PCP: [facility]   Brief Narrative:  Johnathan Chhabra. is a 87 y.o. male with PMH significant for dementia, stroke with left-sided deficits, seizure, HTN, gout, asthma. At baseline, patient is alert and is taken care of by home health aide.  Per ED documentation from 2/10, whenever the granddaughter is at home by herself, she calls EMS because she is unable to take care of him.  Apparently patient's home health take is off now for 3 weeks.  EMS had to bring him to [facility] ED on 2/10 for the same reason.  Chest x-ray on 2/10 showed patchy opacity in left lung base.  Patient was started on oral doxycycline and discharged home. EMS was called again on 2/12 after family noted him having twitching of his hands and eyes rolling to the back of the head.  Family was also concerned about his decreased appetite and poor overall performance.  Patient was brought to the ED again.   In the ED, patient was afebrile, hemodynamically stable. Labs with potassium low at 2.3 EKG with sinus rhythm at 89, QTc prolonged at 546 Chest x-ray did not show any acute cardiopulmonary process Urinalysis with clear yellow urine, no leukocytes or bacteria Patient was given potassium replacement, IV fluid. While in the ED, patient had another seizure-like activity.  Neurology was consulted. Patient was admitted and transferred to hospitalist service at [facility].  Assessment & Plan:   Principal Problem:   Hypokalemia Active Problems:   HTN (hypertension)   Dementia with behavioral disturbance   Dehydration   Prolonged QT interval   Generalized weakness   Seizure (HCC)   Pressure injury of skin   Hypophosphatemia   CAP (community acquired pneumonia)   Persistent hypokalemia- (present on admission) Hypophosphatemia -Potassium level running low consistently despite replacement. - 3.3 this morning.  Will  replace.  Seizure ([place]) -Twitching and eye rolling noted at home and in the ED -Since his previous stroke, patient was on Keppra.  Per history, family was mistakenly given Keppra only once a day.   -Neurology consult appreciated.  Keppra 500 mg twice daily resumed.  Patient is stable and he has not had any further episodes.   CAP (community acquired pneumonia) -Diagnosed in 2/10 based on left lung opacity chest x-ray  -Complete the course of doxycycline.   HTN (hypertension)- (present on admission) -Currently Norvasc on hold.  Blood pressure controlled   Dementia with behavioral disturbance- (present on admission) -Supportive care   Prolonged QT interval- (present on admission) -EKG on admission showed QTc of 546.   -Cardiac electrolyte levels.  Monitor EKG    Dehydration- (present on admission) -Poor oral intake related dementia.   -Continue IV hydration    Generalized weakness Failure to thrive  -Related to dementia, pneumonia, dehydration. -PT eval obtained.  SNF recommended.  TOC working with family.    DVT prophylaxis: SCDs Start: 03/12/21 0409   Code Status: Full Code  Family Communication:  None present at bedside.  TOC working with family for placement.  Status is: Inpatient Remains inpatient appropriate because: Medically stable, needs SNF placement           Estimated body mass index is 19.89 kg/m as calculated from the following:   Height as of 01/14/21: 5\' 6"  (1.676 m).   Weight as of this encounter: 55.9 kg.  Pressure Injury 03/12/21 Buttocks Right Stage 2 -  Partial thickness loss of  dermis presenting as a shallow open injury with a red, pink wound bed without slough. (Active)  03/12/21 1730  Location: Buttocks  Location Orientation: Right  Staging: Stage 2 -  Partial thickness loss of dermis presenting as a shallow open injury with a red, pink wound bed without slough.  Wound Description (Comments):   Present on Admission: Yes   Nutritional  Assessment: Body mass index is 19.89 kg/m.Marland Kitchen Seen by dietician.  I agree with the assessment and plan as outlined below: Nutrition Status:        . Skin Assessment: I have examined the patient's skin and I agree with the wound assessment as performed by the wound care RN as outlined below: Pressure Injury 03/12/21 Buttocks Right Stage 2 -  Partial thickness loss of dermis presenting as a shallow open injury with a red, pink wound bed without slough. (Active)  03/12/21 1730  Location: Buttocks  Location Orientation: Right  Staging: Stage 2 -  Partial thickness loss of dermis presenting as a shallow open injury with a red, pink wound bed without slough.  Wound Description (Comments):   Present on Admission: Yes    Consultants:  None   Procedures:  None  Antimicrobials:  Anti-infectives (From admission, onward)    Start     Dose/Rate Route Frequency Ordered Stop   03/13/21 1015  doxycycline (VIBRA-TABS) tablet 100 mg        100 mg Oral Every 12 hours 03/13/21 0929     03/12/21 0800  doxycycline (VIBRAMYCIN) 100 mg in sodium chloride 0.9 % 250 mL IVPB  Status:  Discontinued        100 mg 125 mL/hr over 120 Minutes Intravenous Every 12 hours 03/12/21 0659 03/13/21 0928         Subjective: Seen and examined this morning.  Patient was sleepy but easily arousable.  He appears to be mostly oriented.  Looks comfortable.  No complaints.  Objective: Vitals:   03/14/21 0456 03/14/21 0500 03/14/21 0724 03/14/21 1212  BP:   106/75 122/81  Pulse:   71 70  Resp:   11 12  Temp: 97.9 F (36.6 C)  98 F (36.7 C) 98.1 F (36.7 C)  TempSrc: Oral  Oral Axillary  SpO2:   98% 95%  Weight:  55.9 kg      Intake/Output Summary (Last 24 hours) at 03/14/2021 1341 Last data filed at 03/13/2021 2253 Gross per 24 hour  Intake 904.26 ml  Output --  Net 904.26 ml   Filed Weights   03/13/21 0455 03/14/21 0500  Weight: 58.3 kg 55.9 kg    Examination:  General exam: Appears calm and  comfortable but slightly sleepy. Respiratory system: Diminished breath sounds due to poor inspiratory effort. Cardiovascular system: S1 & S2 heard, RRR. No JVD, murmurs, rubs, gallops or clicks. No pedal edema. Gastrointestinal system: Abdomen is nondistended, soft and nontender. No organomegaly or masses felt. Normal bowel sounds heard. Central nervous system: Slightly sleepy but arousable, partly oriented.  No focal neurological deficits. Extremities: Symmetric 5 x 5 power. Skin: No rashes, lesions or ulcers  Data Reviewed: I have personally reviewed following labs and imaging studies  CBC: Recent Labs  Lab 03/09/21 2321 03/12/21 0000 03/12/21 0700 03/14/21 0318  WBC 5.4 7.1 7.4 7.5  NEUTROABS  --  5.0 4.5 4.7  HGB 11.8* 11.0* 10.4* 10.0*  HCT 37.0* 33.7* 32.9* 30.6*  MCV 81.7 81.2 83.1 81.0  PLT 230 245 214 [id number]   Basic Metabolic Panel: Recent Labs  Lab 03/09/21 2321 03/12/21 0000 03/12/21 0700 03/13/21 0950 03/13/21 1501 03/14/21 0318  NA 137 141 137  --  139 140  K 2.9* 2.3* 3.0* 2.5* 2.5* 3.3*  CL 97* 102 105  --  103 102  CO2 32 29 27  --  29 28  GLUCOSE 92 118* 87  --  88 76  BUN 17 12 9   --  <5* <5*  CREATININE 0.66 0.50* 0.66  --  0.62 0.54*  CALCIUM 8.9 8.6* 7.9*  --  8.4* 8.2*  MG  --  2.0   1.9 1.8 1.7  --  1.8  PHOS  --   --  1.9* 2.7  --  3.3   GFR: Estimated Creatinine Clearance: 54.3 mL/min (A) (by C-G formula based on SCr of 0.54 mg/dL (L)). Liver Function Tests: Recent Labs  Lab 03/09/21 2321 03/12/21 0000 03/12/21 0700  AST 15 21 15   ALT 12 15 13   ALKPHOS 69 68 57  BILITOT 0.8 0.4 0.4  PROT 7.3 6.8 5.8*  ALBUMIN 3.0* 2.7* 2.5*   Recent Labs  Lab 03/12/21 0000  LIPASE 38   No results for input(s): AMMONIA in the last 168 hours. Coagulation Profile: No results for input(s): INR, PROTIME in the last 168 hours. Cardiac Enzymes: No results for input(s): CKTOTAL, CKMB, CKMBINDEX, TROPONINI in the last 168 hours. BNP (last 3  results) No results for input(s): PROBNP in the last 8760 hours. HbA1C: No results for input(s): HGBA1C in the last 72 hours. CBG: No results for input(s): GLUCAP in the last 168 hours. Lipid Profile: No results for input(s): CHOL, HDL, LDLCALC, TRIG, CHOLHDL, LDLDIRECT in the last 72 hours. Thyroid Function Tests: Recent Labs    03/12/21 0010  TSH 1.235   Anemia Panel: No results for input(s): VITAMINB12, FOLATE, FERRITIN, TIBC, IRON, RETICCTPCT in the last 72 hours. Sepsis Labs: Recent Labs  Lab 03/14/21 0318  PROCALCITON <0.10    Recent Results (from the past 240 hour(s))  Resp Panel by RT-PCR (Flu A&B, Covid) Nasopharyngeal Swab     Status: None   Collection Time: 03/12/21  4:07 AM   Specimen: Nasopharyngeal Swab; Nasopharyngeal(NP) swabs in vial transport medium  Result Value Ref Range Status   SARS Coronavirus 2 by RT PCR NEGATIVE NEGATIVE Final    Comment: (NOTE) SARS-CoV-2 target nucleic acids are NOT DETECTED.  The SARS-CoV-2 RNA is generally detectable in upper respiratory specimens during the acute phase of infection. The lowest concentration of SARS-CoV-2 viral copies this assay can detect is 138 copies/mL. A negative result does not preclude SARS-Cov-2 infection and should not be used as the sole basis for treatment or other patient management decisions. A negative result may occur with  improper specimen collection/handling, submission of specimen other than nasopharyngeal swab, presence of viral mutation(s) within the areas targeted by this assay, and inadequate number of viral copies(<138 copies/mL). A negative result must be combined with clinical observations, patient history, and epidemiological information. The expected result is Negative.  Fact Sheet for Patients:  EntrepreneurPulse.com.au  Fact Sheet for Healthcare Providers:  IncredibleEmployment.be  This test is no t yet approved or cleared by the [place] FDA and  has been authorized for detection and/or diagnosis of SARS-CoV-2 by FDA under an Emergency Use Authorization (EUA). This EUA will remain  in effect (meaning this test can be used) for the duration of the COVID-19 declaration under Section 564(b)(1) of the Act, 21 U.S.C.section 360bbb-3(b)(1), unless the authorization is terminated  or revoked  sooner.       Influenza A by PCR NEGATIVE NEGATIVE Final   Influenza B by PCR NEGATIVE NEGATIVE Final    Comment: (NOTE) The Xpert Xpress SARS-CoV-2/FLU/RSV plus assay is intended as an aid in the diagnosis of influenza from Nasopharyngeal swab specimens and should not be used as a sole basis for treatment. Nasal washings and aspirates are unacceptable for Xpert Xpress SARS-CoV-2/FLU/RSV testing.  Fact Sheet for Patients: EntrepreneurPulse.com.au  Fact Sheet for Healthcare Providers: IncredibleEmployment.be  This test is not yet approved or cleared by the [place] FDA and has been authorized for detection and/or diagnosis of SARS-CoV-2 by FDA under an Emergency Use Authorization (EUA). This EUA will remain in effect (meaning this test can be used) for the duration of the COVID-19 declaration under Section 564(b)(1) of the Act, 21 U.S.C. section 360bbb-3(b)(1), unless the authorization is terminated or revoked.  Performed at [facility], [place] [address], [place], [place] [address]      Radiology Studies: No results found.  Scheduled Meds:  doxycycline  100 mg Oral Q12H   levETIRAcetam  500 mg Oral BID   pneumococcal 23 valent vaccine  0.5 mL Intramuscular Tomorrow-1000   Continuous Infusions:  sodium chloride 75 mL/hr at 03/14/21 1128   levETIRAcetam 500 mg (03/13/21 2253)     LOS: 2 days   Time spent: 35 minutes  Darliss Cheney, MD [facility]  03/14/2021, 1:41 PM  Please page via [facility] and do not message via secure chat for urgent patient  care matters. Secure chat can be used for non urgent patient care matters.  How to contact the [facility] Attending or Consulting provider [place] or covering provider during after hours [place], for this patient?  Check the care team in [facility] and look for a) attending/consulting [facility] provider listed and b) the [facility] team listed. Page or secure chat [phone]. Log into [email or web address] and use [place]'s universal password to access. If you do not have the password, please contact the hospital operator. Locate the [facility] provider you are looking for under [facility] and page to a number that you can be directly reached. If you still have difficulty reaching the provider, please page the [facility] (Director on Call) for the Hospitalists listed on amion for assistance.

## 2021-03-15 DIAGNOSIS — E876 Hypokalemia: Secondary | ICD-10-CM | POA: Diagnosis not present

## 2021-03-15 LAB — CBC WITH DIFFERENTIAL/PLATELET
Abs Immature Granulocytes: 0.04 10*3/uL (ref 0.00–0.07)
Basophils Absolute: 0.1 10*3/uL (ref 0.0–0.1)
Basophils Relative: 1 %
Eosinophils Absolute: 0.1 10*3/uL (ref 0.0–0.5)
Eosinophils Relative: 2 %
HCT: 29.8 % — ABNORMAL LOW (ref 39.0–52.0)
Hemoglobin: 9.6 g/dL — ABNORMAL LOW (ref 13.0–17.0)
Immature Granulocytes: 1 %
Lymphocytes Relative: 27 %
Lymphs Abs: 2.1 10*3/uL (ref 0.7–4.0)
MCH: 26.4 pg (ref 26.0–34.0)
MCHC: 32.2 g/dL (ref 30.0–36.0)
MCV: 81.9 fL (ref 80.0–100.0)
Monocytes Absolute: 0.7 10*3/uL (ref 0.1–1.0)
Monocytes Relative: 9 %
Neutro Abs: 4.8 10*3/uL (ref 1.7–7.7)
Neutrophils Relative %: 60 %
Platelets: 207 10*3/uL (ref 150–400)
RBC: 3.64 MIL/uL — ABNORMAL LOW (ref 4.22–5.81)
RDW: 16.9 % — ABNORMAL HIGH (ref 11.5–15.5)
WBC: 7.8 10*3/uL (ref 4.0–10.5)
nRBC: 0 % (ref 0.0–0.2)

## 2021-03-15 LAB — BASIC METABOLIC PANEL
Anion gap: 7 (ref 5–15)
BUN: 6 mg/dL — ABNORMAL LOW (ref 8–23)
CO2: 27 mmol/L (ref 22–32)
Calcium: 8.3 mg/dL — ABNORMAL LOW (ref 8.9–10.3)
Chloride: 103 mmol/L (ref 98–111)
Creatinine, Ser: 0.76 mg/dL (ref 0.61–1.24)
GFR, Estimated: >60 mL/min (ref >60)
Glucose, Bld: 124 mg/dL — ABNORMAL HIGH (ref 70–99)
Potassium: 3.2 mmol/L — ABNORMAL LOW (ref 3.5–5.1)
Sodium: 137 mmol/L (ref 135–145)

## 2021-03-15 MED ORDER — POTASSIUM CHLORIDE CRYS ER 20 MEQ PO TBCR: 20.0000 meq | EXTENDED_RELEASE_TABLET | Freq: Every day | ORAL | 0 refills | Status: DC

## 2021-03-15 MED ORDER — ASPIRIN EC 81 MG PO TBEC: 81.0000 mg | DELAYED_RELEASE_TABLET | Freq: Every day | ORAL | 2 refills | Status: DC

## 2021-03-15 MED ORDER — DOXYCYCLINE HYCLATE 100 MG PO TABS: 100.0000 mg | ORAL_TABLET | Freq: Two times a day (BID) | ORAL | 0 refills | Status: AC

## 2021-03-15 MED ORDER — POTASSIUM CHLORIDE CRYS ER 20 MEQ PO TBCR
40.0000 meq | EXTENDED_RELEASE_TABLET | ORAL | Status: DC
  Administered 2021-03-15: 40 meq via ORAL
  Filled 2021-03-15 (×2): qty 2

## 2021-03-15 NOTE — Discharge Summary (Addendum)
[place] Discharge Summary  Johnathan Mitchell. [id number] DOB: Sep 08, 1934 DOA: 03/11/2021  [facility]  Admit date: 03/11/2021 Discharge date: 03/15/2021 30 Day Unplanned Readmission Risk Score    Flowsheet Row ED to Hosp-Admission (Current) from 03/11/2021 in [facility]  30 Day Unplanned Readmission Risk Score (%) 17.05 Filed at 03/15/2021 0801       This score is the patient's risk of an unplanned readmission within 30 days of being discharged (0 -100%). The score is based on dignosis, age, lab data, medications, orders, and past utilization.   Low:  0-14.9   Medium: 15-21.9   High: 22-29.9   Extreme: 30 and above          Admitted From: Home Disposition: SNF  Recommendations for Outpatient Follow-up:  Follow up with PCP in 1-2 weeks Please obtain BMP/CBC in one week Please follow up with your PCP on the following pending results: Unresulted Labs (From admission, onward)     Start     Ordered   03/14/21 [id number]  Basic metabolic panel  Daily,   R     Question:  Specimen collection method  Answer:  Lab=Lab collect   03/13/21 1318   03/14/21 0500  CBC with Differential/Platelet  Daily,   R     Question:  Specimen collection method  Answer:  Lab=Lab collect   03/13/21 1318              Home Health: None Equipment/Devices: None  Discharge Condition: Stable CODE STATUS: Full code Diet recommendation: Cardiac/dysphagia 2  Subjective: Seen and examined.  He has no complaints.  He is fully alert and oriented, much better than yesterday.  Brief/Interim Summary: Johnathan Mitchell. is a 87 y.o. male with PMH significant for dementia, stroke with left-sided deficits, seizure, HTN, gout, asthma. At baseline, patient is alert and is taken care of by home health aide.  Per ED documentation from 2/10, whenever the granddaughter is at home by herself, she calls EMS because she is unable to take care of him.  Apparently  patient's home health care person is off now for 3 weeks.  EMS had to bring him to [facility] ED on 2/10 for the same reason.  Chest x-ray on 2/10 showed patchy opacity in left lung base.  Patient was started on oral doxycycline and discharged home.  EMS was called again on 2/12 after family noted him having twitching of his hands and eyes rolling to the back of the head.  Family was also concerned about his decreased appetite and poor overall performance.  Patient was brought to the ED again.  In the ED, he was afebrile and hemodynamically stable.  Potassium was 2.3. EKG with sinus rhythm at 89, QTc prolonged at 546 Chest x-ray did not show any acute cardiopulmonary process Urinalysis with clear yellow urine, no leukocytes or bacteria Patient was given potassium replacement, IV fluid. While in the ED, patient had another seizure-like activity.  Neurology was consulted.  Patient was transferred to [facility] for neurology consultation.  He was resumed on his Keppra.  It was later found out that patient was supposed to be taking Keppra 500 mg p.o. twice daily but his family was giving him only once a day erroneously.  Since this, patient did not have any further seizures.  EEG was also negative for epileptiform activity.  Neurology cleared the patient for discharge.  Patient has significantly improved.  He is fully alert and oriented  now.  He was assessed by PT OT who recommended SNF which has been arranged for him so he is going to be discharged in stable condition after family agreed.   Persistent hypokalemia- (present on admission) Hypophosphatemia -Potassium level running low consistently despite replacement.  It is low again.  He will be replaced before discharge and I am going to place him on daily potassium replacement.   CAP (community acquired pneumonia) -Diagnosed in 2/10 based on left lung opacity chest x-ray  -Complete the course of doxycycline.  He is being discharged on 4 more doses  of doxycycline.   HTN (hypertension)- (present on admission) Controlled, continue home medications.  Dehydration- (present on admission) Resolved.  Previous stroke with left-sided hemiparesis: Patient's home medication list included aspirin 325 mg however per pharmacy, daughter/granddaughter could not verify whether patient was taking this or not.  I have discharged this patient on aspirin 81 mg p.o. daily due to previous history of stroke.  Discharge plan was discussed with patient and/or family member and they verbalized understanding and agreed with it.  Discharge Diagnoses:  Principal Problem:   Hypokalemia Active Problems:   HTN (hypertension)   Dementia with behavioral disturbance   Dehydration   Prolonged QT interval   Generalized weakness   Seizure (HCC)   Pressure injury of skin   Hypophosphatemia   CAP (community acquired pneumonia)    Discharge Instructions   Allergies as of 03/15/2021       Reactions   Penicillins Hives, Swelling   Valproate Sodium    Other reaction(s): Other (See Comments)   Lexapro [escitalopram] Diarrhea        Medication List     STOP taking these medications    aspirin 325 MG tablet Replaced by: aspirin EC 81 MG tablet       TAKE these medications    acetaminophen 500 MG tablet Commonly known as: TYLENOL Take 1 tablet (500 mg total) by mouth every 8 (eight) hours. What changed:  how much to take when to take this reasons to take this   aspirin EC 81 MG tablet Take 1 tablet (81 mg total) by mouth daily. Swallow whole. Replaces: aspirin 325 MG tablet   atorvastatin 80 MG tablet Commonly known as: LIPITOR Take 1 tablet (80 mg total) by mouth daily at 6 PM.   busPIRone 5 MG tablet Commonly known as: BUSPAR Take 2.5 mg by mouth 4 (four) times daily as needed (anxiety).   Cholecalciferol 25 MCG (1000 UT) capsule Take 1,000 Units by mouth daily.   diclofenac Sodium 1 % Gel Commonly known as: VOLTAREN Apply 2 g  topically 4 (four) times daily as needed for pain.   doxycycline 100 MG tablet Commonly known as: VIBRA-TABS Take 1 tablet (100 mg total) by mouth every 12 (twelve) hours for 4 doses.   HYDROPHILIC EX Apply 1 application topically 2 (two) times daily as needed (moiturizer).   levETIRAcetam 500 MG tablet Commonly known as: Keppra Take 1 tablet (500 mg total) by mouth 2 (two) times daily.   potassium chloride SA 20 MEQ tablet Commonly known as: KLOR-CON M Take 1 tablet (20 mEq total) by mouth daily.   vitamin B-12 500 MCG tablet Commonly known as: CYANOCOBALAMIN Take 1,000 mcg by mouth daily.        Contact information for follow-up providers     [facility] Follow up in 1 week(s).   Contact information: [address] [place] [address] [phone]  Contact information for after-discharge care     Destination     [facility]erred SNF .   Service: Skilled Nursing Contact information: [place] [address] [phone]                    Allergies  Allergen Reactions   Penicillins Hives and Swelling   Valproate Sodium     Other reaction(s): Other (See Comments)   Lexapro [Escitalopram] Diarrhea    Consultations: Neurology   Procedures/Studies: CT Head Wo Contrast  Result Date: 03/12/2021 CLINICAL DATA:  Altered mental status. EXAM: CT HEAD WITHOUT CONTRAST TECHNIQUE: Contiguous axial images were obtained from the base of the skull through the vertex without intravenous contrast. RADIATION DOSE REDUCTION: This exam was performed according to the departmental dose-optimization program which includes automated exposure control, adjustment of the mA and/or kV according to patient size and/or use of iterative reconstruction technique. COMPARISON:  Head CT dated 12/10/2020. FINDINGS: Brain: Moderate age-related atrophy and advanced chronic microvascular ischemic changes. There is no acute  intracranial hemorrhage. No mass effect or midline shift. No extra-axial fluid collection. Vascular: No hyperdense vessel or unexpected calcification. Skull: Normal. Negative for fracture or focal lesion. Sinuses/Orbits: No acute finding. Other: None IMPRESSION: 1. No acute intracranial pathology. 2. Moderate age-related atrophy and advanced chronic microvascular ischemic changes. Electronically Signed   By: Anner Crete M.D.   On: 03/12/2021 01:14   CT Abdomen Pelvis W Contrast  Result Date: 03/12/2021 CLINICAL DATA:  Nausea, vomiting and abdominal pain. EXAM: CT ABDOMEN AND PELVIS WITH CONTRAST TECHNIQUE: Multidetector CT imaging of the abdomen and pelvis was performed using the standard protocol following bolus administration of intravenous contrast. RADIATION DOSE REDUCTION: This exam was performed according to the departmental dose-optimization program which includes automated exposure control, adjustment of the mA and/or kV according to patient size and/or use of iterative reconstruction technique. CONTRAST:  179mL OMNIPAQUE IOHEXOL 300 MG/ML  SOLN COMPARISON:  Similar study 12/14/2019. FINDINGS: Lower chest: The heart is slightly enlarged. Coronary arteries are heavily calcified. There is a small pericardial effusion anteriorly not seen previous superior There is increased posterior basal left lower lobe opacity which could be increased atelectasis or consolidation. There are chronic changes in the lower lobes with infrahilar bronchiectasis. Hepatobiliary: No focal liver abnormality is seen. No gallstones, gallbladder wall thickening, or biliary dilatation. Pancreas: Unremarkable pancreas. Spleen: Normal in size and enhancement. Adrenals/Urinary Tract: There is no adrenal mass no focal abnormality in the renal cortex and no urinary stones or obstruction. No bladder thickening is convincingly seen with the bladder wall partially obscured by left hip nailing hardware. Stomach/Bowel: A gastric wall and  unopacified small bowel are unremarkable. The appendix is normal caliber. There is moderate stool retention. Colonic interpositioning increased with the hepatic flexure interposed between the liver and abdominal wall more so than previously. Additionally there is moderate circumferential thickening of the rectum with adjacent stranding not seen previously. Vascular/Lymphatic: Abdominal aorta is tortuous with moderate to heavy calcifications. 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 Reproductive: Enlarged prostate, but not well seen due to the right hip hardware. An estimated 5.2 cm transverse axis. Also , both testicles appear to have retracted into the inguinal canals compared to previous exam. Other: There is trace ascites in the presacral pelvis.  No other free fluid. There is no free air, hemorrhage or abscess. There is no incarcerated hernia. Musculoskeletal: There are advanced degenerative changes in the lumbar spine. 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 There is advanced right hip DJD. IMPRESSION: 1. Increased atelectasis or consolidation in the left lower lobe, background chronic changes with bilateral infrahilar lower lobe bronchiectasis. 2. Increased moderate circumferential rectal thickening compatible with proctitis whether infectious or inflammatory, versus infiltrating disease. Direct visualization recommended after treatment. 3. Both testicles appear retracted into the inguinal canals compared to the prior study. 4. Constipation with diverticulosis and increased colonic interposition of the hepatic flexure anterior to the liver. 5. Stable iliac aneurysms.  6. Prostatomegaly. 7. Degenerative and surgical changes at the hips. 8. Aortic and coronary artery atherosclerosis. 9. Small anterior pericardial fluid also new.  Mild cardiomegaly. Electronically Signed   By: Telford Nab M.D.   On: 03/12/2021 03:17   DG Chest Port 1 View  Result Date: 03/12/2021 CLINICAL DATA:  Seizure, confusion EXAM: PORTABLE CHEST 1 VIEW COMPARISON:  03/09/2021 FINDINGS: Heart and mediastinal contours are within normal limits. No focal opacities or effusions. No acute bony abnormality. IMPRESSION: No active disease. Electronically Signed   By: Rolm Baptise M.D.   On: 03/12/2021 00:19   DG Chest Port 1 View  Result Date: 03/09/2021 CLINICAL DATA:  Pain change in behavior EXAM: PORTABLE CHEST 1 VIEW COMPARISON:  12/10/2020 FINDINGS: No consolidative airspace opacity, pleural effusion or pneumothorax. Minimal strandy atelectasis right base. No pneumothorax. Colon interposition beneath the right diaphragm. Patchy opacity left lung base. IMPRESSION: Patchy opacity left lung base may reflect atelectasis or small focus of pneumonia. Electronically Signed   By: Donavan Foil M.D.   On: 03/09/2021 23:56     Discharge Exam: Vitals:   03/14/21 2355 03/15/21 0750  BP: 129/82 122/86  Pulse: 73 74  Resp: 14 15  Temp:  (!) 97.4 F (36.3 C)  SpO2: 97% 100%   Vitals:   03/14/21 1212 03/14/21 1955 03/14/21 2355 03/15/21 0750  BP: 122/81 113/77 129/82 122/86  Pulse: 70 70 73 74  Resp: 12 12 14 15   Temp: 98.1 F (36.7 C) 98.2 F (36.8 C)  (!) 97.4 F (36.3 C)  TempSrc: Axillary Oral  Oral  SpO2: 95% 99% 97% 100%  Weight:        General: Pt is alert, awake, not in acute distress Cardiovascular: RRR, S1/S2 +, no rubs, no gallops Respiratory: CTA bilaterally, no wheezing, no rhonchi Abdominal: Soft, NT, ND, bowel sounds + Extremities: no edema, no cyanosis, left hemiparesis due to previous stroke    The results of significant diagnostics from this hospitalization  (including imaging, microbiology, ancillary and laboratory) are listed below for reference.     Microbiology: Recent Results (from the past 240 hour(s))  Resp Panel by RT-PCR (Flu A&B, Covid) Nasopharyngeal Swab     Status: None   Collection Time: 03/12/21  4:07 AM   Specimen: Nasopharyngeal Swab; Nasopharyngeal(NP) swabs in vial transport medium  Result Value Ref Range Status   SARS Coronavirus 2 by RT PCR NEGATIVE NEGATIVE Final    Comment: (NOTE) SARS-CoV-2 target nucleic acids are NOT DETECTED.  The SARS-CoV-2 RNA is generally detectable in upper respiratory specimens during the acute phase of infection. The lowest concentration of SARS-CoV-2 viral copies this assay can detect is 138 copies/mL. A negative result does  not preclude SARS-Cov-2 infection and should not be used as the sole basis for treatment or other patient management decisions. A negative result may occur with  improper specimen collection/handling, submission of specimen other than nasopharyngeal swab, presence of viral mutation(s) within the areas targeted by this assay, and inadequate number of viral copies(<138 copies/mL). A negative result must be combined with clinical observations, patient history, and epidemiological information. The expected result is Negative.  Fact Sheet for Patients:  EntrepreneurPulse.com.au  Fact Sheet for Healthcare Providers:  IncredibleEmployment.be  This test is no t yet approved or cleared by the [place] FDA and  has been authorized for detection and/or diagnosis of SARS-CoV-2 by FDA under an Emergency Use Authorization (EUA). This EUA will remain  in effect (meaning this test can be used) for the duration of the COVID-19 declaration under Section 564(b)(1) of the Act, 21 U.S.C.section 360bbb-3(b)(1), unless the authorization is terminated  or revoked sooner.       Influenza A by PCR NEGATIVE NEGATIVE Final   Influenza B by PCR  NEGATIVE NEGATIVE Final    Comment: (NOTE) The Xpert Xpress SARS-CoV-2/FLU/RSV plus assay is intended as an aid in the diagnosis of influenza from Nasopharyngeal swab specimens and should not be used as a sole basis for treatment. Nasal washings and aspirates are unacceptable for Xpert Xpress SARS-CoV-2/FLU/RSV testing.  Fact Sheet for Patients: EntrepreneurPulse.com.au  Fact Sheet for Healthcare Providers: IncredibleEmployment.be  This test is not yet approved or cleared by the [place] FDA and has been authorized for detection and/or diagnosis of SARS-CoV-2 by FDA under an Emergency Use Authorization (EUA). This EUA will remain in effect (meaning this test can be used) for the duration of the COVID-19 declaration under Section 564(b)(1) of the Act, 21 U.S.C. section 360bbb-3(b)(1), unless the authorization is terminated or revoked.  Performed at [facility], [place] [address], [place], [place] [address]      Labs: BNP (last 3 results) No results for input(s): BNP in the last 8760 hours. Basic Metabolic Panel: Recent Labs  Lab 03/12/21 0000 03/12/21 0700 03/13/21 0950 03/13/21 1501 03/14/21 0318 03/15/21 0346  NA 141 137  --  139 140 137  K 2.3* 3.0* 2.5* 2.5* 3.3* 3.2*  CL 102 105  --  103 102 103  CO2 29 27  --  29 28 27   GLUCOSE 118* 87  --  88 76 124*  BUN 12 9  --  <5* <5* 6*  CREATININE 0.50* 0.66  --  0.62 0.54* 0.76  CALCIUM 8.6* 7.9*  --  8.4* 8.2* 8.3*  MG 2.0   1.9 1.8 1.7  --  1.8  --   PHOS  --  1.9* 2.7  --  3.3  --    Liver Function Tests: Recent Labs  Lab 03/09/21 2321 03/12/21 0000 03/12/21 0700  AST 15 21 15   ALT 12 15 13   ALKPHOS 69 68 57  BILITOT 0.8 0.4 0.4  PROT 7.3 6.8 5.8*  ALBUMIN 3.0* 2.7* 2.5*   Recent Labs  Lab 03/12/21 0000  LIPASE 38   No results for input(s): AMMONIA in the last 168 hours. CBC: Recent Labs  Lab 03/09/21 2321 03/12/21 0000 03/12/21 0700  03/14/21 0318 03/15/21 0346  WBC 5.4 7.1 7.4 7.5 7.8  NEUTROABS  --  5.0 4.5 4.7 4.8  HGB 11.8* 11.0* 10.4* 10.0* 9.6*  HCT 37.0* 33.7* 32.9* 30.6* 29.8*  MCV 81.7 81.2 83.1 81.0 81.9  PLT 230 245 214 223 207  Cardiac Enzymes: No results for input(s): CKTOTAL, CKMB, CKMBINDEX, TROPONINI in the last 168 hours. BNP: Invalid input(s): POCBNP CBG: No results for input(s): GLUCAP in the last 168 hours. D-Dimer No results for input(s): DDIMER in the last 72 hours. Hgb A1c No results for input(s): HGBA1C in the last 72 hours. Lipid Profile No results for input(s): CHOL, HDL, LDLCALC, TRIG, CHOLHDL, LDLDIRECT in the last 72 hours. Thyroid function studies No results for input(s): TSH, T4TOTAL, T3FREE, THYROIDAB in the last 72 hours.  Invalid input(s): FREET3 Anemia work up No results for input(s): VITAMINB12, FOLATE, FERRITIN, TIBC, IRON, RETICCTPCT in the last 72 hours. Urinalysis    Component Value Date/Time   COLORURINE YELLOW 03/12/2021 0330   APPEARANCEUR CLEAR 03/12/2021 0330   LABSPEC 1.045 (H) 03/12/2021 0330   PHURINE 6.0 03/12/2021 0330   GLUCOSEU NEGATIVE 03/12/2021 0330   HGBUR NEGATIVE 03/12/2021 0330   BILIRUBINUR NEGATIVE 03/12/2021 0330   KETONESUR NEGATIVE 03/12/2021 0330   PROTEINUR NEGATIVE 03/12/2021 0330   UROBILINOGEN 1.0 02/01/2014 1507   NITRITE NEGATIVE 03/12/2021 0330   LEUKOCYTESUR NEGATIVE 03/12/2021 0330   Sepsis Labs Invalid input(s): PROCALCITONIN,  WBC,  LACTICIDVEN Microbiology Recent Results (from the past 240 hour(s))  Resp Panel by RT-PCR (Flu A&B, Covid) Nasopharyngeal Swab     Status: None   Collection Time: 03/12/21  4:07 AM   Specimen: Nasopharyngeal Swab; Nasopharyngeal(NP) swabs in vial transport medium  Result Value Ref Range Status   SARS Coronavirus 2 by RT PCR NEGATIVE NEGATIVE Final    Comment: (NOTE) SARS-CoV-2 target nucleic acids are NOT DETECTED.  The SARS-CoV-2 RNA is generally detectable in upper  respiratory specimens during the acute phase of infection. The lowest concentration of SARS-CoV-2 viral copies this assay can detect is 138 copies/mL. A negative result does not preclude SARS-Cov-2 infection and should not be used as the sole basis for treatment or other patient management decisions. A negative result may occur with  improper specimen collection/handling, submission of specimen other than nasopharyngeal swab, presence of viral mutation(s) within the areas targeted by this assay, and inadequate number of viral copies(<138 copies/mL). A negative result must be combined with clinical observations, patient history, and epidemiological information. The expected result is Negative.  Fact Sheet for Patients:  EntrepreneurPulse.com.au  Fact Sheet for Healthcare Providers:  IncredibleEmployment.be  This test is no t yet approved or cleared by the [place] FDA and  has been authorized for detection and/or diagnosis of SARS-CoV-2 by FDA under an Emergency Use Authorization (EUA). This EUA will remain  in effect (meaning this test can be used) for the duration of the COVID-19 declaration under Section 564(b)(1) of the Act, 21 U.S.C.section 360bbb-3(b)(1), unless the authorization is terminated  or revoked sooner.       Influenza A by PCR NEGATIVE NEGATIVE Final   Influenza B by PCR NEGATIVE NEGATIVE Final    Comment: (NOTE) The Xpert Xpress SARS-CoV-2/FLU/RSV plus assay is intended as an aid in the diagnosis of influenza from Nasopharyngeal swab specimens and should not be used as a sole basis for treatment. Nasal washings and aspirates are unacceptable for Xpert Xpress SARS-CoV-2/FLU/RSV testing.  Fact Sheet for Patients: EntrepreneurPulse.com.au  Fact Sheet for Healthcare Providers: IncredibleEmployment.be  This test is not yet approved or cleared by the [place] FDA and has been  authorized for detection and/or diagnosis of SARS-CoV-2 by FDA under an Emergency Use Authorization (EUA). This EUA will remain in effect (meaning this test can be used) for the duration of the  COVID-19 declaration under Section 564(b)(1) of the Act, 21 U.S.C. section 360bbb-3(b)(1), unless the authorization is terminated or revoked.  Performed at [facility], [place] [address] [place], [place] [address]      Time coordinating discharge: Over 30 minutes  SIGNED:   Darliss Cheney, MD  [facility] 03/15/2021, 10:56 AM  If 7PM-7AM, please contact night-coverage [email or web address]

## 2021-03-15 NOTE — Progress Notes (Addendum)
Speech Language Pathology Treatment: Dysphagia  Patient Details Name: Johnathan Mitchell. MRN: [id number] DOB: [birth year] Today's Date: 03/15/2021 Time: 7373-6681 SLP Time Calculation (min) (ACUTE ONLY): 8 min  Assessment / Plan / Recommendation Clinical Impression  Pt exhibiting symptoms of GER during session including eructation, re swallows, gurgling sound after swallows. Consumed graham cracker and thin with straw sitting in upright position. After trial noted the above suspicious for esophageal involvement and educated pt on strategies to reduce. He denied history of GER or symptoms. He consumed adequate size sips. Recommend he continue with Dys 2 although mastication was functional, thin liquids and stay upright following meals. Pt plans for transfer to SNF today. D/C ST in acute care.    HPI HPI: Pt is an 87 y.o. male who was previously evaluated at [facility] ED on 2/10, diagnosed with CAP, and discharged home on oral doxycycline, but then returned to [facility] ED on 2/12 due to report of declined oral intake and acute generalized weakness. Pt had seizure-like activity in the ED and was transferred to [facility]. SLP  consulted due to "Frequent coughing on bacon and french toast". CT abdomen 2/13: Increased atelectasis or consolidation in the left lower lobe, background chronic changes with bilateral infrahilar lower lobe bronchiectasis. CT head negative for acute changes. PMH: dementia, seizure disorder, essential hypertension.      SLP Plan  All goals met;Discharge SLP treatment due to (comment)      Recommendations for follow up therapy are one component of a multi-disciplinary discharge planning process, led by the attending physician.  Recommendations may be updated based on patient status, additional functional criteria and insurance authorization.    Recommendations  Diet recommendations: Dysphagia 2 (fine chop);Thin liquid Liquids provided via: Cup;Straw Medication Administration: Crushed with  puree Supervision: Patient able to self feed;Full supervision/cueing for compensatory strategies Compensations: Slow rate;Small sips/bites Postural Changes and/or Swallow Maneuvers: Seated upright 90 degrees;Upright 30-60 min after meal                Oral Care Recommendations: Oral care BID Follow Up Recommendations: Skilled nursing-short term rehab (<3 hours/day) Assistance recommended at discharge: Frequent or constant Supervision/Assistance SLP Visit Diagnosis: Dysphagia, unspecified (R13.10) Plan: All goals met;Discharge SLP treatment due to (comment)           Houston Siren  03/15/2021, 12:10 PM

## 2021-03-15 NOTE — Plan of Care (Signed)

## 2021-03-15 NOTE — TOC Transition Note (Addendum)
Transition of Care [facility]) - CM/SW Discharge Note   Patient Details  Name: Johnathan Mitchell. MRN: [id number] Date of Birth: 22-Feb-1934  Transition of Care [facility]) CM/SW Contact:  Lorri Frederick, LCSW Phone Number: 03/15/2021, 10:41 AM   Clinical Narrative:   Pt discharging to [facility], RN call [phone] for report.  PTAR called [phone].   2/17 0815: CSW had message from [facility], [phone]. saying they were going to follow pt and were unsure where he had DC to for SNF.  CSW spoke with pt daughter Johnathan Mitchell, who confirmed that she did request hospice and was connected by the other CSW, permission given to share pt location at [facility].  CSW spoke with Marylene Land and informed her of DC to [facility].    Final next level of care: Skilled Nursing Facility Barriers to Discharge: Barriers Resolved   Patient Goals and CMS Choice Patient states their goals for this hospitalization and ongoing recovery are:: Pt unable to participate in goal setting at this time. CMS Medicare.gov Compare Post Acute Care list provided to:: Patient Represenative (must comment) Choice offered to / list presented to : Adult Children  Discharge Placement              Patient chooses bed at:  [facility]) Patient to be transferred to facility by: PTAR Name of family member notified: daughter Johnathan Mitchell Patient and family notified of of transfer: 03/15/21  Discharge Plan and Services     Post Acute Care Choice: Skilled Nursing Facility                               Social Determinants of Health (SDOH) Interventions     Readmission Risk Interventions No flowsheet data found.

## 2021-03-15 NOTE — Progress Notes (Signed)
Physical Therapy Treatment Patient Details Name: Johnathan Mitchell. MRN: [id number] DOB: Jul 02, 1934 Today's Date: 03/15/2021   History of Present Illness Pt is an 87 y/o M presenting to [facility] ED on 2/12 for seizure like activity, transferred to [facility] for further evaluation. 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    PT Comments    Pt received in supine, noted to be in bed saturated by urine but unaware, pt oriented x1 and participatory with encouragement as able. Pt able to roll L/R multiple reps for hygiene assist with mod to maxA, needing increased assist to roll to R due to LUE weakness, pt also reporting LUE tenderness/pain with any palpation to forearm/hand.  Pt noted to have multiple areas of skin breakdown on posterior scrotum and peri-areas, RN aware and in room to assist with changing out wet bed sheets and self-care tasks. Pt following simple 1-step commands for UE/LE exercises, attempted long sit however pt c/o significant pain and fatigue and unable to tolerate full upright posture. Pt continues to benefit from PT services to progress toward functional mobility goals.   Recommendations for follow up therapy are one component of a multi-disciplinary discharge planning process, led by the attending physician.  Recommendations may be updated based on patient status, additional functional criteria and insurance authorization.  Follow Up Recommendations  Skilled nursing-short term rehab (<3 hours/day)     Assistance Recommended at Discharge Frequent or constant Supervision/Assistance  Patient can return home with the following Two people to help with walking and/or transfers;A lot of help with bathing/dressing/bathroom;Assistance with cooking/housework;Direct supervision/assist for medications management;Assist for transportation;Help with stairs or ramp for entrance   Equipment Recommendations  None recommended by PT    Recommendations  for Other Services       Precautions / Restrictions Precautions Precautions: Fall Precaution Comments: LUE/LLE paresis Restrictions Weight Bearing Restrictions: No     Mobility  Bed Mobility Overal bed mobility: Needs Assistance Bed Mobility: Rolling Rolling: Max assist, Mod assist         General bed mobility comments: maxA to roll to L, modA to roll to R, verbal/tactile cues and increased time for pt assist with technique    Transfers          General transfer comment: deferred, pt fatigued and in too much pain after rolling x4 reps for clean-up     Balance Overall balance assessment: Needs assistance Sitting-balance support: Single extremity supported, Feet supported Sitting balance-Leahy Scale: Zero Sitting balance - Comments: partial trunk lift in long sit, pt with heavy L lean needs pillow under hip to maintain neutral posture with bed in chair position             Cognition Arousal/Alertness: Awake/alert Behavior During Therapy: Flat affect Overall Cognitive Status: Impaired/Different from baseline Area of Impairment: Orientation, Safety/judgement, Following commands      Orientation Level: Time, Situation     Following Commands: Follows one step commands with increased time Safety/Judgement: Decreased awareness of safety, Decreased awareness of deficits     General Comments: Able to respond to simple questions, however answers questionable and pt contradicts himself when speaking about family (whether he has children and what their names are).        Exercises General Exercises - Upper Extremity Shoulder Flexion: AROM, Right, Supine, Left, 5 reps ((partial ROM on LUE)) General Exercises - Lower Extremity Short Arc Quad: AROM, Both, 10 reps, Supine, Sidelying Heel Slides: AAROM, Both, 10 reps, Supine  Hip ABduction/ADduction: AAROM, Both (x3 reps, pain limiting) Straight Leg Raises: AAROM, Both, 10 reps, Supine    General Comments General  comments (skin integrity, edema, etc.): VSS on RA, no acute s/sx distress      Pertinent Vitals/Pain Pain Assessment Pain Assessment: Faces Faces Pain Scale: Hurts even more Pain Location: during peri-care Pain Descriptors / Indicators: Discomfort, Moaning Pain Intervention(s): Limited activity within patient's tolerance, Monitored during session, Repositioned           PT Goals (current goals can now be found in the care plan section) Acute Rehab PT Goals Patient Stated Goal: unable PT Goal Formulation: Patient unable to participate in goal setting Time For Goal Achievement: 03/26/21 Progress towards PT goals: Progressing toward goals    Frequency    Min 2X/week      PT Plan Current plan remains appropriate       AM-PAC PT "6 Clicks" Mobility   Outcome Measure  Help needed turning from your back to your side while in a flat bed without using bedrails?: A Lot Help needed moving from lying on your back to sitting on the side of a flat bed without using bedrails?: Total Help needed moving to and from a bed to a chair (including a wheelchair)?: Total Help needed standing up from a chair using your arms (e.g., wheelchair or bedside chair)?: Total Help needed to walk in hospital room?: Total Help needed climbing 3-5 steps with a railing? : Total 6 Click Score: 7    End of Session Equipment Utilized During Treatment: Other (comment) (bed pad assist) Activity Tolerance: Patient limited by fatigue;Patient limited by pain Patient left: in bed;with call bell/phone within reach;with bed alarm set;with nursing/sitter in room (pt heels floated) Nurse Communication: Mobility status;Other (comment) (needs new condom cath placed, skin breakdown on posterior scrotum and peri area) PT Visit Diagnosis: Muscle weakness (generalized) (M62.81);Other symptoms and signs involving the nervous system (R29.898)     Time: 5638-7564 PT Time Calculation (min) (ACUTE ONLY): 18 min  Charges:   $Therapeutic Exercise: 8-22 mins                     Topacio Cella P., PTA Acute Rehabilitation Services Pager: [phone] Office: [phone]    Angus [place]ms 03/15/2021, 1:47 PM

## 2021-03-15 NOTE — Progress Notes (Signed)
Report called and gave to nurse at [facility].

## 2021-05-08 ENCOUNTER — Emergency Department (HOSPITAL_COMMUNITY)
Admission: EM | Admit: 2021-05-08 | Discharge: 2021-05-08 | Disposition: A | Discharge status: Skilled Nursing Facility | Payer: Medicare Other | Attending: Emergency Medicine | Admitting: Emergency Medicine

## 2021-05-08 ENCOUNTER — Other Ambulatory Visit: Payer: Self-pay

## 2021-05-08 ENCOUNTER — Encounter (HOSPITAL_COMMUNITY): Payer: Self-pay

## 2021-05-08 DIAGNOSIS — I10 Essential (primary) hypertension: Secondary | ICD-10-CM | POA: Insufficient documentation

## 2021-05-08 DIAGNOSIS — W06XXXA Fall from bed, initial encounter: Secondary | ICD-10-CM | POA: Diagnosis not present

## 2021-05-08 DIAGNOSIS — J45909 Unspecified asthma, uncomplicated: Secondary | ICD-10-CM | POA: Diagnosis not present

## 2021-05-08 DIAGNOSIS — F0390 Unspecified dementia without behavioral disturbance: Secondary | ICD-10-CM | POA: Insufficient documentation

## 2021-05-08 DIAGNOSIS — Z87891 Personal history of nicotine dependence: Secondary | ICD-10-CM | POA: Diagnosis not present

## 2021-05-08 DIAGNOSIS — Z7982 Long term (current) use of aspirin: Secondary | ICD-10-CM | POA: Insufficient documentation

## 2021-05-08 DIAGNOSIS — Z043 Encounter for examination and observation following other accident: Secondary | ICD-10-CM | POA: Diagnosis present

## 2021-05-08 DIAGNOSIS — Z79899 Other long term (current) drug therapy: Secondary | ICD-10-CM | POA: Insufficient documentation

## 2021-05-08 NOTE — ED Notes (Signed)
[facility] called for transport.  

## 2021-05-08 NOTE — ED Triage Notes (Signed)
Patient BIB [facility] from [facility]. Unwitnessed fall. Patient "rolled out" of bed, staff found him on the ground. No blood thinners. Pain in lower back. Said he didn't hit his head, but alert and oriented x2 at baseline. Staff could not tell how long he has been on the floor.  ?

## 2021-05-08 NOTE — ED Provider Notes (Signed)
? ?[facility] DEPT ?Provider Note: Georgena Spurling, MD, [place] ? ?CSN: [id number] ?MRN: [id number] ?ARRIVAL: 05/08/21 at 0227 ?ROOM: WA19/WA19 ? ? ?CHIEF COMPLAINT  ?Fall ? ?Level 5 caveat: Dementia ?HISTORY OF PRESENT ILLNESS  ?05/08/21 2:41 AM ?Johnathan Mitchell. is a 87 y.o. male who was found on the floor of his nursing home  after falling out of bed.  He states he remembers rolling out of bed.  It is unknown how long he was on the floor.  He is not on anticoagulation.  He did not hit his head.  He denies any pain or injury.  He has contractures of the left hand and wrist which he states is due to an old fracture although records indicate this is residual paresis from a stroke.  He has at his baseline mental status per staff. ? ? ?Past Medical History:  ?Diagnosis Date  ? Asthma   ? Dementia ([place])   ? Gout   ? Hypertension   ? Seizures ([place])   ? Stroke [facility])   ? ? ?Past Surgical History:  ?Procedure Laterality Date  ? BACK SURGERY    ? FEMUR IM NAIL Left 05/13/2017  ? Procedure: INTRAMEDULLARY (IM) NAIL FEMORAL;  Surgeon: Altamese Country Walk, MD;  Location: [facility];  Service: Orthopedics;  Laterality: Left;  ? KNEE SURGERY    ? ? ?Family History  ?Problem Relation Age of Onset  ? Hyperlipidemia Mother   ? Hypertension Mother   ? ? ?Social History  ? ?Tobacco Use  ? Smoking status: Former  ? Smokeless tobacco: Never  ?Vaping Use  ? Vaping Use: Never used  ?Substance Use Topics  ? Alcohol use: No  ? Drug use: No  ? ? ?Prior to Admission medications   ?Medication Sig Start Date End Date Taking? Authorizing Provider  ?acetaminophen (TYLENOL) 500 MG tablet Take 1 tablet (500 mg total) by mouth every 8 (eight) hours. ?Patient taking differently: Take 500-1,000 mg by mouth 3 (three) times daily as needed for mild pain, moderate pain, headache or fever. 12/20/19   Mercy Riding, MD  ?aspirin EC 81 MG tablet Take 1 tablet (81 mg total) by mouth daily. Swallow whole. 03/15/21 03/15/22  Darliss Cheney, MD  ?atorvastatin (LIPITOR) 80 MG  tablet Take 1 tablet (80 mg total) by mouth daily at 6 PM. ?Patient not taking: Reported on 03/12/2021 08/20/17   Medina-Vargas, Monina C, NP  ?busPIRone (BUSPAR) 5 MG tablet Take 2.5 mg by mouth 4 (four) times daily as needed (anxiety).    [provider]  ?Cholecalciferol 25 MCG (1000 UT) capsule Take 1,000 Units by mouth daily. ?Patient not taking: Reported on 03/12/2021    [provider]  ?diclofenac Sodium (VOLTAREN) 1 % GEL Apply 2 g topically 4 (four) times daily as needed for pain. ?Patient not taking: Reported on 03/12/2021 08/16/20   [provider]  ?HYDROPHILIC EX Apply 1 application topically 2 (two) times daily as needed (moiturizer). ?Patient not taking: Reported on 03/12/2021    [provider]  ?levETIRAcetam (KEPPRA) 500 MG tablet Take 1 tablet (500 mg total) by mouth 2 (two) times daily. 08/08/20   Lacretia Leigh, MD  ?potassium chloride SA (KLOR-CON M) 20 MEQ tablet Take 1 tablet (20 mEq total) by mouth daily. 03/15/21 04/14/21  Darliss Cheney, MD  ?vitamin B-12 (CYANOCOBALAMIN) 500 MCG tablet Take 1,000 mcg by mouth daily. ?Patient not taking: Reported on 03/12/2021 11/02/20   [provider]  ? ? ?Allergies ?Penicillins, Valproate sodium, and  Lexapro [escitalopram] ? ? ?REVIEW OF SYSTEMS  ?Level 5 caveat: Dementia ? ? ?PHYSICAL EXAMINATION  ?Initial Vital Signs ?Blood pressure (!) 124/92, pulse 76, temperature 98 ?F (36.7 ?C), temperature source Oral, resp. rate 19, height 5\' 6"  (1.676 m), weight 56 kg, SpO2 96 %. ? ?Examination ?General: Well-developed, well-nourished male in no acute distress; appearance consistent with age of record ?HENT: normocephalic; atraumatic ?Eyes: pupils equal, round and reactive to light; extraocular muscles grossly intact ?Neck: supple; nontender ?Heart: regular rate and rhythm ?Lungs: clear to auscultation bilaterally ?Abdomen: soft; nondistended; nontender; bowel sounds present ?Back: Nontender; no pain on movement of  back ?Extremities: Chronic appearing contractures of left hand and wrist with limited range of motion ?Neurologic: Awake, alert and oriented to person; left hemiparesis most prominent in the left upper extremity ?Skin: Warm and dry ?Psychiatric: Normal mood and affect ? ? ?RESULTS  ?Summary of this visit's results, reviewed and interpreted by myself: ? ? EKG Interpretation ? ?Date/Time:    ?Ventricular Rate:    ?PR Interval:    ?QRS Duration:   ?QT Interval:    ?QTC Calculation:   ?R Axis:     ?Text Interpretation:   ?  ? ?  ? ?Laboratory Studies: ?No results found for this or any previous visit (from the past 24 hour(s)). ?Imaging Studies: ?No results found. ? ?ED COURSE and MDM  ?Nursing notes, initial and subsequent vitals signs, including pulse oximetry, reviewed and interpreted by myself. ? ?Vitals:  ? 05/08/21 0232 05/08/21 0234  ?BP:  (!) 124/92  ?Pulse:  76  ?Resp:  19  ?Temp:  98 ?F (36.7 ?C)  ?TempSrc:  Oral  ?SpO2:  96%  ?Weight: 56 kg   ?Height: 5\' 6"  (1.676 m)   ? ?Medications - No data to display ? ?No evidence of significant injury on exam.  I do not believe any radiographs are indicated at this time. ? ?PROCEDURES  ?Procedures ? ? ?ED DIAGNOSES  ? ?  ICD-10-CM   ?1. Fall from bed, initial encounter  [email or web address].Merril Abbe   ?  ? ? ? ?  ?Shanon Rosser, MD ?05/08/21 [id number] ? ?

## 2021-05-16 ENCOUNTER — Encounter (HOSPITAL_COMMUNITY): Payer: Self-pay

## 2021-05-16 ENCOUNTER — Other Ambulatory Visit: Payer: Self-pay

## 2021-05-16 ENCOUNTER — Inpatient Hospital Stay (HOSPITAL_COMMUNITY)
Admission: EM | Admit: 2021-05-16 | Discharge: 2021-05-25 | DRG: 178 | Disposition: A | Discharge status: Skilled Nursing Facility | Payer: No Typology Code available for payment source | Source: Skilled Nursing Facility | Attending: Family Medicine | Admitting: Family Medicine

## 2021-05-16 ENCOUNTER — Emergency Department (HOSPITAL_COMMUNITY): Payer: No Typology Code available for payment source

## 2021-05-16 DIAGNOSIS — M109 Gout, unspecified: Secondary | ICD-10-CM | POA: Diagnosis present

## 2021-05-16 DIAGNOSIS — E876 Hypokalemia: Secondary | ICD-10-CM | POA: Diagnosis present

## 2021-05-16 DIAGNOSIS — Z880 Allergy status to penicillin: Secondary | ICD-10-CM

## 2021-05-16 DIAGNOSIS — Z8673 Personal history of transient ischemic attack (TIA), and cerebral infarction without residual deficits: Secondary | ICD-10-CM | POA: Diagnosis not present

## 2021-05-16 DIAGNOSIS — Z83438 Family history of other disorder of lipoprotein metabolism and other lipidemia: Secondary | ICD-10-CM

## 2021-05-16 DIAGNOSIS — R569 Unspecified convulsions: Secondary | ICD-10-CM | POA: Diagnosis present

## 2021-05-16 DIAGNOSIS — J45909 Unspecified asthma, uncomplicated: Secondary | ICD-10-CM | POA: Diagnosis present

## 2021-05-16 DIAGNOSIS — K649 Unspecified hemorrhoids: Secondary | ICD-10-CM | POA: Diagnosis present

## 2021-05-16 DIAGNOSIS — N179 Acute kidney failure, unspecified: Secondary | ICD-10-CM | POA: Diagnosis present

## 2021-05-16 DIAGNOSIS — F419 Anxiety disorder, unspecified: Secondary | ICD-10-CM | POA: Diagnosis present

## 2021-05-16 DIAGNOSIS — J690 Pneumonitis due to inhalation of food and vomit: Secondary | ICD-10-CM | POA: Diagnosis present

## 2021-05-16 DIAGNOSIS — I10 Essential (primary) hypertension: Secondary | ICD-10-CM | POA: Diagnosis present

## 2021-05-16 DIAGNOSIS — Z79899 Other long term (current) drug therapy: Secondary | ICD-10-CM

## 2021-05-16 DIAGNOSIS — Z7982 Long term (current) use of aspirin: Secondary | ICD-10-CM

## 2021-05-16 DIAGNOSIS — Z888 Allergy status to other drugs, medicaments and biological substances status: Secondary | ICD-10-CM | POA: Diagnosis not present

## 2021-05-16 DIAGNOSIS — I639 Cerebral infarction, unspecified: Secondary | ICD-10-CM | POA: Diagnosis present

## 2021-05-16 DIAGNOSIS — Z7401 Bed confinement status: Secondary | ICD-10-CM | POA: Diagnosis not present

## 2021-05-16 DIAGNOSIS — Z87891 Personal history of nicotine dependence: Secondary | ICD-10-CM

## 2021-05-16 DIAGNOSIS — F03918 Unspecified dementia, unspecified severity, with other behavioral disturbance: Secondary | ICD-10-CM | POA: Diagnosis present

## 2021-05-16 DIAGNOSIS — Z20822 Contact with and (suspected) exposure to covid-19: Secondary | ICD-10-CM | POA: Diagnosis present

## 2021-05-16 DIAGNOSIS — A419 Sepsis, unspecified organism: Secondary | ICD-10-CM

## 2021-05-16 DIAGNOSIS — L89310 Pressure ulcer of right buttock, unstageable: Secondary | ICD-10-CM | POA: Diagnosis present

## 2021-05-16 DIAGNOSIS — Z7189 Other specified counseling: Secondary | ICD-10-CM | POA: Diagnosis not present

## 2021-05-16 DIAGNOSIS — J189 Pneumonia, unspecified organism: Secondary | ICD-10-CM | POA: Diagnosis not present

## 2021-05-16 DIAGNOSIS — Z515 Encounter for palliative care: Secondary | ICD-10-CM

## 2021-05-16 DIAGNOSIS — G40909 Epilepsy, unspecified, not intractable, without status epilepticus: Secondary | ICD-10-CM

## 2021-05-16 DIAGNOSIS — Z8249 Family history of ischemic heart disease and other diseases of the circulatory system: Secondary | ICD-10-CM | POA: Diagnosis not present

## 2021-05-16 DIAGNOSIS — I82422 Acute embolism and thrombosis of left iliac vein: Secondary | ICD-10-CM | POA: Diagnosis not present

## 2021-05-16 DIAGNOSIS — R0902 Hypoxemia: Secondary | ICD-10-CM | POA: Diagnosis present

## 2021-05-16 DIAGNOSIS — R531 Weakness: Secondary | ICD-10-CM | POA: Diagnosis not present

## 2021-05-16 LAB — PROTIME-INR
INR: 1 (ref 0.8–1.2)
Prothrombin Time: 12.9 seconds (ref 11.4–15.2)

## 2021-05-16 LAB — BLOOD GAS, VENOUS
Acid-Base Excess: 0 mmol/L (ref 0.0–2.0)
Bicarbonate: 27.2 mmol/L (ref 20.0–28.0)
O2 Saturation: 31.1 %
Patient temperature: 37
pCO2, Ven: 54 mmHg (ref 44–60)
pH, Ven: 7.31 (ref 7.25–7.43)
pO2, Ven: <31 mmHg — CL (ref 32–45)

## 2021-05-16 LAB — CBC WITH DIFFERENTIAL/PLATELET
Abs Immature Granulocytes: 0.01 10*3/uL (ref 0.00–0.07)
Basophils Absolute: 0 10*3/uL (ref 0.0–0.1)
Basophils Relative: 0 %
Eosinophils Absolute: 0 10*3/uL (ref 0.0–0.5)
Eosinophils Relative: 0 %
HCT: 42.7 % (ref 39.0–52.0)
Hemoglobin: 13.6 g/dL (ref 13.0–17.0)
Immature Granulocytes: 0 %
Lymphocytes Relative: 12 %
Lymphs Abs: 0.6 10*3/uL — ABNORMAL LOW (ref 0.7–4.0)
MCH: 27.4 pg (ref 26.0–34.0)
MCHC: 31.9 g/dL (ref 30.0–36.0)
MCV: 85.9 fL (ref 80.0–100.0)
Monocytes Absolute: 0.5 10*3/uL (ref 0.1–1.0)
Monocytes Relative: 10 %
Neutro Abs: 4 10*3/uL (ref 1.7–7.7)
Neutrophils Relative %: 78 %
Platelets: 185 10*3/uL (ref 150–400)
RBC: 4.97 MIL/uL (ref 4.22–5.81)
RDW: 18.4 % — ABNORMAL HIGH (ref 11.5–15.5)
WBC Morphology: INCREASED
WBC: 5.1 10*3/uL (ref 4.0–10.5)
nRBC: 0 % (ref 0.0–0.2)

## 2021-05-16 LAB — CBC
HCT: 37.6 % — ABNORMAL LOW (ref 39.0–52.0)
Hemoglobin: 11.9 g/dL — ABNORMAL LOW (ref 13.0–17.0)
MCH: 27.5 pg (ref 26.0–34.0)
MCHC: 31.6 g/dL (ref 30.0–36.0)
MCV: 86.8 fL (ref 80.0–100.0)
Platelets: 160 10*3/uL (ref 150–400)
RBC: 4.33 MIL/uL (ref 4.22–5.81)
RDW: 18 % — ABNORMAL HIGH (ref 11.5–15.5)
WBC: 5.5 10*3/uL (ref 4.0–10.5)
nRBC: 0 % (ref 0.0–0.2)

## 2021-05-16 LAB — LACTIC ACID, PLASMA: Lactic Acid, Venous: 4 mmol/L (ref 0.5–1.9)

## 2021-05-16 LAB — APTT: aPTT: 30 seconds (ref 24–36)

## 2021-05-16 LAB — COMPREHENSIVE METABOLIC PANEL
ALT: 26 U/L (ref 0–44)
AST: 26 U/L (ref 15–41)
Albumin: 3.2 g/dL — ABNORMAL LOW (ref 3.5–5.0)
Alkaline Phosphatase: 64 U/L (ref 38–126)
Anion gap: 10 (ref 5–15)
BUN: 25 mg/dL — ABNORMAL HIGH (ref 8–23)
CO2: 26 mmol/L (ref 22–32)
Calcium: 9.2 mg/dL (ref 8.9–10.3)
Chloride: 101 mmol/L (ref 98–111)
Creatinine, Ser: 1.58 mg/dL — ABNORMAL HIGH (ref 0.61–1.24)
GFR, Estimated: 43 mL/min — ABNORMAL LOW (ref >60)
Glucose, Bld: 122 mg/dL — ABNORMAL HIGH (ref 70–99)
Potassium: 4 mmol/L (ref 3.5–5.1)
Sodium: 137 mmol/L (ref 135–145)
Total Bilirubin: 1.1 mg/dL (ref 0.3–1.2)
Total Protein: 7 g/dL (ref 6.5–8.1)

## 2021-05-16 LAB — RESP PANEL BY RT-PCR (FLU A&B, COVID) ARPGX2
Influenza A by PCR: NEGATIVE
Influenza B by PCR: NEGATIVE
SARS Coronavirus 2 by RT PCR: NEGATIVE

## 2021-05-16 LAB — CREATININE, SERUM
Creatinine, Ser: 1.49 mg/dL — ABNORMAL HIGH (ref 0.61–1.24)
GFR, Estimated: 46 mL/min — ABNORMAL LOW (ref >60)

## 2021-05-16 LAB — MAGNESIUM: Magnesium: 1.8 mg/dL (ref 1.7–2.4)

## 2021-05-16 MED ORDER — SODIUM CHLORIDE 0.9 % IV SOLN: INTRAVENOUS | Status: DC

## 2021-05-16 MED ORDER — LACTATED RINGERS IV SOLN: INTRAVENOUS | Status: DC

## 2021-05-16 MED ORDER — LACTATED RINGERS IV BOLUS (SEPSIS)
1000.0000 mL | Freq: Once | INTRAVENOUS | Status: AC
  Administered 2021-05-16: 1000 mL via INTRAVENOUS

## 2021-05-16 MED ORDER — SODIUM CHLORIDE 0.9 % IV SOLN
500.0000 mg | INTRAVENOUS | Status: AC
  Administered 2021-05-16 – 2021-05-20 (×5): 500 mg via INTRAVENOUS
  Filled 2021-05-16 (×5): qty 5

## 2021-05-16 MED ORDER — ACETAMINOPHEN 650 MG RE SUPP
650.0000 mg | Freq: Four times a day (QID) | RECTAL | Status: DC | PRN
Start: 2021-05-16 — End: 2021-05-25

## 2021-05-16 MED ORDER — ACETAMINOPHEN 325 MG PO TABS: 650.0000 mg | ORAL_TABLET | Freq: Four times a day (QID) | ORAL | Status: DC | PRN

## 2021-05-16 MED ORDER — SODIUM CHLORIDE 0.9 % IV SOLN
2.0000 g | INTRAVENOUS | Status: AC
  Administered 2021-05-16 – 2021-05-20 (×5): 2 g via INTRAVENOUS
  Filled 2021-05-16 (×5): qty 20

## 2021-05-16 MED ORDER — VANCOMYCIN HCL IN DEXTROSE 1-5 GM/200ML-% IV SOLN
1000.0000 mg | Freq: Once | INTRAVENOUS | Status: AC
  Administered 2021-05-16: 1000 mg via INTRAVENOUS
  Filled 2021-05-16: qty 200

## 2021-05-16 MED ORDER — LACTATED RINGERS IV BOLUS
1000.0000 mL | Freq: Once | INTRAVENOUS | Status: AC
  Administered 2021-05-16: 1000 mL via INTRAVENOUS

## 2021-05-16 MED ORDER — ENOXAPARIN SODIUM 30 MG/0.3ML IJ SOSY
30.0000 mg | PREFILLED_SYRINGE | INTRAMUSCULAR | Status: DC
  Administered 2021-05-16: 30 mg via SUBCUTANEOUS
  Filled 2021-05-16: qty 0.3

## 2021-05-16 MED ORDER — SODIUM CHLORIDE 0.9 % IV SOLN
2.0000 g | Freq: Once | INTRAVENOUS | Status: AC
  Administered 2021-05-16: 2 g via INTRAVENOUS
  Filled 2021-05-16: qty 12.5

## 2021-05-16 MED ORDER — LEVETIRACETAM IN NACL 500 MG/100ML IV SOLN
500.0000 mg | Freq: Two times a day (BID) | INTRAVENOUS | Status: DC
  Administered 2021-05-16 – 2021-05-25 (×18): 500 mg via INTRAVENOUS
  Filled 2021-05-16 (×19): qty 100

## 2021-05-16 NOTE — Assessment & Plan Note (Addendum)
-  seems more conversant and awake ?-Cont to monitor for now ?-Have consulted PT/OT, recs were noted for SNF ?

## 2021-05-16 NOTE — Assessment & Plan Note (Addendum)
-  BP stable at this time ?-Cont on PRN hydralazine as needed ?

## 2021-05-16 NOTE — Progress Notes (Signed)
A consult was received from an ED physician for vancomycin and cefepime per pharmacy dosing.  The patient's profile has been reviewed for ht/wt/allergies/indication/available labs.   ?A one time order has been placed for vancomycin 1g and cefepime 2g.  Further antibiotics/pharmacy consults should be ordered by admitting physician if indicated.       ?                ?Thank you, ?Loralee Pacas, PharmD, BCPS ?05/16/2021  3:17 PM ? ?

## 2021-05-16 NOTE — Progress Notes (Signed)
?   05/16/21 1800  ?SLP Visit Information  ?SLP Received On 05/16/21  ? ?Order for swallow evaluation received.  Given Mr Antenucci with recurrent asp pna, has h/o CVA - recommend proceed to [facility] tomorrow in lieu of BSE if he is medically stable and alert.  MD approved plan. Will place order for MBS 4/20 in hopes pt will be able to participate.  ? ?Rolena Infante, MS [facility] SLP ?Acute Rehab Services ?Office [phone] ?Pager [phone] ? ?

## 2021-05-16 NOTE — ED Notes (Signed)
Pt O2sat 78% on 6LNC pt placed on Non re breather, respiratory called ?

## 2021-05-16 NOTE — ED Notes (Signed)
Family updated as to patient's status. Spoke to [place], pt daughter. ?

## 2021-05-16 NOTE — Assessment & Plan Note (Addendum)
-  Presenting Cr of 1.58, baseline Cr <1 ?-Cr normalized with IVF ?-recheck bmet in AM ?

## 2021-05-16 NOTE — ED Notes (Signed)
Pt was placed on a purwick to obtain a urine sample. ?

## 2021-05-16 NOTE — Assessment & Plan Note (Signed)
Seems stable at this time °

## 2021-05-16 NOTE — H&P (Signed)
?History and Physical  ? ? ?Patient: Johnathan Mitchell. ZDG:[id number] DOB: Aug 22, 1934 ?DOA: 05/16/2021 ?DOS: the patient was seen and examined on 05/16/2021 ?[facility]  ?Patient coming from: Home ? ?Chief Complaint:  ?Chief Complaint  ?Patient presents with  ? Shortness of Breath  ? ?HPI: Johnathan Mitchell. is a 87 y.o. male with medical history significant of aspiration PNA, HTN, seizures, asthma, prior CVA who presents from facility with concerns of recurrent aspiration PNA. Pt is currently unable to provide his own history. Per report, pt was recently admitted for PNA and discharged to SNF. Pt noted to be acutely hypoxemic with O2 sats down to 78% on RA, requiring supplemental O2. On presentation to ED, pt had CXR performed which demonstrated hazy opacities in the B lung bases, reviewed. Pt was given dose of rocephin and hospitalist consulted for consideration for admission ? ?Review of Systems: unable to review all systems due to the inability of the patient to answer questions. ?Past Medical History:  ?Diagnosis Date  ? Asthma   ? Dementia (HCC)   ? Gout   ? Hypertension   ? Seizures (HCC)   ? Stroke [facility])   ? ?Past Surgical History:  ?Procedure Laterality Date  ? BACK SURGERY    ? FEMUR IM NAIL Left 05/13/2017  ? Procedure: INTRAMEDULLARY (IM) NAIL FEMORAL;  Surgeon: [name], MD;  Location: [facility] OR;  Service: Orthopedics;  Laterality: Left;  ? KNEE SURGERY    ? ?Social History:  reports that he has quit smoking. He has never used smokeless tobacco. He reports that he does not drink alcohol and does not use drugs. ? ?Allergies  ?Allergen Reactions  ? Penicillins Hives and Swelling  ? Valproate Sodium   ?  Other reaction(s): Other (See Comments)  ? Lexapro [Escitalopram] Diarrhea  ? ? ?Family History  ?Problem Relation Age of Onset  ? Hyperlipidemia Mother   ? Hypertension Mother   ? ? ?Prior to Admission medications   ?Medication Sig Start Date End Date Taking? Authorizing Provider   ?acetaminophen (TYLENOL) 500 MG tablet Take 1 tablet (500 mg total) by mouth every 8 (eight) hours. ?Patient taking differently: Take 500-1,000 mg by mouth 3 (three) times daily as needed for mild pain, moderate pain, headache or fever. 12/20/19   Almon Hercules, MD  ?aspirin EC 81 MG tablet Take 1 tablet (81 mg total) by mouth daily. Swallow whole. 03/15/21 03/15/22  Hughie Closs, MD  ?atorvastatin (LIPITOR) 80 MG tablet Take 1 tablet (80 mg total) by mouth daily at 6 PM. ?Patient not taking: Reported on 03/12/2021 08/20/17   Medina-Vargas, Monina C, NP  ?busPIRone (BUSPAR) 5 MG tablet Take 2.5 mg by mouth 4 (four) times daily as needed (anxiety).    [provider]  ?Cholecalciferol 25 MCG (1000 UT) capsule Take 1,000 Units by mouth daily. ?Patient not taking: Reported on 03/12/2021    [provider]  ?diclofenac Sodium (VOLTAREN) 1 % GEL Apply 2 g topically 4 (four) times daily as needed for pain. ?Patient not taking: Reported on 03/12/2021 08/16/20   [provider]  ?HYDROPHILIC EX Apply 1 application topically 2 (two) times daily as needed (moiturizer). ?Patient not taking: Reported on 03/12/2021    [provider]  ?levETIRAcetam (KEPPRA) 500 MG tablet Take 1 tablet (500 mg total) by mouth 2 (two) times daily. 08/08/20   Lorre Nick, MD  ?potassium chloride SA (KLOR-CON M) 20 MEQ tablet Take 1 tablet (20 mEq total) by mouth  daily. 03/15/21 04/14/21  Hughie Closs, MD  ?vitamin B-12 (CYANOCOBALAMIN) 500 MCG tablet Take 1,000 mcg by mouth daily. ?Patient not taking: Reported on 03/12/2021 11/02/20   [provider]  ? ? ?Physical Exam: ?Vitals:  ? 05/16/21 1518 05/16/21 1600 05/16/21 1630 05/16/21 1645  ?BP:  96/83 (!) 123/95 118/89  ?Pulse:  98  98  ?Resp:  (!) 21 15 15   ?Temp:      ?TempSrc:      ?SpO2: 98% 93%  93%  ?Weight:      ?Height:      ? ?General exam: Awake, laying in bed, in nad ?Respiratory system: Normal respiratory effort, no wheezing ?Cardiovascular system:  regular rate, s1, s2 ?Gastrointestinal system: Soft, nondistended, positive BS ?Central nervous system: CN2-12 grossly intact, strength intact ?Extremities: Perfused, no clubbing ?Skin: Normal skin turgor, no notable skin lesions seen ?Psychiatry: Unable to assess given mentation ? ?Data Reviewed: ? ?CXR reviewed: Hazy opacities in the B lung bases ? ?Assessment and Plan: ?* Aspiration pneumonia (HCC) ?-CXR reviewed, findings concerning for B infiltrates ?-brownish secretions suctioned out in ED with resultant improvement in O2 requirments ?-Concerns for aspiration. ?-Pt was evaluated by SLP in 2/23, recs noted for dysphagia 2 diet with thin liquids at that time, with concerns for mild-mod aspiration risk ?-Given concerns of aspiration, will keep NPO for now and re-consult SLP ?-Pt has PCN allergy documented, but has historically received cephalosporins in past. Will cont on azithro plus rocephin ? ?Seizure (HCC) ?-No evidence of seizure activity at this time ?-Would cont home meds once confirmed by pharmacy ? ?AKI (acute kidney injury) (HCC) ?-Presenting Cr of 1.58, baseline Cr <1 ?-Cont IVF as tolerated ?-Repeat bmet in AM ? ?Dementia with behavioral disturbance (HCC) ?-Not very conversant ?-Cont to monitor for now ?-Have consulted PT/OT ? ?Benign essential HTN ?-BP stable and controlled at this time ?-Would cont home regimen as tolerated ? ?Cerebral infarction [facility]) ?-Seems stable at this time ? ? ? ? ? Advance Care Planning:   Code Status: Full Code Full ? ?Consults:  ? ?Family Communication: Pt in room, family not at bedside ? ?Severity of Illness: ?The appropriate patient status for this patient is INPATIENT. Inpatient status is judged to be reasonable and necessary in order to provide the required intensity of service to ensure the patient's safety. The patient's presenting symptoms, physical exam findings, and initial radiographic and laboratory data in the context of their chronic comorbidities is felt to  place them at high risk for further clinical deterioration. Furthermore, it is not anticipated that the patient will be medically stable for discharge from the hospital within 2 midnights of admission.  ? ?* I certify that at the point of admission it is my clinical judgment that the patient will require inpatient hospital care spanning beyond 2 midnights from the point of admission due to high intensity of service, high risk for further deterioration and high frequency of surveillance required.* ? ?Author: ?[facility], MD ?05/16/2021 5:40 PM ? ?For on call review [email or web address].  ?

## 2021-05-16 NOTE — ED Provider Notes (Signed)
?[facility]-EMERGENCY DEPT ?Provider Note ? ? ?CSN: [id number] ?Arrival date & time: 05/16/21  1418 ? ?  ? ?History ? ?Chief Complaint  ?Patient presents with  ? Shortness of Breath  ? ? ?Johnathan Mitchell. is a 87 y.o. male hx of seizure, recurrent pneumonia here presenting with shortness of breath and hypoxia.  Patient is bedbound facility.  Patient has history of aspiration pneumonia.  Patient was noted to be altered and hypoxic in the facility.  Patient was noted to have oxygen level was 78% on room air.  Patient unable to give history.  Patient was recently admitted to the hospital for aspiration pneumonia and required IV antibiotics. ? ? ?The history is provided by the EMS personnel.  ? ?  ? ?Home Medications ?Prior to Admission medications   ?Medication Sig Start Date End Date Taking? Authorizing Provider  ?acetaminophen (TYLENOL) 500 MG tablet Take 1 tablet (500 mg total) by mouth every 8 (eight) hours. ?Patient taking differently: Take 500-1,000 mg by mouth 3 (three) times daily as needed for mild pain, moderate pain, headache or fever. 12/20/19   Almon Hercules, MD  ?aspirin EC 81 MG tablet Take 1 tablet (81 mg total) by mouth daily. Swallow whole. 03/15/21 03/15/22  Hughie Closs, MD  ?atorvastatin (LIPITOR) 80 MG tablet Take 1 tablet (80 mg total) by mouth daily at 6 PM. ?Patient not taking: Reported on 03/12/2021 08/20/17   Medina-Vargas, Monina C, NP  ?busPIRone (BUSPAR) 5 MG tablet Take 2.5 mg by mouth 4 (four) times daily as needed (anxiety).    [provider]  ?Cholecalciferol 25 MCG (1000 UT) capsule Take 1,000 Units by mouth daily. ?Patient not taking: Reported on 03/12/2021    [provider]  ?diclofenac Sodium (VOLTAREN) 1 % GEL Apply 2 g topically 4 (four) times daily as needed for pain. ?Patient not taking: Reported on 03/12/2021 08/16/20   [provider]  ?HYDROPHILIC EX Apply 1 application topically 2 (two) times daily as needed (moiturizer). ?Patient  not taking: Reported on 03/12/2021    [provider]  ?levETIRAcetam (KEPPRA) 500 MG tablet Take 1 tablet (500 mg total) by mouth 2 (two) times daily. 08/08/20   Lorre Nick, MD  ?potassium chloride SA (KLOR-CON M) 20 MEQ tablet Take 1 tablet (20 mEq total) by mouth daily. 03/15/21 04/14/21  Hughie Closs, MD  ?vitamin B-12 (CYANOCOBALAMIN) 500 MCG tablet Take 1,000 mcg by mouth daily. ?Patient not taking: Reported on 03/12/2021 11/02/20   [provider]  ?   ? ?Allergies    ?Penicillins, Valproate sodium, and Lexapro [escitalopram]   ? ?Review of Systems   ?Review of Systems  ?Respiratory:  Positive for shortness of breath.   ?All other systems reviewed and are negative. ? ?Physical Exam ?Updated Vital Signs ?BP 96/83   Pulse 98   Temp 98.1 ?F (36.7 ?C) (Axillary)   Resp (!) 21   Ht 5\' 6"  (1.676 m)   Wt 56 kg   SpO2 93%   BMI 19.93 kg/m?  ?Physical Exam ?Vitals and nursing note reviewed.  ?Constitutional:   ?   Comments: Chronically ill and gurgling  ?HENT:  ?   Head: Normocephalic.  ?   Mouth/Throat:  ?   Pharynx: Oropharynx is clear.  ?Eyes:  ?   Extraocular Movements: Extraocular movements intact.  ?   Pupils: Pupils are equal, round, and reactive to light.  ?Cardiovascular:  ?   Rate and Rhythm: Normal rate and regular rhythm.  ?  Pulmonary:  ?   Comments: Tachypneic, crackles bilateral bases ?Musculoskeletal:     ?   General: Normal range of motion.  ?   Cervical back: Normal range of motion and neck supple.  ?Skin: ?   General: Skin is warm.  ?   Capillary Refill: Capillary refill takes less than 2 seconds.  ?Neurological:  ?   General: No focal deficit present.  ?   Mental Status: He is oriented to person, place, and time.  ?Psychiatric:     ?   Mood and Affect: Mood normal.     ?   Behavior: Behavior normal.  ? ? ?ED Results / Procedures / Treatments   ?Labs ?(all labs ordered are listed, but only abnormal results are displayed) ?Labs Reviewed  ?LACTIC ACID, PLASMA - Abnormal; Notable  for the following components:  ?    Result Value  ? Lactic Acid, Venous 4.0 (*)   ? All other components within normal limits  ?COMPREHENSIVE METABOLIC PANEL - Abnormal; Notable for the following components:  ? Glucose, Bld 122 (*)   ? BUN 25 (*)   ? Creatinine, Ser 1.58 (*)   ? Albumin 3.2 (*)   ? GFR, Estimated 43 (*)   ? All other components within normal limits  ?CBC WITH DIFFERENTIAL/PLATELET - Abnormal; Notable for the following components:  ? RDW 18.4 (*)   ? All other components within normal limits  ?BLOOD GAS, VENOUS - Abnormal; Notable for the following components:  ? pO2, Ven <31 (*)   ? All other components within normal limits  ?RESP PANEL BY RT-PCR (FLU A&B, COVID) [id number]  ?CULTURE, BLOOD (ROUTINE X 2)  ?CULTURE, BLOOD (ROUTINE X 2)  ?URINE CULTURE  ?PROTIME-INR  ?APTT  ?MAGNESIUM  ?LACTIC ACID, PLASMA  ?URINALYSIS, ROUTINE W REFLEX MICROSCOPIC  ? ? ?EKG ?None ? ?Radiology ?DG Chest Port 1 View ? ?Result Date: 05/16/2021 ?CLINICAL DATA:  Questionable sepsis. EXAM: PORTABLE CHEST 1 VIEW COMPARISON:  March 12, 2021. FINDINGS: The heart size and mediastinal contours are within normal limits. Hazy opacities in the bilateral lung bases may reflect atelectasis or consolidation. No pleural effusion. No pneumothorax. Degenerative changes spine and bilateral shoulders. IMPRESSION: Hazy opacities in the bilateral lung bases are favored to reflect atelectasis though infection not excluded. Electronically Signed   By: Maudry MayhewJeffrey  Waltz M.D.   On: 05/16/2021 16:00   ? ?Procedures ?Procedures  ? ? ?CRITICAL CARE ?Performed by: Richardean Canalavid H Makhia Vosler ? ? ?Total critical care time: 30 minutes ? ?Critical care time was exclusive of separately billable procedures and treating other patients. ? ?Critical care was necessary to treat or prevent imminent or life-threatening deterioration. ? ?Critical care was time spent personally by me on the following activities: development of treatment plan with patient and/or surrogate as well as  nursing, discussions with consultants, evaluation of patient's response to treatment, examination of patient, obtaining history from patient or surrogate, ordering and performing treatments and interventions, ordering and review of laboratory studies, ordering and review of radiographic studies, pulse oximetry and re-evaluation of patient's condition. ? ? ?Medications Ordered in ED ?Medications  ?lactated ringers infusion (has no administration in time range)  ?lactated ringers bolus 1,000 mL (1,000 mLs Intravenous New Bag/Given 05/16/21 1607)  ?vancomycin (VANCOCIN) IVPB 1000 mg/200 mL premix (1,000 mg Intravenous New Bag/Given 05/16/21 1554)  ?ceFEPIme (MAXIPIME) 2 g in sodium chloride 0.9 % 100 mL IVPB (2 g Intravenous New Bag/Given 05/16/21 1556)  ?lactated ringers bolus 1,000 mL (has no administration in  time range)  ? ? ?ED Course/ Medical Decision Making/ A&P ?  ?                        ?Medical Decision Making ?Johnathan Mitchell. is a 87 y.o. male here presenting with cough and hypoxia.  Patient has a history of aspiration pneumonia.  Patient is hypoxic initially requiring nonrebreather.  Plan to get sepsis work-up with CBC CMP and lactate and cultures and chest x-ray.  Will give broad-spectrum antibiotics for HCAP  ? ?4:12 PM ?Patient was suctioned and patient is able to tolerate 2 L nasal cannula.  Creatinine is 1.6.  Baseline is 0.7.  Patient was given a liter bolus.  Patient's white blood cell count is normal.  Chest x-ray showed bilateral opacities.  Patient was given broad-spectrum antibiotics. At this point hospitalist to admit for sepsis from HCAP and hypoxia ? ? ?Problems Addressed: ?HCAP (healthcare-associated pneumonia): acute illness or injury ?Hypoxia: acute illness or injury ?Sepsis, due to unspecified organism, unspecified whether acute organ dysfunction present [facility]): acute illness or injury ? ?Amount and/or Complexity of Data Reviewed ?Labs: ordered. Decision-making details documented in ED  Course. ?Radiology: ordered and independent interpretation performed. Decision-making details documented in ED Course. ?ECG/medicine tests: ordered and independent interpretation performed. Decision-making deta

## 2021-05-16 NOTE — Progress Notes (Signed)
RT NOTE: ? ?RT called to evaluate pt. Pt on NRB with upper airway rhonchi. Verbal order of nasal tracheal suctioning given by Silverio Lay, MD. RT obtained about 50cc of sputum from NTS. Pt breath sounds are now diminished and pt now sustaining a saturation of 97% on 4L [place]. Vitals are stable at this time. ?

## 2021-05-16 NOTE — ED Triage Notes (Signed)
Pt BIB EMS from SNF with c/o shortness of breath and to r/o aspiration pneumonia. Pt on 5L St.  on arrival, pt normally on room air. Per EMS pt is bedbound and at baseline with mental status.  ?

## 2021-05-16 NOTE — Assessment & Plan Note (Addendum)
-  No evidence of seizure activity at this time ?-Currently continued on IV keppra for now ?

## 2021-05-16 NOTE — Assessment & Plan Note (Addendum)
-  CXR reviewed, findings concerning for B infiltrates ?-brownish secretions suctioned out in ED with resultant improvement in O2 requirments ?-Concerns for aspiration. ?-Will cont on azithro plus rocephin ?-Given recurrent aspiration in setting of baseline dementia, have consulted Palliative Care to establish goals of care. ?-Pt has passed swallow eval 06/16/22, now on dysphagia 1 with nectar thick liquids ?-Recommend palliative care support at his facility ?

## 2021-05-17 DIAGNOSIS — I10 Essential (primary) hypertension: Secondary | ICD-10-CM

## 2021-05-17 DIAGNOSIS — F03918 Unspecified dementia, unspecified severity, with other behavioral disturbance: Secondary | ICD-10-CM | POA: Diagnosis not present

## 2021-05-17 LAB — COMPREHENSIVE METABOLIC PANEL
ALT: 19 U/L (ref 0–44)
AST: 22 U/L (ref 15–41)
Albumin: 2.5 g/dL — ABNORMAL LOW (ref 3.5–5.0)
Alkaline Phosphatase: 46 U/L (ref 38–126)
Anion gap: 7 (ref 5–15)
BUN: 27 mg/dL — ABNORMAL HIGH (ref 8–23)
CO2: 23 mmol/L (ref 22–32)
Calcium: 8.2 mg/dL — ABNORMAL LOW (ref 8.9–10.3)
Chloride: 106 mmol/L (ref 98–111)
Creatinine, Ser: 0.89 mg/dL (ref 0.61–1.24)
GFR, Estimated: >60 mL/min (ref >60)
Glucose, Bld: 105 mg/dL — ABNORMAL HIGH (ref 70–99)
Potassium: 3.5 mmol/L (ref 3.5–5.1)
Sodium: 136 mmol/L (ref 135–145)
Total Bilirubin: 0.7 mg/dL (ref 0.3–1.2)
Total Protein: 5.9 g/dL — ABNORMAL LOW (ref 6.5–8.1)

## 2021-05-17 LAB — CBC
HCT: 34 % — ABNORMAL LOW (ref 39.0–52.0)
Hemoglobin: 11.2 g/dL — ABNORMAL LOW (ref 13.0–17.0)
MCH: 27.7 pg (ref 26.0–34.0)
MCHC: 32.9 g/dL (ref 30.0–36.0)
MCV: 84 fL (ref 80.0–100.0)
Platelets: 178 10*3/uL (ref 150–400)
RBC: 4.05 MIL/uL — ABNORMAL LOW (ref 4.22–5.81)
RDW: 18 % — ABNORMAL HIGH (ref 11.5–15.5)
WBC: 7.2 10*3/uL (ref 4.0–10.5)
nRBC: 0 % (ref 0.0–0.2)

## 2021-05-17 LAB — URINALYSIS, ROUTINE W REFLEX MICROSCOPIC
Bilirubin Urine: NEGATIVE
Glucose, UA: NEGATIVE mg/dL
Ketones, ur: NEGATIVE mg/dL
Leukocytes,Ua: NEGATIVE
Nitrite: NEGATIVE
Protein, ur: NEGATIVE mg/dL
Specific Gravity, Urine: 1.017 (ref 1.005–1.030)
pH: 5 (ref 5.0–8.0)

## 2021-05-17 MED ORDER — CHLORHEXIDINE GLUCONATE 0.12 % MT SOLN
15.0000 mL | Freq: Two times a day (BID) | OROMUCOSAL | Status: DC
  Administered 2021-05-18 – 2021-05-25 (×13): 15 mL via OROMUCOSAL
  Filled 2021-05-17 (×8): qty 15

## 2021-05-17 MED ORDER — HYDRALAZINE HCL 20 MG/ML IJ SOLN
10.0000 mg | INTRAMUSCULAR | Status: DC | PRN
  Administered 2021-05-17 – 2021-05-25 (×4): 10 mg via INTRAVENOUS
  Filled 2021-05-17 (×4): qty 1

## 2021-05-17 MED ORDER — ENOXAPARIN SODIUM 40 MG/0.4ML IJ SOSY
40.0000 mg | PREFILLED_SYRINGE | INTRAMUSCULAR | Status: DC
  Administered 2021-05-17 – 2021-05-22 (×6): 40 mg via SUBCUTANEOUS
  Filled 2021-05-17 (×8): qty 0.4

## 2021-05-17 NOTE — Evaluation (Addendum)
Clinical/Bedside Swallow Evaluation ?Patient Details  ?Name: Johnathan Mitchell. ?MRN: [id number] ?Date of Birth: 10-08-1934 ? ?Today's Date: 05/17/2021 ?Time: SLP Start Time (ACUTE ONLY): [id number] SLP Stop Time (ACUTE ONLY): 0745 ?SLP Time Calculation (min) (ACUTE ONLY): 10 min ? ?Past Medical History:  ?Past Medical History:  ?Diagnosis Date  ? Asthma   ? Dementia ([place])   ? Gout   ? Hypertension   ? Seizures ([place])   ? Stroke [facility])   ? ?Past Surgical History:  ?Past Surgical History:  ?Procedure Laterality Date  ? BACK SURGERY    ? FEMUR IM NAIL Left 05/13/2017  ? Procedure: INTRAMEDULLARY (IM) NAIL FEMORAL;  Surgeon: Altamese Belk, MD;  Location: [place];  Service: Orthopedics;  Laterality: Left;  ? KNEE SURGERY    ? ?HPI:  ?87 yo male adm to [facility] with recurrent asp from SNF. He is non verbal and has h/o CVA and is nonverbal. Hazy opacities in the bilateral lung bases are favored to reflect atelectasis though infection not excluded. CXR 4/19.  [facility] recently at [facility] and underwent SLP swallow eval - Recommended diet was dys2/thin - Pt with subtle cough during session on prior evaluation but SLP felt not coorelated to po intake.  ?  ?Assessment / Plan / Recommendation  ?Clinical Impression ? SLP arrived to room to assess readiness for MBS- assure pt will swallow.  He moans, but does not articulate,  during oral care provided by RN.   Congested breathing noted - concerning for retained secretions in pharynx/larynx causing aspiration. Pt does not cough on command.  Provided single small amounts of water to anterior oral cavity via spoon. Pt does not seal his lip on the spoon - nor attempt to initiate a swallow 0 only moans.  Warmer liquids provided - due to concern for  potential dentition discomfort- but pt did not initiate a swallow.  Delayed cough noted - likely from premature spillage of water into pharynx/larynx, despite oral suctioning provided.  At this time, recommend strict NPO with frequent oral care.  Will follow up for  readiness for MBS - but at this time, pt unable to initiate a swallow.  RN informed and agrees. Hopefully as acute illness improves, swallow function will as well.  Pt's lack of swallow initiation makes an MBS futile., thus BSE completed.    ?SLP Visit Diagnosis: Dysphagia, oral phase (R13.11);Dysphagia, unspecified (R13.10) ?   ?Aspiration Risk ? Risk for inadequate nutrition/hydration;Severe aspiration risk  ?  ?Diet Recommendation NPO  ? ?Medication Administration: Via alternative means  ?  ?Other  Recommendations Oral Care Recommendations: Oral care QID ?Other Recommendations: Have oral suction available   ? ?Recommendations for follow up therapy are one component of a multi-disciplinary discharge planning process, led by the attending physician.  Recommendations may be updated based on patient status, additional functional criteria and insurance authorization. ? ?Follow up Recommendations Skilled nursing-short term rehab (<3 hours/day)  ? ? ?  ?Assistance Recommended at Discharge Frequent or constant Supervision/Assistance  ?Functional Status Assessment Patient has had a recent decline in their functional status and/or demonstrates limited ability to make significant improvements in function in a reasonable and predictable amount of time  ?Frequency and Duration min 1 x/week  ?1 week ?  ?   ? ?Prognosis Prognosis for Safe Diet Advancement: Fair ?Barriers to Reach Goals: Severity of deficits;Time post onset  ? ?  ? ?Swallow Study   ?General Date of Onset: 05/17/21 ?HPI: 87 yo male adm to wlh with recurrent  asp from SNF. He is non verbal and has h/o CVA and is nonverbal. Hazy opacities in the bilateral lung bases are favored to reflect atelectasis though infection not excluded. CXR 4/19.  Pt recently at [facility] and underwent SLP swallow eval - Recommended diet was dys2/thin - Pt with subtle cough during session on prior evaluation but SLP felt not coorelated to po intake. ?Type of Study: Bedside Swallow  Evaluation ?Previous Swallow Assessment: prior bses ?Diet Prior to this Study: NPO;IV (x meds) ?Temperature Spikes Noted: No ?Respiratory Status: Nasal cannula ?History of Recent Intubation: No ?Behavior/Cognition: Alert;Doesn't follow directions ?Oral Cavity Assessment: Within Functional Limits ?Oral Care Completed by SLP: Recent completion by staff (RN provided oral suction when in room with pt) ?Self-Feeding Abilities: Total assist ?Patient Positioning: Upright in bed ?Baseline Vocal Quality: Low vocal intensity;Wet ?Volitional Cough: Cognitively unable to elicit ?Volitional Swallow: Unable to elicit  ?  ?Oral/Motor/Sensory Function Overall Oral Motor/Sensory Function: Generalized oral weakness (pt did not seal lips on spoon, difficult to assess oral motor function due to pt only moaning)   ?Ice Chips Ice chips: Impaired ?Presentation: Spoon ?Oral Phase Impairments: Poor awareness of bolus;Reduced labial seal;Reduced lingual movement/coordination ?Oral Phase Functional Implications: Oral holding   ?Thin Liquid Thin Liquid: Impaired ?Presentation: Spoon ?Oral Phase Impairments: Reduced labial seal;Poor awareness of bolus ?Oral Phase Functional Implications: Oral holding ?Other Comments: oral suction provided  ?  ?Nectar Thick Nectar Thick Liquid: Not tested   ?Honey Thick Honey Thick Liquid: Not tested   ?Puree Puree: Not tested   ?Solid ? ? ?  Solid: Not tested  ? ?  ? ?[name] ?05/17/2021,8:08 AM ? ?Kathleen Lime, MS CCC SLP ?Acute Rehab Services ?Office [phone] ?Pager [phone] ? ? ?

## 2021-05-17 NOTE — Consult Note (Signed)
WOC Nurse Consult Note: ?Reason for Consult: pressure injury; right buttock  ?Wound type: no wounds ?Pressure Injury POA: NA ? ?Area of uneven topography very near anus; hemorrhoids, no open wounds, scattered areas over the sacrum; completely re-epithelialized.  ?Patient bedbound and non verbal unable to obtain any history of previous areas of concern.   ? ?No topical care needed.  ? ?Discussed POC with bedside nurse.  ?Re consult if needed, will not follow at this time. ?Thanks ? Jamiee [facility]holland [facility] MSN, RN,CWOCN, CNS, CWON-AP [phone])   ? ?  ?

## 2021-05-17 NOTE — TOC Initial Note (Signed)
Transition of Care (TOC) - Initial/Assessment Note  ? ? ?Patient Details  ?Name: Johnathan Mitchell. ?MRN: [id number] ?Date of Birth: 1934-08-15 ? ?Transition of Care (TOC) CM/SW Contact:    ?Latina Frank, Meriam Sprague, RN ?Phone Number: ?05/17/2021, 2:12 PM ? ?Clinical Narrative:                 ?Pt is long term care at [facility]. He will return there at dc. TOC will follow along. ? ?Expected Discharge Plan: Skilled Nursing Facility ?Barriers to Discharge: Continued Medical Work up ? ? ?Patient Goals and CMS Choice ?  ?  ?  ? ?Expected Discharge Plan and Services ?Expected Discharge Plan: Skilled Nursing Facility ?  ?Discharge Planning Services: CM Consult ?  ?Living arrangements for the past 2 months: Skilled Nursing Facility ?                ?  ?  ?  ?  ?  ?  ?  ?  ?  ?  ? ?Prior Living Arrangements/Services ?Living arrangements for the past 2 months: Skilled Nursing Facility ?Lives with:: Facility Resident ?Patient language and need for interpreter reviewed:: Yes ?       ?  ?  ?  ?Criminal Activity/Legal Involvement Pertinent to Current Situation/Hospitalization: No - Comment as needed ? ?Activities of Daily Living ?  ?  ? ?Permission Sought/Granted ?  ?  ?   ?   ?   ?   ? ?Emotional Assessment ?  ?  ?  ?  ?  ?  ? ?Admission diagnosis:  Aspiration pneumonia (HCC) [J69.0] ?Hypoxia [R09.02] ?HCAP (healthcare-associated pneumonia) [J18.9] ?Sepsis, due to unspecified organism, unspecified whether acute organ dysfunction present (HCC) [A41.9] ?Patient Active Problem List  ? Diagnosis Date Noted  ? Aspiration pneumonia (HCC) 05/16/2021  ? Pressure injury of skin 03/13/2021  ? Hypophosphatemia 03/13/2021  ? CAP (community acquired pneumonia) 03/13/2021  ? Hypokalemia 03/12/2021  ? Dehydration 03/12/2021  ? Prolonged QT interval 03/12/2021  ? Generalized weakness 03/12/2021  ? Seizure (HCC) 03/12/2021  ? Cellulitis of left hand 12/15/2019  ? Wrist pain 12/15/2019  ? Closed left acetabular fracture (HCC) 12/14/2019  ? SIRS (systemic  inflammatory response syndrome) (HCC) 12/14/2019  ? Protein-calorie malnutrition (HCC) 05/20/2017  ? TIA (transient ischemic attack) 05/20/2017  ? 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 AKI (acute kidney injury) (HCC) 05/12/2017  ? Stroke (HCC) 05/11/2017  ? Femur fracture (HCC) 05/11/2017  ? Chest pain 02/09/2016  ? HTN (hypertension) 02/09/2016  ? Dementia with behavioral disturbance (HCC) 02/09/2016  ? Chest pain at rest 02/09/2016  ? Cerebral infarction (HCC) 02/01/2014  ? Benign essential HTN 02/01/2014  ? ?PCP:  Administration, [facility] ?Pharmacy:   ?[facility] - [place], [place] - [address] ?[phone] ?[place] [address] ?Phone: [phone] Fax: [phone] ? ? ? ? ?Social Determinants of Health (SDOH) Interventions ?  ? ?Readmission Risk Interventions ? ?  05/17/2021  ?  2:11 PM  ?Readmission Risk Prevention Plan  ?Transportation Screening Complete  ?PCP or Specialist Appt within 3-5 Days Complete  ?HRI or Home Care Consult Complete  ?Social Work Consult for Recovery Care Planning/Counseling Complete  ?Palliative Care Screening Not Applicable  ?Medication Review Oceanographer) Complete  ? ? ? ?

## 2021-05-17 NOTE — Progress Notes (Signed)
?  Progress Note ? ? ?Patient: Johnathan Mitchell. LPF:[id number] DOB: [birth year] DOA: 05/16/2021     1 ?DOS: the patient was seen and examined on 05/17/2021 ?  ?Brief hospital course: ?87 y.o. male with medical history significant of aspiration PNA, HTN, seizures, asthma, prior CVA who presents from facility with concerns of recurrent aspiration PNA. Pt is currently unable to provide his own history. Per report, pt was recently admitted for PNA and discharged to SNF. Pt noted to be acutely hypoxemic with O2 sats down to 78% on RA, requiring supplemental O2. On presentation to ED, pt had CXR performed which demonstrated hazy opacities in the B lung bases, reviewed. Pt was given dose of rocephin and hospitalist consulted for consideration for admission ? ?Assessment and Plan: ?* Aspiration pneumonia (HCC) ?-CXR reviewed, findings concerning for B infiltrates ?-brownish secretions suctioned out in ED with resultant improvement in O2 requirments ?-Concerns for aspiration. ?-Pt was evaluated by SLP in 2/23, recs noted for dysphagia 2 diet with thin liquids at that time, with concerns for mild-mod aspiration risk ?-Given concerns of aspiration, consulted SLP. Appreciate recs, noted for continued NPO status, alternative means for medications ?-Pt has PCN allergy documented, but has historically received cephalosporins in past. Will cont on azithro plus rocephin ?-Given recurrent aspiration in setting of baseline dementia, have consulted Palliative Care to establish goals of care ? ?Seizure (HCC) ?-No evidence of seizure activity at this time ?-Would cont on IV keppra for now ? ?AKI (acute kidney injury) (HCC) ?-Presenting Cr of 1.58, baseline Cr <1 ?-Cr normalized to 0.89 with IVF ?-Repeat bmet in AM ? ?Dementia with behavioral disturbance (HCC) ?-Not very conversant ?-Cont to monitor for now ?-Have consulted PT/OT, recs were noted for SNF ? ?Benign essential HTN ?-BP stable at this time ?-Cont on PRN hydralazine as  needed ? ?Cerebral infarction [facility]) ?-Seems stable at this time ? ? ?  ? ?Subjective: Unable to assess as pt is not very conversant ? ?Physical Exam: ?Vitals:  ? 05/16/21 2004 05/17/21 0050 05/17/21 0105 05/17/21 0453  ?BP: (!) 133/96 (!) 150/119  (!) 154/111  ?Pulse: 82 85  95  ?Resp: 15 16  17   ?Temp: 99.2 ?F (37.3 ?C) 98.1 ?F (36.7 ?C)  98 ?F (36.7 ?C)  ?TempSrc: Axillary Oral  Oral  ?SpO2: 94% 99%  96%  ?Weight:   55.2 kg   ?Height:      ? ?General exam: Awake, laying in bed, in nad ?Respiratory system: Normal respiratory effort, no wheezing ?Cardiovascular system: regular rate, s1, s2 ?Gastrointestinal system: Soft, nondistended, positive BS ?Central nervous system: CN2-12 grossly intact, strength intact ?Extremities: Perfused, no clubbing ?Skin: Normal skin turgor, no notable skin lesions seen ?Psychiatry: Unable to assess given mentation ? ?Data Reviewed: ? ?Labs reviewed: K 3.5, Cr 0.89 ? ?Family Communication: Pt in room, family not at bedside ? ?Disposition: ?Status is: Inpatient ?Remains inpatient appropriate because: Severity of illness ? Planned Discharge Destination: Skilled nursing facility ? ? ? ? ?Author: ? , MD ?05/17/2021 3:53 PM ? ?For on call review [email or web address].  ?

## 2021-05-17 NOTE — Evaluation (Signed)
Occupational Therapy Evaluation ?Patient Details ?Name: Johnathan Mitchell. ?MRN: [id number] ?DOB: Jun 11, 1934 ?Today's Date: 05/17/2021 ? ? ?History of Present Illness Patient is an 87 year old male admitted from SNF due to concerns of aspiration PNA. PMH: CVA, HTN, seizures, asthma  ? ?Clinical Impression ?  ?Patient is unable to provide history as he is non-verbal, only stated "yeah" when asked what his birthday is. Patient contracted in bilateral upper extremities and calls out in pain with attempts of PROM. Was able to place wash clothes in palm of bilateral hands as patient has long untrimmed finger nails and at risk for wounds. Patient with L lateral lean needing total A to roll and place pillows to help reposition to midline. Patient did not initiate following any 1 step directions. Patient is total care, no skilled acute OT needs identified. Will sign off.   ?   ? ?Recommendations for follow up therapy are one component of a multi-disciplinary discharge planning process, led by the attending physician.  Recommendations may be updated based on patient status, additional functional criteria and insurance authorization.  ? ?Follow Up Recommendations ? Long-term institutional care without follow-up therapy  ?  ?Assistance Recommended at Discharge Frequent or constant Supervision/Assistance  ?Patient can return home with the following Two people to help with walking and/or transfers;Two people to help with bathing/dressing/bathroom;Assistance with feeding;Direct supervision/assist for medications management;Direct supervision/assist for financial management ? ?  ?Functional Status Assessment ? Patient has not had a recent decline in their functional status  ?Equipment Recommendations ? None recommended by OT  ?  ?   ?   ? ?Mobility Bed Mobility ?Overal bed mobility: Needs Assistance ?Bed Mobility: Rolling ?Rolling: Total assist ?  ?  ?  ?  ?General bed mobility comments: Rolled patient to place pillows so that he  would be able to maintain midline. ?  ? ?Transfers ?  ?  ?  ?  ?  ?  ?  ?  ?  ?General transfer comment: Did not attempt, patient will need to be hoyer lift for out of bed ?  ? ?  ?   ? ?ADL either performed or assessed with clinical judgement  ? ?ADL Overall ADL's : At baseline ?  ?  ?  ?  ?  ?  ?  ?  ?  ?  ?  ?  ?  ?  ?  ?  ?  ?  ?  ?General ADL Comments: Unsure exactly how much assistance patient received at SNF however given nature of contracted extremities patient will be total care  ? ? ? ? ?Pertinent Vitals/Pain Pain Assessment ?Pain Assessment: Faces ?Faces Pain Scale: Hurts even more ?Pain Location: generalized with UE movement/repositioning ?Pain Descriptors / Indicators: Moaning, Grimacing ?Pain Intervention(s): Monitored during session, Limited activity within patient's tolerance  ? ? ? ?Hand Dominance Right ?  ?Extremity/Trunk Assessment Upper Extremity Assessment ?Upper Extremity Assessment: RUE deficits/detail;LUE deficits/detail;Difficult to assess due to impaired cognition ?RUE Deficits / Details: Digits/hand in flexed position painful to go into extension. Patient initially keeping arm in extension with painful flexion at elbow. Patient self able to flex elbow to ~120 degrees then keeps in flexed position ?RUE: Unable to fully assess due to pain ?RUE Coordination: decreased gross motor;decreased fine motor ?LUE Deficits / Details: Hx of CVA with L contractures. Hand in flexion and painful to try and extend digits even 5 degrees. ?LUE: Unable to fully assess due to pain ?LUE Coordination: decreased gross motor;decreased fine motor ?  ?  Lower Extremity Assessment ?Lower Extremity Assessment: Defer to PT evaluation ?  ?  ?  ?Communication Communication ?Communication: Expressive difficulties ?  ?Cognition Arousal/Alertness: Awake/alert ?Behavior During Therapy: Flat affect ?Overall Cognitive Status: History of cognitive impairments - at baseline ?  ?  ?  ?  ?  ?  ?  ?  ?  ?  ?  ?  ?  ?  ?  ?  ?General  Comments: Patient with history of CVA and cognitive deficits. Did not initiate or follow any directions during session. When asked if he can state his birthday pt responds "yeah" otherwise does not speak during evaluation ?  ?  ?   ?   ?   ? ? ?Home Living Family/patient expects to be discharged to:: Skilled nursing facility ?  ?  ?  ?  ?  ?  ?  ?  ?  ?  ?  ?  ?  ?  ?  ?  ?  ?  ? ?  ?Prior Functioning/Environment Prior Level of Function : Patient poor historian/Family not available ?  ?  ?  ?  ?  ?  ?Mobility Comments: Per chart review family was assisting with wheelchair transfers back in [place]. Appears patient has been at SNF since this time and likely now a hoyer transfer ?  ?  ? ?  ?  ?OT Problem List: Pain ?  ?   ?   ?OT Goals(Current goals can be found in the care plan section) Acute Rehab OT Goals ?Patient Stated Goal: unable ?OT Goal Formulation: All assessment and education complete, DC therapy  ? ?AM-PAC OT "6 Clicks" Daily Activity     ?Outcome Measure Help from another person eating meals?: Total ?Help from another person taking care of personal grooming?: Total ?Help from another person toileting, which includes using toliet, bedpan, or urinal?: Total ?Help from another person bathing (including washing, rinsing, drying)?: Total ?Help from another person to put on and taking off regular upper body clothing?: Total ?Help from another person to put on and taking off regular lower body clothing?: Total ?6 Click Score: 6 ?  ?End of Session  ?Activity Tolerance: Patient limited by pain ?Patient left: in bed;with call bell/phone within reach;with bed alarm set ? ?OT Visit Diagnosis: Pain ?Pain - Right/Left:  (bilateral) ?Pain - part of body: Hand  ?              ?Time: 4098-1191 ?OT Time Calculation (min): 14 min ?Charges:  OT General Charges ?$OT Visit: 1 Visit ?OT Evaluation ?$OT Eval Low Complexity: 1 Low ? ?Marlyce Huge OT ?OT pager: [phone] ? ? ?Carmelia Roller ?05/17/2021, 11:59 AM ?

## 2021-05-17 NOTE — Hospital Course (Signed)
87 y.o. male with medical history significant of aspiration PNA, HTN, seizures, asthma, prior CVA who presents from facility with concerns of recurrent aspiration PNA. Pt is currently unable to provide his own history. Per report, pt was recently admitted for PNA and discharged to SNF. Pt noted to be acutely hypoxemic with O2 sats down to 78% on RA, requiring supplemental O2. On presentation to ED, pt had CXR performed which demonstrated hazy opacities in the B lung bases, reviewed. Pt was given dose of rocephin and hospitalist consulted for consideration for admission ?

## 2021-05-17 NOTE — NC FL2 (Signed)
?[place] MEDICAID FL2 LEVEL OF CARE SCREENING TOOL  ?  ? ?IDENTIFICATION  ?Patient Name: ?Navy Moebius. Birthdate: 02-Jun-1934 Sex: male Admission Date (Current Location): ?05/16/2021  ?[place] and [place] Number: ? [place] ?  Facility and Address:  ?[facility],  [address], [place] ?     Provider Number: ?[id number]  ?Attending Physician Name and Address:  ?Donne Hazel, MD ? Relative Name and Phone Number:  ?  ?   ?Current Level of Care: ?Hospital Recommended Level of Care: ?[place] Prior Approval Number: ?  ? ?Date Approved/Denied: ?  PASRR Number: ?[id number] ? ?Discharge Plan: ?SNF ?  ? ?Current Diagnoses: ?Patient Active Problem List  ? Diagnosis Date Noted  ? Aspiration pneumonia ([place]) 05/16/2021  ? Pressure injury of skin 03/13/2021  ? Hypophosphatemia 03/13/2021  ? CAP (community acquired pneumonia) 03/13/2021  ? Hypokalemia 03/12/2021  ? Dehydration 03/12/2021  ? Prolonged QT interval 03/12/2021  ? Generalized weakness 03/12/2021  ? Seizure ([place]) 03/12/2021  ? Cellulitis of left hand 12/15/2019  ? Wrist pain 12/15/2019  ? Closed left acetabular fracture ([place]) 12/14/2019  ? SIRS (systemic inflammatory response syndrome) ([place]) 12/14/2019  ? Protein-calorie malnutrition ([place]) 05/20/2017  ? TIA (transient ischemic attack) 05/20/2017  ? 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 AKI (acute kidney injury) ([place]) 05/12/2017  ? Stroke ([place]) 05/11/2017  ? Femur fracture ([place]) 05/11/2017  ? Chest pain 02/09/2016  ? HTN (hypertension) 02/09/2016  ? Dementia with behavioral disturbance ([place]) 02/09/2016  ? Chest pain at rest 02/09/2016  ? Cerebral infarction ([place]) 02/01/2014  ? Benign essential HTN 02/01/2014  ? ? ?Orientation RESPIRATION BLADDER Height & Weight   ?  ? (Unable to verbalize) ? Normal Incontinent Weight: 55.2 kg ?Height:  5\' 6"  (167.6 cm)  ?BEHAVIORAL SYMPTOMS/MOOD NEUROLOGICAL BOWEL NUTRITION STATUS  ?    Incontinent Diet (See dc summary)   ?AMBULATORY STATUS COMMUNICATION OF NEEDS Skin   ?Total Care Does not communicate Other (Comment) (Area of uneven topography very near anus; hemorrhoids, no open wounds, scattered areas over the sacrum; completely re-epithelialized. Blister to mid abdomen) ?  ?  ?  ?    ?     ?     ? ? ?Personal Care Assistance Level of Assistance  ?Bathing, Feeding, Dressing Bathing Assistance: Maximum assistance ?Feeding assistance: Maximum assistance ?Dressing Assistance: Maximum assistance ?   ? ?Functional Limitations Info  ?Sight, Hearing, Speech Sight Info: Impaired ?  ?   ? ? ?SPECIAL CARE FACTORS FREQUENCY  ?PT (By licensed PT), OT (By licensed OT)   ?  ?PT Frequency: 3 x weekly ?OT Frequency: 3 x weekly ?  ?  ?  ?   ? ? ?Contractures Contractures Info: Present (upper extremties)  ? ? ?Additional Factors Info  ?Code Status, Allergies Code Status Info: full ?Allergies Info: Penicillins, Valproate Sodium, Lexapro ?  ?  ?  ?   ? ?Current Medications (05/17/2021):  This is the current hospital active medication list ?Current Facility-Administered Medications  ?Medication Dose Route Frequency Provider Last Rate Last Admin  ? 0.9 %  sodium chloride infusion   Intravenous Continuous Donne Hazel, MD 75 mL/hr at 05/17/21 0543 New Bag at 05/17/21 0543  ? acetaminophen (TYLENOL) tablet 650 mg  650 mg Oral Q6H PRN Donne Hazel, MD      ? Or  ? acetaminophen (TYLENOL) suppository 650 mg  650 mg Rectal Q6H PRN Donne Hazel, MD      ?  azithromycin (ZITHROMAX) 500 mg in sodium chloride 0.9 % 250 mL IVPB  500 mg Intravenous Q24H Donne Hazel, MD   Stopped at 05/16/21 1900  ? cefTRIAXone (ROCEPHIN) 2 g in sodium chloride 0.9 % 100 mL IVPB  2 g Intravenous Q24H Donne Hazel, MD 200 mL/hr at 05/16/21 2241 2 g at 05/16/21 2241  ? enoxaparin (LOVENOX) injection 40 mg  40 mg Subcutaneous Q24H Karren Cobble, [place]      ? hydrALAZINE (APRESOLINE) injection 10 mg  10 mg Intravenous Q4H PRN Donne Hazel, MD      ?  levETIRAcetam (KEPPRA) IVPB 500 mg/100 mL premix  500 mg Intravenous Q12H Donne Hazel, MD 400 mL/hr at 05/17/21 0544 500 mg at 05/17/21 0544  ? ? ? ?Discharge Medications: ?Please see discharge summary for a list of discharge medications. ? ?Relevant Imaging Results: ? ?Relevant Lab Results: ? ? ?Additional Information ?SS# [id number] ? ?Detric Scalisi, Marjie Skiff, RN ? ? ? ? ?

## 2021-05-17 NOTE — Progress Notes (Signed)
PT Cancellation Note ? ?Patient Details ?Name: Johnathan Mitchell. ?MRN: [id number] ?DOB: Oct 06, 1934 ? ? ?Cancelled Treatment:    Reason Eval/Treat Not Completed: PT screened, no needs identified, will sign off. Pt arrived from LTC and is returning as total care pt, no acute skilled physical therapy needs identified, will sign off. ? ?Jamesetta Geralds, PT, DPT ?[facility] Rehabilitation Department ?Office: [phone] ?Pager: [phone] ?Jamesetta Geralds ?05/17/2021, 7:15 PM ?

## 2021-05-18 ENCOUNTER — Inpatient Hospital Stay (HOSPITAL_COMMUNITY): Payer: No Typology Code available for payment source

## 2021-05-18 DIAGNOSIS — N179 Acute kidney failure, unspecified: Secondary | ICD-10-CM | POA: Diagnosis not present

## 2021-05-18 DIAGNOSIS — I10 Essential (primary) hypertension: Secondary | ICD-10-CM | POA: Diagnosis not present

## 2021-05-18 DIAGNOSIS — F03918 Unspecified dementia, unspecified severity, with other behavioral disturbance: Secondary | ICD-10-CM | POA: Diagnosis not present

## 2021-05-18 LAB — COMPREHENSIVE METABOLIC PANEL
ALT: 17 U/L (ref 0–44)
AST: 20 U/L (ref 15–41)
Albumin: 2.5 g/dL — ABNORMAL LOW (ref 3.5–5.0)
Alkaline Phosphatase: 51 U/L (ref 38–126)
Anion gap: 10 (ref 5–15)
BUN: 19 mg/dL (ref 8–23)
CO2: 22 mmol/L (ref 22–32)
Calcium: 8.5 mg/dL — ABNORMAL LOW (ref 8.9–10.3)
Chloride: 110 mmol/L (ref 98–111)
Creatinine, Ser: 0.62 mg/dL (ref 0.61–1.24)
GFR, Estimated: >60 mL/min (ref >60)
Glucose, Bld: 72 mg/dL (ref 70–99)
Potassium: 3 mmol/L — ABNORMAL LOW (ref 3.5–5.1)
Sodium: 142 mmol/L (ref 135–145)
Total Bilirubin: 1 mg/dL (ref 0.3–1.2)
Total Protein: 5.6 g/dL — ABNORMAL LOW (ref 6.5–8.1)

## 2021-05-18 LAB — CBC
HCT: 30.7 % — ABNORMAL LOW (ref 39.0–52.0)
Hemoglobin: 10.4 g/dL — ABNORMAL LOW (ref 13.0–17.0)
MCH: 28.1 pg (ref 26.0–34.0)
MCHC: 33.9 g/dL (ref 30.0–36.0)
MCV: 83 fL (ref 80.0–100.0)
Platelets: 167 10*3/uL (ref 150–400)
RBC: 3.7 MIL/uL — ABNORMAL LOW (ref 4.22–5.81)
RDW: 17.7 % — ABNORMAL HIGH (ref 11.5–15.5)
WBC: 9 10*3/uL (ref 4.0–10.5)
nRBC: 0 % (ref 0.0–0.2)

## 2021-05-18 LAB — URINE CULTURE: Culture: NO GROWTH

## 2021-05-18 MED ORDER — POTASSIUM CHLORIDE 10 MEQ/100ML IV SOLN
10.0000 meq | INTRAVENOUS | Status: AC
  Administered 2021-05-18 (×6): 10 meq via INTRAVENOUS
  Filled 2021-05-18: qty 100

## 2021-05-18 NOTE — Progress Notes (Signed)
?  Progress Note ? ? ?Patient: Johnathan Mitchell. KAJ:[id number] DOB: [birth year] DOA: 05/16/2021     2 ?DOS: the patient was seen and examined on 05/18/2021 ?  ?Brief hospital course: ?87 y.o. male with medical history significant of aspiration PNA, HTN, seizures, asthma, prior CVA who presents from facility with concerns of recurrent aspiration PNA. Pt is currently unable to provide his own history. Per report, pt was recently admitted for PNA and discharged to SNF. Pt noted to be acutely hypoxemic with O2 sats down to 78% on RA, requiring supplemental O2. On presentation to ED, pt had CXR performed which demonstrated hazy opacities in the B lung bases, reviewed. Pt was given dose of rocephin and hospitalist consulted for consideration for admission ? ?Assessment and Plan: ?* Aspiration pneumonia (HCC) ?-CXR reviewed, findings concerning for B infiltrates ?-brownish secretions suctioned out in ED with resultant improvement in O2 requirments ?-Concerns for aspiration. ?-Pt was evaluated by SLP in 2/23, recs noted for dysphagia 2 diet with thin liquids at that time, with concerns for mild-mod aspiration risk ?-Given concerns of aspiration, consulted SLP. Appreciate recs, noted for continued NPO status, alternative means for medications ?-Pt has PCN allergy documented, but has historically received cephalosporins in past. Will cont on azithro plus rocephin ?-discussed with SLP. Pt failed MBS ?-Given recurrent aspiration in setting of baseline dementia, have consulted Palliative Care to establish goals of care, consideration for ? comfort feeds ? ?Seizure (HCC) ?-No evidence of seizure activity at this time ?-Would cont on IV keppra for now ? ?AKI (acute kidney injury) (HCC) ?-Presenting Cr of 1.58, baseline Cr <1 ?-Cr normalized with IVF ?-Recheck bmet in AM ? ?Dementia with behavioral disturbance (HCC) ?-seems more conversant ?-Cont to monitor for now ?-Have consulted PT/OT, recs were noted for SNF ? ?Benign essential  HTN ?-BP stable at this time ?-Cont on PRN hydralazine as needed ? ?Cerebral infarction [facility]) ?-Seems stable at this time ? ? ?  ? ?Subjective: Difficult to assess as pt is not very conversant ? ?Physical Exam: ?Vitals:  ? 05/17/21 2328 05/18/21 0209 05/18/21 0524 05/18/21 0600  ?BP: (!) 168/90 (!) 159/93 (!) 148/92   ?Pulse:  99 (!) 112   ?Resp:   18 19  ?Temp:   98 ?F (36.7 ?C)   ?TempSrc:   Oral   ?SpO2:   96%   ?Weight:      ?Height:      ? ?General exam: Conversant, in no acute distress ?Respiratory system: normal chest rise, clear, no audible wheezing ?Cardiovascular system: regular rhythm, s1-s2 ?Gastrointestinal system: Nondistended, nontender, pos BS ?Central nervous system: No seizures, no tremors ?Extremities: No cyanosis, no joint deformities ?Skin: No rashes, no pallor ?Psychiatry: Affect normal // no auditory hallucinations  ? ?Data Reviewed: ? ?Labs reviewed: K 3.0, Cr 0.62 ? ?Family Communication: Pt in room, family not at bedside ? ?Disposition: ?Status is: Inpatient ?Remains inpatient appropriate because: Severity of illness ? Planned Discharge Destination: Skilled nursing facility ? ? ? ? ?Author: ?Rickey Barbara, MD ?05/18/2021 5:01 PM ? ?For on call review [email or web address]  ?

## 2021-05-18 NOTE — Progress Notes (Signed)
Pt seen at bedside, today is alert and initiating a swalllow - congested breathing continues. Pt followed directions to cough but unable to clear. Will proceed with MBS for instrumental swallow evalution given h/o cva and concern for recurrent asp pna. RN aware and scheduled with xray.  ? ?Rolena Infante, MS [facility] SLP ?Acute Rehab Services ?Office [phone] ?Pager [phone] ? ?

## 2021-05-18 NOTE — Progress Notes (Signed)
Modified Barium Swallow Progress Note ? ?Patient Details  ?Name: Nithin Demeo. ?MRN: [id number] ?Date of Birth: 1934-02-12 ? ?Today's Date: 05/18/2021 ? ?Modified Barium Swallow completed.  Full report located under Chart Review in the Imaging Section. ? ?Brief recommendations include the following: ? ?Clinical Impression ? Limited MBS conducted due to pt decreased participation. Pt cried out during MBS when oral suctioning was provided - but he also cried out when touched. Drooling noted prior to any barium administration - of which SLP orally suctioned to remove.  Pt only orally transited 2 boluses of approx. 10 offered.  He did not form labial seal on straw nor spoon.   ? ?Nectar thick and pudding barium only consistencies that he transited into pharynx and they essentially spilled into pharynx.  Delayed swallow initiation due to sensory deficit observed with pt pooling secretions and barium at vallecular region with puree - to pyriform with nectar prior to swallow.  Trace laryngeal penetration of nectar observed from pyriform sinus.  Pharyngeal swallow with puree was strong without retention.   ? ?Fortunately, MBS provided useful information re: his swallow function regarding pharyngeal function.  SLP questions impact of barium on his participation *despite SLP adding sweetener to barium*.    ? ?Pt did present with congested cough at end of MBS - concern for barium aspiration that mixed with secretions and SlP was able to orally suction minimal amount of viscous secretions from posterior oral cavity. Pt reported improved comfort afterward.   ? ?Will follow up with pt clinically to combine MBS findings with clinical observations re: his dysphagia - but aspiration will not be prevented in this patient- and he is overtly aspirating secretions. Would recommend a palliative consult *if not initiated yet.   ? ?Given progressive medical decline, prognosis for swallow function to return to adequate level is guarded.  Comfort feeds if family desires may be a reasonable care plan for this most unfortunate pt. ?  ?Swallow Evaluation Recommendations ? ? Recommended Consults: Other (Comment) ? ? SLP Diet Recommendations: NPO;NPO except meds;Ice chips PRN after oral care ?Consider comfort feeds if family/md agreeable ?   ? ? Medication Administration: Via alternative means ? ?   ? ? Compensations: Slow rate;Small sips/bites;Other (Comment) (assure pt swallows prior to giving more po due to prominent delay) ? ?   ? ? Oral Care Recommendations: Oral care QID ? ? Other Recommendations: Have oral suction available ? ? ?Rolena Infante, MS CCC SLP ?Acute Rehab Services ?Office [phone] ?Pager [phone] ? ?Chales Abrahams ?05/18/2021,9:37 AM ?

## 2021-05-19 DIAGNOSIS — I10 Essential (primary) hypertension: Secondary | ICD-10-CM | POA: Diagnosis not present

## 2021-05-19 DIAGNOSIS — J189 Pneumonia, unspecified organism: Secondary | ICD-10-CM | POA: Diagnosis not present

## 2021-05-19 DIAGNOSIS — J690 Pneumonitis due to inhalation of food and vomit: Secondary | ICD-10-CM | POA: Diagnosis not present

## 2021-05-19 DIAGNOSIS — Z7189 Other specified counseling: Secondary | ICD-10-CM

## 2021-05-19 DIAGNOSIS — Z515 Encounter for palliative care: Secondary | ICD-10-CM

## 2021-05-19 DIAGNOSIS — N179 Acute kidney failure, unspecified: Secondary | ICD-10-CM | POA: Diagnosis not present

## 2021-05-19 DIAGNOSIS — F03918 Unspecified dementia, unspecified severity, with other behavioral disturbance: Secondary | ICD-10-CM | POA: Diagnosis not present

## 2021-05-19 LAB — COMPREHENSIVE METABOLIC PANEL
ALT: 16 U/L (ref 0–44)
AST: 18 U/L (ref 15–41)
Albumin: 2.4 g/dL — ABNORMAL LOW (ref 3.5–5.0)
Alkaline Phosphatase: 50 U/L (ref 38–126)
Anion gap: 9 (ref 5–15)
BUN: 16 mg/dL (ref 8–23)
CO2: 21 mmol/L — ABNORMAL LOW (ref 22–32)
Calcium: 8.3 mg/dL — ABNORMAL LOW (ref 8.9–10.3)
Chloride: 112 mmol/L — ABNORMAL HIGH (ref 98–111)
Creatinine, Ser: 0.67 mg/dL (ref 0.61–1.24)
GFR, Estimated: >60 mL/min (ref >60)
Glucose, Bld: 55 mg/dL — ABNORMAL LOW (ref 70–99)
Potassium: 3.1 mmol/L — ABNORMAL LOW (ref 3.5–5.1)
Sodium: 142 mmol/L (ref 135–145)
Total Bilirubin: 1.3 mg/dL — ABNORMAL HIGH (ref 0.3–1.2)
Total Protein: 5.5 g/dL — ABNORMAL LOW (ref 6.5–8.1)

## 2021-05-19 LAB — CBC
HCT: 28.4 % — ABNORMAL LOW (ref 39.0–52.0)
Hemoglobin: 9.4 g/dL — ABNORMAL LOW (ref 13.0–17.0)
MCH: 28.5 pg (ref 26.0–34.0)
MCHC: 33.1 g/dL (ref 30.0–36.0)
MCV: 86.1 fL (ref 80.0–100.0)
Platelets: 167 10*3/uL (ref 150–400)
RBC: 3.3 MIL/uL — ABNORMAL LOW (ref 4.22–5.81)
RDW: 17.8 % — ABNORMAL HIGH (ref 11.5–15.5)
WBC: 11.2 10*3/uL — ABNORMAL HIGH (ref 4.0–10.5)
nRBC: 0 % (ref 0.0–0.2)

## 2021-05-19 LAB — MAGNESIUM: Magnesium: 1.9 mg/dL (ref 1.7–2.4)

## 2021-05-19 MED ORDER — POTASSIUM CHLORIDE 10 MEQ/100ML IV SOLN
10.0000 meq | INTRAVENOUS | Status: AC
  Administered 2021-05-19 (×6): 10 meq via INTRAVENOUS
  Filled 2021-05-19 (×6): qty 100

## 2021-05-19 MED ORDER — ACETAMINOPHEN 650 MG RE SUPP
650.0000 mg | Freq: Once | RECTAL | Status: DC
  Filled 2021-05-19: qty 1

## 2021-05-19 NOTE — Progress Notes (Signed)
?  Progress Note ? ? ?Patient: Johnathan Mitchell. IZ:[id number] DOB: Sep 13, 1934 DOA: 05/16/2021     3 ?DOS: the patient was seen and examined on 05/19/2021 ?  ?Brief hospital course: ?87 y.o. male with medical history significant of aspiration PNA, HTN, seizures, asthma, prior CVA who presents from facility with concerns of recurrent aspiration PNA. Pt is currently unable to provide his own history. Per report, pt was recently admitted for PNA and discharged to SNF. Pt noted to be acutely hypoxemic with O2 sats down to 78% on RA, requiring supplemental O2. On presentation to ED, pt had CXR performed which demonstrated hazy opacities in the B lung bases, reviewed. Pt was given dose of rocephin and hospitalist consulted for consideration for admission ? ?Assessment and Plan: ?* Aspiration pneumonia ([place]) ?-CXR reviewed, findings concerning for B infiltrates ?-brownish secretions suctioned out in ED with resultant improvement in O2 requirments ?-Concerns for aspiration. ?-Pt was evaluated by SLP in 2/23, recs noted for dysphagia 2 diet with thin liquids at that time, with concerns for mild-mod aspiration risk ?-Given concerns of aspiration, consulted SLP. Appreciate recs, noted for continued NPO status, alternative means for medications ?-Pt has PCN allergy documented, but has historically received cephalosporins in past. Will cont on azithro plus rocephin ?-discussed with SLP. Pt failed MBS ?-Given recurrent aspiration in setting of baseline dementia, have consulted Palliative Care to establish goals of care, consideration for possible comfort feeds ? ?Seizure ([place]) ?-No evidence of seizure activity at this time ?-Would cont on IV keppra for now ? ?AKI (acute kidney injury) ([place]) ?-Presenting Cr of 1.58, baseline Cr <1 ?-Cr normalized with IVF ?-Recheck bmet in AM ? ?Dementia with behavioral disturbance ([place]) ?-seems more conversant ?-Cont to monitor for now ?-Have consulted PT/OT, recs were noted for SNF ? ?Benign  essential HTN ?-BP stable at this time ?-Cont on PRN hydralazine as needed ? ?Cerebral infarction [facility]) ?-Seems stable at this time ? ? ?  ? ?Subjective: States feeling well ? ?Physical Exam: ?Vitals:  ? 05/18/21 0600 05/18/21 2021 05/19/21 0535 05/19/21 1414  ?BP:  (!) 161/83 (!) 144/77 (!) 129/93  ?Pulse:  71 68 60  ?Resp: 19 18 18 14   ?Temp:  98.1 ?F (36.7 ?C) 98.2 ?F (36.8 ?C) 97.9 ?F (36.6 ?C)  ?TempSrc:  Oral Oral Oral  ?SpO2:  98% 95% 100%  ?Weight:      ?Height:      ? ?General exam: Awake, laying in bed, in nad ?Respiratory system: Normal respiratory effort, no wheezing ?Cardiovascular system: regular rate, s1, s2 ?Gastrointestinal system: Soft, nondistended, positive BS ?Central nervous system: CN2-12 grossly intact, strength intact ?Extremities: Perfused, no clubbing ?Skin: Normal skin turgor, no notable skin lesions seen ?Psychiatry: Mood normal // no visual hallucinations  ? ?Data Reviewed: ? ?Labs reviewed: K 3.1, Cr 0.67 ? ?Family Communication: Pt in room, family not at bedside ? ?Disposition: ?Status is: Inpatient ?Remains inpatient appropriate because: Severity of illness ? Planned Discharge Destination: Skilled nursing facility ? ? ? ? ?Author: ?Marylu Lund, MD ?05/19/2021 4:48 PM ? ?For on call review [email or web address]  ?

## 2021-05-20 DIAGNOSIS — Z515 Encounter for palliative care: Secondary | ICD-10-CM

## 2021-05-20 DIAGNOSIS — Z7189 Other specified counseling: Secondary | ICD-10-CM | POA: Diagnosis not present

## 2021-05-20 DIAGNOSIS — F03918 Unspecified dementia, unspecified severity, with other behavioral disturbance: Secondary | ICD-10-CM | POA: Diagnosis not present

## 2021-05-20 DIAGNOSIS — J690 Pneumonitis due to inhalation of food and vomit: Secondary | ICD-10-CM | POA: Diagnosis not present

## 2021-05-20 DIAGNOSIS — I10 Essential (primary) hypertension: Secondary | ICD-10-CM | POA: Diagnosis not present

## 2021-05-20 LAB — COMPREHENSIVE METABOLIC PANEL
ALT: 15 U/L (ref 0–44)
AST: 16 U/L (ref 15–41)
Albumin: 2.4 g/dL — ABNORMAL LOW (ref 3.5–5.0)
Alkaline Phosphatase: 53 U/L (ref 38–126)
Anion gap: 10 (ref 5–15)
BUN: 11 mg/dL (ref 8–23)
CO2: 20 mmol/L — ABNORMAL LOW (ref 22–32)
Calcium: 8.2 mg/dL — ABNORMAL LOW (ref 8.9–10.3)
Chloride: 115 mmol/L — ABNORMAL HIGH (ref 98–111)
Creatinine, Ser: 0.57 mg/dL — ABNORMAL LOW (ref 0.61–1.24)
GFR, Estimated: >60 mL/min (ref >60)
Glucose, Bld: 58 mg/dL — ABNORMAL LOW (ref 70–99)
Potassium: 3.3 mmol/L — ABNORMAL LOW (ref 3.5–5.1)
Sodium: 145 mmol/L (ref 135–145)
Total Bilirubin: 1.4 mg/dL — ABNORMAL HIGH (ref 0.3–1.2)
Total Protein: 5.5 g/dL — ABNORMAL LOW (ref 6.5–8.1)

## 2021-05-20 LAB — CBC
HCT: 30.3 % — ABNORMAL LOW (ref 39.0–52.0)
Hemoglobin: 9.6 g/dL — ABNORMAL LOW (ref 13.0–17.0)
MCH: 27.9 pg (ref 26.0–34.0)
MCHC: 31.7 g/dL (ref 30.0–36.0)
MCV: 88.1 fL (ref 80.0–100.0)
Platelets: 161 10*3/uL (ref 150–400)
RBC: 3.44 MIL/uL — ABNORMAL LOW (ref 4.22–5.81)
RDW: 17.8 % — ABNORMAL HIGH (ref 11.5–15.5)
WBC: 11 10*3/uL — ABNORMAL HIGH (ref 4.0–10.5)
nRBC: 0 % (ref 0.0–0.2)

## 2021-05-20 MED ORDER — DEXTROSE-NACL 5-0.9 % IV SOLN: INTRAVENOUS | Status: DC

## 2021-05-20 NOTE — Consult Note (Signed)
? ?                                                                                ?Consultation Note ?Date: 05/20/2021  ? ?Patient Name: Johnathan Mitchell.  ?DOB: 04-Oct-1934  MRN: [id number]  Age / Sex: 87 y.o., male  ?[facility] ?Referring Physician: Donne Hazel, MD ? ?Reason for Consultation: Establishing goals of care ? ?HPI/Patient Profile: 87 y.o. male  with past medical history of aspiration pneumonia, hypertension, seizures, asthma, prior CVA admitted on 05/16/2021 with aspiration pneumonia.  Palliative consult for goals of care. ? ?Clinical Assessment and Goals of Care: ?I met today with Mr Haggart.  ? ?I introduced palliative care as specialized medical care for people living with serious illness. It focuses on providing relief from the symptoms and stress of a serious illness. The goal is to improve quality of life for both the patient and the family. ? ?I met first today with patient.  He was lying in bed in no distress.  He was awake and alert and was engage in some socially appropriate conversation.  He very much seems to minimize any concerns about his medical situation and it asking for him to repeat back with me about his understanding of situation I am concerned he does not really have any insight into what is going on. ? ?I then called and was able to reach his daughter, Narda Rutherford. ? ?We discussed clinical course as well as wishes moving forward in regard to advanced directives.  Concepts specific to code status and rehospitalization discussed.  We discussed aspiration event and also attempted to discuss overall changes with decline in nutrition and functional status.  We discussed specifically concerned about aspiration being recurrent and that this is not a long-term "fixable" problem with his underlying dementia.  We discussed difference between a aggressive medical intervention path and a palliative, comfort focused care path.  Values and goals of care important to patient and  family were attempted to be elicited. ?  ?Concept of Hospice and Palliative Care were discussed ?  ?Questions and concerns addressed.   PMT will continue to support holistically. ?  ?SUMMARY OF RECOMMENDATIONS   ?- Full code/full scope ?-Continue current care.  Discussion today with patient (who minimizes his overall medical problems) and later his daughter via phone regarding concerns for recurrent aspiration and natural progression/trajectory of dementia.  His daughter was open to conversation and we discussed continuing to see how he does over the next day or 2 as his mental status does appear to be somewhat improving today.  Discussed that I am not sure this is going to translate into meaningful improvement in his swallow function as he was grossly aspirating. ?-Daughter appreciative of updates and open to further conversation based upon his clinical course. ? ? ?Additional Recommendations (Limitations, Scope, Preferences): ?Full Scope Treatment ? ?Psycho-social/Spiritual:  ?Desire for further Chaplaincy support: Did not address today ?Additional Recommendations: Caregiving  Support/Resources and Education on Hospice ? ?Prognosis:  ?Guarded ? ?Discharge Planning: To Be Determined  ? ?  ? ?Primary Diagnoses: ?Present on Admission: ? Aspiration pneumonia ([place]) ? AKI (acute kidney injury) ([place]) ? Benign essential HTN ?  Cerebral infarction [facility]) ? Dementia with behavioral disturbance ([place]) ? Hypokalemia ? ? ?I have reviewed the medical record, interviewed the patient and family, and examined the patient. The following aspects are pertinent. ? ?Past Medical History:  ?Diagnosis Date  ? Asthma   ? Dementia ([place])   ? Gout   ? Hypertension   ? Seizures ([place])   ? Stroke [facility])   ? ?Social History  ? ?Socioeconomic History  ? Marital status: Single  ?  Spouse name: Not on file  ? Number of children: Not on file  ? Years of education: Not on file  ? Highest education level: Not on file  ?Occupational History  ? Not on file   ?Tobacco Use  ? Smoking status: Former  ? Smokeless tobacco: Never  ?Vaping Use  ? Vaping Use: Never used  ?Substance and Sexual Activity  ? Alcohol use: No  ? Drug use: No  ? Sexual activity: Not on file  ?Other Topics Concern  ? Not on file  ?Social History Narrative  ? Not on file  ? ?Social Determinants of Health  ? ?Financial Resource Strain: Not on file  ?Food Insecurity: Not on file  ?Transportation Needs: Not on file  ?Physical Activity: Not on file  ?Stress: Not on file  ?Social Connections: Not on file  ? ?Family History  ?Problem Relation Age of Onset  ? Hyperlipidemia Mother   ? Hypertension Mother   ? ?Scheduled Meds: ? acetaminophen  650 mg Rectal Once  ? chlorhexidine  15 mL Mouth Rinse BID  ? enoxaparin (LOVENOX) injection  40 mg Subcutaneous Q24H  ? ?Continuous Infusions: ? sodium chloride 75 mL/hr at 05/19/21 1641  ? azithromycin 500 mg (05/19/21 1821)  ? cefTRIAXone (ROCEPHIN)  IV 2 g (05/19/21 2115)  ? levETIRAcetam 500 mg (05/20/21 0515)  ? ?PRN Meds:.acetaminophen **OR** acetaminophen, hydrALAZINE ?Medications Prior to Admission:  ?Prior to Admission medications   ?Medication Sig Start Date End Date Taking? Authorizing Provider  ?acetaminophen (TYLENOL) 500 MG tablet Take 1 tablet (500 mg total) by mouth every 8 (eight) hours. ?Patient taking differently: Take 1,000 mg by mouth 3 (three) times daily. 12/20/19  Yes Mercy Riding, MD  ?Amino Acids-Protein Hydrolys (FEEDING SUPPLEMENT, PRO-STAT SUGAR FREE 64,) LIQD Take 30 mLs by mouth 2 (two) times daily.   Yes [provider]  ?atorvastatin (LIPITOR) 80 MG tablet Take 1 tablet (80 mg total) by mouth daily at 6 PM. ?Patient taking differently: Take 80 mg by mouth at bedtime. 08/20/17  Yes Medina-Vargas, Monina C, NP  ?Baclofen 5 MG TABS Take 7.5 mg by mouth 3 (three) times daily.   Yes [provider]  ?busPIRone (BUSPAR) 5 MG tablet Take 2.5 mg by mouth 2 (two) times daily.   Yes [provider]  ?Cholecalciferol 25  MCG (1000 UT) capsule Take 1,000 Units by mouth daily.   Yes [provider]  ?diclofenac Sodium (VOLTAREN) 1 % GEL Apply 2 g topically 2 (two) times daily as needed for pain. 08/16/20  Yes [provider]  ?gabapentin (NEURONTIN) 100 MG capsule Take 100 mg by mouth 2 (two) times daily.   Yes [provider]  ?levETIRAcetam (KEPPRA) 500 MG tablet Take 1 tablet (500 mg total) by mouth 2 (two) times daily. 08/08/20  Yes Lacretia Leigh, MD  ?LORazepam (ATIVAN) 0.5 MG tablet Take 0.5 mg by mouth every 8 (eight) hours.   Yes [provider]  ?Multiple Vitamin (MULTIVITAMIN WITH MINERALS) TABS tablet Take 1 tablet  by mouth daily.   Yes [provider]  ?potassium chloride (KLOR-CON M) 10 MEQ tablet Take 10 mEq by mouth daily.   Yes [provider]  ?predniSONE (DELTASONE) 10 MG tablet Take 20 mg by mouth daily with breakfast.   Yes [provider]  ?silver sulfADIAZINE (SILVADENE) 1 % cream Apply 1 application. topically 2 (two) times daily. Apply to buttock   Yes [provider]  ?vitamin B-12 (CYANOCOBALAMIN) 1000 MCG tablet Take 1,000 mcg by mouth daily.   Yes [provider]  ?aspirin EC 81 MG tablet Take 1 tablet (81 mg total) by mouth daily. Swallow whole. ?Patient not taking: Reported on 05/16/2021 03/15/21 03/15/22  Darliss Cheney, MD  ?potassium chloride SA (KLOR-CON M) 20 MEQ tablet Take 1 tablet (20 mEq total) by mouth daily. ?Patient not taking: Reported on 05/16/2021 03/15/21 04/14/21  Darliss Cheney, MD  ? ?Allergies  ?Allergen Reactions  ? Penicillins Hives and Swelling  ? Valproate Sodium   ?  Other reaction(s): Other (See Comments)  ? Lexapro [Escitalopram] Diarrhea  ? ?Review of Systems ?Currently denies complaints.  Minimizes his situation overall ? ?Physical Exam ?General: Alert, awake, in no acute distress.   ?HEENT: No bruits, no goiter, no JVD ?Heart: Regular rate and rhythm. No murmur appreciated. ?Lungs: Good air movement,  clear ?Abdomen: Soft, nontender, nondistended, positive bowel sounds.   ?Ext: No significant edema ?Skin: Warm and dry ?Neuro: Grossly intact, nonfocal. ? ? ?Vital Signs: BP (!) 144/70 (BP Location: Left Leg)

## 2021-05-20 NOTE — Progress Notes (Signed)
?  Progress Note ? ? ?Patient: Johnathan Mitchell. IZ:[id number] DOB: Jul 19, 1934 DOA: 05/16/2021     4 ?DOS: the patient was seen and examined on 05/20/2021 ?  ?Brief hospital course: ?87 y.o. male with medical history significant of aspiration PNA, HTN, seizures, asthma, prior CVA who presents from facility with concerns of recurrent aspiration PNA. Pt is currently unable to provide his own history. Per report, pt was recently admitted for PNA and discharged to SNF. Pt noted to be acutely hypoxemic with O2 sats down to 78% on RA, requiring supplemental O2. On presentation to ED, pt had CXR performed which demonstrated hazy opacities in the B lung bases, reviewed. Pt was given dose of rocephin and hospitalist consulted for consideration for admission ? ?Assessment and Plan: ?* Aspiration pneumonia ([place]) ?-CXR reviewed, findings concerning for B infiltrates ?-brownish secretions suctioned out in ED with resultant improvement in O2 requirments ?-Concerns for aspiration. ?-Pt was evaluated by SLP in 2/23, recs noted for dysphagia 2 diet with thin liquids at that time, with concerns for mild-mod aspiration risk ?-Pt has PCN allergy documented, but has historically received cephalosporins in past. Will cont on azithro plus rocephin ?-Dscussed with SLP. Pt failed MBS, most recent recs for continued NPO status ?-Given recurrent aspiration in setting of baseline dementia, have consulted Palliative Care to establish goals of care. Recs to continue full scope for now and to see how pt does in the next day or two ? ?Seizure ([place]) ?-No evidence of seizure activity at this time ?-Would cont on IV keppra for now ? ?AKI (acute kidney injury) ([place]) ?-Presenting Cr of 1.58, baseline Cr <1 ?-Cr normalized with IVF ?-Recheck bmet in AM ? ?Dementia with behavioral disturbance ([place]) ?-seems more conversant and awake ?-Cont to monitor for now ?-Have consulted PT/OT, recs were noted for SNF ? ?Benign essential HTN ?-BP stable at this  time ?-Cont on PRN hydralazine as needed ? ?Cerebral infarction [facility]) ?-Seems stable at this time ? ? ?  ? ?Subjective: Without complaints this AM. Seems more awake and conversant ? ?Physical Exam: ?Vitals:  ? 05/19/21 1414 05/19/21 2055 05/20/21 0557 05/20/21 1331  ?BP: (!) 129/93 136/79 (!) 144/70 111/78  ?Pulse: 60 (!) 59 (!) 59 (!) 55  ?Resp: 14 18 18 19   ?Temp: 97.9 ?F (36.6 ?C) 98.2 ?F (36.8 ?C) 97.7 ?F (36.5 ?C) 97.7 ?F (36.5 ?C)  ?TempSrc: Oral Oral Oral Oral  ?SpO2: 100% 100% 97% 100%  ?Weight:      ?Height:      ? ?General exam: Conversant, in no acute distress ?Respiratory system: normal chest rise, clear, no audible wheezing ?Cardiovascular system: regular rhythm, s1-s2 ?Gastrointestinal system: Nondistended, nontender, pos BS ?Central nervous system: No seizures, no tremors ?Extremities: No cyanosis, no joint deformities ?Skin: No rashes, no pallor ?Psychiatry: Affect normal // no auditory hallucinations  ? ?Data Reviewed: ? ?Labs reviewed: K 3.3, Cr 0.57 ? ?Family Communication: Pt in room, family not at bedside ? ?Disposition: ?Status is: Inpatient ?Remains inpatient appropriate because: Severity of illness ? Planned Discharge Destination: Skilled nursing facility ? ? ? ? ?Author: ?Marylu Lund, MD ?05/20/2021 5:05 PM ? ?For on call review [email or web address]  ?

## 2021-05-20 NOTE — Progress Notes (Signed)
? ?                                                                                                                                                     ?                                                   ?Daily Progress Note  ? ?Patient Name: Johnathan InchesJames C Goldring Jr.       Date: 05/20/2021 ?DOB: Jun 12, 1934  Age: 87 y.o. MRN#: [id number] ?Attending Physician: Jerald Kiefhiu, Stephen K, MD ?Primary Care Physician: [facility] ?Admit Date: 05/16/2021 ? ?Reason for Consultation/Follow-up: Establishing goals of care ? ?Subjective: ?I saw and examined Johnathan Mitchell this afternoon.  He was sitting in bed in no distress.  He was alert and spoke some with me but continues to minimize any conversation regarding his medical problems. ? ?I called and spoke with his daughter, Asher MuirJamie.  She reports that she was up to see her father earlier this morning and he was a little weak with speech but she felt that his mental status seemed to be improving. ? ?She is concerned about the fact that he has not able to have anything by mouth and we talked again about concerns for aspiration.  Discussed consideration for comfort feeds. ? ?With his improvement in mental status, Asher MuirJamie would like to see if he can be evaluated again by speech therapy and then make decision about next steps. ? ?Length of Stay: 4 ? ?Current Medications: ?Scheduled Meds:  ?? acetaminophen  650 mg Rectal Once  ?? chlorhexidine  15 mL Mouth Rinse BID  ?? enoxaparin (LOVENOX) injection  40 mg Subcutaneous Q24H  ? ? ?Continuous Infusions: ?? sodium chloride 75 mL/hr at 05/19/21 1641  ?? azithromycin 500 mg (05/20/21 1704)  ?? cefTRIAXone (ROCEPHIN)  IV 2 g (05/19/21 2115)  ?? levETIRAcetam 500 mg (05/20/21 0515)  ? ? ?PRN Meds: ?acetaminophen **OR** acetaminophen, hydrALAZINE ? ?Physical Exam         ?HEENT: No bruits, no goiter, no JVD ?Heart: Regular rate and rhythm. No murmur appreciated. ?Lungs: Good air movement, clear ?Abdomen: Soft, nontender, nondistended, positive bowel  sounds.   ?Ext: No significant edema ?Skin: Warm and dry ?Neuro: Grossly intact, nonfocal. ? ?Vital Signs: BP 111/78 (BP Location: Left Arm)   Pulse (!) 55   Temp 97.7 ?F (36.5 ?C) (Oral)   Resp 19   Ht 5\' 6"  (1.676 m)   Wt 55.2 kg   SpO2 100%   BMI 19.64 kg/m?  ?SpO2: SpO2: 100 % ?O2 Device: O2 Device: Nasal Cannula ?O2 Flow Rate: O2 Flow Rate (L/min): 2 L/min ? ?Intake/output summary:  ?Intake/Output Summary (Last 24  hours) at 05/20/2021 1746 ?Last data filed at 05/20/2021 1323 ?Gross per 24 hour  ?Intake 6139.69 ml  ?Output 550 ml  ?Net 5589.69 ml  ? ?LBM: Last BM Date : 05/17/21 ?Baseline Weight: Weight: 56 kg ?Most recent weight: Weight: 55.2 kg ? ?     ?Palliative Assessment/Data: ? ? ? ?Flowsheet Rows   ? ?Flowsheet Row Most Recent Value  ?Intake Tab   ?Referral Department Hospitalist  ?Unit at Time of Referral Med/Surg Unit  ?Palliative Care Primary Diagnosis Neurology  ?Date Notified 05/17/21  ?Palliative Care Type New Palliative care  ?Reason for referral Clarify Goals of Care  ?Date of Admission 05/16/21  ?Date first seen by Palliative Care 05/19/21  ?# of days Palliative referral response time 2 Day(s)  ?# of days IP prior to Palliative referral 1  ?Clinical Assessment   ?Palliative Performance Scale Score 50%  ?Psychosocial & Spiritual Assessment   ?Palliative Care Outcomes   ?Patient/Family meeting held? Yes  ?Who was at the meeting? Daughter via phone  ?Palliative Care Outcomes Clarified goals of care  ? ?  ? ? ?Patient Active Problem List  ? Diagnosis Date Noted  ?? Hypophosphatemia 03/13/2021  ?? HCAP (healthcare-associated pneumonia) 03/13/2021  ?? Goals of care, counseling/discussion   ?? Palliative care by specialist   ?? Aspiration pneumonia (H[facility]) 05/16/2021  ?? Pressure injury of skin 03/13/2021  ?? Hypokalemia 03/12/2021  ?? Dehydration 03/12/2021  ?? Prolonged QT interval 03/12/2021  ?? Generalized weakness 03/12/2021  ?? Seizure (HCC) 03/12/2021  ?? Cellulitis of left hand 12/15/2019   ?? Wrist pain 12/15/2019  ?? Closed left acetabular fracture (HCC) 12/14/2019  ?? SIRS (systemic inflammatory response syndrome) (HCC) 12/14/2019  ?? Protein-calorie malnutrition (HCC) 05/20/2017  ?? TIA (transient ischemic attack) 05/20/2017  ?? 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 AKI (acute kidney injury) (H[facility]) 05/12/2017  ?? Stroke (HCC) 05/11/2017  ?? Femur fracture (HCC) 05/11/2017  ?? Chest pain 02/09/2016  ?? HTN (hypertension) 02/09/2016  ?? Dementia with behavioral disturbance (HCC) 02/09/2016  ?? Chest pain at rest 02/09/2016  ?? Cerebral infarction (HCC) 02/01/2014  ?? Benign essential HTN 02/01/2014  ? ? ?Palliative Care Assessment & Plan  ? ?Patient Profile: ?87 y.o. male  with past medical history of aspiration pneumonia, hypertension, seizures, asthma, prior CVA admitted on 05/16/2021 with aspiration pneumonia.  Palliative consult for goals of care. ? ?Recommendations/Plan: ?Full code/full scope ?Continue discussion with patient's daughter regarding concerns for recurrent aspiration and natural progression/trajectory of dementia.  She reports that he seems more awake and alert today and she is requesting reevaluation by speech therapy to see if there is any improvement in his swallow function. ? ?Goals of Care and Additional Recommendations: ?Limitations on Scope of Treatment: Full Scope Treatment ? ?Code Status: ? ?  ?Code Status Orders  ?(From admission, onward)  ?  ? ? ?  ? ?  Start     Ordered  ? 05/16/21 1721  Full code  Continuous       ? 05/16/21 1727  ? ?  ?  ? ?  ? ?Code Status History   ? ? Date Active Date Inactive Code Status Order ID Comments User Context  ? 03/12/2021 0409 03/15/2021 1924 Full Code [id number]  Angie Fava, DO ED  ? 12/14/2019 1842 12/20/2019 1816 Full Code [id number]  Bobette Mo, MD ED  ? 12/14/2019 1842 12/14/2019 1842 Full Code [id number]  Bobette Mo, MD ED  ? 05/11/2017 2146 05/16/2017 2308 Full Code [id number]  Pearson Grippe, MD Inpatient  ?  02/01/2014 1726 02/02/2014 2133 Full Code [id number]  Maretta Bees, MD ED  ? ?  ? ? ?Prognosis: ? Unable to determine ? ?Discharge Planning: ?To Be Determined ? ?Care plan was discussed with daughter ? ?Thank you for allowing the Palliative Medicine Team to assist in the care of this patient. ? ? ?Total time: 30 minutes ?Romie Minus, MD ? ?Please contact Palliative Medicine Team phone at [phone] for questions and concerns.  ? ? ? ? ? ?

## 2021-05-21 ENCOUNTER — Inpatient Hospital Stay (HOSPITAL_COMMUNITY): Payer: No Typology Code available for payment source

## 2021-05-21 DIAGNOSIS — Z7189 Other specified counseling: Secondary | ICD-10-CM | POA: Diagnosis not present

## 2021-05-21 DIAGNOSIS — F03918 Unspecified dementia, unspecified severity, with other behavioral disturbance: Secondary | ICD-10-CM | POA: Diagnosis not present

## 2021-05-21 DIAGNOSIS — I10 Essential (primary) hypertension: Secondary | ICD-10-CM | POA: Diagnosis not present

## 2021-05-21 DIAGNOSIS — J690 Pneumonitis due to inhalation of food and vomit: Secondary | ICD-10-CM | POA: Diagnosis not present

## 2021-05-21 DIAGNOSIS — J189 Pneumonia, unspecified organism: Secondary | ICD-10-CM | POA: Diagnosis not present

## 2021-05-21 LAB — COMPREHENSIVE METABOLIC PANEL
ALT: 13 U/L (ref 0–44)
AST: 13 U/L — ABNORMAL LOW (ref 15–41)
Albumin: 2.2 g/dL — ABNORMAL LOW (ref 3.5–5.0)
Alkaline Phosphatase: 48 U/L (ref 38–126)
Anion gap: 10 (ref 5–15)
BUN: 8 mg/dL (ref 8–23)
CO2: 22 mmol/L (ref 22–32)
Calcium: 8.1 mg/dL — ABNORMAL LOW (ref 8.9–10.3)
Chloride: 115 mmol/L — ABNORMAL HIGH (ref 98–111)
Creatinine, Ser: 0.48 mg/dL — ABNORMAL LOW (ref 0.61–1.24)
GFR, Estimated: >60 mL/min (ref >60)
Glucose, Bld: 117 mg/dL — ABNORMAL HIGH (ref 70–99)
Potassium: 3 mmol/L — ABNORMAL LOW (ref 3.5–5.1)
Sodium: 147 mmol/L — ABNORMAL HIGH (ref 135–145)
Total Bilirubin: 1.1 mg/dL (ref 0.3–1.2)
Total Protein: 5.3 g/dL — ABNORMAL LOW (ref 6.5–8.1)

## 2021-05-21 LAB — CBC
HCT: 30 % — ABNORMAL LOW (ref 39.0–52.0)
Hemoglobin: 9.6 g/dL — ABNORMAL LOW (ref 13.0–17.0)
MCH: 28.2 pg (ref 26.0–34.0)
MCHC: 32 g/dL (ref 30.0–36.0)
MCV: 88.2 fL (ref 80.0–100.0)
Platelets: 167 10*3/uL (ref 150–400)
RBC: 3.4 MIL/uL — ABNORMAL LOW (ref 4.22–5.81)
RDW: 17.5 % — ABNORMAL HIGH (ref 11.5–15.5)
WBC: 8.9 10*3/uL (ref 4.0–10.5)
nRBC: 0 % (ref 0.0–0.2)

## 2021-05-21 LAB — CULTURE, BLOOD (ROUTINE X 2)
Culture: NO GROWTH
Culture: NO GROWTH
Special Requests: ADEQUATE
Special Requests: ADEQUATE

## 2021-05-21 NOTE — Progress Notes (Signed)
? ?                                                                                                                                                     ?                                                   ?Daily Progress Note  ? ?Patient Name: Johnathan Mitchell.       Date: 05/21/2021 ?DOB: [birth year]  Age: 87 y.o. MRN#: [id number] ?Attending Physician: Jerald Kief, MD ?Primary Care Physician: Administration, Veterans ?Admit Date: 05/16/2021 ? ?Reason for Consultation/Follow-up: Establishing goals of care ? ?Subjective: ?I saw and examined Johnathan Mitchell this afternoon.   ? ?Diet was advanced to dysphagia diet after MBS this morning and at time of my encounter he was working on [employer].  He had cleaned most of his plate by that time. ? ?He denies any complaints or concerns today.  Still does not really participate in any conversations regarding longer-term goals. ? ?I called and spoke with his daughter, Asher Muir.  Asher Muir and I discussed that he is back on dysphagia diet and she was happy to hear that he did better the swallowing evaluation this morning.  We continued discussion regarding recurrent aspiration and I expressed to her my concern that this is a problem that would likely occur again at some point in the future.  While we do not know the timing of recurrence, we do know that this has been a repeated problem. ? ?We also discussed considering limitations of care such as CPR or mechanical ventilation in the event of cardiac or respiratory arrest.  Asher Muir reports that she is processing the fact that her dad continues to lose nutrition and functional status and she does not want him to suffer moving forward.  Discussed plan for transition back to facility and mention was made of possible hospice support in the future. ? ?Length of Stay: 5 ? ?Current Medications: ?Scheduled Meds:  ?? acetaminophen  650 mg Rectal Once  ?? chlorhexidine  15 mL Mouth Rinse BID  ?? enoxaparin (LOVENOX) injection  40 mg Subcutaneous  Q24H  ? ? ?Continuous Infusions: ?? dextrose 5 % and 0.9% NaCl 75 mL/hr at 05/21/21 1042  ?? levETIRAcetam 500 mg (05/21/21 0962)  ? ? ?PRN Meds: ?acetaminophen **OR** acetaminophen, hydrALAZINE ? ?Physical Exam         ?HEENT: No bruits, no goiter, no JVD ?Heart: Regular rate and rhythm. No murmur appreciated. ?Lungs: Good air movement, clear ?Abdomen: Soft, nontender, nondistended, positive bowel sounds.   ?Ext: No significant edema ?Skin: Warm and dry ?Neuro: Grossly intact, nonfocal. ? ?Vital Signs: BP 117/82 (  BP Location: Left Arm)   Pulse 84   Temp (!) 97.5 ?F (36.4 ?C) (Oral)   Resp 18   Ht 5\' 6"  (1.676 m)   Wt 55.2 kg   SpO2 99%   BMI 19.64 kg/m?  ?SpO2: SpO2: 99 % ?O2 Device: O2 Device: Nasal Cannula ?O2 Flow Rate: O2 Flow Rate (L/min): 2 L/min ? ?Intake/output summary:  ?Intake/Output Summary (Last 24 hours) at 05/21/2021 1622 ?Last data filed at 05/21/2021 1330 ?Gross per 24 hour  ?Intake 240 ml  ?Output 500 ml  ?Net -260 ml  ? ? ?LBM: Last BM Date : 05/17/21 ?Baseline Weight: Weight: 56 kg ?Most recent weight: Weight: 55.2 kg ? ?     ?Palliative Assessment/Data: ? ? ? ?Flowsheet Rows   ? ?Flowsheet Row Most Recent Value  ?Intake Tab   ?Referral Department Hospitalist  ?Unit at Time of Referral Med/Surg Unit  ?Palliative Care Primary Diagnosis Neurology  ?Date Notified 05/17/21  ?Palliative Care Type New Palliative care  ?Reason for referral Clarify Goals of Care  ?Date of Admission 05/16/21  ?Date first seen by Palliative Care 05/19/21  ?# of days Palliative referral response time 2 Day(s)  ?# of days IP prior to Palliative referral 1  ?Clinical Assessment   ?Palliative Performance Scale Score 50%  ?Psychosocial & Spiritual Assessment   ?Palliative Care Outcomes   ?Patient/Family meeting held? Yes  ?Who was at the meeting? Daughter via phone  ?Palliative Care Outcomes Clarified goals of care  ? ?  ? ? ?Patient Active Problem List  ? Diagnosis Date Noted  ?? Hypophosphatemia 03/13/2021  ?? HCAP  (healthcare-associated pneumonia) 03/13/2021  ?? Goals of care, counseling/discussion   ?? Palliative care by specialist   ?? Aspiration pneumonia (HCC) 05/16/2021  ?? Pressure injury of skin 03/13/2021  ?? Hypokalemia 03/12/2021  ?? Dehydration 03/12/2021  ?? Prolonged QT interval 03/12/2021  ?? Generalized weakness 03/12/2021  ?? Seizure (HCC) 03/12/2021  ?? Cellulitis of left hand 12/15/2019  ?? Wrist pain 12/15/2019  ?? Closed left acetabular fracture (HCC) 12/14/2019  ?? SIRS (systemic inflammatory response syndrome) (HCC) 12/14/2019  ?? Protein-calorie malnutrition (HCC) 05/20/2017  ?? TIA (transient ischemic attack) 05/20/2017  ?? 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 AKI (acute kidney injury) (HCC) 05/12/2017  ?? Stroke (HCC) 05/11/2017  ?? Femur fracture (HCC) 05/11/2017  ?? Chest pain 02/09/2016  ?? HTN (hypertension) 02/09/2016  ?? Dementia with behavioral disturbance (HCC) 02/09/2016  ?? Chest pain at rest 02/09/2016  ?? Cerebral infarction (HCC) 02/01/2014  ?? Benign essential HTN 02/01/2014  ? ? ?Palliative Care Assessment & Plan  ? ?Patient Profile: ?87 y.o. male  with past medical history of aspiration pneumonia, hypertension, seizures, asthma, prior CVA admitted on 05/16/2021 with aspiration pneumonia.  Palliative consult for goals of care. ? ?Recommendations/Plan: ?Full code/full scope ?Continue discussion with patient's daughter regarding concerns for recurrent aspiration and natural progression/trajectory of dementia.  She is happy to know that he is being fed today after having improvement on MBS, however, she also understands that this is a long-term problem that is not going to go away.  We further discussed limitations of care and introduced concept of hospice.  She would like further time to consider and see how he does on dysphagia diet but is open to follow-up tomorrow. ? ?Goals of Care and Additional Recommendations: ?Limitations on Scope of Treatment: Full Scope Treatment ? ?Code  Status: ? ?  ?Code Status Orders  ?(From admission, onward)  ?  ? ? ?  ? ?  Start     Ordered  ? 05/16/21 1721  Full code  Continuous       ? 05/16/21 1727  ? ?  ?  ? ?  ? ?Code Status History   ? ? Date Active Date Inactive Code Status Order ID Comments User Context  ? 03/12/2021 0409 03/15/2021 1924 Full Code [id number]  Angie Fava, DO ED  ? 12/14/2019 1842 12/20/2019 1816 Full Code [id number]  Bobette Mo, MD ED  ? 12/14/2019 1842 12/14/2019 1842 Full Code [id number]  Bobette Mo, MD ED  ? 05/11/2017 2146 05/16/2017 2308 Full Code [id number]  Pearson Grippe, MD Inpatient  ? 02/01/2014 1726 02/02/2014 2133 Full Code [id number]  Maretta Bees, MD ED  ? ?  ? ? ?Prognosis: ? Unable to determine ? ?Discharge Planning: ?To Be Determined ? ?Care plan was discussed with daughter ? ?Thank you for allowing the Palliative Medicine Team to assist in the care of this patient. ? ? ?Total time: 50 minutes ?Romie Minus, MD ? ?Please contact Palliative Medicine Team phone at [phone] for questions and concerns.  ? ? ? ? ? ?

## 2021-05-21 NOTE — Progress Notes (Signed)
?  Progress Note ? ? ?Patient: Johnathan Mitchell. [id number] DOB: 07-Apr-1934 DOA: 05/16/2021     5 ?DOS: the patient was seen and examined on 05/21/2021 ?  ?Brief hospital course: ?87 y.o. male with medical history significant of aspiration PNA, HTN, seizures, asthma, prior CVA who presents from facility with concerns of recurrent aspiration PNA. Pt is currently unable to provide his own history. Per report, pt was recently admitted for PNA and discharged to SNF. Pt noted to be acutely hypoxemic with O2 sats down to 78% on RA, requiring supplemental O2. On presentation to ED, pt had CXR performed which demonstrated hazy opacities in the B lung bases, reviewed. Pt was given dose of rocephin and hospitalist consulted for consideration for admission ? ?Assessment and Plan: ?* Aspiration pneumonia ([place]) ?-CXR reviewed, findings concerning for B infiltrates ?-brownish secretions suctioned out in ED with resultant improvement in O2 requirments ?-Concerns for aspiration. ?-Pt was evaluated by SLP in 2/23, recs noted for dysphagia 2 diet with thin liquids at that time, with concerns for mild-mod aspiration risk ?-Pt has PCN allergy documented, but has historically received cephalosporins in past. Will cont on azithro plus rocephin ?-Given recurrent aspiration in setting of baseline dementia, have consulted Palliative Care to establish goals of care. ?-Pt has passed swallow eval today, now on dysphagia 1 with nectar thick liquids ? ?Seizure ([place]) ?-No evidence of seizure activity at this time ?-Would cont on IV keppra for now ? ?AKI (acute kidney injury) ([place]) ?-Presenting Cr of 1.58, baseline Cr <1 ?-Cr normalized with IVF ?-recheck bmet in AM ? ?Dementia with behavioral disturbance ([place]) ?-seems more conversant and awake ?-Cont to monitor for now ?-Have consulted PT/OT, recs were noted for SNF ? ?Benign essential HTN ?-BP stable at this time ?-Cont on PRN hydralazine as needed ? ?Cerebral infarction [facility]) ?-Seems stable at  this time ? ? ?  ? ?Subjective: Without complaints this AM. Seems more awake and conversant ? ?Physical Exam: ?Vitals:  ? 05/20/21 1331 05/20/21 2104 05/21/21 0430 05/21/21 1357  ?BP: 111/78 114/73 107/66 117/82  ?Pulse: (!) 55 60 (!) 57 84  ?Resp: 19 18 16 18   ?Temp: 97.7 ?F (36.5 ?C) 98 ?F (36.7 ?C) 97.9 ?F (36.6 ?C) (!) 97.5 ?F (36.4 ?C)  ?TempSrc: Oral Oral Oral Oral  ?SpO2: 100% 98% 99%   ?Weight:      ?Height:      ? ?General exam: Conversant, in no acute distress ?Respiratory system: normal chest rise, clear, no audible wheezing ?Cardiovascular system: regular rhythm, s1-s2 ?Gastrointestinal system: Nondistended, nontender, pos BS ?Central nervous system: No seizures, no tremors ?Extremities: No cyanosis, no joint deformities ?Skin: No rashes, no pallor ?Psychiatry: Affect normal // no auditory hallucinations  ? ?Data Reviewed: ? ?Labs reviewed: K 3.3, Cr 0.57 ? ?Family Communication: Pt in room, family not at bedside ? ?Disposition: ?Status is: Inpatient ?Remains inpatient appropriate because: Severity of illness ? Planned Discharge Destination: Skilled nursing facility ? ? ? ? ?Author: ?Marylu Lund, MD ?05/21/2021 4:12 PM ? ?For on call review [email or web address].  ?

## 2021-05-21 NOTE — Progress Notes (Signed)
Speech Language Pathology Treatment: Dysphagia  ?Patient Details ?Name: Johnathan Mitchell. ?MRN: [id number] ?DOB: 1934/03/10 ?Today's Date: 05/21/2021 ?Time: 2575-0518 ?SLP Time Calculation (min) (ACUTE ONLY): 25 min ? ?Assessment / Plan / Recommendation ?Clinical Impression ? Patient seen by SLP for skilled treatment focused on dysphagia goals. Patient was alert and cooperative during session, sitting up in bed. During oral care with toothette sponge, patient did wince a couple times and when asked he said "my mouth hurts". SLP examined patient's oral cavity which appeared clean and moist and with no observed irritations or sores. He denied that his teeth hurt saying "just my mouth". Patient was receptive towards having some water and ice cream. He demonstrated ability to suck through straw and take multiple, successive sips. Mild amount of delayed throat clearing and congested, non-productive cough observed. This same delayed cough and throat clear was observed after ice cream and unable to differentiate cough/throat clearing occuring from PO's or phlegm/secretions. As patient is much more participatory, SLP is recommending repeat MBS to determine safest PO's as he is currently NPO.  ? ?  ?HPI HPI: 87 yo male adm to [facility]lh with recurrent asp from SNF. He is non verbal and has h/o CVA and is nonverbal. Hazy opacities in the bilateral lung bases are favored to reflect atelectasis though infection not excluded. CXR 4/19.  [facility] at [facility] and underwent SLP swallow eval - Recommended diet was dys2/thin - Pt with subtle cough during session on prior evaluation but SLP felt not coorelated to po intake.  Per prior imaging, pt has Old left basal ganglia and periventricular white matter lacunar infarcts. ?  ?   ?SLP Plan ? Continue with current plan of care;MBS ? ?  ?  ?Recommendations for follow up therapy are one component of a multi-disciplinary discharge planning process, led by the attending physician.  Recommendations  may be updated based on patient status, additional functional criteria and insurance authorization. ?  ? ?Recommendations  ?Diet recommendations: NPO ?Medication Administration: Via alternative means  ?   ?    ?   ? ? ? ? Oral Care Recommendations: Oral care QID;Staff/trained caregiver to provide oral care ?Follow Up Recommendations: Other (comment) (TBD pending repeat MBS results) ?Assistance recommended at discharge: Frequent or constant Supervision/Assistance ?SLP Visit Diagnosis: Dysphagia, oropharyngeal phase (R13.12) ?Plan: Continue with current plan of care;MBS ? ? ? ? ?  ?  ? ? ?Angela Nevin, MA, CCC-SLP ?Speech Therapy ? ?

## 2021-05-21 NOTE — Progress Notes (Signed)
Modified Barium Swallow Progress Note ? ?Patient Details  ?Name: Johnathan Mitchell. ?MRN: [id number] ?Date of Birth: Sep 22, 1934 ? ?Today's Date: 05/21/2021 ? ?Modified Barium Swallow completed.  Full report located under Chart Review in the Imaging Section. ? ?Brief recommendations include the following: ? ?Clinical Impression ? Patient presents with a mild-mod oropharyngeal dysphagia as per this repeat MBS. He was cooperative and did not exhibit any difficulty in participation as he had in [facility] on 4/21. During oral phase, patient unable to maintain bolus of puree solids and 1/2 barium tablet and SLP instructed him to spit out tablet. He transited puree solids, nectar thick liquids and thin liquids with mild delay in anterior to posterior transit with suspected weak lingual manipulation. During pharyngeal phase, he exhibited swallow initiation delays to level of pyriform sinus with thin liquids and delay to level of vallecular sinus with puree and nectar thick liquids. He did exhibit penetration above vocal cords (PAS 2 and 3) and although aspiration not visualized, view was obscured by his shoulders. No penetration or aspiration observed with nectar thick liquids. He did not exhbit any PO residuals after initial swallows of any of the tested boluses (nectar thick, thin, puree). SLP did observe backflow of barium into cervical esophagus (which did transit any further) and questionable barium stasis in upper esophagus (difficult to visualize with clarity of image and patient's shoulder obscuring). Esophageal sweep did not reveal any dysmotility or barium stasis in mid or distal portion of esophagus. SLP is recommending Dys 1 (puree) solids and nectar thick liquids at this time. ?  ?Swallow Evaluation Recommendations ? ?   ? ? SLP Diet Recommendations: Dysphagia 1 (Puree) solids;Nectar thick liquid ? ?   ? ? Medication Administration: Crushed with puree ? ? Supervision: Full supervision/cueing for compensatory  strategies;Staff to assist with self feeding;Full assist for feeding ? ? Compensations: Minimize environmental distractions;Slow rate;Small sips/bites ? ? Postural Changes: Seated upright at 90 degrees;Remain semi-upright after after feeds/meals (Comment) (30 minutes) ? ? Oral Care Recommendations: Oral care QID;Staff/trained caregiver to provide oral care;Oral care BID ? ?   ? ? ? ?Sonia Baller, MA, CCC-SLP ?Speech Therapy ? ?

## 2021-05-21 NOTE — Progress Notes (Signed)
Chaplain received referral from Palliative Care to provide emotional support to Johnathan Mitchell and his daughter.  Daughter was not present at time of visit and Johnathan Mitchell was having lunch.  Will attempt follow up tomorrow. ? ?[employer], Bcc ?Pager, [phone] ?

## 2021-05-22 DIAGNOSIS — I10 Essential (primary) hypertension: Secondary | ICD-10-CM | POA: Diagnosis not present

## 2021-05-22 DIAGNOSIS — F03918 Unspecified dementia, unspecified severity, with other behavioral disturbance: Secondary | ICD-10-CM | POA: Diagnosis not present

## 2021-05-22 DIAGNOSIS — Z7189 Other specified counseling: Secondary | ICD-10-CM | POA: Diagnosis not present

## 2021-05-22 LAB — COMPREHENSIVE METABOLIC PANEL
ALT: 11 U/L (ref 0–44)
AST: 12 U/L — ABNORMAL LOW (ref 15–41)
Albumin: 2.1 g/dL — ABNORMAL LOW (ref 3.5–5.0)
Alkaline Phosphatase: 47 U/L (ref 38–126)
Anion gap: 4 — ABNORMAL LOW (ref 5–15)
BUN: 6 mg/dL — ABNORMAL LOW (ref 8–23)
CO2: 26 mmol/L (ref 22–32)
Calcium: 8.2 mg/dL — ABNORMAL LOW (ref 8.9–10.3)
Chloride: 114 mmol/L — ABNORMAL HIGH (ref 98–111)
Creatinine, Ser: 0.56 mg/dL — ABNORMAL LOW (ref 0.61–1.24)
GFR, Estimated: >60 mL/min (ref >60)
Glucose, Bld: 219 mg/dL — ABNORMAL HIGH (ref 70–99)
Potassium: 2.3 mmol/L — CL (ref 3.5–5.1)
Sodium: 144 mmol/L (ref 135–145)
Total Bilirubin: 0.4 mg/dL (ref 0.3–1.2)
Total Protein: 5.1 g/dL — ABNORMAL LOW (ref 6.5–8.1)

## 2021-05-22 LAB — CBC
HCT: 30.9 % — ABNORMAL LOW (ref 39.0–52.0)
Hemoglobin: 9.8 g/dL — ABNORMAL LOW (ref 13.0–17.0)
MCH: 27.5 pg (ref 26.0–34.0)
MCHC: 31.7 g/dL (ref 30.0–36.0)
MCV: 86.6 fL (ref 80.0–100.0)
Platelets: 185 10*3/uL (ref 150–400)
RBC: 3.57 MIL/uL — ABNORMAL LOW (ref 4.22–5.81)
RDW: 17.3 % — ABNORMAL HIGH (ref 11.5–15.5)
WBC: 9.5 10*3/uL (ref 4.0–10.5)
nRBC: 0 % (ref 0.0–0.2)

## 2021-05-22 LAB — MAGNESIUM: Magnesium: 1.6 mg/dL — ABNORMAL LOW (ref 1.7–2.4)

## 2021-05-22 MED ORDER — POTASSIUM CHLORIDE 10 MEQ/100ML IV SOLN
10.0000 meq | INTRAVENOUS | Status: AC
  Administered 2021-05-22 (×2): 10 meq via INTRAVENOUS
  Filled 2021-05-22: qty 100

## 2021-05-22 MED ORDER — POTASSIUM CHLORIDE 10 MEQ/100ML IV SOLN
10.0000 meq | INTRAVENOUS | Status: AC
  Administered 2021-05-22 (×4): 10 meq via INTRAVENOUS
  Filled 2021-05-22 (×2): qty 100

## 2021-05-22 MED ORDER — MAGNESIUM SULFATE 4 GM/100ML IV SOLN
4.0000 g | Freq: Once | INTRAVENOUS | Status: AC
  Administered 2021-05-22: 4 g via INTRAVENOUS
  Filled 2021-05-22: qty 100

## 2021-05-22 NOTE — Patient Care Conference (Signed)
Attempted to call daughter at number listed to give update. No answer ?

## 2021-05-22 NOTE — Progress Notes (Signed)
Modified Barium Swallow Progress Note ?  ?Patient Details  ?Name: Johnathan Mitchell. ?MRN: [id number] ?Date of Birth: 1934-02-06 ?  ?Today's Date: 05/18/2021 ? ? ? 05/18/21 0826  ?Pain Assessment  ?Pain Assessment Faces  ?Breathing 0  ?Negative Vocalization 1  ?Facial Expression 0  ?Body Language 0  ?Consolability 0  ?PAINAD Score 1  ?General Information  ?HPI 87 yo male adm to w[facility]h with recurrent asp from SNF. He is non verbal and has h/o CVA and is nonverbal. Hazy opacities in the bilateral lung bases are favored to reflect atelectasis though infection not excluded. CXR 4/19.  [facility] at [facility] and underwent SLP swallow eval - Recommended diet was dys2/thin - Pt with subtle cough during session on prior evaluation but SLP felt not coorelated to po intake.  Per prior imaging, pt has Old left basal ganglia and periventricular white matter lacunar infarcts.  ?Type of Study MBS-Modified Barium Swallow Study  ?Previous Swallow Assessment prior bses  ?Diet Prior to this Study NPO;IV  ?Temperature Spikes Noted No  ?Respiratory Status Nasal cannula  ?History of Recent Intubation No  ?Behavior/Cognition Alert;Cooperative  ?Oral Cavity Assessment Dry  ?Oral Care Completed by SLP No  ?Oral Cavity - Dentition Missing dentition ?(some dentition missing)  ?Vision Functional for self feeding  ?Self-Feeding Abilities Total assist  ?Patient Positioning Upright in chair  ?Baseline Vocal Quality Wet  ?Volitional Cough Cognitively unable to elicit  ?Volitional Swallow Unable to elicit  ?Pharyngeal Secretions Standing secretions in (comment) ?(pharynx)  ? ? ?  05/18/21 0935  ?Subjective  ?Patient/Family Stated Goal pt did state he wanted ice clinically  ?Prognosis  ?Prognosis for Safe Diet Advancement Guarded  ?Barriers to Reach Goals Severity of deficits;Time post onset;Other (Comment) ?(progressive disease process)  ?Individuals Consulted  ?Consulted and Agree with Results and Recommendations Patient unable/family or caregiver not  available;RN;MD  ?Progression Toward Goals  ?Progression toward goals Progressing toward goals  ?SLP Time Calculation  ?SLP Start Time (ACUTE ONLY) [phone]  ?SLP Stop Time (ACUTE ONLY) 0919  ?SLP Time Calculation (min) (ACUTE ONLY) 26 min  ?  ?   ?  ?  ?  ? ?  ?Modified Barium Swallow completed.  Full report located under Chart Review in the Imaging Section. ?  ?Brief recommendations include the following: ?  ?Clinical Impression ?            Limited MBS conducted due to pt decreased participation. Pt cried out during MBS when oral suctioning was provided - but he also cried out when touched. Drooling noted prior to any barium administration - of which SLP orally suctioned to remove.  Pt only orally transited 2 boluses of approx. 10 offered.  He did not form labial seal on straw nor spoon.   ?  ?Nectar thick and pudding barium only consistencies that he transited into pharynx and they essentially spilled into pharynx.  Delayed swallow initiation due to sensory deficit observed with pt pooling secretions and barium at vallecular region with puree - to pyriform with nectar prior to swallow.  Trace laryngeal penetration of nectar observed from pyriform sinus.  Pharyngeal swallow with puree was strong without retention.   ?  ?Fortunately, MBS provided useful information re: his swallow function regarding pharyngeal function.  SLP questions impact of barium on his participation *despite SLP adding sweetener to barium*.    ?  ?Pt did present with congested cough at end of MBS - concern for barium aspiration that mixed with secretions and SlP  was able to orally suction minimal amount of viscous secretions from posterior oral cavity. Pt reported improved comfort afterward.   ?  ?Will follow up with pt clinically to combine MBS findings with clinical observations re: his dysphagia - but aspiration will not be prevented in this patient- and he is overtly aspirating secretions. Would recommend a palliative consult *if not  initiated yet.   ?  ?Given progressive medical decline, prognosis for swallow function to return to adequate level is guarded. Comfort feeds if family desires may be a reasonable care plan for this most unfortunate pt. ?  ?Swallow Evaluation Recommendations ?  ? Recommended Consults: Other (Comment) ?  ? SLP Diet Recommendations: NPO;NPO except meds;Ice chips PRN after oral care ?Consider comfort feeds if family/md agreeable ?   ?  ? Medication Administration: Via alternative means ?  ?   ?  ? Compensations: Slow rate;Small sips/bites;Other (Comment) (assure pt swallows prior to giving more po due to prominent delay) ?  ?   ?  ? Oral Care Recommendations: Oral care QID ?  ? Other Recommendations: Have oral suction available ?  ?  ?Rolena Infante, MS CCC SLP ?Acute Rehab Services ?Office [phone] ?Pager [phone] ?  ?Chales Abrahams ?05/18/2021,9:37 AM ? ? ? ? ? 05/18/21 0919  ?Oral Preparation/Oral Phase  ?Oral Phase Impaired  ?Oral - Nectar  ?Oral - Nectar Teaspoon Left anterior bolus loss;Right anterior bolus loss  ?Oral - Nectar Cup Left anterior bolus loss;Right anterior bolus loss;Decreased bolus cohesion;Delayed oral transit;Premature spillage;Lingual/palatal residue;Reduced posterior propulsion;Weak lingual manipulation  ?Oral - Nectar Straw Other (Comment)  ?Oral - Thin  ?Oral - Thin Teaspoon Right anterior bolus loss;Left anterior bolus loss;Decreased bolus cohesion  ?Oral - Thin Cup Right anterior bolus loss;Left anterior bolus loss;Decreased bolus cohesion  ?Oral - Thin Straw Other (Comment)  ?Oral - Solids  ?Oral - Puree Delayed oral transit;Reduced posterior propulsion;Left anterior bolus loss;Right anterior bolus loss;Premature spillage;Decreased bolus cohesion  ?Oral Phase - Comment  ?Oral Phase - Comment pt did not perform suction on straw with several attempts  ? ? ?  05/18/21 0922  ?Pharyngeal Phase  ?Pharyngeal Phase Impaired  ?Pharyngeal - Nectar  ?Pharyngeal- Nectar Teaspoon Other (Comment)   ?Pharyngeal- Nectar Cup Delayed swallow initiation-vallecula;Penetration/Aspiration during swallow  ?Pharyngeal Material enters airway, remains ABOVE vocal cords and not ejected out  ?Pharyngeal- Nectar Straw Other (Comment)  ?Pharyngeal - Thin  ?Pharyngeal- Thin Teaspoon Other (Comment)  ?Pharyngeal- Thin Cup Other (Comment)  ?Pharyngeal- Thin Straw Other (Comment)  ?Pharyngeal - Solids  ?Pharyngeal- Puree Delayed swallow initiation-vallecula  ?Pharyngeal Material does not enter airway  ?  ?  ?  05/18/21 0936  ?SLP Evaluations  ?$ SLP Speech Visit 1 Visit  ?SLP Evaluations  ?$MBS Swallow 1 Procedure  ?  ? Rolena Infante, MS CCC SLP ?Acute Rehab Services ?Office [phone] ?Pager [phone] ? ?

## 2021-05-22 NOTE — Assessment & Plan Note (Signed)
-  Mg of 1.6 today ?-Will replace ?

## 2021-05-22 NOTE — Assessment & Plan Note (Signed)
-  Down to 2.3 ?-will replace ?-Repeat bmet in AM ?

## 2021-05-22 NOTE — Progress Notes (Signed)
?  Progress Note ? ? ?Patient: Johnathan Mitchell. [id number] DOB: 08/12/1934 DOA: 05/16/2021     6 ?DOS: the patient was seen and examined on 05/22/2021 ?  ?Brief hospital course: ?87 y.o. male with medical history significant of aspiration PNA, HTN, seizures, asthma, prior CVA who presents from facility with concerns of recurrent aspiration PNA. Pt is currently unable to provide his own history. Per report, pt was recently admitted for PNA and discharged to SNF. Pt noted to be acutely hypoxemic with O2 sats down to 78% on RA, requiring supplemental O2. On presentation to ED, pt had CXR performed which demonstrated hazy opacities in the B lung bases, reviewed. Pt was given dose of rocephin and hospitalist consulted for consideration for admission ? ?Assessment and Plan: ?* Aspiration pneumonia (HCC) ?-CXR reviewed, findings concerning for B infiltrates ?-brownish secretions suctioned out in ED with resultant improvement in O2 requirments ?-Concerns for aspiration. ?-Will cont on azithro plus rocephin ?-Given recurrent aspiration in setting of baseline dementia, have consulted Palliative Care to establish goals of care. ?-Pt has passed swallow eval Jun 17, 2022, now on dysphagia 1 with nectar thick liquids ?-Recommend palliative care support at his facility ? ?Hypomagnesemia ?-Mg of 1.6 today ?-Will replace ? ?Seizure (HCC) ?-No evidence of seizure activity at this time ?-Currently continued on IV keppra for now ? ?Hypokalemia ?-Down to 2.3 ?-will replace ?-Repeat bmet in AM ? ?AKI (acute kidney injury) (HCC) ?-Presenting Cr of 1.58, baseline Cr <1 ?-Cr normalized with IVF ?-recheck bmet in AM ? ?Dementia with behavioral disturbance (HCC) ?-seems more conversant and awake ?-Cont to monitor for now ?-Have consulted PT/OT, recs were noted for SNF ? ?Benign essential HTN ?-BP stable at this time ?-Cont on PRN hydralazine as needed ? ?Cerebral infarction [facility]) ?-Seems stable at this time ? ? ?  ? ?Subjective: No complaints this  AM. Reports tolerating breakfast this AM ? ?Physical Exam: ?Vitals:  ? Jun 16, 2021 1844 06/16/2021 2023 05/22/21 0428 05/22/21 1305  ?BP:  (!) 169/88 (!) 149/83 (!) 158/77  ?Pulse: (!) 106 100 87 83  ?Resp:  18 18 20   ?Temp:  98.7 ?F (37.1 ?C) 98.1 ?F (36.7 ?C) 97.8 ?F (36.6 ?C)  ?TempSrc:  Oral Oral Oral  ?SpO2: 97% 100% 100% 100%  ?Weight:      ?Height:      ? ?General exam: Awake, laying in bed, in nad ?Respiratory system: Normal respiratory effort, no wheezing ?Cardiovascular system: regular rate, s1, s2 ?Gastrointestinal system: Soft, nondistended, positive BS ?Central nervous system: CN2-12 grossly intact, strength intact ?Extremities: Perfused, no clubbing ?Skin: Normal skin turgor, no notable skin lesions seen ?Psychiatry: Mood normal // no visual hallucinations  ? ?Data Reviewed: ? ?Labs reviewed: K 2.3, Cr 0.56 , Mg 1.6 ? ?Family Communication: Pt in room, family not at bedside ? ?Disposition: ?Status is: Inpatient ?Remains inpatient appropriate because: Severity of illness ? Planned Discharge Destination: Skilled nursing facility ? ? ? ? ?Author: ? , MD ?05/22/2021 5:44 PM ? ?For on call review [email or web address].  ?

## 2021-05-22 NOTE — Progress Notes (Signed)
SLP Cancellation Note ? ?Patient Details ?Name: Johnathan Mitchell. ?MRN: [id number] ?DOB: 09/25/1934 ? ? ?Cancelled treatment:       Reason Eval/Treat Not Completed: Other (comment) (RN reports pt having a lot of pain, ate breakfast and lunch today, will continue efforts) ? ?Kathleen Lime, MS CCC SLP ?Acute Rehab Services ?Office [phone] ?Pager [phone] ? ? ?[name] ?05/22/2021, 6:10 PM ?

## 2021-05-22 NOTE — Progress Notes (Signed)
? ?                                                                                                                                                     ?                                                   ?Daily Progress Note  ? ?Patient Name: Johnathan Mitchell.       Date: 05/22/2021 ?DOB: 01-21-1935  Age: 87 y.o. MRN#: [id number] ?Attending Physician: Donne Hazel, MD ?Primary Care Physician: [facility] ?Admit Date: 05/16/2021 ? ?Reason for Consultation/Follow-up: Establishing goals of care ? ?Subjective: ?I saw and examined Johnathan Mitchell this afternoon. Spoke with nursing colleague, he has good PO intake with full assistance.   ?  ? ?Length of Stay: 6 ? ?Current Medications: ?Scheduled Meds:  ?? acetaminophen  650 mg Rectal Once  ?? chlorhexidine  15 mL Mouth Rinse BID  ?? enoxaparin (LOVENOX) injection  40 mg Subcutaneous Q24H  ? ? ?Continuous Infusions: ?? dextrose 5 % and 0.9% NaCl 75 mL/hr at 05/22/21 0300  ?? levETIRAcetam 500 mg (05/22/21 [id number])  ? ? ?PRN Meds: ?acetaminophen **OR** acetaminophen, hydrALAZINE ? ?Physical Exam         ?HEENT: No bruits, no goiter, no JVD ?Heart: Regular rate and rhythm. No murmur appreciated. ?Lungs: Good air movement, clear ?Abdomen: Soft, nontender, nondistended, positive bowel sounds.   ?Ext: No significant edema ?Skin: Warm and dry ?Neuro: Grossly intact, nonfocal. ? ?Vital Signs: BP (!) 158/77 (BP Location: Left Leg)   Pulse 83   Temp 97.8 ?F (36.6 ?C) (Oral)   Resp 20   Ht 5\' 6"  (1.676 m)   Wt 55.2 kg   SpO2 100%   BMI 19.64 kg/m?  ?SpO2: SpO2: 100 % ?O2 Device: O2 Device: Room Air ?O2 Flow Rate: O2 Flow Rate (L/min): 2 L/min ? ?Intake/output summary:  ?Intake/Output Summary (Last 24 hours) at 05/22/2021 1433 ?Last data filed at 05/22/2021 1306 ?Gross per 24 hour  ?Intake 2633.63 ml  ?Output 450 ml  ?Net 2183.63 ml  ? ? ?LBM: Last BM Date : 05/17/21 ?Baseline Weight: Weight: 56 kg ?Most recent weight: Weight: 55.2 kg ? ?     ?Palliative  Assessment/Data: ? ? ? ?Flowsheet Rows   ? ?Flowsheet Row Most Recent Value  ?Intake Tab   ?Referral Department Hospitalist  ?Unit at Time of Referral Med/Surg Unit  ?Palliative Care Primary Diagnosis Neurology  ?Date Notified 05/17/21  ?Palliative Care Type New Palliative care  ?Reason for referral Clarify Goals of Care  ?Date of Admission 05/16/21  ?Date first seen by Palliative Care 05/19/21  ?# of days Palliative  referral response time 2 Day(s)  ?# of days IP prior to Palliative referral 1  ?Clinical Assessment   ?Palliative Performance Scale Score 50%  ?Psychosocial & Spiritual Assessment   ?Palliative Care Outcomes   ?Patient/Family meeting held? Yes  ?Who was at the meeting? Daughter via phone  ?Palliative Care Outcomes Clarified goals of care  ? ?  ? ? ?Patient Active Problem List  ? Diagnosis Date Noted  ?? Goals of care, counseling/discussion   ?? Palliative care by specialist   ?? Aspiration pneumonia ([place]) 05/16/2021  ?? Pressure injury of skin 03/13/2021  ?? Hypophosphatemia 03/13/2021  ?? HCAP (healthcare-associated pneumonia) 03/13/2021  ?? Hypokalemia 03/12/2021  ?? Dehydration 03/12/2021  ?? Prolonged QT interval 03/12/2021  ?? Generalized weakness 03/12/2021  ?? Seizure ([place]) 03/12/2021  ?? Cellulitis of left hand 12/15/2019  ?? Wrist pain 12/15/2019  ?? Closed left acetabular fracture ([place]) 12/14/2019  ?? SIRS (systemic inflammatory response syndrome) ([place]) 12/14/2019  ?? Protein-calorie malnutrition ([place]) 05/20/2017  ?? TIA (transient ischemic attack) 05/20/2017  ?? 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 AKI (acute kidney injury) ([place]) 05/12/2017  ?? Stroke ([place]) 05/11/2017  ?? Femur fracture ([place]) 05/11/2017  ?? Chest pain 02/09/2016  ?? HTN (hypertension) 02/09/2016  ?? Dementia with behavioral disturbance ([place]) 02/09/2016  ?? Chest pain at rest 02/09/2016  ?? Cerebral infarction ([place]) 02/01/2014  ?? Benign essential HTN 02/01/2014  ? ? ?Palliative Care Assessment & Plan  ? ?Patient  Profile: ?87 y.o. male  with past medical history of aspiration pneumonia, hypertension, seizures, asthma, prior CVA admitted on 05/16/2021 with aspiration pneumonia.  Palliative consult for goals of care. ? ?Recommendations/Plan: ?Full code/full scope ?Continue current PO intake and monitor ?Recommend palliative support at his facility.  ?  ? ?Goals of Care and Additional Recommendations: ?Limitations on Scope of Treatment: Full Scope Treatment ? ?Code Status: ? ?  ?Code Status Orders  ?(From admission, onward)  ?  ? ? ?  ? ?  Start     Ordered  ? 05/16/21 1721  Full code  Continuous       ? 05/16/21 1727  ? ?  ?  ? ?  ? ?Code Status History   ? ? Date Active Date Inactive Code Status Order ID Comments User Context  ? 03/12/2021 0409 03/15/2021 1924 Full Code [id number]  Rhetta Mura, DO ED  ? 12/14/2019 1842 12/20/2019 1816 Full Code [id number]  Reubin Milan, MD ED  ? 12/14/2019 1842 12/14/2019 1842 Full Code [id number]  Reubin Milan, MD ED  ? 05/11/2017 2146 05/16/2017 2308 Full Code [id number]  Jani Gravel, MD Inpatient  ? 02/01/2014 1726 02/02/2014 2133 Full Code [id number]  Jonetta Osgood, MD ED  ? ?  ? ? ?Prognosis: ? Unable to determine ? ?Discharge Planning: ?To Be Determined ? ?Care plan was discussed with IDT ? ?Thank you for allowing the Palliative Medicine Team to assist in the care of this patient. ? ? ?Total time: 25 minutes ?Loistine Chance, MD ? ?Please contact Palliative Medicine Team phone at [phone] for questions and concerns.  ? ? ? ? ? ?

## 2021-05-23 DIAGNOSIS — J690 Pneumonitis due to inhalation of food and vomit: Secondary | ICD-10-CM | POA: Diagnosis not present

## 2021-05-23 LAB — MAGNESIUM: Magnesium: 2.1 mg/dL (ref 1.7–2.4)

## 2021-05-23 LAB — CBC
HCT: 31.7 % — ABNORMAL LOW (ref 39.0–52.0)
Hemoglobin: 10 g/dL — ABNORMAL LOW (ref 13.0–17.0)
MCH: 27.2 pg (ref 26.0–34.0)
MCHC: 31.5 g/dL (ref 30.0–36.0)
MCV: 86.4 fL (ref 80.0–100.0)
Platelets: 212 10*3/uL (ref 150–400)
RBC: 3.67 MIL/uL — ABNORMAL LOW (ref 4.22–5.81)
RDW: 17.5 % — ABNORMAL HIGH (ref 11.5–15.5)
WBC: 11.8 10*3/uL — ABNORMAL HIGH (ref 4.0–10.5)
nRBC: 0 % (ref 0.0–0.2)

## 2021-05-23 LAB — COMPREHENSIVE METABOLIC PANEL
ALT: 11 U/L (ref 0–44)
AST: 13 U/L — ABNORMAL LOW (ref 15–41)
Albumin: 2.2 g/dL — ABNORMAL LOW (ref 3.5–5.0)
Alkaline Phosphatase: 54 U/L (ref 38–126)
Anion gap: 5 (ref 5–15)
BUN: 5 mg/dL — ABNORMAL LOW (ref 8–23)
CO2: 29 mmol/L (ref 22–32)
Calcium: 8.2 mg/dL — ABNORMAL LOW (ref 8.9–10.3)
Chloride: 112 mmol/L — ABNORMAL HIGH (ref 98–111)
Creatinine, Ser: 0.45 mg/dL — ABNORMAL LOW (ref 0.61–1.24)
GFR, Estimated: >60 mL/min (ref >60)
Glucose, Bld: 158 mg/dL — ABNORMAL HIGH (ref 70–99)
Potassium: 2.6 mmol/L — CL (ref 3.5–5.1)
Sodium: 146 mmol/L — ABNORMAL HIGH (ref 135–145)
Total Bilirubin: 0.6 mg/dL (ref 0.3–1.2)
Total Protein: 5.5 g/dL — ABNORMAL LOW (ref 6.5–8.1)

## 2021-05-23 MED ORDER — POTASSIUM CHLORIDE 10 MEQ/100ML IV SOLN
10.0000 meq | INTRAVENOUS | Status: AC
  Administered 2021-05-23 – 2021-05-24 (×4): 10 meq via INTRAVENOUS
  Filled 2021-05-23 (×3): qty 100

## 2021-05-23 MED ORDER — POTASSIUM CHLORIDE 10 MEQ/100ML IV SOLN
10.0000 meq | INTRAVENOUS | Status: AC
  Administered 2021-05-23 (×4): 10 meq via INTRAVENOUS
  Filled 2021-05-23 (×3): qty 100

## 2021-05-23 MED ORDER — POTASSIUM CHLORIDE 20 MEQ PO PACK
40.0000 meq | PACK | ORAL | Status: AC
  Filled 2021-05-23: qty 2

## 2021-05-23 MED ORDER — POTASSIUM CHLORIDE 20 MEQ PO PACK: 40.0000 meq | PACK | ORAL | Status: DC

## 2021-05-23 MED ORDER — POTASSIUM CHLORIDE CRYS ER 20 MEQ PO TBCR: 40.0000 meq | EXTENDED_RELEASE_TABLET | ORAL | Status: AC

## 2021-05-23 MED ORDER — BACLOFEN 5 MG PO TABS: 7.5000 mg | ORAL_TABLET | Freq: Three times a day (TID) | ORAL | 0 refills | Status: AC

## 2021-05-23 NOTE — Progress Notes (Signed)
Date and time results received: 05/23/21 1212 ? ?Test: potassium ? ?Critical Value: 2.6 ? ?Name of Provider Notified: Pahwani  ? ?Orders Received? Or Actions Taken?:  ?

## 2021-05-23 NOTE — Progress Notes (Signed)
[facility] ([facility]) Hospital Liaison Note ° °Notified by TOC manager of patient/family request for [facility] palliative services at [facility] after discharge.  ° °[facility] hospital liaison will follow patient for discharge disposition.  ° °Please call with any hospice or outpatient palliative care related questions.  ° °Thank you for the opportunity to participate in this patient's care.  ° °Shanita Wicker, LCSW °[facility] Hospital Liaison °[phone] ° °

## 2021-05-23 NOTE — Progress Notes (Signed)
Speech Language Pathology Treatment: Dysphagia  ?Patient Details ?Name: Johnathan Mitchell. ?MRN: [id number] ?DOB: 11-02-1934 ?Today's Date: 05/23/2021 ?Time: 0912-0950 ?SLP Time Calculation (min) (ACUTE ONLY): 38 min ? ?Assessment / Plan / Recommendation ?Clinical Impression ? Pt seen to address dysphagia goals including assessing for po tolerance, effective compensation strategies.  NT reports pt coughing with nectar liquid and cream of wheat today.  He wincing in pain when he is touched per NT - which was observed during prior evaluation by this SLP April 21st.  Note palliative meeting documentation and pt remains full code.  SLP set up pt for po using reverse trendelenberg and pillow behind his head for optimal safety.  Pt attempted to help hand over hand but is weak - and is cueing when ready for more by opening mouth - thus SLP fed him.  In total, pt swallows 20 boluses over approximately 30 minutes.  Swallow continues to be significantly delayed - up to 19 seconds - at times requiring maximal tactile cue - spoon pressure to tongue- to trigger a swallow.  In large part, swallow with liquids was steadily delayed 7-12 seconds.  Reflexive congested cough x2 noted - and subtle throat clear x1 of 20 boluses.  Pt appears to attempt to follow directions but does not despite total cues.  Voice today is clear - thus he is not aspirating secretions at this time - following po intake - SLP did provide oral suction due to standing secretions in oral cavity.  Johnathan Mitchell however is a laborious feeder due to his delay in swallow and thus will remain very high aspiration/malnutrition risk.  SLP to follow up in hopes that family is present to educate them to findings/recommendations and concerns.  No family present at this time, nor has anyone been present during this SLP's sessions.  Pt is on a diet with mitigation strategies to maximize airway protection. ?  ?HPI HPI: 87 yo male [facility] with recurrent asp from SNF. He is non  verbal and has h/o CVA and is nonverbal. Hazy opacities in the bilateral lung bases are favored to reflect atelectasis though infection not excluded. CXR 4/19.  [facility] recently at [facility] and underwent SLP swallow eval - Recommended diet was dys2/thin - Pt with subtle cough during session on prior evaluation but SLP felt not coorelated to po intake.  Per prior imaging, pt has Old left basal ganglia and periventricular white matter lacunar infarcts. ?  ?   ?SLP Plan ? Continue with current plan of care ? ?  ?  ?Recommendations for follow up therapy are one component of a multi-disciplinary discharge planning process, led by the attending physician.  Recommendations may be updated based on patient status, additional functional criteria and insurance authorization. ?  ? ?Recommendations  ?Diet recommendations: Dysphagia 1 (puree);Nectar-thick liquid (Italian ice ok) ?Liquids provided via: No straw;Cup;Teaspoon ?Medication Administration: Crushed with puree ?Supervision: Full supervision/cueing for compensatory strategies;Trained caregiver to feed patient (informed NT to precautions) ?Compensations: Minimize environmental distractions;Slow rate;Small sips/bites ?Postural Changes and/or Swallow Maneuvers: Seated upright 90 degrees;Upright 30-60 min after meal  ?   ?    ?   ? ? ? ? Oral Care Recommendations: Oral care QID;Staff/trained caregiver to provide oral care ?Follow Up Recommendations: Skilled nursing-short term rehab (<3 hours/day) ?Assistance recommended at discharge: Frequent or constant Supervision/Assistance ?SLP Visit Diagnosis: Dysphagia, oropharyngeal phase (R13.12) ?Plan: Continue with current plan of care ? ? ? ? ?  ?  ? ?Johnathan Infante, MS CCC SLP ?  Acute Rehab Services ?Office [phone] ?Pager [phone] ? ?Johnathan Mitchell ? ?05/23/2021, 10:12 AM ?

## 2021-05-23 NOTE — Progress Notes (Signed)
?PROGRESS NOTE ? ? ? ?Johnathan Mitchell.  JME:[id number] DOB: [birth year] DOA: 05/16/2021 ?PCP: [facility] ? ? ?Brief Narrative:  ?87 y.o. male with medical history significant of aspiration PNA, HTN, seizures, asthma, prior CVA who presents from facility with concerns of recurrent aspiration PNA. Pt is currently unable to provide his own history. Per report, pt was recently admitted for PNA and discharged to SNF. Pt noted to be acutely hypoxemic with O2 sats down to 78% on RA, requiring supplemental O2. On presentation to ED, pt had CXR performed which demonstrated hazy opacities in the B lung bases, reviewed. Pt was given dose of rocephin and hospitalist consulted for consideration for admission ? ?Assessment & Plan: ?  ?Principal Problem: ?  Aspiration pneumonia (HCC) ?Active Problems: ?  HCAP (healthcare-associated pneumonia) ?  Cerebral infarction [facility]) ?  Benign essential HTN ?  Dementia with behavioral disturbance (HCC) ?  AKI (acute kidney injury) (HCC) ?  Hypokalemia ?  Seizure (HCC) ?  Goals of care, counseling/discussion ?  Palliative care by specialist ?  Hypomagnesemia ? ?Aspiration pneumonia (HCC) ?-CXR reviewed, findings concerning for B infiltrates ?-brownish secretions suctioned out in ED with resultant improvement in O2 requirments.  Patient completed antibiotic course with Rocephin and Zithromax. ?  ?Hypomagnesemia: Resolved. ?  ?Seizure (HCC) ?-No evidence of seizure activity at this time ?-Currently continued on IV keppra for now ?  ?Hypokalemia: Was low yesterday.  Unsure how much she was replaced.  He is significantly low again today at 2.6.  Will need aggressive replacement.  Orders placed. ?  ?AKI (acute kidney injury) (HCC) ?-Presenting Cr of 1.58, baseline Cr <1 ?-Cr normalized with IVF ?  ?Dementia with behavioral disturbance [facility]): Patient did not talk to me at all.  He was calm though. ?  ?Benign essential HTN ?-BP stable at this time ?-Cont on PRN hydralazine as needed ?   ?Cerebral infarction [facility]) ?-Seems stable at this time ? ?DVT prophylaxis: enoxaparin (LOVENOX) injection 40 mg Start: 05/17/21 2000 ?  Code Status: Full Code  ?Family Communication: None present at bedside.  Plan of care discussed with patient's daughter earlier and we discussed about discharge plan back to the facility and she agreed with that however we received his potassium results later on and since he is requiring aggressive replacement, we have decided to hold his discharge.,  Replace electrolytes and recheck in the morning and potentially discharge tomorrow if all electrolytes are within normal range. ? ?Status is: Inpatient ? ? ?Estimated body mass index is 19.64 kg/m? as calculated from the following: ?  Height as of this encounter: 5\' 6"  (1.676 m). ?  Weight as of this encounter: 55.2 kg. ? ?Pressure Injury 05/16/21 Buttocks Right Unstageable - Full thickness tissue loss in which the base of the injury is covered by slough (yellow, tan, gray, green or brown) and/or eschar (tan, brown or black) in the wound bed. (Active)  ?05/16/21 2100  ?Location: Buttocks  ?Location Orientation: Right  ?Staging: Unstageable - Full thickness tissue loss in which the base of the injury is covered by slough (yellow, tan, gray, green or brown) and/or eschar (tan, brown or black) in the wound bed.  ?Wound Description (Comments):   ?Present on Admission: Yes  ?Dressing Type Foam - Lift dressing to assess site every shift 05/23/21 0724  ? ?Nutritional Assessment: ?Body mass index is 19.64 kg/m?04/28/23Marland Kitchen ?Seen by dietician.  I agree with the assessment and plan as outlined below: ?Nutrition Status: ?  ?  ?  ? ?. ?  Skin Assessment: ?I have examined the patient's skin and I agree with the wound assessment as performed by the wound care RN as outlined below: ?Pressure Injury 05/16/21 Buttocks Right Unstageable - Full thickness tissue loss in which the base of the injury is covered by slough (yellow, tan, gray, green or brown) and/or  eschar (tan, brown or black) in the wound bed. (Active)  ?05/16/21 2100  ?Location: Buttocks  ?Location Orientation: Right  ?Staging: Unstageable - Full thickness tissue loss in which the base of the injury is covered by slough (yellow, tan, gray, green or brown) and/or eschar (tan, brown or black) in the wound bed.  ?Wound Description (Comments):   ?Present on Admission: Yes  ?Dressing Type Foam - Lift dressing to assess site every shift 05/23/21 0724  ? ? ?Consultants:  ?None ? ?Procedures:  ?Plan ? ?Antimicrobials:  ?Anti-infectives (From admission, onward)  ? ? Start     Dose/Rate Route Frequency Ordered Stop  ? 05/16/21 2200  cefTRIAXone (ROCEPHIN) 2 g in sodium chloride 0.9 % 100 mL IVPB       ? 2 g ?200 mL/hr over 30 Minutes Intravenous Every 24 hours 05/16/21 1727 05/20/21 2311  ? 05/16/21 1800  azithromycin (ZITHROMAX) 500 mg in sodium chloride 0.9 % 250 mL IVPB       ? 500 mg ?250 mL/hr over 60 Minutes Intravenous Every 24 hours 05/16/21 1727 05/20/21 1804  ? 05/16/21 1515  vancomycin (VANCOCIN) IVPB 1000 mg/200 mL premix       ? 1,000 mg ?200 mL/hr over 60 Minutes Intravenous  Once 05/16/21 1514 05/16/21 1654  ? 05/16/21 1515  ceFEPIme (MAXIPIME) 2 g in sodium chloride 0.9 % 100 mL IVPB       ? 2 g ?200 mL/hr over 30 Minutes Intravenous  Once 05/16/21 1514 05/16/21 1626  ? ?  ?  ? ? ?Subjective: ?Patient seen and examined.  He was alert but did not talk to me at all.  He was calm. ? ?Objective: ?Vitals:  ? 05/22/21 0428 05/22/21 1305 05/22/21 2100 05/23/21 0537  ?BP: (!) 149/83 (!) 158/77 (!) 153/80 (!) 143/87  ?Pulse: 87 83 72 68  ?Resp: 18 20 18 18   ?Temp: 98.1 ?F (36.7 ?C) 97.8 ?F (36.6 ?C) 99 ?F (37.2 ?C) 98.4 ?F (36.9 ?C)  ?TempSrc: Oral Oral Oral Oral  ?SpO2: 100% 100% 100% 98%  ?Weight:      ?Height:      ? ? ?Intake/Output Summary (Last 24 hours) at 05/23/2021 1228 ?Last data filed at 05/23/2021 0900 ?Gross per 24 hour  ?Intake 3230 ml  ?Output 450 ml  ?Net 2780 ml  ? ?Filed Weights  ? 05/16/21 1450  05/17/21 0105  ?Weight: 56 kg 55.2 kg  ? ? ?Examination: ? ?General exam: Appears calm and comfortable  ?Respiratory system: Clear to auscultation. Respiratory effort normal. ?Cardiovascular system: S1 & S2 heard, RRR. No JVD, murmurs, rubs, gallops or clicks. No pedal edema. ?Gastrointestinal system: Abdomen is nondistended, soft and nontender. No organomegaly or masses felt. Normal bowel sounds heard. ?Central nervous system: Alert but perhaps not oriented. ? ?Data Reviewed: I have personally reviewed following labs and imaging studies ? ?CBC: ?Recent Labs  ?Lab 05/16/21 ?1538 05/16/21 ?1720 05/19/21 ?0532 05/20/21 ?[id number] 05/21/21 ?[id number] 05/22/21 ?[id number] 05/23/21 ?1055  ?WBC 5.1   < > 11.2* 11.0* 8.9 9.5 11.8*  ?NEUTROABS 4.0  --   --   --   --   --   --   ?HGB 13.6   < >  9.4* 9.6* 9.6* 9.8* 10.0*  ?HCT 42.7   < > 28.4* 30.3* 30.0* 30.9* 31.7*  ?MCV 85.9   < > 86.1 88.1 88.2 86.6 86.4  ?PLT 185   < > 167 161 167 185 212  ? < > = values in this interval not displayed.  ? ?Basic Metabolic Panel: ?Recent Labs  ?Lab 05/16/21 ?1538 05/16/21 ?1720 05/19/21 ?7673 05/19/21 ?4193 05/20/21 ?7902 05/21/21 ?4097 05/22/21 ?3532 05/23/21 ?1055  ?NA 137   < > 142  --  145 147* 144 146*  ?K 4.0   < > 3.1*  --  3.3* 3.0* 2.3* 2.6*  ?CL 101   < > 112*  --  115* 115* 114* 112*  ?CO2 26   < > 21*  --  20* 22 26 29   ?GLUCOSE 122*   < > 55*  --  58* 117* 219* 158*  ?BUN 25*   < > 16  --  11 8 6* 5*  ?CREATININE 1.58*   < > 0.67  --  0.57* 0.48* 0.56* 0.45*  ?CALCIUM 9.2   < > 8.3*  --  8.2* 8.1* 8.2* 8.2*  ?MG 1.8  --   --  1.9  --   --  1.6* 2.1  ? < > = values in this interval not displayed.  ? ?GFR: ?Estimated Creatinine Clearance: 53.7 mL/min (A) (by C-G formula based on SCr of 0.45 mg/dL (L)). ?Liver Function Tests: ?Recent Labs  ?Lab 05/19/21 ?0532 05/20/21 ?05/22/21 05/21/21 ?05/23/21 05/22/21 ?05/24/21 05/23/21 ?1055  ?AST 18 16 13* 12* 13*  ?ALT 16 15 13 11 11   ?ALKPHOS 50 53 48 47 54  ?BILITOT 1.3* 1.4* 1.1 0.4 0.6  ?PROT 5.5* 5.5* 5.3* 5.1*  5.5*  ?ALBUMIN 2.4* 2.4* 2.2* 2.1* 2.2*  ? ?No results for input(s): LIPASE, AMYLASE in the last 168 hours. ?No results for input(s): AMMONIA in the last 168 hours. ?Coagulation Profile: ?Recent Labs  ?Lab

## 2021-05-23 NOTE — Plan of Care (Signed)
  Problem: Coping: Goal: Level of anxiety will decrease Outcome: Not Progressing   

## 2021-05-24 DIAGNOSIS — J690 Pneumonitis due to inhalation of food and vomit: Secondary | ICD-10-CM | POA: Diagnosis not present

## 2021-05-24 LAB — BASIC METABOLIC PANEL
Anion gap: 1 — ABNORMAL LOW (ref 5–15)
BUN: <5 mg/dL — ABNORMAL LOW (ref 8–23)
CO2: 30 mmol/L (ref 22–32)
Calcium: 7.6 mg/dL — ABNORMAL LOW (ref 8.9–10.3)
Chloride: 113 mmol/L — ABNORMAL HIGH (ref 98–111)
Creatinine, Ser: 0.4 mg/dL — ABNORMAL LOW (ref 0.61–1.24)
GFR, Estimated: >60 mL/min (ref >60)
Glucose, Bld: 100 mg/dL — ABNORMAL HIGH (ref 70–99)
Potassium: 2.8 mmol/L — ABNORMAL LOW (ref 3.5–5.1)
Sodium: 144 mmol/L (ref 135–145)

## 2021-05-24 LAB — MAGNESIUM: Magnesium: 1.8 mg/dL (ref 1.7–2.4)

## 2021-05-24 MED ORDER — POTASSIUM CHLORIDE 10 MEQ/100ML IV SOLN
10.0000 meq | INTRAVENOUS | Status: AC
  Administered 2021-05-24 (×6): 10 meq via INTRAVENOUS
  Filled 2021-05-24 (×3): qty 100

## 2021-05-24 MED ORDER — POTASSIUM CHLORIDE 20 MEQ PO PACK
40.0000 meq | PACK | ORAL | Status: AC
  Administered 2021-05-24 (×2): 40 meq via ORAL
  Filled 2021-05-24 (×2): qty 2

## 2021-05-24 NOTE — TOC Progression Note (Signed)
Transition of Care (TOC) - Progression Note  ? ? ?Patient Details  ?Name: [name]. ?MRN: [id number] ?Date of Birth: Dec 08, 1934 ? ?Transition of Care (TOC) CM/SW Contact  ?Johnathan Mitchell, Meriam Sprague, RN ?Phone Number: ?05/24/2021, 9:52 AM ? ?Clinical Narrative:    ? ?TOC alerted that daughter Johnathan Mitchell wanted information on Hospice/Palliative services at [facility]. Spoke with Johnathan Mitchell via phone to explain the difference in Hospice and Palliative services. Johnathan Mitchell states she would like pt to have Palliative services at [facility] at [employer]. Choice offered and [facility] chosen. [facility] liaison contacted for referrral. ? ?Expected Discharge Plan: Skilled Nursing Facility ?Barriers to Discharge: Continued Medical Work up ? ?Expected Discharge Plan and Services ?Expected Discharge Plan: Skilled Nursing Facility ?  ?Discharge Planning Services: CM Consult ?  ?Living arrangements for the past 2 months: Skilled Nursing Facility ?Expected Discharge Date: 05/23/21               ?  ?  ?Readmission Risk Interventions ? ?  05/17/2021  ?  2:11 PM  ?Readmission Risk Prevention Plan  ?Transportation Screening Complete  ?PCP or Specialist Appt within 3-5 Days Complete  ?HRI or Home Care Consult Complete  ?Social Work Consult for Recovery Care Planning/Counseling Complete  ?Palliative Care Screening Not Applicable  ?Medication Review Ocean[facility]) Complete  ? ? ?

## 2021-05-24 NOTE — Progress Notes (Signed)
24 hour chart audit completed 

## 2021-05-24 NOTE — Progress Notes (Signed)
Pt has been increasingly agitated this evening, crying out whenever anyone touches patient and attempting to hit when this nurse attempted to replace telemetry leads.  Pt did not sleep and this nurse is concerned patient may be developing hospital-acquired delirium.  This nurse will pass along to day shift and continue to monitor patient. Mittens have been introduced to keep patient from pulling off telemetry.  ?

## 2021-05-24 NOTE — Progress Notes (Signed)
?PROGRESS NOTE ? ? ? ?Johnathan InchesJames C Regala Jr.  ZOX:[id number] DOB: Jun 10, 1934 DOA: 05/16/2021 ?[facility] ? ? ?Brief Narrative:  ?87 y.o. male with medical history significant of aspiration PNA, HTN, seizures, asthma, prior CVA who presents from facility with concerns of recurrent aspiration PNA. Pt is currently unable to provide his own history. Per report, pt was recently admitted for PNA and discharged to SNF. Pt noted to be acutely hypoxemic with O2 sats down to 78% on RA, requiring supplemental O2. On presentation to ED, pt had CXR performed which demonstrated hazy opacities in the B lung bases, reviewed. Pt was given dose of rocephin and hospitalist consulted for consideration for admission ? ?Assessment & Plan: ?  ?Principal Problem: ?  Aspiration pneumonia (HCC) ?Active Problems: ?  HCAP (healthcare-associated pneumonia) ?  Cerebral infarction [facility](HCC) ?  Benign essential HTN ?  Dementia with behavioral disturbance (HCC) ?  AKI (acute kidney injury) (HCC) ?  Hypokalemia ?  Seizure (HCC) ?  Goals of care, counseling/discussion ?  Palliative care by specialist ?  Hypomagnesemia ? ?Aspiration pneumonia (HCC) ?-CXR reviewed, findings concerning for B infiltrates ?-brownish secretions suctioned out in ED with resultant improvement in O2 requirments.  Patient completed antibiotic course with Rocephin and Zithromax. ?  ?Hypomagnesemia: Resolved. ?  ?Seizure (HCC) ?-No evidence of seizure activity at this time ?-Currently continued on IV keppra for now ?  ?Hypokalemia: Was 2.6 yesterday, per pharmacy, he received total of 70 mEq yesterday although I had ordered more than 120 mEq.  Potassium again at 2.8.  Will aggressively replace again today.  Keep overnight.  Reassess tomorrow. ?  ?AKI (acute kidney injury) (HCC) ?-Presenting Cr of 1.58, baseline Cr <1 ?-Cr normalized with IVF ?  ?Dementia with behavioral disturbance [facility](HCC): Patient did talk to me today.  Speech was partially comprehensible but that is his  baseline.  He appeared calm. ?  ?Benign essential HTN ?-BP stable at this time ?-Cont on PRN hydralazine as needed ?  ?Cerebral infarction [facility](HCC) ?-Seems stable at this time ? ?DVT prophylaxis: enoxaparin (LOVENOX) injection 40 mg Start: 05/17/21 2000 ?  Code Status: Full Code  ?Family Communication: None present at bedside.  Plan of care discussed with patient's daughter yesterday.  Plan is to discharge him back to his facility once his electrolytes are corrected.  Hopefully tomorrow. ? ?Status is: Inpatient ? ? ?Estimated body mass index is 19.64 kg/m? as calculated from the following: ?  Height as of this encounter: 5\' 6"  (1.676 m). ?  Weight as of this encounter: 55.2 kg. ? ?Pressure Injury 05/16/21 Buttocks Right Unstageable - Full thickness tissue loss in which the base of the injury is covered by slough (yellow, tan, gray, green or brown) and/or eschar (tan, brown or black) in the wound bed. (Active)  ?05/16/21 2100  ?Location: Buttocks  ?Location Orientation: Right  ?Staging: Unstageable - Full thickness tissue loss in which the base of the injury is covered by slough (yellow, tan, gray, green or brown) and/or eschar (tan, brown or black) in the wound bed.  ?Wound Description (Comments):   ?Present on Admission: Yes  ?Dressing Type Foam - Lift dressing to assess site every shift 05/23/21 1700  ? ?Nutritional Assessment: ?Body mass index is 19.64 kg/m?Marland Kitchen.Marland Kitchen. ?Seen by dietician.  I agree with the assessment and plan as outlined below: ?Nutrition Status: ?  ?  ?  ? ?. ?Skin Assessment: ?I have examined the patient's skin and I agree with the wound assessment as performed by  the wound care RN as outlined below: ?Pressure Injury 05/16/21 Buttocks Right Unstageable - Full thickness tissue loss in which the base of the injury is covered by slough (yellow, tan, gray, green or brown) and/or eschar (tan, brown or black) in the wound bed. (Active)  ?05/16/21 2100  ?Location: Buttocks  ?Location Orientation: Right  ?Staging:  Unstageable - Full thickness tissue loss in which the base of the injury is covered by slough (yellow, tan, gray, green or brown) and/or eschar (tan, brown or black) in the wound bed.  ?Wound Description (Comments):   ?Present on Admission: Yes  ?Dressing Type Foam - Lift dressing to assess site every shift 05/23/21 1700  ? ? ?Consultants:  ?None ? ?Procedures:  ?none ? ?Antimicrobials:  ?Anti-infectives (From admission, onward)  ? ? Start     Dose/Rate Route Frequency Ordered Stop  ? 05/16/21 2200  cefTRIAXone (ROCEPHIN) 2 g in sodium chloride 0.9 % 100 mL IVPB       ? 2 g ?200 mL/hr over 30 Minutes Intravenous Every 24 hours 05/16/21 1727 05/20/21 2311  ? 05/16/21 1800  azithromycin (ZITHROMAX) 500 mg in sodium chloride 0.9 % 250 mL IVPB       ? 500 mg ?250 mL/hr over 60 Minutes Intravenous Every 24 hours 05/16/21 1727 05/20/21 1804  ? 05/16/21 1515  vancomycin (VANCOCIN) IVPB 1000 mg/200 mL premix       ? 1,000 mg ?200 mL/hr over 60 Minutes Intravenous  Once 05/16/21 1514 05/16/21 1654  ? 05/16/21 1515  ceFEPIme (MAXIPIME) 2 g in sodium chloride 0.9 % 100 mL IVPB       ? 2 g ?200 mL/hr over 30 Minutes Intravenous  Once 05/16/21 1514 05/16/21 1626  ? ?  ?  ? ? ?Subjective: ? ?Patient seen and examined.  No complaints. ? ?Objective: ?Vitals:  ? 05/23/21 1928 05/23/21 2000 05/23/21 2349 05/24/21 0406  ?BP: (!) 156/81   137/72  ?Pulse: 71   74  ?Resp: 16   16  ?Temp: 98.4 ?F (36.9 ?C)   98.1 ?F (36.7 ?C)  ?TempSrc: Oral   Oral  ?SpO2:  100% 93% 92%  ?Weight:      ?Height:      ? ? ?Intake/Output Summary (Last 24 hours) at 05/24/2021 0921 ?Last data filed at 05/24/2021 [phone] ?Gross per 24 hour  ?Intake 1838.71 ml  ?Output 800 ml  ?Net 1038.71 ml  ? ? ?Filed Weights  ? 05/16/21 1450 05/17/21 0105  ?Weight: 56 kg 55.2 kg  ? ? ?Examination: ? ?General exam: Appears calm and comfortable  ?Respiratory system: Clear to auscultation. Respiratory effort normal. ?Cardiovascular system: S1 & S2 heard, RRR. No JVD, murmurs, rubs,  gallops or clicks. No pedal edema. ?Gastrointestinal system: Abdomen is nondistended, soft and nontender. No organomegaly or masses felt. Normal bowel sounds heard. ?Central nervous system: Alert but not oriented, no focal neurological deficits. ?Extremities: Symmetric 5 x 5 power. ? ?Data Reviewed: I have personally reviewed following labs and imaging studies ? ?CBC: ?Recent Labs  ?Lab 05/19/21 ?0532 05/20/21 ?3903 05/21/21 ?0092 05/22/21 ?3300 05/23/21 ?1055  ?WBC 11.2* 11.0* 8.9 9.5 11.8*  ?HGB 9.4* 9.6* 9.6* 9.8* 10.0*  ?HCT 28.4* 30.3* 30.0* 30.9* 31.7*  ?MCV 86.1 88.1 88.2 86.6 86.4  ?PLT 167 161 167 185 212  ? ? ?Basic Metabolic Panel: ?Recent Labs  ?Lab 05/19/21 ?0621 05/20/21 ?7622 05/21/21 ?0453 05/22/21 ?6333 05/23/21 ?1055 05/24/21 ?0704  ?NA  --  145 147* 144 146* 144  ?K  --  3.3* 3.0* 2.3* 2.6* 2.8*  ?CL  --  115* 115* 114* 112* 113*  ?CO2  --  20* 22 26 29 30   ?GLUCOSE  --  58* 117* 219* 158* 100*  ?BUN  --  11 8 6* 5* <5*  ?CREATININE  --  0.57* 0.48* 0.56* 0.45* 0.40*  ?CALCIUM  --  8.2* 8.1* 8.2* 8.2* 7.6*  ?MG 1.9  --   --  1.6* 2.1 1.8  ? ? ?GFR: ?Estimated Creatinine Clearance: 53.7 mL/min (A) (by C-G formula based on SCr of 0.4 mg/dL (L)). ?Liver Function Tests: ?Recent Labs  ?Lab 05/19/21 ?0532 05/20/21 ?05/22/21 05/21/21 ?05/23/21 05/22/21 ?05/24/21 05/23/21 ?1055  ?AST 18 16 13* 12* 13*  ?ALT 16 15 13 11 11   ?ALKPHOS 50 53 48 47 54  ?BILITOT 1.3* 1.4* 1.1 0.4 0.6  ?PROT 5.5* 5.5* 5.3* 5.1* 5.5*  ?ALBUMIN 2.4* 2.4* 2.2* 2.1* 2.2*  ? ? ?No results for input(s): LIPASE, AMYLASE in the last 168 hours. ?No results for input(s): AMMONIA in the last 168 hours. ?Coagulation Profile: ?No results for input(s): INR, PROTIME in the last 168 hours. ? ?Cardiac Enzymes: ?No results for input(s): CKTOTAL, CKMB, CKMBINDEX, TROPONINI in the last 168 hours. ?BNP (last 3 results) ?No results for input(s): PROBNP in the last 8760 hours. ?HbA1C: ?No results for input(s): HGBA1C in the last 72 hours. ?CBG: ?No results for  input(s): GLUCAP in the last 168 hours. ?Lipid Profile: ?No results for input(s): CHOL, HDL, LDLCALC, TRIG, CHOLHDL, LDLDIRECT in the last 72 hours. ?Thyroid Function Tests: ?No results for input(s): TS

## 2021-05-25 ENCOUNTER — Emergency Department (HOSPITAL_COMMUNITY): Payer: No Typology Code available for payment source

## 2021-05-25 ENCOUNTER — Inpatient Hospital Stay (HOSPITAL_COMMUNITY)
Admission: EM | Admit: 2021-05-25 | Discharge: 2021-05-29 | DRG: 300 | Disposition: A | Discharge status: Skilled Nursing Facility | Payer: No Typology Code available for payment source | Source: Skilled Nursing Facility | Attending: Internal Medicine | Admitting: Internal Medicine

## 2021-05-25 ENCOUNTER — Other Ambulatory Visit: Payer: Self-pay

## 2021-05-25 DIAGNOSIS — E876 Hypokalemia: Secondary | ICD-10-CM | POA: Diagnosis present

## 2021-05-25 DIAGNOSIS — T380X5A Adverse effect of glucocorticoids and synthetic analogues, initial encounter: Secondary | ICD-10-CM | POA: Diagnosis present

## 2021-05-25 DIAGNOSIS — Z20822 Contact with and (suspected) exposure to covid-19: Secondary | ICD-10-CM | POA: Diagnosis present

## 2021-05-25 DIAGNOSIS — N5089 Other specified disorders of the male genital organs: Secondary | ICD-10-CM | POA: Diagnosis present

## 2021-05-25 DIAGNOSIS — F0154 Vascular dementia, unspecified severity, with anxiety: Secondary | ICD-10-CM | POA: Diagnosis present

## 2021-05-25 DIAGNOSIS — J45909 Unspecified asthma, uncomplicated: Secondary | ICD-10-CM | POA: Diagnosis present

## 2021-05-25 DIAGNOSIS — I82422 Acute embolism and thrombosis of left iliac vein: Principal | ICD-10-CM

## 2021-05-25 DIAGNOSIS — M109 Gout, unspecified: Secondary | ICD-10-CM | POA: Diagnosis present

## 2021-05-25 DIAGNOSIS — Z7901 Long term (current) use of anticoagulants: Secondary | ICD-10-CM

## 2021-05-25 DIAGNOSIS — I6930 Unspecified sequelae of cerebral infarction: Secondary | ICD-10-CM

## 2021-05-25 DIAGNOSIS — J690 Pneumonitis due to inhalation of food and vomit: Secondary | ICD-10-CM | POA: Diagnosis not present

## 2021-05-25 DIAGNOSIS — F01518 Vascular dementia, unspecified severity, with other behavioral disturbance: Secondary | ICD-10-CM | POA: Diagnosis present

## 2021-05-25 DIAGNOSIS — J9811 Atelectasis: Secondary | ICD-10-CM | POA: Diagnosis present

## 2021-05-25 DIAGNOSIS — I82412 Acute embolism and thrombosis of left femoral vein: Secondary | ICD-10-CM | POA: Diagnosis present

## 2021-05-25 DIAGNOSIS — G40909 Epilepsy, unspecified, not intractable, without status epilepticus: Secondary | ICD-10-CM | POA: Diagnosis present

## 2021-05-25 DIAGNOSIS — R1312 Dysphagia, oropharyngeal phase: Secondary | ICD-10-CM | POA: Diagnosis present

## 2021-05-25 DIAGNOSIS — F419 Anxiety disorder, unspecified: Secondary | ICD-10-CM | POA: Diagnosis present

## 2021-05-25 DIAGNOSIS — I69354 Hemiplegia and hemiparesis following cerebral infarction affecting left non-dominant side: Secondary | ICD-10-CM | POA: Diagnosis not present

## 2021-05-25 DIAGNOSIS — D638 Anemia in other chronic diseases classified elsewhere: Secondary | ICD-10-CM | POA: Diagnosis present

## 2021-05-25 DIAGNOSIS — E8770 Fluid overload, unspecified: Secondary | ICD-10-CM | POA: Diagnosis present

## 2021-05-25 DIAGNOSIS — I723 Aneurysm of iliac artery: Secondary | ICD-10-CM | POA: Diagnosis present

## 2021-05-25 DIAGNOSIS — Z888 Allergy status to other drugs, medicaments and biological substances status: Secondary | ICD-10-CM

## 2021-05-25 DIAGNOSIS — Z79899 Other long term (current) drug therapy: Secondary | ICD-10-CM

## 2021-05-25 DIAGNOSIS — M1990 Unspecified osteoarthritis, unspecified site: Secondary | ICD-10-CM | POA: Diagnosis present

## 2021-05-25 DIAGNOSIS — Z515 Encounter for palliative care: Secondary | ICD-10-CM | POA: Diagnosis not present

## 2021-05-25 DIAGNOSIS — Z87891 Personal history of nicotine dependence: Secondary | ICD-10-CM

## 2021-05-25 DIAGNOSIS — L89152 Pressure ulcer of sacral region, stage 2: Secondary | ICD-10-CM | POA: Diagnosis present

## 2021-05-25 DIAGNOSIS — R54 Age-related physical debility: Secondary | ICD-10-CM | POA: Diagnosis present

## 2021-05-25 DIAGNOSIS — Z7401 Bed confinement status: Secondary | ICD-10-CM | POA: Diagnosis not present

## 2021-05-25 DIAGNOSIS — A419 Sepsis, unspecified organism: Secondary | ICD-10-CM

## 2021-05-25 DIAGNOSIS — R627 Adult failure to thrive: Secondary | ICD-10-CM | POA: Diagnosis present

## 2021-05-25 DIAGNOSIS — D72828 Other elevated white blood cell count: Secondary | ICD-10-CM | POA: Diagnosis present

## 2021-05-25 DIAGNOSIS — J90 Pleural effusion, not elsewhere classified: Secondary | ICD-10-CM | POA: Diagnosis present

## 2021-05-25 DIAGNOSIS — Z7952 Long term (current) use of systemic steroids: Secondary | ICD-10-CM

## 2021-05-25 DIAGNOSIS — I10 Essential (primary) hypertension: Secondary | ICD-10-CM | POA: Diagnosis present

## 2021-05-25 DIAGNOSIS — Z880 Allergy status to penicillin: Secondary | ICD-10-CM

## 2021-05-25 DIAGNOSIS — R531 Weakness: Secondary | ICD-10-CM | POA: Diagnosis not present

## 2021-05-25 DIAGNOSIS — F03918 Unspecified dementia, unspecified severity, with other behavioral disturbance: Secondary | ICD-10-CM | POA: Diagnosis present

## 2021-05-25 DIAGNOSIS — Z8249 Family history of ischemic heart disease and other diseases of the circulatory system: Secondary | ICD-10-CM

## 2021-05-25 DIAGNOSIS — Z8349 Family history of other endocrine, nutritional and metabolic diseases: Secondary | ICD-10-CM

## 2021-05-25 DIAGNOSIS — N492 Inflammatory disorders of scrotum: Principal | ICD-10-CM

## 2021-05-25 DIAGNOSIS — Z7189 Other specified counseling: Secondary | ICD-10-CM | POA: Diagnosis not present

## 2021-05-25 LAB — RESP PANEL BY RT-PCR (FLU A&B, COVID) ARPGX2
Influenza A by PCR: NEGATIVE
Influenza B by PCR: NEGATIVE
SARS Coronavirus 2 by RT PCR: NEGATIVE

## 2021-05-25 LAB — CBC WITH DIFFERENTIAL/PLATELET
Abs Immature Granulocytes: 0.2 10*3/uL — ABNORMAL HIGH (ref 0.00–0.07)
Basophils Absolute: 0 10*3/uL (ref 0.0–0.1)
Basophils Relative: 0 %
Eosinophils Absolute: 0 10*3/uL (ref 0.0–0.5)
Eosinophils Relative: 0 %
HCT: 32 % — ABNORMAL LOW (ref 39.0–52.0)
Hemoglobin: 10.5 g/dL — ABNORMAL LOW (ref 13.0–17.0)
Immature Granulocytes: 1 %
Lymphocytes Relative: 8 %
Lymphs Abs: 1.3 10*3/uL (ref 0.7–4.0)
MCH: 28.2 pg (ref 26.0–34.0)
MCHC: 32.8 g/dL (ref 30.0–36.0)
MCV: 85.8 fL (ref 80.0–100.0)
Monocytes Absolute: 0.8 10*3/uL (ref 0.1–1.0)
Monocytes Relative: 5 %
Neutro Abs: 14.4 10*3/uL — ABNORMAL HIGH (ref 1.7–7.7)
Neutrophils Relative %: 86 %
Platelets: 254 10*3/uL (ref 150–400)
RBC: 3.73 MIL/uL — ABNORMAL LOW (ref 4.22–5.81)
RDW: 18.2 % — ABNORMAL HIGH (ref 11.5–15.5)
WBC: 16.8 10*3/uL — ABNORMAL HIGH (ref 4.0–10.5)
nRBC: 0 % (ref 0.0–0.2)

## 2021-05-25 LAB — COMPREHENSIVE METABOLIC PANEL
ALT: 13 U/L (ref 0–44)
AST: 21 U/L (ref 15–41)
Albumin: 2.2 g/dL — ABNORMAL LOW (ref 3.5–5.0)
Alkaline Phosphatase: 60 U/L (ref 38–126)
Anion gap: 5 (ref 5–15)
BUN: 9 mg/dL (ref 8–23)
CO2: 27 mmol/L (ref 22–32)
Calcium: 8.5 mg/dL — ABNORMAL LOW (ref 8.9–10.3)
Chloride: 114 mmol/L — ABNORMAL HIGH (ref 98–111)
Creatinine, Ser: 0.57 mg/dL — ABNORMAL LOW (ref 0.61–1.24)
GFR, Estimated: >60 mL/min (ref >60)
Glucose, Bld: 130 mg/dL — ABNORMAL HIGH (ref 70–99)
Potassium: 3.6 mmol/L (ref 3.5–5.1)
Sodium: 146 mmol/L — ABNORMAL HIGH (ref 135–145)
Total Bilirubin: 0.5 mg/dL (ref 0.3–1.2)
Total Protein: 5.9 g/dL — ABNORMAL LOW (ref 6.5–8.1)

## 2021-05-25 LAB — BASIC METABOLIC PANEL
Anion gap: 4 — ABNORMAL LOW (ref 5–15)
BUN: 7 mg/dL — ABNORMAL LOW (ref 8–23)
CO2: 28 mmol/L (ref 22–32)
Calcium: 8 mg/dL — ABNORMAL LOW (ref 8.9–10.3)
Chloride: 113 mmol/L — ABNORMAL HIGH (ref 98–111)
Creatinine, Ser: 0.51 mg/dL — ABNORMAL LOW (ref 0.61–1.24)
GFR, Estimated: >60 mL/min (ref >60)
Glucose, Bld: 111 mg/dL — ABNORMAL HIGH (ref 70–99)
Potassium: 3.3 mmol/L — ABNORMAL LOW (ref 3.5–5.1)
Sodium: 145 mmol/L (ref 135–145)

## 2021-05-25 LAB — APTT: aPTT: 32 seconds (ref 24–36)

## 2021-05-25 LAB — LACTIC ACID, PLASMA
Lactic Acid, Venous: 2.4 mmol/L (ref 0.5–1.9)
Lactic Acid, Venous: 3.6 mmol/L (ref 0.5–1.9)

## 2021-05-25 LAB — PROTIME-INR
INR: 1 (ref 0.8–1.2)
Prothrombin Time: 13.1 seconds (ref 11.4–15.2)

## 2021-05-25 LAB — MAGNESIUM: Magnesium: 1.8 mg/dL (ref 1.7–2.4)

## 2021-05-25 MED ORDER — POTASSIUM CHLORIDE 20 MEQ PO PACK
40.0000 meq | PACK | ORAL | Status: DC
  Administered 2021-05-25: 40 meq via ORAL
  Filled 2021-05-25: qty 2

## 2021-05-25 MED ORDER — BUSPIRONE HCL 5 MG PO TABS
2.5000 mg | ORAL_TABLET | Freq: Two times a day (BID) | ORAL | Status: DC
  Administered 2021-05-26 – 2021-05-29 (×6): 2.5 mg via ORAL
  Filled 2021-05-25 (×8): qty 1

## 2021-05-25 MED ORDER — SODIUM CHLORIDE 0.9 % IV SOLN
1.0000 g | INTRAVENOUS | Status: DC
  Administered 2021-05-26 – 2021-05-29 (×4): 1 g via INTRAVENOUS
  Filled 2021-05-25 (×4): qty 10

## 2021-05-25 MED ORDER — METRONIDAZOLE 500 MG/100ML IV SOLN
500.0000 mg | Freq: Once | INTRAVENOUS | Status: AC
  Administered 2021-05-25: 500 mg via INTRAVENOUS
  Filled 2021-05-25: qty 100

## 2021-05-25 MED ORDER — IOHEXOL 300 MG/ML  SOLN
100.0000 mL | Freq: Once | INTRAMUSCULAR | Status: AC | PRN
  Administered 2021-05-25: 100 mL via INTRAVENOUS

## 2021-05-25 MED ORDER — LEVETIRACETAM 500 MG PO TABS
500.0000 mg | ORAL_TABLET | Freq: Two times a day (BID) | ORAL | Status: DC
  Filled 2021-05-25 (×2): qty 1

## 2021-05-25 MED ORDER — BACLOFEN 10 MG PO TABS
5.0000 mg | ORAL_TABLET | Freq: Three times a day (TID) | ORAL | Status: DC
  Administered 2021-05-26 – 2021-05-29 (×10): 5 mg via ORAL
  Filled 2021-05-25 (×11): qty 1

## 2021-05-25 MED ORDER — PANTOPRAZOLE SODIUM 40 MG PO TBEC
40.0000 mg | DELAYED_RELEASE_TABLET | Freq: Every day | ORAL | Status: DC
Start: 2021-05-26 — End: 2021-05-29
  Administered 2021-05-27 – 2021-05-29 (×2): 40 mg via ORAL
  Filled 2021-05-25 (×4): qty 1

## 2021-05-25 MED ORDER — LORAZEPAM 0.5 MG PO TABS
0.5000 mg | ORAL_TABLET | Freq: Two times a day (BID) | ORAL | Status: DC
  Administered 2021-05-26 – 2021-05-29 (×6): 0.5 mg via ORAL
  Filled 2021-05-25 (×8): qty 1

## 2021-05-25 MED ORDER — DEXTROSE-NACL 5-0.45 % IV SOLN: INTRAVENOUS | Status: DC

## 2021-05-25 MED ORDER — BACLOFEN 5 MG PO TABS: 7.5000 mg | ORAL_TABLET | Freq: Three times a day (TID) | ORAL | Status: DC

## 2021-05-25 MED ORDER — ATORVASTATIN CALCIUM 40 MG PO TABS
80.0000 mg | ORAL_TABLET | Freq: Every day | ORAL | Status: DC
  Administered 2021-05-26 – 2021-05-28 (×4): 80 mg via ORAL
  Filled 2021-05-25 (×4): qty 2

## 2021-05-25 MED ORDER — ACETAMINOPHEN 650 MG RE SUPP: 650.0000 mg | Freq: Four times a day (QID) | RECTAL | Status: DC | PRN

## 2021-05-25 MED ORDER — SODIUM CHLORIDE 0.9% FLUSH
3.0000 mL | Freq: Two times a day (BID) | INTRAVENOUS | Status: DC
  Administered 2021-05-25 – 2021-05-29 (×7): 3 mL via INTRAVENOUS

## 2021-05-25 MED ORDER — VANCOMYCIN HCL IN DEXTROSE 1-5 GM/200ML-% IV SOLN
1000.0000 mg | INTRAVENOUS | Status: DC
Start: 2021-05-26 — End: 2021-05-26

## 2021-05-25 MED ORDER — LACTATED RINGERS IV BOLUS (SEPSIS)
250.0000 mL | Freq: Once | INTRAVENOUS | Status: AC
  Administered 2021-05-25: 250 mL via INTRAVENOUS

## 2021-05-25 MED ORDER — LACTATED RINGERS IV BOLUS (SEPSIS)
1000.0000 mL | Freq: Once | INTRAVENOUS | Status: AC
  Administered 2021-05-25: 1000 mL via INTRAVENOUS

## 2021-05-25 MED ORDER — SODIUM CHLORIDE 0.9 % IV SOLN
2.0000 g | Freq: Once | INTRAVENOUS | Status: AC
  Administered 2021-05-25: 2 g via INTRAVENOUS
  Filled 2021-05-25: qty 12.5

## 2021-05-25 MED ORDER — LACTATED RINGERS IV BOLUS (SEPSIS)
500.0000 mL | Freq: Once | INTRAVENOUS | Status: AC
  Administered 2021-05-25: 500 mL via INTRAVENOUS

## 2021-05-25 MED ORDER — HEPARIN BOLUS VIA INFUSION
3000.0000 [IU] | Freq: Once | INTRAVENOUS | Status: AC
  Administered 2021-05-25: 3000 [IU] via INTRAVENOUS
  Filled 2021-05-25: qty 3000

## 2021-05-25 MED ORDER — HEPARIN (PORCINE) 25000 UT/250ML-% IV SOLN
900.0000 [IU]/h | INTRAVENOUS | Status: DC
  Administered 2021-05-25: 900 [IU]/h via INTRAVENOUS
  Filled 2021-05-25: qty 250

## 2021-05-25 MED ORDER — POTASSIUM CHLORIDE 10 MEQ/100ML IV SOLN
10.0000 meq | INTRAVENOUS | Status: AC
  Administered 2021-05-25 (×4): 10 meq via INTRAVENOUS
  Filled 2021-05-25 (×4): qty 100

## 2021-05-25 MED ORDER — ACETAMINOPHEN 325 MG PO TABS
650.0000 mg | ORAL_TABLET | Freq: Four times a day (QID) | ORAL | Status: DC | PRN
  Administered 2021-05-28: 650 mg via ORAL

## 2021-05-25 MED ORDER — MORPHINE SULFATE (PF) 4 MG/ML IV SOLN
4.0000 mg | Freq: Once | INTRAVENOUS | Status: AC
  Administered 2021-05-25: 4 mg via INTRAVENOUS
  Filled 2021-05-25: qty 1

## 2021-05-25 MED ORDER — LACTATED RINGERS IV SOLN: INTRAVENOUS | Status: DC

## 2021-05-25 MED ORDER — GABAPENTIN 100 MG PO CAPS
100.0000 mg | ORAL_CAPSULE | Freq: Two times a day (BID) | ORAL | Status: DC
  Administered 2021-05-26 – 2021-05-29 (×6): 100 mg via ORAL
  Filled 2021-05-25 (×8): qty 1

## 2021-05-25 MED ORDER — VANCOMYCIN HCL IN DEXTROSE 1-5 GM/200ML-% IV SOLN
1000.0000 mg | Freq: Once | INTRAVENOUS | Status: DC
  Filled 2021-05-25: qty 200

## 2021-05-25 MED ORDER — VANCOMYCIN HCL 1500 MG/300ML IV SOLN
1500.0000 mg | Freq: Once | INTRAVENOUS | Status: AC
  Administered 2021-05-25: 1500 mg via INTRAVENOUS
  Filled 2021-05-25: qty 300

## 2021-05-25 MED ORDER — PREDNISONE 20 MG PO TABS
20.0000 mg | ORAL_TABLET | Freq: Every day | ORAL | Status: DC
  Administered 2021-05-26: 20 mg via ORAL
  Filled 2021-05-25: qty 1

## 2021-05-25 NOTE — Assessment & Plan Note (Signed)
Continue Keppra.

## 2021-05-25 NOTE — Plan of Care (Signed)

## 2021-05-25 NOTE — Progress Notes (Signed)
A consult was received from an ED physician for cefepime and vancomycin per pharmacy dosing.  The patient's profile has been reviewed for ht/wt/allergies/indication/available labs.   ? ?Agree with cefepime 2 g IV dose enterented by provider.   ?A one time order has been placed for vancomycin 1500 mg IV.   ? ?Further antibiotics/pharmacy consults should be ordered by admitting physician if indicated.       ?                ?Selinda Eon, PharmD, BCPS ?Clinical Pharmacist ?[place] ?Please utilize Amion for appropriate phone number to reach the unit pharmacist [facility]) ?05/25/2021 6:32 PM ? ?

## 2021-05-25 NOTE — Assessment & Plan Note (Signed)
Chronic vascular dementia at risk for delirium.  Placed on delirium precautions. ?

## 2021-05-25 NOTE — Assessment & Plan Note (Signed)
Continue home Ativan 0.5 mg twice daily. ?

## 2021-05-25 NOTE — Progress Notes (Signed)
Report called to [facility] nursing facility. Recent set of vitals given to nurse, BP elevated, this nurse to admin PRN hydralazine for elevated BP prior to dc. IV's removed, patient left via stretcher with PTAR in good condition. ?

## 2021-05-25 NOTE — Assessment & Plan Note (Signed)
Not currently on antihypertensives.  BP mildly elevated but acceptable at this time. ?

## 2021-05-25 NOTE — ED Notes (Signed)
Attempted multiple times for second set of cultures and second line, unsuccessfully. Did not want to delay treatment, continued on.  ?

## 2021-05-25 NOTE — Discharge Summary (Signed)
PatientPhysician Discharge Summary  ?Johnathan Mitchell. [id number] DOB: 12/09/1934 DOA: 05/16/2021 ? ?[facility] ? ?Admit date: 05/16/2021 ?Discharge date: 05/25/2021 ?30 Day Unplanned Readmission Risk Score   ? ?Flowsheet Row ED to Hosp-Admission (Current) from 05/16/2021 in [place]  ?30 Day Unplanned Readmission Risk Score (%) 27.36 Filed at 05/25/2021 0801  ? ?  ? ? This score is the patient's risk of an unplanned readmission within 30 days of being discharged (0 -100%). The score is based on dignosis, age, lab data, medications, orders, and past utilization.   ?Low:  0-14.9   Medium: 15-21.9   High: 22-29.9   Extreme: 30 and above ? ?  ? ?  ? ? ? ?Admitted From: SNF ?Disposition: SNF ? ?Recommendations for Outpatient Follow-up:  ?Follow up with PCP in 1-2 weeks ?Please obtain BMP/CBC in one week ?Please follow up with your PCP on the following pending results: ?Unresulted Labs (From admission, onward)  ? ?  Start     Ordered  ? 05/23/21 0500  Creatinine, serum  (enoxaparin (LOVENOX)    CrCl >/= 30 ml/min)  Weekly,   R     ?Comments: while on enoxaparin therapy ?  ? 05/16/21 1727  ? ?  ?  ? ?  ?  ? ? ?Home Health: None ?Equipment/Devices: None ? ?Discharge Condition: Stable ?CODE STATUS: Full code ?Diet recommendation: Dysphagia 1(pur?e) solids, nectar thick liquid ? ?Subjective: Seen and examined.  Alert but not oriented at his baseline.  No complaints.  Looks comfortable. ? ?Brief/Interim Summary: 87 y.o. male with medical history significant of aspiration PNA, HTN, seizures, asthma, prior CVA who presented from facility with concerns of recurrent aspiration PNA.  Per report, pt was recently admitted for PNA and discharged to SNF. Pt noted to be acutely hypoxemic with O2 sats down to 78% on RA, requiring supplemental O2. On presentation to ED, pt had CXR performed which demonstrated hazy opacities in the B lung bases, reviewed. Pt was given dose of rocephin and hospitalist  consulted for admission.  Details of hospitalization as below. ?  ?Aspiration pneumonia ([place]) ? Patient completed antibiotic course with Rocephin and Zithromax.  He has been on room air for last 2 to 3 days. ?  ?Hypomagnesemia: Resolved. ?  ?Seizure ([place]) ?-No evidence of seizure activity at this time ?-Continue home dose of Keppra. ?  ?Hypokalemia: Patient had persistent hypokalemia which was replenished but patient continued to have tendency to spit the medications out.  Finally his potassium is 2.3 today.  Patient will receive 2 more doses of potassium replacement today before discharge. ?  ?AKI (acute kidney injury) ([place]) ?-Presenting Cr of 1.58, baseline Cr <1 ?-Cr normalized with IVF ?  ?Dementia with behavioral disturbance [facility]): Patient did talk to me today.  Speech was partially comprehensible but that is his baseline.  He appeared calm. ?  ?Benign essential HTN ?-BP stable at this time ?-Cont on PRN hydralazine as needed ?  ?Cerebral infarction [facility]) ?-Seems stable at this time ? ?Discharge plan was discussed with patient and/or family member and they verbalized understanding and agreed with it.  ?Discharge Diagnoses:  ?Principal Problem: ?  Aspiration pneumonia ([place]) ?Active Problems: ?  HCAP (healthcare-associated pneumonia) ?  Cerebral infarction [facility]) ?  Benign essential HTN ?  Dementia with behavioral disturbance ([place]) ?  AKI (acute kidney injury) ([place]) ?  Hypokalemia ?  Seizure ([place]) ?  Goals of care, counseling/discussion ?  Palliative care by specialist ?  Hypomagnesemia ? ? ? ?Discharge Instructions ? ? ?Allergies as of 05/25/2021   ? ?   Reactions  ? Penicillins Hives, Swelling  ? Valproate Sodium   ? Other reaction(s): Other (See Comments)  ? Lexapro [escitalopram] Diarrhea  ? ?  ? ?  ?Medication List  ?  ? ?STOP taking these medications   ? ?aspirin EC 81 MG tablet ?  ?LORazepam 0.5 MG tablet ?Commonly known as: ATIVAN ?  ? ?  ? ?TAKE these medications   ? ?acetaminophen 500 MG tablet ?Commonly  known as: TYLENOL ?Take 1 tablet (500 mg total) by mouth every 8 (eight) hours. ?What changed:  ?how much to take ?when to take this ?  ?atorvastatin 80 MG tablet ?Commonly known as: LIPITOR ?Take 1 tablet (80 mg total) by mouth daily at 6 PM. ?What changed: when to take this ?  ?Baclofen 5 MG Tabs ?Take 7.5 mg by mouth 3 (three) times daily. ?  ?busPIRone 5 MG tablet ?Commonly known as: BUSPAR ?Take 2.5 mg by mouth 2 (two) times daily. ?  ?Cholecalciferol 25 MCG (1000 UT) capsule ?Take 1,000 Units by mouth daily. ?  ?diclofenac Sodium 1 % Gel ?Commonly known as: VOLTAREN ?Apply 2 g topically 2 (two) times daily as needed for pain. ?  ?feeding supplement (PRO-STAT SUGAR FREE 64) Liqd ?Take 30 mLs by mouth 2 (two) times daily. ?  ?gabapentin 100 MG capsule ?Commonly known as: NEURONTIN ?Take 100 mg by mouth 2 (two) times daily. ?  ?levETIRAcetam 500 MG tablet ?Commonly known as: Keppra ?Take 1 tablet (500 mg total) by mouth 2 (two) times daily. ?  ?multivitamin with minerals Tabs tablet ?Take 1 tablet by mouth daily. ?  ?potassium chloride 10 MEQ tablet ?Commonly known as: KLOR-CON M ?Take 10 mEq by mouth daily. ?What changed: Another medication with the same name was removed. Continue taking this medication, and follow the directions you see here. ?  ?predniSONE 10 MG tablet ?Commonly known as: DELTASONE ?Take 20 mg by mouth daily with breakfast. ?  ?silver sulfADIAZINE 1 % cream ?Commonly known as: SILVADENE ?Apply 1 application. topically 2 (two) times daily. Apply to buttock ?  ?vitamin B-12 1000 MCG tablet ?Commonly known as: CYANOCOBALAMIN ?Take 1,000 mcg by mouth daily. ?  ? ?  ? ? Follow-up Information   ? ? [facility] Follow up in 1 week(s).   ?Contact information: ?[address] ?[place] [address] ?[phone] ? ? ?  ?  ? ?  ?  ? ?  ? ?Allergies  ?Allergen Reactions  ? Penicillins Hives and Swelling  ? Valproate Sodium   ?  Other reaction(s): Other (See Comments)  ? Lexapro [Escitalopram]  Diarrhea  ? ? ?Consultations: Palliative care ? ? ?Procedures/Studies: ?DG Chest Port 1 View ? ?Result Date: 05/16/2021 ?CLINICAL DATA:  Questionable sepsis. EXAM: PORTABLE CHEST 1 VIEW COMPARISON:  March 12, 2021. FINDINGS: The heart size and mediastinal contours are within normal limits. Hazy opacities in the bilateral lung bases may reflect atelectasis or consolidation. No pleural effusion. No pneumothorax. Degenerative changes spine and bilateral shoulders. IMPRESSION: Hazy opacities in the bilateral lung bases are favored to reflect atelectasis though infection not excluded. Electronically Signed   By: Dahlia Bailiff M.D.   On: 05/16/2021 16:00  ? ?DG Swallowing Func-Speech Pathology ? ?Result Date: 05/22/2021 ?Table formatting from the original result was not included. Modified Barium Swallow Progress Note  Patient Details Name: Johnathan Mitchell. MRN: [id number] Date of Birth: Jun 27, 1934  Today's  Date: 05/18/2021  05/18/21 0826 Pain Assessment Pain Assessment Faces Breathing 0 Negative Vocalization 1 Facial Expression 0 Body Language 0 Consolability 0 PAINAD Score 1 General Information HPI 87 yo male adm [facility] with recurrent asp from SNF. He is non verbal and has h/o CVA and is nonverbal. Hazy opacities in the bilateral lung bases are favored to reflect atelectasis though infection not excluded. CXR 4/19.  Pt recently at [facility] and underwent SLP swallow eval - Recommended diet was dys2/thin - Pt with subtle cough during session on prior evaluation but SLP felt not coorelated to po intake.  Per prior imaging, pt has Old left basal ganglia and periventricular white matter lacunar infarcts. Type of Study MBS-Modified Barium Swallow Study Previous Swallow Assessment prior bses Diet Prior to this Study NPO;IV Temperature Spikes Noted No Respiratory Status Nasal cannula History of Recent Intubation No Behavior/Cognition Alert;Cooperative Oral Cavity Assessment Dry Oral Care Completed by SLP No Oral Cavity - Dentition  Missing dentition (some dentition missing) Vision Functional for self feeding Self-Feeding Abilities Total assist Patient Positioning Upright in chair Baseline Vocal Quality Wet Volitional Cough Cognitive

## 2021-05-25 NOTE — H&P (Signed)
?History and Physical  ? ? ?Johnathan Mitchell. ASN:[id number] DOB: 03-Aug-1934 DOA: 05/25/2021 ? ?[facility]  ?Patient coming from: [facility] ? ?I have personally briefly reviewed patient's old medical records in [facility] ? ?Chief Complaint: Scrotal swelling ? ?HPI: ?Johnathan Mitchell. is a 87 y.o. male with medical history significant for vascular dementia, history of CVA with left-sided deficits, seizure disorder, recurrent aspiration pneumonia, HTN, anemia of chronic disease, anxiety who presented to the ED from SNF for evaluation of scrotal swelling.  History is limited from patient and is otherwise supplemented by EDP ? ?Patient was just admitted 05/16/2021-05/25/2021 for hypoxia due to aspiration pneumonia.  He was treated with ceftriaxone and azithromycin and was weaned to room air.  He had associated AKI.  He was discharged to his SNF this morning. ? ?At the SNF he was noted to have significant penile and scrotal swelling.  He was subsequently sent back to the ED for further evaluation.  Per discussion with patient's daughter, he is chronically bedbound.  Patient states that he has been having right-sided pain.  He says that he cannot move his legs due to prior stroke. ? ?ED Course  Labs/Imaging on admission: I have personally reviewed following labs and imaging studies. ? ?Initial vitals showed BP 189/94, pulse 97, RR 18, temp 98.0 ?F, SPO2 97% on room air.  While in the ED patient was tachycardic with pulse up to 121 and tachypneic with RR up to 35. ? ?Labs show WBC 16.8, hemoglobin 10.5, platelets 254,000, sodium 146, potassium 3.6, bicarb 27, BUN 9, creatinine 0.57, serum glucose 130, LFTs within normal limits, lactic acid 2.4. ? ?SARS-CoV-2 and influenza PCR negative.  Blood and urine cultures ordered and pending. ? ?Portable chest x-ray showed mild bilateral upper lobe and right basilar infiltrate. ? ?CT abdomen/pelvis with contrast showed extensive DVT involving left  external iliac, common femoral, and femoral/profundus femoral veins.  Aneurysmal dilatation of right common iliac and internal iliac arteries as well as left internal iliac artery seen.  Bibasilar pleural effusions with atelectasis noted.  Diffuse soft tissue swelling of subcutaneous soft tissues and within abdomen with marked soft tissue swelling of the scrotum, penis, perineum seen. ? ?Patient was given 1.75 L LR, IV vancomycin, cefepime, Flagyl.  EDP discussed with on-call urology who will see in consultation.  EDP discussed with on-call vascular surgery, Dr. Sherral Hammers, who felt DVT can be treated with anticoagulation and aneurysm can be followed up as an outpatient.  Patient was started on IV heparin.  The hospitalist service was consulted to admit for further evaluation and management. ? ?Review of Systems:  ?Full review of systems limited due to patient's dementia. ? ? ?Past Medical History:  ?Diagnosis Date  ? Asthma   ? Dementia (HCC)   ? Gout   ? Hypertension   ? Seizures (HCC)   ? Stroke [facility])   ? ? ?Past Surgical History:  ?Procedure Laterality Date  ? BACK SURGERY    ? FEMUR IM NAIL Left 05/13/2017  ? Procedure: INTRAMEDULLARY (IM) NAIL FEMORAL;  Surgeon: [name], MD;  Location: [facility] OR;  Service: Orthopedics;  Laterality: Left;  ? KNEE SURGERY    ? ? ?Social History: ? reports that he has quit smoking. He has never used smokeless tobacco. He reports that he does not drink alcohol and does not use drugs. ? ?Allergies  ?Allergen Reactions  ? Penicillins Hives and Swelling  ? Lactose Intolerance (Gi) Other (See Comments)  ?  unknown  ? Valproate Sodium   ?  Other reaction(s): Other (See Comments)  ? Lexapro [Escitalopram] Diarrhea  ? ? ?Family History  ?Problem Relation Age of Onset  ? Hyperlipidemia Mother   ? Hypertension Mother   ? ? ? ?Prior to Admission medications   ?Medication Sig Start Date End Date Taking? Authorizing Provider  ?acetaminophen (TYLENOL) 500 MG tablet Take 1 tablet (500 mg total)  by mouth every 8 (eight) hours. ?Patient taking differently: Take 1,000 mg by mouth 3 (three) times daily. 12/20/19   Almon HerculesGonfa, Taye T, MD  ?Amino Acids-Protein Hydrolys (FEEDING SUPPLEMENT, PRO-STAT SUGAR FREE 64,) LIQD Take 30 mLs by mouth 2 (two) times daily.    [provider]  ?atorvastatin (LIPITOR) 80 MG tablet Take 1 tablet (80 mg total) by mouth daily at 6 PM. ?Patient taking differently: Take 80 mg by mouth at bedtime. 08/20/17   Medina-Vargas, Margit BandaMonina C, NP  ?Baclofen 5 MG TABS Take 7.5 mg by mouth 3 (three) times daily. 05/23/21   Hughie ClossPahwani, Ravi, MD  ?busPIRone (BUSPAR) 5 MG tablet Take 2.5 mg by mouth 2 (two) times daily.    [provider]  ?Cholecalciferol 25 MCG (1000 UT) capsule Take 1,000 Units by mouth daily.    [provider]  ?diclofenac Sodium (VOLTAREN) 1 % GEL Apply 2 g topically 2 (two) times daily as needed for pain. 08/16/20   [provider]  ?gabapentin (NEURONTIN) 100 MG capsule Take 100 mg by mouth 2 (two) times daily.    [provider]  ?levETIRAcetam (KEPPRA) 500 MG tablet Take 1 tablet (500 mg total) by mouth 2 (two) times daily. 08/08/20   Lorre NickAllen, Anthony, MD  ?Multiple Vitamin (MULTIVITAMIN WITH MINERALS) TABS tablet Take 1 tablet by mouth daily.    [provider]  ?potassium chloride (KLOR-CON M) 10 MEQ tablet Take 10 mEq by mouth daily.    [provider]  ?predniSONE (DELTASONE) 10 MG tablet Take 20 mg by mouth daily with breakfast.    [provider]  ?silver sulfADIAZINE (SILVADENE) 1 % cream Apply 1 application. topically 2 (two) times daily. Apply to buttock    [provider]  ?vitamin B-12 (CYANOCOBALAMIN) 1000 MCG tablet Take 1,000 mcg by mouth daily.    [provider]  ? ? ?Physical Exam: ?Vitals:  ? 05/25/21 2055 05/25/21 2100 05/25/21 2115 05/25/21 2300  ?BP:  (!) 129/95 (!) 137/96 (!) 148/91  ?Pulse:    99  ?Resp:  (!) 23 19 16   ?Temp: 98.4 ?F (36.9 ?C) 99.7 ?F (37.6 ?C)    ?TempSrc:  Axillary Axillary    ?SpO2:    95%  ?Weight:      ?Height:      ? ?Exam limited due to dementia ?Constitutional: Chronically ill-appearing man resting in bed ?Eyes: lids and conjunctivae normal ?ENMT: Mucous membranes are dry. Posterior pharynx clear of any exudate or lesions. ?Neck: normal, supple, no masses. ?Respiratory: clear to auscultation anteriorly.  Normal respiratory effort. No accessory muscle use.  ?Cardiovascular: Regular rate and rhythm.  +1 left lower extremity edema, no edema RLE.  ?Abdomen: no tenderness, no masses palpated. No hepatosplenomegaly. ?Musculoskeletal: Contracture of the left hand/wrist, not moving lower extremities ?Skin: Dry ?Neurologic: Sensation intact. Strength 5/5 right upper extremity, diminished grip strength on the left, not moving right or left lower extremities ?Psychiatric: Awake, alert, oriented to self and knows that he is in the hospital.  Not oriented to year or situation. ? ?EKG: Personally reviewed. Sinus tachycardia,  rate 115, low voltage, QTc prolongation, motion artifact.  Rate is faster when compared to prior. ? ?Assessment/Plan ?Principal Problem: ?  Acute deep vein thrombosis (DVT) of iliac vein of left lower extremity (HCC) ?Active Problems: ?  Scrotal edema ?  History of cerebrovascular accident (CVA) with residual deficit ?  HTN (hypertension) ?  Seizure disorder (HCC) ?  Dementia with behavioral disturbance (HCC) ?  Aneurysm of right common iliac artery (HCC) ?  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  Anxiety ?  Osteoarthritis ?  ?Johnathan Mitchell. is a 87 y.o. male with medical history significant for vascular dementia, history of CVA with left-sided deficits, seizure disorder, recurrent aspiration pneumonia, HTN, anemia of chronic disease, anxiety who is admitted with extensive acute DVT of the left lower extremity and penile/scrotal edema with possible superimposed cellulitis. ? ?Assessment and  Plan: ?* Acute deep vein thrombosis (DVT) of iliac vein of left lower extremity (HCC) ?Acute DVT left external iliac, common femoral, and femoral/profundus femoral veins ?Aneurysmal dilatation right common and in

## 2021-05-25 NOTE — ED Provider Notes (Addendum)
?[facility]-EMERGENCY DEPT ?Provider Note ? ? ?CSN: [id number] ?Arrival date & time: 05/25/21  1702 ? ?  ? ?History ? ?No chief complaint on file. ? ? ?Johnathan Mitchell. is a 87 y.o. male history of stroke, hypertension, recent HCAP, here presenting with scrotal swelling.  Patient was just in the hospital and discharged to [facility] today.  He finished a course of Rocephin and azithromycin for pneumonia.  Patient has AKI as well when he was in the hospital and that resolved with IV fluids.  When patient went to [facility], he was noted to have significant scrotal swelling.  Patient unable to give me much history.  He is bedbound at baseline.  ? ?The history is provided by the patient.  ? ?  ? ?Home Medications ?Prior to Admission medications   ?Medication Sig Start Date End Date Taking? Authorizing Provider  ?acetaminophen (TYLENOL) 500 MG tablet Take 1 tablet (500 mg total) by mouth every 8 (eight) hours. ?Patient taking differently: Take 1,000 mg by mouth 3 (three) times daily. 12/20/19   Almon Hercules, MD  ?Amino Acids-Protein Hydrolys (FEEDING SUPPLEMENT, PRO-STAT SUGAR FREE 64,) LIQD Take 30 mLs by mouth 2 (two) times daily.    [provider]  ?atorvastatin (LIPITOR) 80 MG tablet Take 1 tablet (80 mg total) by mouth daily at 6 PM. ?Patient taking differently: Take 80 mg by mouth at bedtime. 08/20/17   Medina-Vargas, Margit Banda, NP  ?Baclofen 5 MG TABS Take 7.5 mg by mouth 3 (three) times daily. 05/23/21   Hughie Closs, MD  ?busPIRone (BUSPAR) 5 MG tablet Take 2.5 mg by mouth 2 (two) times daily.    [provider]  ?Cholecalciferol 25 MCG (1000 UT) capsule Take 1,000 Units by mouth daily.    [provider]  ?diclofenac Sodium (VOLTAREN) 1 % GEL Apply 2 g topically 2 (two) times daily as needed for pain. 08/16/20   [provider]  ?gabapentin (NEURONTIN) 100 MG capsule Take 100 mg by mouth 2 (two) times daily.    [provider]  ?levETIRAcetam  (KEPPRA) 500 MG tablet Take 1 tablet (500 mg total) by mouth 2 (two) times daily. 08/08/20   Lorre Nick, MD  ?Multiple Vitamin (MULTIVITAMIN WITH MINERALS) TABS tablet Take 1 tablet by mouth daily.    [provider]  ?potassium chloride (KLOR-CON M) 10 MEQ tablet Take 10 mEq by mouth daily.    [provider]  ?predniSONE (DELTASONE) 10 MG tablet Take 20 mg by mouth daily with breakfast.    [provider]  ?silver sulfADIAZINE (SILVADENE) 1 % cream Apply 1 application. topically 2 (two) times daily. Apply to buttock    [provider]  ?vitamin B-12 (CYANOCOBALAMIN) 1000 MCG tablet Take 1,000 mcg by mouth daily.    [provider]  ?   ? ?Allergies    ?Penicillins, Valproate sodium, and Lexapro [escitalopram]   ? ?Review of Systems   ?Review of Systems  ?Genitourinary:  Positive for scrotal swelling.  ?All other systems reviewed and are negative. ? ?Physical Exam ?Updated Vital Signs ?BP 130/83 (BP Location: Right Arm)   Pulse (!) 117   Temp 98 ?F (36.7 ?C) (Oral)   Resp 18   Ht 5\' 6"  (1.676 m)   Wt 55 kg   SpO2 93%   BMI 19.57 kg/m?  ?Physical Exam ?Vitals and nursing note reviewed.  ?Constitutional:   ?   Comments: Chronically ill  ?HENT:  ?  Head: Normocephalic.  ?   Nose: Nose normal.  ?   Mouth/Throat:  ?   Mouth: Mucous membranes are dry.  ?Eyes:  ?   Extraocular Movements: Extraocular movements intact.  ?   Pupils: Pupils are equal, round, and reactive to light.  ?Cardiovascular:  ?   Rate and Rhythm: Regular rhythm. Tachycardia present.  ?   Pulses: Normal pulses.  ?Pulmonary:  ?   Effort: Pulmonary effort is normal.  ?   Breath sounds: Normal breath sounds.  ?Abdominal:  ?   General: Abdomen is flat.  ?   Palpations: Abdomen is soft.  ?Genitourinary: ?   Comments: Patient has significant penile swelling.  Patient is uncircumcised.  Patient also has significant scrotal swelling.  Scrotum is the size of a tennis ball bilaterally.  Patient has stage II  sacral decub ulcer. ?Musculoskeletal:  ?   Cervical back: Normal range of motion and neck supple.  ?Skin: ?   General: Skin is warm.  ?Neurological:  ?   General: No focal deficit present.  ?   Mental Status: He is oriented to person, place, and time.  ?Psychiatric:     ?   Mood and Affect: Mood normal.     ?   Behavior: Behavior normal.  ? ? ? ? ? ?ED Results / Procedures / Treatments   ?Labs ?(all labs ordered are listed, but only abnormal results are displayed) ?Labs Reviewed  ?RESP PANEL BY RT-PCR (FLU A&B, COVID) [id number]  ?CULTURE, BLOOD (ROUTINE X 2)  ?CULTURE, BLOOD (ROUTINE X 2)  ?URINE CULTURE  ?LACTIC ACID, PLASMA  ?LACTIC ACID, PLASMA  ?COMPREHENSIVE METABOLIC PANEL  ?CBC WITH DIFFERENTIAL/PLATELET  ?PROTIME-INR  ?APTT  ?URINALYSIS, ROUTINE W REFLEX MICROSCOPIC  ? ? ?EKG ?None ? ?Radiology ?No results found. ? ?Procedures ?Procedures  ? ?CRITICAL CARE ?Performed by: Wandra Arthurs ? ? ?Total critical care time: 30 minutes ? ?Critical care time was exclusive of separately billable procedures and treating other patients. ? ?Critical care was necessary to treat or prevent imminent or life-threatening deterioration. ? ?Critical care was time spent personally by me on the following activities: development of treatment plan with patient and/or surrogate as well as nursing, discussions with consultants, evaluation of patient's response to treatment, examination of patient, obtaining history from patient or surrogate, ordering and performing treatments and interventions, ordering and review of laboratory studies, ordering and review of radiographic studies, pulse oximetry and re-evaluation of patient's condition. ? ? ?Medications Ordered in ED ?Medications  ?lactated ringers infusion (has no administration in time range)  ?lactated ringers bolus 1,000 mL (has no administration in time range)  ?  And  ?lactated ringers bolus 500 mL (has no administration in time range)  ?  And  ?lactated ringers bolus 250 mL (has no  administration in time range)  ?ceFEPIme (MAXIPIME) 2 g in sodium chloride 0.9 % 100 mL IVPB (has no administration in time range)  ?metroNIDAZOLE (FLAGYL) IVPB 500 mg (has no administration in time range)  ?vancomycin (VANCOCIN) IVPB 1000 mg/200 mL premix (has no administration in time range)  ? ? ?ED Course/ Medical Decision Making/ A&P ?  ?                        ?Medical Decision Making ?Johnathan Tona. is a 87 y.o. male here presenting with scrotal and penile swelling.  Patien was just in the hospital and just finished a course of azithromycin and Rocephin.  Patient  was noted to have significant scrotal and penile swelling.  I am concerned for scrotal cellulitis versus Fournier's gangrene.  Patient may have phimosis as well.  We will get CBC and CMP and lactate and cultures and do CT abdomen pelvis to further assess.  We will give broad-spectrum antibiotics. ? ?7:39 PM ?Patient's white blood cell count is 18.  Lactate is 2.4.  CT showed soft tissue swelling and no free air.  Patient does have DVT as well and I ordered heparin.  Patient also has incidental aneurysms.  I discussed case with Dr. Cain Sieve from urology.  He recommend elevate the groin as he thinks is likely soft tissue swelling.  He will see patient as a consult.  I messaged Dr. Unk Lightning regarding the aneurysms. Hospitalist to admit for scrotal edema, DVT, aneurysms, sepsis  ? ?7:53 PM ?Dr. Virl Cagey called back. He states that the aneurysms can be followed up outpatient with him in the office and are incidental findings  ? ?Problems Addressed: ?Acute deep vein thrombosis (DVT) of left femoral vein ([place]): acute illness or injury ?Iliac aneurysm [facility]): acute illness or injury ?Scrotal infection: acute illness or injury ?Sepsis, due to unspecified organism, unspecified whether acute organ dysfunction present [facility]): acute illness or injury ? ?Amount and/or Complexity of Data Reviewed ?Labs: ordered. Decision-making details documented in ED  Course. ?Radiology: ordered and independent interpretation performed. Decision-making details documented in ED Course. ?ECG/medicine tests: ordered. ? ?Risk ?Prescription drug management. ?Decision regarding hospitalization.

## 2021-05-25 NOTE — ED Notes (Signed)
Update given to pt's daughter, Marijean Niemann, and she asked that staff be aware that her father needs help with feeding himself meals. This information given to RN receiving pt in the SD tonight.  ?

## 2021-05-25 NOTE — TOC Transition Note (Signed)
Transition of Care (TOC) - CM/SW Discharge Note ? ? ?Patient Details  ?Name: Johnathan Mitchell. ?MRN: [id number] ?Date of Birth: [birth year] ? ?Transition of Care (TOC) CM/SW Contact:  ?Wil Slape, Meriam Sprague, RN ?Phone Number: ?05/25/2021, 12:42 PM ? ? ?Clinical Narrative:    ?Pt to dc back to [facility] today room 905B. He will be transport via [facility]. Daughter Asher Muir alerted of dc. RN to call report to [phone]. ? ? ?Final next level of care: Skilled Nursing Facility ?Barriers to Discharge: Continued Medical Work up ? ?Discharge Plan and Services ?  ?Discharge Planning Services: CM Consult ?           ?  ?  ? ?Readmission Risk Interventions ? ?  05/17/2021  ?  2:11 PM  ?Readmission Risk Prevention Plan  ?Transportation Screening Complete  ?PCP or Specialist Appt within 3-5 Days Complete  ?HRI or Home Care Consult Complete  ?Social Work Consult for Recovery Care Planning/Counseling Complete  ?Palliative Care Screening Not Applicable  ?Medication Review Oceanographer) Complete  ? ? ? ? ? ?

## 2021-05-25 NOTE — Assessment & Plan Note (Signed)
Appears to be on chronic prednisone 20 mg daily, will continue for now. ?

## 2021-05-25 NOTE — Hospital Course (Signed)
Johnathan Mitchell. is a 87 y.o. male with medical history significant for vascular dementia, history of CVA with left-sided deficits, seizure disorder, recurrent aspiration pneumonia, HTN, anemia of chronic disease, anxiety who is admitted with extensive acute DVT of the left lower extremity and penile/scrotal edema with possible superimposed cellulitis. ?

## 2021-05-25 NOTE — ED Notes (Signed)
Pt difficult IV stick and Charge RN to assess and attempt placement. Will start heparin gtt once second PIV established ?

## 2021-05-25 NOTE — Plan of Care (Signed)
  Problem: Education: Goal: Knowledge of General Education information will improve Description Including pain rating scale, medication(s)/side effects and non-pharmacologic comfort measures Outcome: Progressing   

## 2021-05-25 NOTE — Progress Notes (Addendum)
ANTICOAGULATION CONSULT NOTE - Initial Consult ? ?Pharmacy Consult for IV heparin ?Indication:  VTE treatment ? ?Allergies  ?Allergen Reactions  ? Penicillins Hives and Swelling  ? Valproate Sodium   ?  Other reaction(s): Other (See Comments)  ? Lexapro [Escitalopram] Diarrhea  ? ? ?Patient Measurements: ?Height: 5\' 6"  (167.6 cm) ?Weight: 55 kg (121 lb 4.1 oz) ?IBW/kg (Calculated) : 63.8 ?Heparin Dosing Weight: 55 kg ? ?Vital Signs: ?Temp: 98 ?F (36.7 ?C) (04/28 1716) ?Temp Source: Oral (04/28 1716) ?BP: 157/95 (04/28 1900) ?Pulse Rate: 121 (04/28 1900) ? ?Labs: ?Recent Labs  ?  05/23/21 ?1055 05/24/21 ?0704 05/25/21 ?0532 05/25/21 ?1719  ?HGB 10.0*  --   --  10.5*  ?HCT 31.7*  --   --  32.0*  ?PLT 212  --   --  254  ?APTT  --   --   --  32  ?LABPROT  --   --   --  13.1  ?INR  --   --   --  1.0  ?CREATININE 0.45* 0.40* 0.51* 0.57*  ? ? ?Estimated Creatinine Clearance: 53.5 mL/min (A) (by C-G formula based on SCr of 0.57 mg/dL (L)). ? ? ?Medical History: ?Past Medical History:  ?Diagnosis Date  ? Asthma   ? Dementia ([place])   ? Gout   ? Hypertension   ? Seizures ([place])   ? Stroke [facility])   ? ? ?Medications:  ?No PTA anticoagulation ? ?Assessment: ?Pharmacy consulted to dose IV heparin for VTE treatment for this 87 y.o. male with history of stroke, hypertension, recent HCAP, seizure, and dementia who returned here this afternoon after being discharged today and presenting with scrotal and penile swelling. ? ?Imaging: ?4/28 CT abdomen pelvis: Extensive deep venous thrombosis involving the LEFT external iliac ?vein, LEFT common femoral vein, and LEFT femoral/profundus femoral veins to the inferior extent of the exam. ? ?Goal of Therapy:  ?Heparin level 0.3-0.7 units/ml ?Monitor platelets by anticoagulation protocol: Yes ?  ?Plan:  ?Give 3000 units heparin IV bolus x 1 ?Start heparin infusion at 900 units/hr ?Check heparin level in 8 hours and daily while on heparin ?Continue to monitor H&H and platelets ? ? ? ?Royetta Asal, PharmD, BCPS ?Clinical Pharmacist ?[place] ?Please utilize Amion for appropriate phone number to reach the unit pharmacist ([place]) ?05/25/2021 7:27 PM ? ? ? ?

## 2021-05-25 NOTE — ED Notes (Signed)
Admitting MD alerted of elevated Lactic Acid result of 3.6. MD to round soon and no orders received at this time. ?

## 2021-05-25 NOTE — Progress Notes (Signed)
Pharmacy Antibiotic Note ? ?Johnathan Mitchell. is a 87 y.o. male admitted on 05/25/2021 with cellulitis.  Pharmacy has been consulted for Vancomycin dosing. ? ?Patient received Vancomycin 1500mg  IV, Cefepime 2gm IV, and Metronidazole 500mg  IV x 1 dose each in the ED. ? ?Plan: ?Vancomycin 1000 mg IV Q 24 hrs. Goal AUC 400-550.  Expected AUC: 517.7  SCr used: 0.8 (rounded up from 0.57) ?Ceftriaxone per MD ?F/U culture results and sensitivities ? ? ?Height: 5\' 6"  (167.6 cm) ?Weight: 55 kg (121 lb 4.1 oz) ?IBW/kg (Calculated) : 63.8 ? ?Temp (24hrs), Avg:98 ?F (36.7 ?C), Min:97.6 ?F (36.4 ?C), Max:98.4 ?F (36.9 ?C) ? ?Recent Labs  ?Lab 05/20/21 ? 05/21/21 ?0453 05/22/21 ?4034 05/23/21 ?1055 05/24/21 ?0704 05/25/21 ?0532 05/25/21 ?1719 05/25/21 ?1940  ?WBC 11.0* 8.9 9.5 11.8*  --   --  16.8*  --   ?CREATININE 0.57* 0.48* 0.56* 0.45* 0.40* 0.51* 0.57*  --   ?LATICACIDVEN  --   --   --   --   --   --  2.4* 3.6*  ?  ?Estimated Creatinine Clearance: 53.5 mL/min (A) (by C-G formula based on SCr of 0.57 mg/dL (L)).   ? ?Allergies  ?Allergen Reactions  ? Penicillins Hives and Swelling  ? Lactose Intolerance (Gi) Other (See Comments)  ?  unknown  ? Valproate Sodium   ?  Other reaction(s): Other (See Comments)  ? Lexapro [Escitalopram] Diarrhea  ? ? ?Antimicrobials this admission: ?4/28 Cefepime x 1 ?4/28 Metronidazole x 1  ?4/28 Vancomycin >> ?4/29 Ceftriaxone >> ? ?Dose adjustments this admission: ?  ? ?Microbiology results: ?4/28 BCx:   ?  ? ?Thank you for allowing pharmacy to be a part of this patient?s care. ? ?5/28, PharmD ?05/25/2021 11:08 PM ? ?

## 2021-05-25 NOTE — Assessment & Plan Note (Addendum)
Acute DVT left external iliac, common femoral, and femoral/profundus femoral veins ?Aneurysmal dilatation right common and internal iliac arteries ?Aneurysmal dilatation left internal iliac artery ?Findings seen on CT abdomen/pelvis.  DVT likely secondary to bedbound status.  He has a history of right common iliac artery dissection which was followed by vascular surgery on an outpatient basis.  EDP discussed with on-call vascular surgery who recommended continue with anticoagulation and follow-up outpatient for aneurysms. ?-Continue IV heparin ?-Follow-up with vascular surgery as an outpatient ?

## 2021-05-25 NOTE — Assessment & Plan Note (Signed)
Residual left-sided hemiparesis, bedbound status per family. Does not appear to be on aspirin at this time.  Continue atorvastatin. ?

## 2021-05-25 NOTE — ED Triage Notes (Signed)
BIB EMS from [facility], per EMS reports scrotum and penis swelling, very sensitive to the touch. Patient reports discomfort in the area.  ?

## 2021-05-25 NOTE — ED Notes (Signed)
Attempted [place] IV placement. Unsuccessful. Patient unable to straighten arm and nothing visualized on posterior forearm.  ?

## 2021-05-25 NOTE — Assessment & Plan Note (Signed)
No evidence of scrotal gas or abscess on CT imaging.  Urology following and recommend elevation.  Given leukocytosis and tachycardia we will continue empiric antibiotics for possible superimposed cellulitis although these changes may be related to his extensive DVT. ?-Continue empiric vancomycin and ceftriaxone for now ?-Follow blood cultures ?

## 2021-05-25 NOTE — Consult Note (Signed)
Urology Consult  ? ?History of Present Illness: Johnathan Mitchell. is a 87 y.o. with scrotal swelling ? ?[name] was discharged yesterday after a hospitalizaiton for pneumonia. Upon return to his nursing facility, he was noted to have marked penile and scrotal swelling and returned to the ED. ? ?A CT scan was done in the ED. There is no evidence of abscess or infection, and findings were consistent with edema, which is also present in his abdomen.  ? ?Past Medical History:  ?Diagnosis Date  ? Asthma   ? Dementia (Johnathan Mitchell)   ? Gout   ? Hypertension   ? Seizures (Johnathan Mitchell)   ? Stroke Johnathan Mitchell)   ? ? ?Past Surgical History:  ?Procedure Laterality Date  ? BACK SURGERY    ? FEMUR IM NAIL Left 05/13/2017  ? Procedure: INTRAMEDULLARY (IM) NAIL FEMORAL;  Surgeon: Altamese Doon, MD;  Location: [place];  Service: Orthopedics;  Laterality: Left;  ? KNEE SURGERY    ? ? ?Current Hospital Medications: ? ?Home Meds:  ?No current facility-administered medications on file prior to encounter.  ? ?Current Outpatient Medications on File Prior to Encounter  ?Medication Sig Dispense Refill  ? acetaminophen (TYLENOL) 500 MG tablet Take 1 tablet (500 mg total) by mouth every 8 (eight) hours. (Patient taking differently: Take 1,000 mg by mouth 3 (three) times daily.)    ? atorvastatin (LIPITOR) 80 MG tablet Take 1 tablet (80 mg total) by mouth daily at 6 PM. (Patient taking differently: Take 80 mg by mouth at bedtime.) 30 tablet 0  ? Baclofen 5 MG TABS Take 7.5 mg by mouth 3 (three) times daily. 30 tablet 0  ? busPIRone (BUSPAR) 5 MG tablet Take 2.5 mg by mouth 2 (two) times daily.    ? Cholecalciferol 25 MCG (1000 UT) capsule Take 1,000 Units by mouth daily.    ? diclofenac Sodium (VOLTAREN) 1 % GEL Apply 2 g topically 4 (four) times daily as needed for pain.    ? gabapentin (NEURONTIN) 100 MG capsule Take 100 mg by mouth 2 (two) times daily.    ? levETIRAcetam (KEPPRA) 500 MG tablet Take 1 tablet (500 mg total) by mouth 2 (two) times daily. 60 tablet 0   ? LORazepam (ATIVAN) 0.5 MG tablet Take 0.5 mg by mouth 2 (two) times daily.    ? Multiple Vitamin (MULTIVITAMIN WITH MINERALS) TABS tablet Take 1 tablet by mouth daily.    ? silver sulfADIAZINE (SILVADENE) 1 % cream Apply 1 application. topically 2 (two) times daily. Apply to buttock    ? vitamin B-12 (CYANOCOBALAMIN) 1000 MCG tablet Take 1,000 mcg by mouth daily.    ? Amino Acids-Protein Hydrolys (FEEDING SUPPLEMENT, PRO-STAT SUGAR FREE 64,) LIQD Take 30 mLs by mouth 2 (two) times daily.    ? omeprazole (PRILOSEC) 10 MG capsule Take 10 mg by mouth daily.    ? potassium chloride (KLOR-CON M) 10 MEQ tablet Take 10 mEq by mouth daily.    ? predniSONE (DELTASONE) 10 MG tablet Take 20 mg by mouth daily with breakfast.    ? ? ? ?Scheduled Meds: ? atorvastatin  80 mg Oral QHS  ? baclofen  5 mg Oral TID  ? busPIRone  2.5 mg Oral BID  ? Chlorhexidine Gluconate Cloth  6 each Topical Daily  ? gabapentin  100 mg Oral BID  ? levETIRAcetam  500 mg Oral BID  ? LORazepam  0.5 mg Oral BID  ? pantoprazole  40 mg Oral Daily  ? predniSONE  20 mg Oral Q breakfast  ? sodium chloride flush  3 mL Intravenous Q12H  ? ?Continuous Infusions: ? cefTRIAXone (ROCEPHIN)  IV Stopped (05/26/21 0536)  ? heparin    ? vancomycin    ? ?PRN Meds:. ? ?Allergies:  ?Allergies  ?Allergen Reactions  ? Penicillins Hives and Swelling  ? Lactose Intolerance (Gi) Other (See Comments)  ?  unknown  ? Valproate Sodium   ?  Other reaction(s): Other (See Comments)  ? Lexapro [Escitalopram] Diarrhea  ? ? ?Family History  ?Problem Relation Age of Onset  ? Hyperlipidemia Mother   ? Hypertension Mother   ? ? ?Social History:  reports that he has quit smoking. He has never used smokeless tobacco. He reports that he does not drink alcohol and does not use drugs. ? ?ROS: ?A complete review of systems was performed.  All systems are negative except for pertinent findings as noted. ? ?Physical Exam:  ?Vital signs in last 24 hours: ?Temp:  [97.5 ?F (36.4 ?C)-99.7 ?F (37.6  ?C)] 98.1 ?F (36.7 ?C) (04/29 [id number]) ?Pulse Rate:  [71-121] 71 (04/29 0500) ?Resp:  [0-36] 0 (04/29 0500) ?BP: (118-189)/(77-110) 118/77 (04/29 0500) ?SpO2:  [91 %-99 %] 99 % (04/29 0500) ?Weight:  [55 kg-67.8 kg] 67.8 kg (04/29 0224) ?Constitutional:  Alert and oriented, No acute distress ?Cardiovascular: Regular rate and rhythm ?Respiratory: Normal respiratory effort, Lungs clear bilaterally ?GI: Abdomen is soft, nontender, nondistended, no abdominal masses ?GU: No CVA tenderness, penis and scrotum are edematous ?Neurologic: Grossly intact, no focal deficits ?Psychiatric: Normal mood and affect ?There is pitting lower extremity edema ? ?Laboratory Data:  ?Recent Labs  ?  05/23/21 ?1055 05/25/21 ?1719 05/26/21 ?0736  ?WBC 11.8* 16.8* 15.6*  ?HGB 10.0* 10.5* 9.2*  ?HCT 31.7* 32.0* 29.2*  ?PLT 212 254 242  ? ? ?Recent Labs  ?  05/23/21 ?1055 05/24/21 ?0704 05/25/21 ?0532 05/25/21 ?1719  ?NA 146* 144 145 146*  ?K 2.6* 2.8* 3.3* 3.6  ?CL 112* 113* 113* 114*  ?GLUCOSE 158* 100* 111* 130*  ?BUN 5* <5* 7* 9  ?CALCIUM 8.2* 7.6* 8.0* 8.5*  ?CREATININE 0.45* 0.40* 0.51* 0.57*  ? ? ? ?Results for orders placed or performed during the hospital encounter of 05/25/21 (from the past 24 hour(s))  ?Lactic acid, plasma     Status: Abnormal  ? Collection Time: 05/25/21  5:19 PM  ?Result Value Ref Range  ? Lactic Acid, Venous 2.4 (HH) 0.5 - 1.9 mmol/L  ?Comprehensive metabolic panel     Status: Abnormal  ? Collection Time: 05/25/21  5:19 PM  ?Result Value Ref Range  ? Sodium 146 (H) 135 - 145 mmol/L  ? Potassium 3.6 3.5 - 5.1 mmol/L  ? Chloride 114 (H) 98 - 111 mmol/L  ? CO2 27 22 - 32 mmol/L  ? Glucose, Bld 130 (H) 70 - 99 mg/dL  ? BUN 9 8 - 23 mg/dL  ? Creatinine, Ser 0.57 (L) 0.61 - 1.24 mg/dL  ? Calcium 8.5 (L) 8.9 - 10.3 mg/dL  ? Total Protein 5.9 (L) 6.5 - 8.1 g/dL  ? Albumin 2.2 (L) 3.5 - 5.0 g/dL  ? AST 21 15 - 41 U/L  ? ALT 13 0 - 44 U/L  ? Alkaline Phosphatase 60 38 - 126 U/L  ? Total Bilirubin 0.5 0.3 - 1.2 mg/dL  ? GFR,  Estimated >60 >60 mL/min  ? Anion gap 5 5 - 15  ?CBC with Differential     Status: Abnormal  ? Collection Time: 05/25/21  5:19 PM  ?Result Value Ref Range  ? WBC 16.8 (H) 4.0 - 10.5 K/uL  ? RBC 3.73 (L) 4.22 - 5.81 MIL/uL  ? Hemoglobin 10.5 (L) 13.0 - 17.0 g/dL  ? HCT 32.0 (L) 39.0 - 52.0 %  ? MCV 85.8 80.0 - 100.0 fL  ? MCH 28.2 26.0 - 34.0 pg  ? MCHC 32.8 30.0 - 36.0 g/dL  ? RDW 18.2 (H) 11.5 - 15.5 %  ? Platelets 254 150 - 400 K/uL  ? nRBC 0.0 0.0 - 0.2 %  ? Neutrophils Relative % 86 %  ? Neutro Abs 14.4 (H) 1.7 - 7.7 K/uL  ? Lymphocytes Relative 8 %  ? Lymphs Abs 1.3 0.7 - 4.0 K/uL  ? Monocytes Relative 5 %  ? Monocytes Absolute 0.8 0.1 - 1.0 K/uL  ? Eosinophils Relative 0 %  ? Eosinophils Absolute 0.0 0.0 - 0.5 K/uL  ? Basophils Relative 0 %  ? Basophils Absolute 0.0 0.0 - 0.1 K/uL  ? Immature Granulocytes 1 %  ? Abs Immature Granulocytes 0.20 (H) 0.00 - 0.07 K/uL  ?Protime-INR     Status: None  ? Collection Time: 05/25/21  5:19 PM  ?Result Value Ref Range  ? Prothrombin Time 13.1 11.4 - 15.2 seconds  ? INR 1.0 0.8 - 1.2  ?APTT     Status: None  ? Collection Time: 05/25/21  5:19 PM  ?Result Value Ref Range  ? aPTT 32 24 - 36 seconds  ?Blood Culture (routine x 2)     Status: None (Preliminary result)  ? Collection Time: 05/25/21  5:19 PM  ? Specimen: BLOOD  ?Result Value Ref Range  ? Specimen Description    ?  BLOOD BLOOD LEFT FOREARM ?Performed at [facility], [place] [address], [place], [place] [address] ?  ? Special Requests    ?  BOTTLES DRAWN AEROBIC AND ANAEROBIC Blood Culture adequate volume ?Performed at [facility], [place] [address], [place], [place] [address] ?  ? Culture    ?  NO GROWTH < 12 HOURS ?Performed at [facility], [place] [address] [place], [place] [address] ?  ? Report Status PENDING   ?Resp Panel by RT-PCR (Flu A&B, Covid) Nasopharyngeal Swab     Status: None  ? Collection Time: 05/25/21  5:59 PM  ? Specimen: Nasopharyngeal Swab; Nasopharyngeal(NP)  swabs in vial transport medium  ?Result Value Ref Range  ? SARS Coronavirus 2 by RT PCR NEGATIVE NEGATIVE  ? Influenza A by PCR NEGATIVE NEGATIVE  ? Influenza B by PCR NEGATIVE NEGATIVE  ?Lactic acid, plas

## 2021-05-26 ENCOUNTER — Encounter (HOSPITAL_COMMUNITY): Payer: Self-pay | Admitting: Internal Medicine

## 2021-05-26 DIAGNOSIS — I82422 Acute embolism and thrombosis of left iliac vein: Secondary | ICD-10-CM | POA: Diagnosis not present

## 2021-05-26 LAB — CBC
HCT: 29.2 % — ABNORMAL LOW (ref 39.0–52.0)
Hemoglobin: 9.2 g/dL — ABNORMAL LOW (ref 13.0–17.0)
MCH: 27.5 pg (ref 26.0–34.0)
MCHC: 31.5 g/dL (ref 30.0–36.0)
MCV: 87.2 fL (ref 80.0–100.0)
Platelets: 242 10*3/uL (ref 150–400)
RBC: 3.35 MIL/uL — ABNORMAL LOW (ref 4.22–5.81)
RDW: 18.3 % — ABNORMAL HIGH (ref 11.5–15.5)
WBC: 15.6 10*3/uL — ABNORMAL HIGH (ref 4.0–10.5)
nRBC: 0 % (ref 0.0–0.2)

## 2021-05-26 LAB — LACTIC ACID, PLASMA: Lactic Acid, Venous: 1.8 mmol/L (ref 0.5–1.9)

## 2021-05-26 LAB — URINALYSIS, ROUTINE W REFLEX MICROSCOPIC
Bilirubin Urine: NEGATIVE
Glucose, UA: NEGATIVE mg/dL
Hgb urine dipstick: NEGATIVE
Ketones, ur: NEGATIVE mg/dL
Leukocytes,Ua: NEGATIVE
Nitrite: NEGATIVE
Protein, ur: NEGATIVE mg/dL
Specific Gravity, Urine: 1.006 (ref 1.005–1.030)
pH: 7 (ref 5.0–8.0)

## 2021-05-26 LAB — MRSA NEXT GEN BY PCR, NASAL: MRSA by PCR Next Gen: NOT DETECTED

## 2021-05-26 LAB — BASIC METABOLIC PANEL
Anion gap: 3 — ABNORMAL LOW (ref 5–15)
BUN: 7 mg/dL — ABNORMAL LOW (ref 8–23)
CO2: 30 mmol/L (ref 22–32)
Calcium: 7.8 mg/dL — ABNORMAL LOW (ref 8.9–10.3)
Chloride: 107 mmol/L (ref 98–111)
Creatinine, Ser: 0.54 mg/dL — ABNORMAL LOW (ref 0.61–1.24)
GFR, Estimated: >60 mL/min (ref >60)
Glucose, Bld: 91 mg/dL (ref 70–99)
Potassium: 3.4 mmol/L — ABNORMAL LOW (ref 3.5–5.1)
Sodium: 140 mmol/L (ref 135–145)

## 2021-05-26 LAB — C DIFFICILE QUICK SCREEN W PCR REFLEX
C Diff antigen: NEGATIVE
C Diff interpretation: NOT DETECTED
C Diff toxin: NEGATIVE

## 2021-05-26 LAB — HEPARIN LEVEL (UNFRACTIONATED)
Heparin Unfractionated: 0.78 IU/mL — ABNORMAL HIGH (ref 0.30–0.70)
Heparin Unfractionated: 0.45 IU/mL (ref 0.30–0.70)

## 2021-05-26 LAB — PROCALCITONIN: Procalcitonin: 0.11 ng/mL

## 2021-05-26 MED ORDER — FUROSEMIDE 10 MG/ML IJ SOLN
40.0000 mg | Freq: Once | INTRAMUSCULAR | Status: AC
  Administered 2021-05-26: 40 mg via INTRAVENOUS
  Filled 2021-05-26: qty 4

## 2021-05-26 MED ORDER — HEPARIN (PORCINE) 25000 UT/250ML-% IV SOLN
700.0000 [IU]/h | INTRAVENOUS | Status: AC
  Administered 2021-05-27 – 2021-05-28 (×2): 700 [IU]/h via INTRAVENOUS
  Filled 2021-05-26 (×2): qty 250

## 2021-05-26 MED ORDER — LEVETIRACETAM IN NACL 500 MG/100ML IV SOLN
500.0000 mg | Freq: Two times a day (BID) | INTRAVENOUS | Status: DC
  Administered 2021-05-26 – 2021-05-29 (×7): 500 mg via INTRAVENOUS
  Filled 2021-05-26 (×9): qty 100

## 2021-05-26 MED ORDER — SODIUM CHLORIDE 0.9 % IV SOLN: INTRAVENOUS | Status: DC | PRN

## 2021-05-26 MED ORDER — PREDNISONE 10 MG PO TABS
10.0000 mg | ORAL_TABLET | Freq: Every day | ORAL | Status: DC
  Administered 2021-05-27: 10 mg via ORAL
  Filled 2021-05-26: qty 1

## 2021-05-26 MED ORDER — PREDNISONE 10 MG PO TABS: 10.0000 mg | ORAL_TABLET | Freq: Every day | ORAL | Status: DC

## 2021-05-26 MED ORDER — CHLORHEXIDINE GLUCONATE CLOTH 2 % EX PADS
6.0000 | MEDICATED_PAD | Freq: Every day | CUTANEOUS | Status: DC
  Administered 2021-05-26 – 2021-05-28 (×3): 6 via TOPICAL

## 2021-05-26 NOTE — TOC Initial Note (Signed)
Transition of Care (TOC) - Initial/Assessment Note  ? ? ?Patient Details  ?Name: Johnathan Mitchell. ?MRN: [id number] ?Date of Birth: 24-Jan-1935 ? ?Transition of Care (TOC) CM/SW Contact:    ?Cecille Po, RN ?Phone Number: ?05/26/2021, 3:42 PM ? ?Clinical Narrative:                 ? ?Patient was discharged to [facility] on 4/28 and was immediately brought back to the hospital due to a new issue of scrotal swelling/pain.   ?At last discharge he was referred to [facility] for palliative care. Has a history of aspiration pneumonia, dementia, and residual deficits from a prior CVA.  ?TOC following.  ? ? ?Expected Discharge Plan: Skilled Nursing Facility ?Barriers to Discharge: Continued Medical Work up ? ? ?Expected Discharge Plan and Services ?Expected Discharge Plan: Skilled Nursing Facility ?  ?   ?Living arrangements for the past 2 months: Skilled Nursing Facility ?                ?  ? ?Prior Living Arrangements/Services ?Living arrangements for the past 2 months: Skilled Nursing Facility ?Lives with:: Facility Resident ?  ?       ?Need for Family Participation in Patient Care: Yes (Comment) ?Care giver support system in place?: Yes (comment) ?  ?Criminal Activity/Legal Involvement Pertinent to Current Situation/Hospitalization: No - Comment as needed ? ?Activities of Daily Living ?Home Assistive Devices/Equipment: Wheelchair ?ADL Screening (condition at time of admission) ?Patient's cognitive ability adequate to safely complete daily activities?: No ?Is the patient deaf or have difficulty hearing?: No ?Does the patient have difficulty seeing, even when wearing glasses/contacts?: Yes ?Does the patient have difficulty concentrating, remembering, or making decisions?: Yes ?Patient able to express need for assistance with ADLs?: No ?Does the patient have difficulty dressing or bathing?: Yes ?Independently performs ADLs?: No ?Communication: Needs assistance ?Is this a change from baseline?: Pre-admission  baseline ?Dressing (OT): Needs assistance ?Is this a change from baseline?: Pre-admission baseline ?Grooming: Needs assistance ?Is this a change from baseline?: Pre-admission baseline ?Feeding: Needs assistance ?Is this a change from baseline?: Pre-admission baseline ?Bathing: Needs assistance ?Is this a change from baseline?: Pre-admission baseline ?Toileting: Needs assistance ?Is this a change from baseline?: Pre-admission baseline ?In/Out Bed: Needs assistance ?Is this a change from baseline?: Pre-admission baseline ?Walks in Home: Dependent ?Is this a change from baseline?: Pre-admission baseline ?Does the patient have difficulty walking or climbing stairs?: Yes ?Weakness of Legs: Both ?Weakness of Arms/Hands: Both ? ?Emotional Assessment ?  ?Alcohol / Substance Use: Not Applicable ?Psych Involvement: No (comment) ? 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 Diagnosis Date Noted  ? Acute deep vein thrombosis (DVT) of iliac vein of left lower extremity (HCC) 05/25/2021  ? 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 Scrotal edema 05/25/2021  ? History of cerebrovascular accident (CVA) with residual deficit 05/25/2021  ? Anxiety 05/25/2021  ? Osteoarthritis 05/25/2021  ? Hypomagnesemia 05/22/2021  ? Goals of care, counseling/discussion   ? Palliative care by specialist   ? Aspiration pneumonia (HCC) 05/16/2021  ? Pressure injury of skin  03/13/2021  ? Hypophosphatemia 03/13/2021  ? HCAP (healthcare-associated pneumonia) 03/13/2021  ? Hypokalemia 03/12/2021  ?  Dehydration 03/12/2021  ? Prolonged QT interval 03/12/2021  ? Generalized weakness 03/12/2021  ? Seizure disorder (HCC) 03/12/2021  ? Cellulitis of left hand 12/15/2019  ? Wrist pain 12/15/2019  ? Closed left acetabular fracture (HCC) 12/14/2019  ? SIRS (systemic inflammatory response syndrome) (HCC) 12/14/2019  ? Protein-calorie malnutrition (HCC) 05/20/2017  ? TIA (transient ischemic attack) 05/20/2017  ? 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 AKI (acute kidney injury) (HCC) 05/12/2017  ? Stroke (HCC) 05/11/2017  ? Femur fracture (HCC) 05/11/2017  ? Chest pain 02/09/2016  ? HTN (hypertension) 02/09/2016  ? Dementia with behavioral disturbance (HCC) 02/09/2016  ? Chest pain at rest 02/09/2016  ? Cerebral infarction (HCC) 02/01/2014  ? Benign essential HTN 02/01/2014  ? ?PCP:  Administration, Veterans ?Pharmacy:   ?[facility] - [place], [place] - [address] ?[phone] ?[place] [address] ?Phone: [phone] Fax: [phone] ? ? ? ?Readmission Risk Interventions ? ?  05/17/2021  ?  2:11 PM  ?Readmission Risk Prevention Plan  ?Transportation Screening Complete  ?PCP or Specialist Appt within 3-5 Days Complete  ?HRI or Home Care Consult Complete  ?Social Work Consult for Recovery Care Planning/Counseling Complete  ?Palliative Care Screening Not Applicable  ?Medication Review Oceanographer) Complete  ? ? ? ?

## 2021-05-26 NOTE — Evaluation (Signed)
Clinical/Bedside Swallow Evaluation ?Patient Details  ?Name: Johnathan Mitchell. ?MRN: [id number] ?Date of Birth: 06/26/1934 ? ?Today's Date: 05/26/2021 ?Time: SLP Start Time (ACUTE ONLY): [id number] SLP Stop Time (ACUTE ONLY): 1430 ?SLP Time Calculation (min) (ACUTE ONLY): 10 min ? ?Past Medical History:  ?Past Medical History:  ?Diagnosis Date  ? Asthma   ? Dementia (Johnathan Mitchell)   ? Gout   ? Hypertension   ? Seizures ([place])   ? Stroke Johnathan Mitchell)   ? ?Past Surgical History:  ?Past Surgical History:  ?Procedure Laterality Date  ? BACK SURGERY    ? FEMUR IM NAIL Left 05/13/2017  ? Procedure: INTRAMEDULLARY (IM) NAIL FEMORAL;  Surgeon: Altamese [place], MD;  Location: [place];  Service: Orthopedics;  Laterality: Left;  ? KNEE SURGERY    ? ?HPI:  ?Shalik Creson. is a 87 y.o. male with medical history significant for vascular dementia, history of CVA with left-sided deficits, seizure disorder, recurrent aspiration pneumonia, HTN, anemia of chronic disease, anxiety who presented to the ED from SNF for evaluation of scrotal swelling. Pt had two MBS 4/21 and 4/25 with the last recommending Dys 1/nectar thick liquids.  ?  ?Assessment / Plan / Recommendation  ?Clinical Impression ? Pt demonstrates adequate tolerance of diet recommended on MBS on 4/24 - puree and nectar thick liquids. Pt is alert but weak. Able to drink from a cup and follow one step commands. No concern for acute change in swallow function. Will resume previously recommended diet and sign off. F/u with SLP at next level of care for therapeutic inteventions if pts general strength improves. ?SLP Visit Diagnosis: Dysphagia, oropharyngeal phase (R13.12) ?   ?Aspiration Risk ? Mild aspiration risk  ?  ?Diet Recommendation Dysphagia 1 (Puree);Nectar-thick liquid  ? ?Liquid Administration via: Cup;Straw ?Medication Administration: Crushed with puree ?Supervision: Staff to assist with self feeding;Full supervision/cueing for compensatory strategies ?Compensations: Minimize environmental  distractions;Slow rate;Small sips/bites ?Postural Changes: Seated upright at 90 degrees  ?  ?Other  Recommendations     ? ?Recommendations for follow up therapy are one component of a multi-disciplinary discharge planning process, led by the attending physician.  Recommendations may be updated based on patient status, additional functional criteria and insurance authorization. ? ?Follow up Recommendations Skilled nursing-short term rehab (<3 hours/day)  ? ? ?  ?Assistance Recommended at Discharge    ?Functional Status Assessment Patient has had a recent decline in their functional status and/or demonstrates limited ability to make significant improvements in function in a reasonable and predictable amount of time  ?Frequency and Duration    ?  ?  ?   ? ?Prognosis    ? ?  ? ?Swallow Study   ?General HPI: Johnathan Mitchell. is a 87 y.o. male with medical history significant for vascular dementia, history of CVA with left-sided deficits, seizure disorder, recurrent aspiration pneumonia, HTN, anemia of chronic disease, anxiety who presented to the ED from SNF for evaluation of scrotal swelling. Pt had two MBS 4/21 and 4/25 with the last recommending Dys 1/nectar thick liquids. ?Type of Study: Bedside Swallow Evaluation ?Previous Swallow Assessment: see hx ?Diet Prior to this Study: NPO ?Temperature Spikes Noted: No ?History of Recent Intubation: No ?Behavior/Cognition: Alert;Cooperative ?Oral Cavity Assessment: Within Functional Limits ?Oral Care Completed by SLP: No ?Oral Cavity - Dentition: Poor condition ?Self-Feeding Abilities: Total assist ?Patient Positioning: Partially reclined ?Baseline Vocal Quality: Low vocal intensity ?Volitional Cough: Weak  ?  ?Oral/Motor/Sensory Function Overall Oral Motor/Sensory Function: Generalized oral weakness   ?  Ice Chips Ice chips: Not tested   ?Thin Liquid Thin Liquid: Not tested  ?  ?Nectar Thick Nectar Thick Liquid: Within functional limits ?Presentation: Cup   ?Honey Thick Honey  Thick Liquid: Not tested   ?Puree Puree: Within functional limits ?Presentation: Spoon   ?Solid ? ? ?  Solid: Not tested  ? ?  ? ?Shermeka Rutt, Katherene Ponto ?05/26/2021,2:57 PM ? ? ? ?

## 2021-05-26 NOTE — Progress Notes (Signed)
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  These have been present for years.  No significant size change in the last 2 years. ?Patient is asymptomatic. ?These do not fit size criteria for elective repair, and can be followed in the outpatient setting. I have scheduled outpatient follow-up with my office. ? ?Victorino Sparrow MD ?

## 2021-05-26 NOTE — Progress Notes (Signed)
ANTICOAGULATION CONSULT NOTE - Initial Consult ? ?Pharmacy Consult for IV heparin ?Indication:  VTE treatment ? ?Allergies  ?Allergen Reactions  ? Penicillins Hives and Swelling  ? Lactose Intolerance (Gi) Other (See Comments)  ?  unknown  ? Valproate Sodium   ?  Other reaction(s): Other (See Comments)  ? Lexapro [Escitalopram] Diarrhea  ? ? ?Patient Measurements: ?Height: 5\' 6"  (167.6 cm) ?Weight: 67.8 kg (149 lb 7.6 oz) ?IBW/kg (Calculated) : 63.8 ?Heparin Dosing Weight: 55 kg ? ?Vital Signs: ?Temp: 98.1 ?F (36.7 ?C) (04/29 01-21-1990) ?Temp Source: Oral (04/29 0849) ?BP: 118/77 (04/29 0500) ?Pulse Rate: 71 (04/29 0500) ? ?Labs: ?Recent Labs  ?  05/23/21 ?1055 05/24/21 ?0704 05/25/21 ?0532 05/25/21 ?1719 05/26/21 ?0736  ?HGB 10.0*  --   --  10.5* 9.2*  ?HCT 31.7*  --   --  32.0* 29.2*  ?PLT 212  --   --  254 242  ?APTT  --   --   --  32  --   ?LABPROT  --   --   --  13.1  --   ?INR  --   --   --  1.0  --   ?HEPARINUNFRC  --   --   --   --  0.78*  ?CREATININE 0.45* 0.40* 0.51* 0.57*  --   ? ? ? ?Estimated Creatinine Clearance: 62 mL/min (A) (by C-G formula based on SCr of 0.57 mg/dL (L)). ? ? ?Medical History: ?Past Medical History:  ?Diagnosis Date  ? Asthma   ? Dementia (HCC)   ? Gout   ? Hypertension   ? Seizures (HCC)   ? Stroke [facility])   ? ? ?Medications:  ?No PTA anticoagulation ? ?Assessment: ?Pharmacy consulted to dose IV heparin for VTE treatment for this 87 y.o. male with history of stroke, hypertension, recent HCAP, seizure, and dementia who returned here this afternoon after being discharged today and presenting with scrotal and penile swelling. ? ?Imaging: ?4/28 CT abdomen pelvis: Extensive deep venous thrombosis involving the LEFT external iliac ?vein, LEFT common femoral vein, and LEFT femoral/profundus femoral veins to the inferior extent of the exam. ? ?Heparin level SUPRAtherapeutic on current IV heparin rate of 900 units/hr ?Hgb 9.2 down from 10.5, Plts stable ?Per RN, no issues or bleeding noted ? ?Goal  of Therapy:  ?Heparin level 0.3-0.7 units/ml ?Monitor platelets by anticoagulation protocol: Yes ?  ?Plan:  ?Reduce IV Heparin from 900 units/hr to 700 units/hr ?Recheck heparin level in 8 hours after heparin rate decrease ?Continue to monitor H&H and platelets ? ? ?5/28, PharmD, BCPS ?Secure Chat if ?s ?05/26/2021 9:07 AM ? ? ? ? ?

## 2021-05-26 NOTE — Progress Notes (Signed)
?PROGRESS NOTE ? ? ? ?Johnathan InchesJames C Sligh Jr.  XLK:[id number] DOB: 27-May-1934 DOA: 05/25/2021 ?[facility]  ? ? ? ?Brief Narrative:  ?Johnathan InchesJames C Gortney Jr. is a 87 y.o. male with medical history significant for vascular dementia, history of CVA with left-sided deficits, seizure disorder, recurrent aspiration pneumonia, HTN, anemia of chronic disease, anxiety who is admitted with extensive acute DVT of the left lower extremity and penile/scrotal edema with possible superimposed cellulitis. ? ? ?Subjective: ? ? ?Demented elderly, frail, no agitation, oriented to self only, denies pain,  ?On 4liter oxygen, does not appear to be in acute distress ?Appear volume overloaded,  ?Will try lasix ?Per RN he refused all meds, will change meds to iv if possible  ?Having liquid stool ? ?Assessment & Plan: ? Principal Problem: ?  Acute deep vein thrombosis (DVT) of iliac vein of left lower extremity (HCC) ?Active Problems: ?  Scrotal edema ?  History of cerebrovascular accident (CVA) with residual deficit ?  HTN (hypertension) ?  Seizure disorder (HCC) ?  Dementia with behavioral disturbance (HCC) ?  Aneurysm of right common iliac artery (HCC) ?  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  Anxiety ?  Osteoarthritis ? ? ? ?Assessment and Plan: ? ?* Acute deep vein thrombosis (DVT) of iliac vein of left lower extremity (HCC) ?Acute DVT left external iliac, common femoral, and femoral/profundus femoral veins ?Aneurysmal dilatation right common and internal iliac arteries ?Aneurysmal dilatation left internal iliac artery ?Findings seen on CT abdomen/pelvis.  DVT likely secondary to bedbound status.  He has a history of right common iliac artery dissection which was followed by vascular surgery on an outpatient basis.  EDP discussed with on-call vascular surgery who recommended continue with anticoagulation and follow-up outpatient for aneurysms. ?-Continue IV  heparin ?-Follow-up with vascular surgery as an outpatient ? ?Volume overload/ anasarca, bilateral pleural effusion,scrotal edema, lower extremity edema ?Seen by urology who does not think infectious etiology, exam consistence with edema per urology ?No evidence of scrotal gas or abscess on CT imaging.   ?Start iv lasix ? ?Leukocytosis ?From stress? ?From steroids? ?From Recurrent aspiration? ?F/u on cultures ?Repeat cbc in am ? ? ?Dysphagia ?Seen by speech recent hospitalization, was discharged on pur?e and nectar thick liquid ?Currently appear weak, with congested weak cough, refused to take oral per RN reports ?Keep n.p.o. for now ,speech reeval ?Continue aspiration precaution ?Continue Rocephin ?DC vanc ? ?History of cerebrovascular accident (CVA) with residual deficit ?Residual left-sided hemiparesis, bedbound status for about two years per family. Does not appear to be on aspirin at this time.  Continue atorvastatin. ? ? ?Seizure disorder (HCC) ?Iv Keppra for now, as unreliable oral intake ? ?Dementia with behavioral disturbance (HCC) ?Chronic vascular dementia at risk for delirium.  Placed on delirium precautions. ? ?Anxiety ?Continue home Ativan 0.5 mg twice daily. ? ?HTN (hypertension) ?Not currently on antihypertensives.  BP mildly elevated but acceptable at this time. ?Started on iv lasix, monitor bp and cr ? ?Osteoarthritis ?Prednisone 20mg  daily appears is on nursing home record ? Per daughter, patient was on prednisone two year ago, not recently,  ? Change to prednisone to 10mg  daily for now ? ? ? ?FTT, reportedly bedbound for about 2 years , poor prognosis, palliative care consult placed. ? ?  ? ?Skin Assessment: ?I have examined the patient?s skin and I agree with the wound assessment as performed by the wound care RN as outlined below: ?Pressure  Injury 05/16/21 Buttocks Right Unstageable - Full thickness tissue loss in which the base of the injury is covered by slough (yellow, tan, gray, green or  brown) and/or eschar (tan, brown or black) in the wound bed. (Active)  ?05/16/21 2100  ?Location: Buttocks  ?Location Orientation: Right  ?Staging: Unstageable - Full thickness tissue loss in which the base of the injury is covered by slough (yellow, tan, gray, green or brown) and/or eschar (tan, brown or black) in the wound bed.  ?Wound Description (Comments):   ?Present on Admission: Yes  ?Dressing Type Foam - Lift dressing to assess site every shift 05/25/21 2100  ? ? ? ?I have Reviewed nursing notes, Vitals, pain scores, I/o's, Lab results and  imaging results since pt's last encounter, details please see discussion above  ?I ordered the following labs:  ?Unresulted Labs (From admission, onward)  ? ?  Start     Ordered  ? 05/26/21 0730  Heparin level (unfractionated)  Once-Timed,   TIMED       ? 05/25/21 2312  ? 05/26/21 0500  CBC  Tomorrow morning,   R       ? 05/25/21 2107  ? 05/26/21 0500  Basic metabolic panel  Tomorrow morning,   R       ? 05/25/21 2107  ? 05/26/21 0500  Procalcitonin - Baseline  Tomorrow morning,   R       ? 05/25/21 2255  ? 05/25/21 1719  Blood Culture (routine x 2)  (Septic presentation on arrival (screening labs, nursing and treatment orders for obvious sepsis))  BLOOD CULTURE X 2,   STAT     ? 05/25/21 1719  ? 05/25/21 1719  Urinalysis, Routine w reflex microscopic  (Septic presentation on arrival (screening labs, nursing and treatment orders for obvious sepsis))  ONCE - URGENT,   URGENT       ? 05/25/21 1719  ? 05/25/21 1719  Urine Culture  (Septic presentation on arrival (screening labs, nursing and treatment orders for obvious sepsis))  ONCE - URGENT,   URGENT       ?Question:  Indication  Answer:  Dysuria  ? 05/25/21 1719  ? ?  ?  ? ?  ? ? ? ?DVT prophylaxis:   On heparin drip ? ? ?Code Status:   Code Status: Full Code ? ?Family Communication: Daughter over the phone ?Disposition:  ? ?Status is: Inpatient ? ?Dispo: The patient is from: snf ?             Anticipated d/c is to: TBD ?              Anticipated d/c date is: TBD ? ?Antimicrobials:   ? ?Anti-infectives (From admission, onward)  ? ? Start     Dose/Rate Route Frequency Ordered Stop  ? 05/26/21 1900  vancomycin (VANCOCIN) IVPB 1000 mg/200 mL premix  Status:  Discontinued       ? 1,000 mg ?200 mL/hr over 60 Minutes Intravenous Every 24 hours 05/25/21 2307 05/26/21 1223  ? 05/26/21 0500  cefTRIAXone (ROCEPHIN) 1 g in sodium chloride 0.9 % 100 mL IVPB       ? 1 g ?200 mL/hr over 30 Minutes Intravenous Every 24 hours 05/25/21 2255    ? 05/25/21 1745  vancomycin (VANCOREADY) IVPB 1500 mg/300 mL       ? 1,500 mg ?150 mL/hr over 120 Minutes Intravenous  Once 05/25/21 1731 05/25/21 2053  ? 05/25/21 1730  ceFEPIme (MAXIPIME) 2 g in sodium chloride  0.9 % 100 mL IVPB       ? 2 g ?200 mL/hr over 30 Minutes Intravenous  Once 05/25/21 1719 05/25/21 1758  ? 05/25/21 1730  metroNIDAZOLE (FLAGYL) IVPB 500 mg       ? 500 mg ?100 mL/hr over 60 Minutes Intravenous  Once 05/25/21 1719 05/25/21 1859  ? 05/25/21 1730  vancomycin (VANCOCIN) IVPB 1000 mg/200 mL premix  Status:  Discontinued       ? 1,000 mg ?200 mL/hr over 60 Minutes Intravenous  Once 05/25/21 1719 05/25/21 1731  ? ?  ? ? ? ? ? ?Objective: ?Vitals:  ? 05/26/21 0224 05/26/21 0300 05/26/21 0400 05/26/21 0500  ?BP:  131/85 129/85 118/77  ?Pulse:  74 79 71  ?Resp:  (!) 0 10 (!) 0  ?Temp:   (!) 97.5 ?F (36.4 ?C)   ?TempSrc:   Oral   ?SpO2:  97% 98% 99%  ?Weight: 67.8 kg     ?Height:      ? ? ?Intake/Output Summary (Last 24 hours) at 05/26/2021 0729 ?Last data filed at 05/26/2021 0543 ?Gross per 24 hour  ?Intake 844.38 ml  ?Output --  ?Net 844.38 ml  ? ?Filed Weights  ? 05/25/21 1717 05/26/21 0224  ?Weight: 55 kg 67.8 kg  ? ? ?Examination: ? ?General exam: alert, awake, calm , demented, only oriented to self ?Respiratory system: diminished at basis, no wheezing, Respiratory effort normal. Occasional weak cough ?Cardiovascular system:  RRR.  ?Gastrointestinal system: Abdomen is nondistended, soft and  nontender.  Normal bowel sounds heard. ?Central nervous system: Alert and oriented to self, left hemiparesis, left arm chronic contracture,  ?Extremities:  bilateral lower extremity edema, left >Right  ?Skin: small sacral

## 2021-05-26 NOTE — Progress Notes (Signed)
ANTICOAGULATION CONSULT NOTE - Follow Up Consult ? ?Pharmacy Consult for Heparin ?Indication: DVT ? ?Allergies  ?Allergen Reactions  ? Penicillins Hives and Swelling  ? Lactose Intolerance (Gi) Other (See Comments)  ?  unknown  ? Valproate Sodium   ?  Other reaction(s): Other (See Comments)  ? Lexapro [Escitalopram] Diarrhea  ? ? ?Patient Measurements: ?Height: 5\' 6"  (167.6 cm) ?Weight: 67.8 kg (149 lb 7.6 oz) ?IBW/kg (Calculated) : 63.8 ?Heparin Dosing Weight:  67.8kg ? ?Vital Signs: ?Temp: 97.6 ?F (36.4 ?C) (04/29 1731) ?Temp Source: Oral (04/29 1731) ?BP: 134/87 (04/29 1900) ?Pulse Rate: 82 (04/29 1900) ? ?Labs: ?Recent Labs  ?  05/25/21 ?0532 05/25/21 ?1719 05/26/21 ?05/28/21 05/26/21 ?1804  ?HGB  --  10.5* 9.2*  --   ?HCT  --  32.0* 29.2*  --   ?PLT  --  254 242  --   ?APTT  --  32  --   --   ?LABPROT  --  13.1  --   --   ?INR  --  1.0  --   --   ?HEPARINUNFRC  --   --  0.78* 0.45  ?CREATININE 0.51* 0.57* 0.54*  --   ? ? ?Estimated Creatinine Clearance: 62 mL/min (A) (by C-G formula based on SCr of 0.54 mg/dL (L)). ? ? ?Assessment: ?AC/Heme: DVT on IV heparin. Hep level 0.78>>0.45 not in goal range. Hgb down to 9.2. Plts WNL ? ?Goal of Therapy:  ?Heparin level 0.3-0.7 units/ml ?Monitor platelets by anticoagulation protocol: Yes ?  ?Plan:  ?Con't IV heparin at 700 units/hr ?Daily HL and CBC ? ? ?Ariah Mower S. 05/28/21, PharmD, BCPS ?Clinical Staff Pharmacist ?[email or web address] ? ?Merilynn Finland, Merilynn Finland ?05/26/2021,7:18 PM ? ? ?

## 2021-05-27 DIAGNOSIS — Z7189 Other specified counseling: Secondary | ICD-10-CM | POA: Diagnosis not present

## 2021-05-27 DIAGNOSIS — R531 Weakness: Secondary | ICD-10-CM

## 2021-05-27 DIAGNOSIS — I82422 Acute embolism and thrombosis of left iliac vein: Secondary | ICD-10-CM

## 2021-05-27 DIAGNOSIS — Z515 Encounter for palliative care: Secondary | ICD-10-CM

## 2021-05-27 LAB — GASTROINTESTINAL PANEL BY PCR, STOOL (REPLACES STOOL CULTURE)

## 2021-05-27 LAB — CBC WITH DIFFERENTIAL/PLATELET
Abs Immature Granulocytes: 0.18 10*3/uL — ABNORMAL HIGH (ref 0.00–0.07)
Basophils Absolute: 0 10*3/uL (ref 0.0–0.1)
Basophils Relative: 0 %
Eosinophils Absolute: 0 10*3/uL (ref 0.0–0.5)
Eosinophils Relative: 0 %
HCT: 27.6 % — ABNORMAL LOW (ref 39.0–52.0)
Hemoglobin: 8.9 g/dL — ABNORMAL LOW (ref 13.0–17.0)
Immature Granulocytes: 1 %
Lymphocytes Relative: 6 %
Lymphs Abs: 0.9 10*3/uL (ref 0.7–4.0)
MCH: 27.6 pg (ref 26.0–34.0)
MCHC: 32.2 g/dL (ref 30.0–36.0)
MCV: 85.4 fL (ref 80.0–100.0)
Monocytes Absolute: 0.5 10*3/uL (ref 0.1–1.0)
Monocytes Relative: 4 %
Neutro Abs: 12.6 10*3/uL — ABNORMAL HIGH (ref 1.7–7.7)
Neutrophils Relative %: 89 %
Platelets: 274 10*3/uL (ref 150–400)
RBC: 3.23 MIL/uL — ABNORMAL LOW (ref 4.22–5.81)
RDW: 17.8 % — ABNORMAL HIGH (ref 11.5–15.5)
WBC: 14.2 10*3/uL — ABNORMAL HIGH (ref 4.0–10.5)
nRBC: 0 % (ref 0.0–0.2)

## 2021-05-27 LAB — BASIC METABOLIC PANEL
Anion gap: 4 — ABNORMAL LOW (ref 5–15)
BUN: 9 mg/dL (ref 8–23)
CO2: 30 mmol/L (ref 22–32)
Calcium: 7.9 mg/dL — ABNORMAL LOW (ref 8.9–10.3)
Chloride: 105 mmol/L (ref 98–111)
Creatinine, Ser: 0.47 mg/dL — ABNORMAL LOW (ref 0.61–1.24)
GFR, Estimated: >60 mL/min (ref >60)
Glucose, Bld: 144 mg/dL — ABNORMAL HIGH (ref 70–99)
Potassium: 3.3 mmol/L — ABNORMAL LOW (ref 3.5–5.1)
Sodium: 139 mmol/L (ref 135–145)

## 2021-05-27 LAB — HEPARIN LEVEL (UNFRACTIONATED): Heparin Unfractionated: 0.42 IU/mL (ref 0.30–0.70)

## 2021-05-27 LAB — MAGNESIUM: Magnesium: 1.7 mg/dL (ref 1.7–2.4)

## 2021-05-27 LAB — URINE CULTURE: Culture: NO GROWTH

## 2021-05-27 MED ORDER — SACCHAROMYCES BOULARDII 250 MG PO CAPS
250.0000 mg | ORAL_CAPSULE | Freq: Two times a day (BID) | ORAL | Status: DC
  Administered 2021-05-27 – 2021-05-29 (×4): 250 mg via ORAL
  Filled 2021-05-27 (×5): qty 1

## 2021-05-27 MED ORDER — POTASSIUM CHLORIDE 10 MEQ/100ML IV SOLN
10.0000 meq | INTRAVENOUS | Status: AC
  Administered 2021-05-27 (×3): 10 meq via INTRAVENOUS
  Filled 2021-05-27 (×3): qty 100

## 2021-05-27 MED ORDER — POTASSIUM CHLORIDE 20 MEQ PO PACK
40.0000 meq | PACK | Freq: Once | ORAL | Status: AC
  Administered 2021-05-27: 40 meq via ORAL
  Filled 2021-05-27: qty 2

## 2021-05-27 MED ORDER — PREDNISONE 5 MG PO TABS
5.0000 mg | ORAL_TABLET | Freq: Every day | ORAL | Status: DC
Start: 2021-05-28 — End: 2021-05-29
  Administered 2021-05-29: 5 mg via ORAL
  Filled 2021-05-27 (×2): qty 1

## 2021-05-27 MED ORDER — POTASSIUM CHLORIDE 20 MEQ PO PACK: 40.0000 meq | PACK | Freq: Two times a day (BID) | ORAL | Status: DC

## 2021-05-27 MED ORDER — MAGNESIUM SULFATE 2 GM/50ML IV SOLN
2.0000 g | Freq: Once | INTRAVENOUS | Status: AC
Start: 2021-05-27 — End: 2021-05-27
  Administered 2021-05-27: 2 g via INTRAVENOUS
  Filled 2021-05-27: qty 50

## 2021-05-27 MED ORDER — FUROSEMIDE 10 MG/ML IJ SOLN
40.0000 mg | Freq: Every day | INTRAMUSCULAR | Status: AC
Start: 2021-05-27 — End: 2021-05-28
  Administered 2021-05-27 – 2021-05-28 (×2): 40 mg via INTRAVENOUS
  Filled 2021-05-27 (×2): qty 4

## 2021-05-27 NOTE — Progress Notes (Addendum)
?PROGRESS NOTE ? ? ? ?Johnathan InchesJames C Novelo Jr.  [id number] DOB: 06/20/1934 DOA: 05/25/2021 ?[facility]  ? ? ? ?Brief Narrative:  ? ?Johnathan InchesJames C Politano Jr. is a 87 y.o. male with medical history significant for vascular dementia, history of CVA with left-sided deficits, seizure disorder, recurrent aspiration pneumonia, HTN, anemia of chronic disease, anxiety who is admitted with extensive acute DVT of the left lower extremity and penile/scrotal edema/volume overload, seen by urology, does not think he has any infection ? ? ?Subjective: ? ?No overnight event reported , ?Vital signes are stable,  ? good urine output, edema appears is improving,On 2liter oxygen, does not appear to be in acute distress ?He is a poor historian, oriented to self only, not able to obtain reliable history ?Per RN from 4/29 " refusing all medications/foods/fluids and interventions.  " Today He appeared to be more corporative and is  taking oral meds  ?Lasix, procalcitonin, antibiotic, transition anticoagulation ? ? ?Assessment & Plan: ? Principal Problem: ?  Acute deep vein thrombosis (DVT) of iliac vein of left lower extremity (HCC) ?Active Problems: ?  Scrotal edema ?  History of cerebrovascular accident (CVA) with residual deficit ?  HTN (hypertension) ?  Seizure disorder (HCC) ?  Dementia with behavioral disturbance (HCC) ?  Aneurysm of right common iliac artery (HCC) ?  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  Anxiety ?  Osteoarthritis ? ? ? ?Assessment and Plan: ? ?* Acute deep vein thrombosis (DVT) of iliac vein of left lower extremity (HCC) ?Acute DVT left external iliac, common femoral, and femoral/profundus femoral veins ?Aneurysmal dilatation right common and internal iliac arteries ?Aneurysmal dilatation left internal iliac artery ?-Findings seen on CT abdomen/pelvis on presentation.  DVT likely secondary to bedbound status.  ? -He has a history of right common iliac  artery dissection which was followed by vascular surgery on an outpatient basis.  vascular surgery Dr Karin Lieuobins recommended continue with anticoagulation and follow-up outpatient for aneurysms, please see his note from 4/29 ?-due to extensive DVT plan for IV heparin x48hrs, then transition to oral anticoagulation once able to take meds reliably, likely will benefit from life long anticoagulation unless contraindicated  ? ? ?Volume overload/ anasarca, bilateral pleural effusion,scrotal edema, lower extremity edema ?Seen by urology who does not think infectious etiology, exam consistence with edema per urology ?No evidence of scrotal gas or abscess on CT imaging.   ?Diuresing well with iv lasix, edema is improving  ? ?Hypokalemia/hypomagnesemia ?Replace ? ?Leukocytosis ?From stress? ?From steroids? ?From Recurrent aspiration? ?MRSA screening negative ,blood culture no growth , C. difficile negative , urine culture in process, cough is nonproductive ?Plan to  taper steroid to off  ?Check procalcitonin, ?Repeat cbc in am ?Vanc discontinued, Continue rocephin for now, plan to d/c rocephin if procalcitonin unremarkable, WBC coming down ? ? ?Dysphagia ?Seen by speech recent hospitalization, was discharged on pur?e and nectar thick liquid, seen by speech again with same recommendation  ?Has intermittent congested weak cough ?Continue aspiration precaution ? ?History of cerebrovascular accident (CVA) with residual deficit/demenita ?Residual left-sided hemiparesis, bedbound status for about two years per family. Does not appear to be on aspirin at this time.  Continue atorvastatin. ?Chronic vascular dementia at risk for delirium.  Placed on delirium precautions. Currently calm ? ?Seizure disorder (HCC) ?Iv Keppra for now, as unreliable oral intake, transition to oral once oral intake is more consistent ? ?Marland Kitchen.Anxiety ?Continue home  meds Ativan  0.5 mg twice daily. ? ?HTN (hypertension) ?Not  on antihypertensives PTA ?  BP mildly  elevated but acceptable at this time. ?Started on iv lasix, monitor bp and cr ? ?Osteoarthritis ?Prednisone 20mg  daily appears is on nursing home record ? Per daughter, patient was on prednisone two year ago for arthritic pain, But not  taking it recently,  ? Plan to taper steroids to off ? ? ?FTT, reportedly bedbound for about 2 years , poor prognosis, palliative care consulted, continue full code, full scope of treatment  ? ?  ? ?Skin Assessment: ?I have examined the patient?s skin and I agree with the wound assessment as performed by the wound care RN as outlined below: ?Pressure Injury 05/16/21 Buttocks Right Unstageable - Full thickness tissue loss in which the base of the injury is covered by slough (yellow, tan, gray, green or brown) and/or eschar (tan, brown or black) in the wound bed. (Active)  ?05/16/21 2100  ?Location: Buttocks  ?Location Orientation: Right  ?Staging: Unstageable - Full thickness tissue loss in which the base of the injury is covered by slough (yellow, tan, gray, green or brown) and/or eschar (tan, brown or black) in the wound bed.  ?Wound Description (Comments):   ?Present on Admission: Yes  ?Dressing Type Foam - Lift dressing to assess site every shift 05/26/21 0800  ? ? ? ?I have Reviewed nursing notes, Vitals, pain scores, I/o's, Lab results and  imaging results since pt's last encounter, details please see discussion above  ?I ordered the following labs:  ?Unresulted Labs (From admission, onward)  ? ?  Start     Ordered  ? 05/28/21 0500  Procalcitonin  Daily,   R     ?Question:  Specimen collection method  Answer:  Lab=Lab collect  ? 05/27/21 0800  ? 05/27/21 0500  Basic metabolic panel  Daily,   R     ?Question:  Specimen collection method  Answer:  Lab=Lab collect  ? 05/26/21 1545  ? 05/27/21 0500  Heparin level (unfractionated)  Daily,   R     ?Question:  Specimen collection method  Answer:  Lab=Lab collect  ? 05/26/21 1919  ? 05/27/21 0500  CBC  Daily,   R     ?Question:   Specimen collection method  Answer:  Lab=Lab collect  ? 05/26/21 1919  ? 05/26/21 1442  Gastrointestinal Panel by PCR , Stool  (Gastrointestinal Panel by PCR, Stool                                                                                                                                                     **Does Not include CLOSTRIDIUM DIFFICILE testing. **If CDIFF testing is needed, place order from the "C Difficile Testing" order set.**)  Once,   R       ? 05/26/21 1441  ?  05/25/21 1719  Urinalysis, Routine w reflex microscopic Urine, Clean Catch  (Septic presentation on arrival (screening labs, nursing and treatment orders for obvious sepsis))  ONCE - URGENT,   URGENT       ? 05/25/21 1719  ? 05/25/21 1719  Urine Culture  (Septic presentation on arrival (screening labs, nursing and treatment orders for obvious sepsis))  ONCE - URGENT,   URGENT       ?Question:  Indication  Answer:  Dysuria  ? 05/25/21 1719  ? ?  ?  ? ?  ? ? ? ?DVT prophylaxis:   On heparin drip ? ? ?Code Status:   Code Status: Full Code ? ?Family Communication: Daughter over the phone on 4/29 ?Disposition:  ? ?Status is: Inpatient ? ?Dispo: The patient is from: snf ?             Anticipated d/c is to: return to snf , recommend palliative care continue to follow patient at snf, at risk of readmission due to overall frailty  ?             Anticipated d/c date is: > 24hrs, on iv lasix and heparin drip currently, need volume improvement and transition to oral anticoagulation , then return to snf, continue aspiration precaution, d/c abx if procalcitonin unremarkable, wbc improves ? ?Antimicrobials:   ? ?Anti-infectives (From admission, onward)  ? ? Start     Dose/Rate Route Frequency Ordered Stop  ? 05/26/21 1900  vancomycin (VANCOCIN) IVPB 1000 mg/200 mL premix  Status:  Discontinued       ? 1,000 mg ?200 mL/hr over 60 Minutes Intravenous Every 24 hours 05/25/21 2307 05/26/21 1223  ? 05/26/21 0500  cefTRIAXone (ROCEPHIN) 1 g in sodium chloride  0.9 % 100 mL IVPB       ? 1 g ?200 mL/hr over 30 Minutes Intravenous Every 24 hours 05/25/21 2255    ? 05/25/21 1745  vancomycin (VANCOREADY) IVPB 1500 mg/300 mL       ? 1,500 mg ?150 mL/hr over 120 Minutes I

## 2021-05-27 NOTE — Progress Notes (Signed)
ANTICOAGULATION CONSULT NOTE ? ?Pharmacy Consult for IV heparin ?Indication:  VTE treatment ? ?Allergies  ?Allergen Reactions  ? Penicillins Hives and Swelling  ? Lactose Intolerance (Gi) Other (See Comments)  ?  unknown  ? Valproate Sodium   ?  Other reaction(s): Other (See Comments)  ? Lexapro [Escitalopram] Diarrhea  ? ? ?Patient Measurements: ?Height: 5\' 6"  (167.6 cm) ?Weight: 67.5 kg (148 lb 13 oz) ?IBW/kg (Calculated) : 63.8 ?Heparin Dosing Weight: 55 kg ? ?Vital Signs: ?Temp: 97.5 ?F (36.4 ?C) (04/30 0800) ?Temp Source: Axillary (04/30 0800) ?BP: 125/87 (04/30 0800) ?Pulse Rate: 73 (04/30 0800) ? ?Labs: ?Recent Labs  ?  05/25/21 ?1719 05/26/21 ?0736 05/26/21 ?1804 05/27/21 ?0258  ?HGB 10.5* 9.2*  --  8.9*  ?HCT 32.0* 29.2*  --  27.6*  ?PLT 254 242  --  274  ?APTT 32  --   --   --   ?LABPROT 13.1  --   --   --   ?INR 1.0  --   --   --   ?HEPARINUNFRC  --  0.78* 0.45 0.42  ?CREATININE 0.57* 0.54*  --  0.47*  ? ? ? ?Estimated Creatinine Clearance: 62 mL/min (A) (by C-G formula based on SCr of 0.47 mg/dL (L)). ? ? ?Medical History: ?Past Medical History:  ?Diagnosis Date  ? Asthma   ? Dementia (HCC)   ? Gout   ? Hypertension   ? Seizures (HCC)   ? Stroke [facility])   ? ? ?Medications:  ?No PTA anticoagulation ? ?Assessment: ?Pharmacy consulted to dose IV heparin for VTE treatment for this 87 y.o. male with history of stroke, hypertension, recent HCAP, seizure, and dementia who returned here this afternoon after being discharged today and presenting with scrotal and penile swelling. ? ?Imaging: ?4/28 CT abdomen pelvis: Extensive deep venous thrombosis involving the LEFT external iliac ?vein, LEFT common femoral vein, and LEFT femoral/profundus femoral veins to the inferior extent of the exam. ? ?Heparin level SUPRAtherapeutic on current IV heparin rate of 900 units/hr ?Hgb 8.9 down from 10.5, Plts stable ?Per RN, no issues or bleeding noted ? ?Goal of Therapy:  ?Heparin level 0.3-0.7 units/ml ?Monitor platelets by  anticoagulation protocol: Yes ?  ?Plan:  ?Continue IV Heparin at current rate of 700 units/hr ?Daily CBC and HL ? ? ?5/28, PharmD, BCPS ?Secure Chat if ?s ?05/27/2021 10:36 AM ? ? ? ? ?

## 2021-05-27 NOTE — Consult Note (Signed)
? ?                                                                                ?Consultation Note ?Date: 05/27/2021  ? ?Patient Name: Johnathan Mitchell.  ?DOB: Aug 02, 1934  MRN: [id number]  Age / Sex: 87 y.o., male  ?[facility] ?Referring Physician: Florencia Reasons, MD ? ?Reason for Consultation: Establishing goals of care ? ?HPI/Patient Profile: 87 y.o. male  admitted on 05/25/2021  ? ?Clinical Assessment and Goals of Care: ?87 year old gentleman with history of vascular dementia, history of stroke with left-sided deficits, history of seizure disorder, recurrent aspiration pneumonia hypertension, was recently discharged from the hospital, readmitted with extensive acute DVT left lower extremity as well as penile/scrotal edema with possible superimposed cellulitis.  Patient is on anticoagulation and antibiotics.  Palliative consulted again. ?Chart reviewed.  Patient seen and examined.  Call placed and discussed with daughter. ?Palliative medicine is specialized medical care for people living with serious illness. It focuses on providing relief from the symptoms and stress of a serious illness. The goal is to improve quality of life for both the patient and the family. ?Goals of care: Broad aims of medical therapy in relation to the patient's values and preferences. Our aim is to provide medical care aimed at enabling patients to achieve the goals that matter most to them, given the circumstances of their particular medical situation and their constraints.  ? ? ?NEXT OF KIN ?Daughter ? ?DISCUSSION/SUMMARY OF RECOMMENDATIONS   ?Patient known to palliative service from recent hospitalization.  Goals of care were discussed in detail with the patient's daughter Johnathan Mitchell.  Call placed and again discussed with her about patient's current condition and scope of current hospitalization.  Discussed about the patient's acute DVT, volume overload and anasarca and ongoing dysphagia.  Goals wishes and values  attempted to be explored again.  Patient's daughter continues to endorse full code/full scope and continuation of current mode of care.  She remains hopeful that the patient will continue to stabilize/recover to some extent.  She is thankful that the DVT was caught and that the patient is on appropriate treatment.  She visits at night and states that the patient responds to her and she is hopeful that he will be able to be resumed on a diet soon.  Active listening, supportive presence and compassionate therapeutic techniques applied at the time of this initial encounter.  No further palliative specific recommendations. ?Thank you for the consult. ? ?Code Status/Advance Care Planning: ?Full code ? ? ?Symptom Management:  ? ? ?Palliative Prophylaxis:  ?Aspiration ? ?Additional Recommendations (Limitations, Scope, Preferences): ?Full Scope Treatment ? ?Psycho-social/Spiritual:  ?Desire for further Chaplaincy support:yes ?Additional Recommendations: Caregiving  Support/Resources ? ?Prognosis:  ?Unable to determine ? ?Discharge Planning: To Be Determined  ? ?  ? ?Primary Diagnoses: ?Present on Admission: ? Acute deep vein thrombosis (DVT) of iliac vein of left lower extremity ([place]) ? Aneurysm of right common iliac artery (HCC) ? 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 Scrotal edema ? Dementia with behavioral disturbance ([place]) ? HTN (hypertension) ? Anxiety ? Osteoarthritis ? ? ?I have reviewed  the medical record, interviewed the patient and family, and examined the patient. The following aspects are pertinent. ? ?Past Medical History:  ?Diagnosis Date  ? Asthma   ? Dementia ([place])   ? Gout   ? Hypertension   ? Seizures ([place])   ? Stroke [facility])   ? ?Social History  ? ?Socioeconomic History  ? Marital status: Single  ?  Spouse name: Not on file  ? Number of children: Not on file  ? Years of education: Not on file  ? Highest education level: Not on file  ?Occupational  History  ? Not on file  ?Tobacco Use  ? Smoking status: Former  ? Smokeless tobacco: Never  ?Vaping Use  ? Vaping Use: Never used  ?Substance and Sexual Activity  ? Alcohol use: No  ? Drug use: No  ? Sexual activity: Not on file  ?Other Topics Concern  ? Not on file  ?Social History Narrative  ? Not on file  ? ?Social Determinants of Health  ? ?Financial Resource Strain: Not on file  ?Food Insecurity: Not on file  ?Transportation Needs: Not on file  ?Physical Activity: Not on file  ?Stress: Not on file  ?Social Connections: Not on file  ? ?Family History  ?Problem Relation Age of Onset  ? Hyperlipidemia Mother   ? Hypertension Mother   ? ?Scheduled Meds: ? atorvastatin  80 mg Oral QHS  ? baclofen  5 mg Oral TID  ? busPIRone  2.5 mg Oral BID  ? Chlorhexidine Gluconate Cloth  6 each Topical Daily  ? furosemide  40 mg Intravenous Daily  ? gabapentin  100 mg Oral BID  ? LORazepam  0.5 mg Oral BID  ? pantoprazole  40 mg Oral Daily  ? predniSONE  10 mg Oral Q breakfast  ? sodium chloride flush  3 mL Intravenous Q12H  ? ?Continuous Infusions: ? sodium chloride    ? cefTRIAXone (ROCEPHIN)  IV Stopped (05/27/21 0459)  ? heparin 700 Units/hr (05/27/21 1224)  ? levETIRAcetam Stopped (05/27/21 [id number])  ? ?PRN Meds:.sodium chloride, acetaminophen **OR** acetaminophen ?Medications Prior to Admission:  ?Prior to Admission medications   ?Medication Sig Start Date End Date Taking? Authorizing Provider  ?acetaminophen (TYLENOL) 500 MG tablet Take 1 tablet (500 mg total) by mouth every 8 (eight) hours. ?Patient taking differently: Take 1,000 mg by mouth 3 (three) times daily. 12/20/19  Yes Mercy Riding, MD  ?atorvastatin (LIPITOR) 80 MG tablet Take 1 tablet (80 mg total) by mouth daily at 6 PM. ?Patient taking differently: Take 80 mg by mouth at bedtime. 08/20/17  Yes Medina-Vargas, Monina C, NP  ?Baclofen 5 MG TABS Take 7.5 mg by mouth 3 (three) times daily. 05/23/21  Yes Pahwani, Einar Grad, MD  ?busPIRone (BUSPAR) 5 MG tablet Take 2.5 mg by  mouth 2 (two) times daily.   Yes [provider]  ?Cholecalciferol 25 MCG (1000 UT) capsule Take 1,000 Units by mouth daily.   Yes [provider]  ?diclofenac Sodium (VOLTAREN) 1 % GEL Apply 2 g topically 4 (four) times daily as needed for pain. 08/16/20  Yes [provider]  ?gabapentin (NEURONTIN) 100 MG capsule Take 100 mg by mouth 2 (two) times daily.   Yes [provider]  ?levETIRAcetam (KEPPRA) 500 MG tablet Take 1 tablet (500 mg total) by mouth 2 (two) times daily. 08/08/20  Yes Lacretia Leigh, MD  ?LORazepam (ATIVAN) 0.5 MG tablet Take 0.5 mg by mouth 2 (two) times daily.   Yes [provider]  ?Multiple Vitamin (MULTIVITAMIN WITH MINERALS) TABS tablet Take 1 tablet by mouth daily.   Yes [provider]  ?silver sulfADIAZINE (SILVADENE) 1 % cream Apply 1 application. topically 2 (two) times daily. Apply to buttock   Yes [provider]  ?vitamin B-12 (CYANOCOBALAMIN) 1000 MCG tablet Take 1,000 mcg by mouth daily.   Yes [provider]  ?Amino Acids-Protein Hydrolys (FEEDING SUPPLEMENT, PRO-STAT SUGAR FREE 64,) LIQD Take 30 mLs by mouth 2 (two) times daily.    [provider]  ?omeprazole (PRILOSEC) 10 MG capsule Take 10 mg by mouth daily.    [provider]  ?potassium chloride (KLOR-CON M) 10 MEQ tablet Take 10 mEq by mouth daily.    [provider]  ?predniSONE (DELTASONE) 10 MG tablet Take 20 mg by mouth daily with breakfast.    [provider]  ? ?Allergies  ?Allergen Reactions  ? Penicillins Hives and Swelling  ? Lactose Intolerance (Gi) Other (See Comments)  ?  unknown  ? Valproate Sodium   ?  Other reaction(s): Other (See Comments)  ? Lexapro [Escitalopram] Diarrhea  ? ?Review of Systems ?Denies pain.  ? ?Physical Exam ?Weak appearing gentleman ?Has baseline dementia ?Diminished breath sounds ?S1-S2 ?Has chronic contractures ?Has lower extremity edema ?Abdomen is not distended ? ?Vital Signs: BP  (!) 158/98   Pulse 62   Temp (!) 97.5 ?F (36.4 ?C) (Axillary)   Resp 10   Ht 5\' 6"  (1.676 m)   Wt 67.5 kg   SpO2 96%   BMI 24.02 kg/m?  ?Pain Scale: PAINAD ?  ?Pain Score: 0-No pain ? ? ?SpO2: SpO2: 96 % ?

## 2021-05-28 DIAGNOSIS — I82422 Acute embolism and thrombosis of left iliac vein: Secondary | ICD-10-CM | POA: Diagnosis not present

## 2021-05-28 DIAGNOSIS — Z7189 Other specified counseling: Secondary | ICD-10-CM | POA: Diagnosis not present

## 2021-05-28 DIAGNOSIS — Z515 Encounter for palliative care: Secondary | ICD-10-CM | POA: Diagnosis not present

## 2021-05-28 LAB — CBC
HCT: 28.2 % — ABNORMAL LOW (ref 39.0–52.0)
Hemoglobin: 8.9 g/dL — ABNORMAL LOW (ref 13.0–17.0)
MCH: 27.2 pg (ref 26.0–34.0)
MCHC: 31.6 g/dL (ref 30.0–36.0)
MCV: 86.2 fL (ref 80.0–100.0)
Platelets: 270 10*3/uL (ref 150–400)
RBC: 3.27 MIL/uL — ABNORMAL LOW (ref 4.22–5.81)
RDW: 18 % — ABNORMAL HIGH (ref 11.5–15.5)
WBC: 11.7 10*3/uL — ABNORMAL HIGH (ref 4.0–10.5)
nRBC: 0 % (ref 0.0–0.2)

## 2021-05-28 LAB — PROCALCITONIN: Procalcitonin: <0.1 ng/mL

## 2021-05-28 LAB — BASIC METABOLIC PANEL
Anion gap: 3 — ABNORMAL LOW (ref 5–15)
BUN: 13 mg/dL (ref 8–23)
CO2: 32 mmol/L (ref 22–32)
Calcium: 8.2 mg/dL — ABNORMAL LOW (ref 8.9–10.3)
Chloride: 106 mmol/L (ref 98–111)
Creatinine, Ser: 0.48 mg/dL — ABNORMAL LOW (ref 0.61–1.24)
GFR, Estimated: >60 mL/min (ref >60)
Glucose, Bld: 101 mg/dL — ABNORMAL HIGH (ref 70–99)
Potassium: 4 mmol/L (ref 3.5–5.1)
Sodium: 141 mmol/L (ref 135–145)

## 2021-05-28 LAB — HEPARIN LEVEL (UNFRACTIONATED): Heparin Unfractionated: 0.48 IU/mL (ref 0.30–0.70)

## 2021-05-28 NOTE — Progress Notes (Signed)
?PROGRESS NOTE ? ?[name].  ?DOB: 1934/08/30  ?[facility] ?ZOX:[id number]  ?DOA: 05/25/2021 ? LOS: 3 days  ?Hospital Day: 4 ? ?Brief narrative: ?Johnathan Mitchell. is a 87 y.o. male with PMH significant for HTN, CVA with left-sided deficits, vascular dementia, seizure disorder, recurrent aspiration pneumonia, Ghimire chronic disease, anxiety. ?Patient was brought [facility] the ED [facility] 4/[facility] within 24 hours of her last discharge for swelling of the lower extremities and the scrotal area. ?Admitted to hospitalist service ?Patient was noted to have an acute DVT of the left lower extremity as well as evidence of volume overload. ? ?Subjective: ?Patient was seen and examined this morning. ?Elderly African-American male with dementia, awake, unable to have a conversation.  Unable to follow commands. ? ?Principal Problem: ?  Acute deep vein thrombosis (DVT) of iliac vein of left lower extremity (HCC) ?Active Problems: ?  Scrotal edema ?  History of cerebrovascular accident (CVA) with residual deficit ?  HTN (hypertension) ?  Seizure disorder (HCC) ?  Dementia with behavioral disturbance (HCC) ?  Aneurysm of right common iliac artery (HCC) ?  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  Anxiety ?  Osteoarthritis ?  ? ? ?Assessment and Plan: ?Acute DVT of left iliac vein, left external iliac, common femoral, and femoral/profundus femoral veins ?Aneurysmal dilatation right common and internal iliac arteries ?Aneurysmal dilatation left internal iliac artery ?-Findings seen on CT abdomen/pelvis on presentation.  ?-DVT likely secondary to bedbound status.  ?-He has a history of right common iliac artery dissection which was followed by vascular surgery on an outpatient basis.  vascular surgery Dr Karin Lieu recommended continue with anticoagulation and follow-up outpatient for aneurysms, please see his note from 4/29 ?-due to extensive DVT, patient  was kept on for IV heparin, then transition to oral anticoagulation ?-Currently remains on heparin drip. ? ?Volume overload ?-anasarca, scrotal edema, lower extremity edema, bilateral pleural effusion ?-CT imaging did not show any evidence of scrotal gas or abscess.  Urology consult appreciated. ?-Patient was diuresed with IV Lasix. ?  ?Hypokalemia/hypomagnesemia ?-Potassium and magnesium level improved with replacement. ?Recent Labs  ?Lab 05/22/21 ?8119 05/23/21 ?1055 05/24/21 ?0704 05/25/21 ?0532 05/25/21 ?1719 05/26/21 ?1478 05/27/21 ?0258 05/28/21 ?0501  ?K 2.3* 2.6* 2.8* 3.3* 3.6 3.4* 3.3* 4.0  ?MG 1.6* 2.1 1.8 1.8  --   --  1.7  --   ?  ?Leukocytosis ?-Likely due to tapering course of steroids. ?-Blood culture did not show any growth.  C. difficile negative, procalcitonin level negative ?-WBC count improving. ?-Currently on IV Rocephin. ?Recent Labs  ?Lab 05/23/21 ?1055 05/25/21 ?1719 05/25/21 ?1940 05/25/21 ?2312 05/26/21 ?2956 05/27/21 ?0258 05/28/21 ?0501  ?WBC 11.8* 16.8*  --   --  15.6* 14.2* 11.7*  ?LATICACIDVEN  --  2.4* 3.6* 1.8  --   --   --   ?PROCALCITON  --   --   --   --  0.11  --  <0.10  ? ?  ?Dysphagia ?Seen by speech recent hospitalization, was discharged on pur?e and nectar thick liquid, seen by speech again with same recommendation  ?Has intermittent congested weak cough ?Continue aspiration precaution ?  ?History of cerebrovascular accident (CVA) with residual deficit/demenita ?Residual left-sided hemiparesis, bedbound status for about two years per family. Does not appear to be on aspirin at this time.  Continue atorvastatin. ?Chronic vascular dementia at risk for delirium.  Placed on delirium precautions.  Currently calm ?  ?Seizure disorder ?Iv Keppra for now, as unreliable oral intake, transition to oral once oral intake is more consistent ?  ?Anxiety ?Continue home  meds Ativan 0.5 mg twice daily. ?  ?Osteoarthritis ?Prednisone 20mg  daily appears is on nursing home record ? Per daughter,  patient was on prednisone two year ago for arthritic pain, But not  taking it recently,  ? Plan to taper steroids to off ?  ?Goals of care ?  Code Status: Full Code  ? ? ?Mobility: Impaired mobility.  Bedbound status ? ?Skin assessment:  ?Pressure Injury 05/16/21 Buttocks Right Unstageable - Full thickness tissue loss in which the base of the injury is covered by slough (yellow, tan, gray, green or brown) and/or eschar (tan, brown or black) in the wound bed. (Active)  ?05/16/21 2100  ?Location: Buttocks  ?Location Orientation: Right  ?Staging: Unstageable - Full thickness tissue loss in which the base of the injury is covered by slough (yellow, tan, gray, green or brown) and/or eschar (tan, brown or black) in the wound bed.  ?Wound Description (Comments):   ?Present on Admission: Yes  ? ? ?Nutritional status:  ?Body mass index is 23.73 kg/m?.  ?  ?  ? ? ? ? ?Diet:  ?Diet Order   ? ?       ?  DIET - DYS 1 Room service appropriate? Yes; Fluid consistency: Nectar Thick  Diet effective now       ?  ? ?  ?  ? ?  ? ? ?DVT prophylaxis: IV heparin ? ?  ?Antimicrobials: IV Rocephin ?Fluid: None ?Consultants: None ?Family Communication: None at bedside ? ?Status is: Inpatient ? ?Continue in-hospital care because: Clinically improving.  Plan to switch to oral anticoagulation tomorrow and hopefully discharge back to nursing home ?Level of care: Telemetry  ? ?Dispo: The patient is from: [facility] ?             Anticipated d/c is to: Hopefully back to [facility] tomorrow ?             Patient currently is not medically stable to d/c. ?  Difficult to place patient No ? ? ? ? ?Infusions:  ? sodium chloride    ? cefTRIAXone (ROCEPHIN)  IV 1 g (05/28/21 0410)  ? heparin 700 Units/hr (05/28/21 1351)  ? levETIRAcetam 500 mg (05/28/21 1101)  ? ? ?Scheduled Meds: ? atorvastatin  80 mg Oral QHS  ? baclofen  5 mg Oral TID  ? busPIRone  2.5 mg Oral BID  ? Chlorhexidine Gluconate Cloth  6 each Topical Daily  ? gabapentin  100 mg Oral  BID  ? LORazepam  0.5 mg Oral BID  ? pantoprazole  40 mg Oral Daily  ? predniSONE  5 mg Oral Q breakfast  ? saccharomyces boulardii  250 mg Oral BID  ? sodium chloride flush  3 mL Intravenous Q12H  ? ? ?PRN meds: ?sodium chloride, acetaminophen **OR** acetaminophen  ? ?Antimicrobials: ?Anti-infectives (From admission, onward)  ? ? Start     Dose/Rate Route Frequency Ordered Stop  ? 05/26/21 1900  vancomycin (VANCOCIN) IVPB 1000 mg/200 mL premix  Status:  Discontinued       ? 1,000 mg ?200 mL/hr over 60 Minutes Intravenous Every 24 hours 05/25/21 2307 05/26/21 1223  ? 05/26/21 0500  cefTRIAXone (ROCEPHIN) 1 g in sodium chloride 0.9 % 100 mL IVPB       ? 1 g ?200 mL/hr over 30 Minutes Intravenous Every  24 hours 05/25/21 2255    ? 05/25/21 1745  vancomycin (VANCOREADY) IVPB 1500 mg/300 mL       ? 1,500 mg ?150 mL/hr over 120 Minutes Intravenous  Once 05/25/21 1731 05/25/21 2053  ? 05/25/21 1730  ceFEPIme (MAXIPIME) 2 g in sodium chloride 0.9 % 100 mL IVPB       ? 2 g ?200 mL/hr over 30 Minutes Intravenous  Once 05/25/21 1719 05/25/21 1758  ? 05/25/21 1730  metroNIDAZOLE (FLAGYL) IVPB 500 mg       ? 500 mg ?100 mL/hr over 60 Minutes Intravenous  Once 05/25/21 1719 05/25/21 1859  ? 05/25/21 1730  vancomycin (VANCOCIN) IVPB 1000 mg/200 mL premix  Status:  Discontinued       ? 1,000 mg ?200 mL/hr over 60 Minutes Intravenous  Once 05/25/21 1719 05/25/21 1731  ? ?  ? ? ?Objective: ?Vitals:  ? 05/28/21 0838 05/28/21 1326  ?BP: 125/86 (!) 126/95  ?Pulse: 69 86  ?Resp: 17 19  ?Temp: 97.9 ?F (36.6 ?C) 97.8 ?F (36.6 ?C)  ?SpO2: 93% 98%  ? ? ?Intake/Output Summary (Last 24 hours) at 05/28/2021 1620 ?Last data filed at 05/28/2021 1217 ?Gross per 24 hour  ?Intake 541.96 ml  ?Output 2100 ml  ?Net -1558.04 ml  ? ?Filed Weights  ? 05/27/21 0500 05/27/21 0600 05/28/21 0411  ?Weight: 67.5 kg 67.5 kg 66.7 kg  ? ?Weight change: -0.8 kg ?Body mass index is 23.73 kg/m?.  ? ?Physical Exam: ?General exam: Elderly African-American male.  Not in  pain ?Skin: No rashes, lesions or ulcers. ?HEENT: Atraumatic, normocephalic, no obvious bleeding ?Lungs: Diminished air entry both bases ?CVS: Regular rate and rhythm, no murmur ?GI/Abd soft, nontender, n

## 2021-05-28 NOTE — Progress Notes (Signed)
? ?                                                                                                                                                     ?                                                   ?Daily Progress Note  ? ?Patient Name: Johnathan Mitchell.       Date: 05/28/2021 ?DOB: 14-Oct-1934  Age: 87 y.o. MRN#: [id number] ?Attending Physician: [name], MD ?Primary Care Physician: [facility] ?Admit Date: 05/25/2021 ? ?Reason for Consultation/Follow-up: Establishing goals of care ? ?Subjective: ? Awake alert, sitting up in bed, undergoing IV placement.  ? ?Length of Stay: 3 ? ?Current Medications: ?Scheduled Meds:  ?? atorvastatin  80 mg Oral QHS  ?? baclofen  5 mg Oral TID  ?? busPIRone  2.5 mg Oral BID  ?? Chlorhexidine Gluconate Cloth  6 each Topical Daily  ?? gabapentin  100 mg Oral BID  ?? LORazepam  0.5 mg Oral BID  ?? pantoprazole  40 mg Oral Daily  ?? predniSONE  5 mg Oral Q breakfast  ?? saccharomyces boulardii  250 mg Oral BID  ?? sodium chloride flush  3 mL Intravenous Q12H  ? ? ?Continuous Infusions: ?? sodium chloride    ?? cefTRIAXone (ROCEPHIN)  IV 1 g (05/28/21 0410)  ?? heparin 700 Units/hr (05/28/21 0200)  ?? levETIRAcetam Stopped (05/28/21 0007)  ? ? ?PRN Meds: ?sodium chloride, acetaminophen **OR** acetaminophen ? ?Physical Exam         ?Awake alert ?Regular work of breathing noted ?Undergoing IV placement ?Mild distress ?Trace edema ? ?Vital Signs: BP 125/86 (BP Location: Left Arm)   Pulse 69   Temp 97.9 ?F (36.6 ?C) (Oral)   Resp 17   Ht 5\' 6"  (1.676 m)   Wt 66.7 kg   SpO2 93%   BMI 23.73 kg/m?  ?SpO2: SpO2: 93 % ?O2 Device: O2 Device: Nasal Cannula ?O2 Flow Rate: O2 Flow Rate (L/min): 2 L/min ? ?Intake/output summary:  ?Intake/Output Summary (Last 24 hours) at 05/28/2021 1056 ?Last data filed at 05/28/2021 0846 ?Gross per 24 hour  ?Intake 843.07 ml  ?Output 2150 ml  ?Net -1306.93 ml  ? ?LBM: Last BM Date : 05/26/21 ?Baseline Weight: Weight: 55 kg ?Most recent weight:  Weight: 66.7 kg ? ?     ?Palliative Assessment/Data: ? ? ? ? ? ?Patient Active Problem List  ? Diagnosis Date Noted  ?? Acute deep vein thrombosis (DVT) of iliac vein of left lower extremity ([place]) 05/25/2021  ?? Aneurysm of right common iliac artery ([place]) 05/25/2021  ?? Aneurysm of right internal  iliac artery ([place]) 05/25/2021  ?? Aneurysm of left internal iliac artery ([place]) 05/25/2021  ?? Scrotal edema 05/25/2021  ?? History of cerebrovascular accident (CVA) with residual deficit 05/25/2021  ?? Anxiety 05/25/2021  ?? Osteoarthritis 05/25/2021  ?? Hypomagnesemia 05/22/2021  ?? Goals of care, counseling/discussion   ?? Palliative care by specialist   ?? Aspiration pneumonia ([place]) 05/16/2021  ?? Pressure injury of skin 03/13/2021  ?? Hypophosphatemia 03/13/2021  ?? HCAP (healthcare-associated pneumonia) 03/13/2021  ?? Hypokalemia 03/12/2021  ?? Dehydration 03/12/2021  ?? Prolonged QT interval 03/12/2021  ?? Generalized weakness 03/12/2021  ?? Seizure disorder ([place]) 03/12/2021  ?? Cellulitis of left hand 12/15/2019  ?? Wrist pain 12/15/2019  ?? Closed left acetabular fracture ([place]) 12/14/2019  ?? SIRS (systemic inflammatory response syndrome) ([place]) 12/14/2019  ?? Protein-calorie malnutrition ([place]) 05/20/2017  ?? TIA (transient ischemic attack) 05/20/2017  ?? Iliac artery dissection ([place]) 05/20/2017  ?? AKI (acute kidney injury) ([place]) 05/12/2017  ?? Stroke ([place]) 05/11/2017  ?? Femur fracture ([place]) 05/11/2017  ?? Chest pain 02/09/2016  ?? HTN (hypertension) 02/09/2016  ?? Dementia with behavioral disturbance ([place]) 02/09/2016  ?? Chest pain at rest 02/09/2016  ?? Cerebral infarction ([place]) 02/01/2014  ?? Benign essential HTN 02/01/2014  ? ? ?Palliative Care Assessment & Plan  ? ?Patient Profile: ?  ? ?Assessment: ? 87 year old gentleman with history of vascular dementia, history of stroke with left-sided deficits, history of seizure disorder, recurrent aspiration pneumonia hypertension, was recently discharged from the  hospital, readmitted with extensive acute DVT left lower extremity as well as penile/scrotal edema with possible superimposed cellulitis.  Patient is on anticoagulation and antibiotics. ? ?Recommendations/Plan: ? Palliative medicine team has been consulted for ongoing goals of care discussions, his daughter Odie Sera is primary Media planner. Plan remains for full code, full scope care, continue current PO with aspiration precautions and re attempt SNF rehab. PMT will shadow peripherally for now.  ?  ? ?Code Status: ? ?  ?Code Status Orders  ?(From admission, onward)  ?  ? ? ?  ? ?  Start     Ordered  ? 05/25/21 2105  Full code  Continuous       ? 05/25/21 2107  ? ?  ?  ? ?  ? ?Code Status History   ? ? Date Active Date Inactive Code Status Order ID Comments User Context  ? 05/16/2021 1727 05/25/2021 1702 Full Code [id number]  Donne Hazel, MD ED  ? 03/12/2021 0409 03/15/2021 1924 Full Code [id number]  Rhetta Mura, DO ED  ? 12/14/2019 1842 12/20/2019 1816 Full Code [id number]  Reubin Milan, MD ED  ? 12/14/2019 1842 12/14/2019 1842 Full Code [id number]  Reubin Milan, MD ED  ? 05/11/2017 2146 05/16/2017 2308 Full Code [id number]  Jani Gravel, MD Inpatient  ? 02/01/2014 1726 02/02/2014 2133 Full Code [id number]  Jonetta Osgood, MD ED  ? ?  ? ? ?Prognosis: ? Guarded  ? ?Discharge Planning: ?[place] for rehab with Palliative care service follow-up ? ?Care plan was discussed with IDT ? ?Thank you for allowing the Palliative Medicine Team to assist in the care of this patient. ? ? ?Time In: 9 Time Out: 9.25 Total Time 25 Prolonged Time Billed  no   ? ?   ?Greater than 50%  of this time was spent counseling and coordinating care related to the above assessment and plan. ? ?Loistine Chance, MD ? ?Please contact Palliative Medicine Team phone at [phone] for questions and concerns.  ? ? ? ? ? ?

## 2021-05-28 NOTE — Progress Notes (Signed)
ANTICOAGULATION CONSULT NOTE ? ?Pharmacy Consult for IV heparin ?Indication:  VTE treatment ? ?Allergies  ?Allergen Reactions  ? Penicillins Hives and Swelling  ? Lactose Intolerance (Gi) Other (See Comments)  ?  unknown  ? Valproate Sodium   ?  Other reaction(s): Other (See Comments)  ? Lexapro [Escitalopram] Diarrhea  ? ? ?Patient Measurements: ?Height: 5\' 6"  (167.6 cm) ?Weight: 66.7 kg (147 lb 0.8 oz) ?IBW/kg (Calculated) : 63.8 ?Heparin Dosing Weight: 55 kg ? ?Vital Signs: ?Temp: 98.5 ?F (36.9 ?C) (05/01 09-13-1997) ?Temp Source: Oral (04/30 2011) ?BP: 141/88 (05/01 0438) ?Pulse Rate: 63 (05/01 0438) ? ?Labs: ?Recent Labs  ?  05/25/21 ?1719 05/25/21 ?1719 05/26/21 ?0736 05/26/21 ?1804 05/27/21 ?0258 05/28/21 ?0501  ?HGB 10.5*  --  9.2*  --  8.9* 8.9*  ?HCT 32.0*  --  29.2*  --  27.6* 28.2*  ?PLT 254  --  242  --  274 270  ?APTT 32  --   --   --   --   --   ?LABPROT 13.1  --   --   --   --   --   ?INR 1.0  --   --   --   --   --   ?HEPARINUNFRC  --    < > 0.78* 0.45 0.42 0.48  ?CREATININE 0.57*  --  0.54*  --  0.47* 0.48*  ? < > = values in this interval not displayed.  ? ? ? ?Estimated Creatinine Clearance: 62 mL/min (A) (by C-G formula based on SCr of 0.48 mg/dL (L)). ? ? ?Medical History: ?Past Medical History:  ?Diagnosis Date  ? Asthma   ? Dementia (HCC)   ? Gout   ? Hypertension   ? Seizures (HCC)   ? Stroke [facility])   ? ? ?Medications:  ?No PTA anticoagulation ? ?Assessment: ?Pharmacy consulted to dose IV heparin for VTE treatment for this 87 y.o. male with history of stroke, hypertension, recent HCAP, seizure, and dementia who returned here this afternoon after being discharged today and presenting with scrotal and penile swelling. ? ?Imaging: ?4/28 CT abdomen pelvis: Extensive deep venous thrombosis involving the LEFT external iliac ?vein, LEFT common femoral vein, and LEFT femoral/profundus femoral veins to the inferior extent of the exam. ? ?Heparin level therapeutic on current heparin rate of 700 units/hr ?Hgb  stable around 9, Plts stable ?No issues or bleeding noted ? ?Goal of Therapy:  ?Heparin level 0.3-0.7 units/ml ?Monitor platelets by anticoagulation protocol: Yes ?  ?Plan:  ?Continue IV Heparin at current rate of 700 units/hr ?Daily CBC and HL ? ? ?5/28, PharmD, BCPS ?Clinical Pharmacist ?05/28/2021 7:10 AM ? ? ? ? ? ?

## 2021-05-29 LAB — CBC
HCT: 27.9 % — ABNORMAL LOW (ref 39.0–52.0)
Hemoglobin: 8.5 g/dL — ABNORMAL LOW (ref 13.0–17.0)
MCH: 27.1 pg (ref 26.0–34.0)
MCHC: 30.5 g/dL (ref 30.0–36.0)
MCV: 88.9 fL (ref 80.0–100.0)
Platelets: 268 10*3/uL (ref 150–400)
RBC: 3.14 MIL/uL — ABNORMAL LOW (ref 4.22–5.81)
RDW: 18.2 % — ABNORMAL HIGH (ref 11.5–15.5)
WBC: 9.4 10*3/uL (ref 4.0–10.5)
nRBC: 0.2 % (ref 0.0–0.2)

## 2021-05-29 LAB — HEPARIN LEVEL (UNFRACTIONATED): Heparin Unfractionated: 0.35 IU/mL (ref 0.30–0.70)

## 2021-05-29 LAB — BASIC METABOLIC PANEL
Anion gap: 4 — ABNORMAL LOW (ref 5–15)
BUN: 16 mg/dL (ref 8–23)
CO2: 33 mmol/L — ABNORMAL HIGH (ref 22–32)
Calcium: 8 mg/dL — ABNORMAL LOW (ref 8.9–10.3)
Chloride: 105 mmol/L (ref 98–111)
Creatinine, Ser: 0.56 mg/dL — ABNORMAL LOW (ref 0.61–1.24)
GFR, Estimated: >60 mL/min (ref >60)
Glucose, Bld: 92 mg/dL (ref 70–99)
Potassium: 3.8 mmol/L (ref 3.5–5.1)
Sodium: 142 mmol/L (ref 135–145)

## 2021-05-29 MED ORDER — APIXABAN 5 MG PO TABS: 5.0000 mg | ORAL_TABLET | Freq: Two times a day (BID) | ORAL | Status: AC

## 2021-05-29 MED ORDER — APIXABAN 5 MG PO TABS: 5.0000 mg | ORAL_TABLET | Freq: Two times a day (BID) | ORAL | Status: DC

## 2021-05-29 MED ORDER — PREDNISONE 5 MG PO TABS
5.0000 mg | ORAL_TABLET | Freq: Every day | ORAL | Status: AC
Start: 2021-05-30 — End: 2021-06-04

## 2021-05-29 MED ORDER — APIXABAN 5 MG PO TABS
10.0000 mg | ORAL_TABLET | Freq: Two times a day (BID) | ORAL | Status: DC
Start: 2021-05-29 — End: 2021-06-23

## 2021-05-29 MED ORDER — APIXABAN 5 MG PO TABS
10.0000 mg | ORAL_TABLET | Freq: Two times a day (BID) | ORAL | Status: DC
  Administered 2021-05-29: 10 mg via ORAL
  Filled 2021-05-29: qty 2

## 2021-05-29 NOTE — Discharge Summary (Signed)
? ?Physician Discharge Summary  ?Johnathan Mitchell. [id number] DOB: 09/06/1934 DOA: 05/25/2021 ? ?[facility] ? ?Admit date: 05/25/2021 ?Discharge date: 05/29/2021 ? ?Admitted From: [facility] facility ?Discharge disposition: Back to nursing facility ? ?Brief narrative: ?Johnathan Mitchell. is a 87 y.o. male with PMH significant for HTN, CVA with left-sided deficits, vascular dementia, seizure disorder, recurrent aspiration pneumonia, anemia of chronic disease, anxiety. ?Patient was brought to the ED on 4/28 from [facility] within 24 hours of her last discharge for swelling of the lower extremities and the scrotal area. ?Admitted to hospitalist service ?Patient was noted to have an acute DVT of the left lower extremity as well as evidence of volume overload. ? ?Subjective: ?Patient was seen and examined this morning. ?Elderly African-American male with dementia, awake, unable to have a conversation.   ?Unable to follow commands. ? ?Principal Problem: ?  Acute deep vein thrombosis (DVT) of iliac vein of left lower extremity ([place]) ?Active Problems: ?  Scrotal edema ?  History of cerebrovascular accident (CVA) with residual deficit ?  HTN (hypertension) ?  Seizure disorder ([place]) ?  Dementia with behavioral disturbance ([place]) ?  Aneurysm of right common iliac artery (HCC) ?  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  Anxiety ?  Osteoarthritis ?  ? ? ?Hospital course: ?Acute DVT of left iliac vein, left external iliac, common femoral, and femoral/profundus femoral veins ?Aneurysmal dilatation right common and internal iliac arteries ?Aneurysmal dilatation left internal iliac artery ?-Findings seen on CT abdomen/pelvis on presentation.  ?-DVT likely secondary to bedbound status.  ?-He has a history of right common iliac artery dissection which was followed by vascular surgery on an outpatient basis.  vascular surgery Dr Virl Cagey recommended  continue with anticoagulation and follow-up outpatient for aneurysms, please see his note from 4/29 ?-due to extensive DVT, patient was kept on for IV heparin.  He was transitioned to oral anticoagulation with Eliquis this morning.  Continue the same post discharge. ? ?Volume overload ?-Presented with anasarca, scrotal edema, lower extremity edema, bilateral pleural effusion ?-CT imaging did not show any evidence of scrotal gas or abscess.  Urology consult appreciated. ?-Patient was diuresed with IV Lasix. ?-Currently does not look volume overloaded. ?  ?Hypokalemia/hypomagnesemia ?-Potassium and magnesium level improved with replacement. ?Recent Labs  ?Lab 05/23/21 ?1055 05/24/21 ?0704 05/25/21 ?0532 05/25/21 ?1719 05/26/21 ?[id number] 05/27/21 ?0258 05/28/21 ?0501 05/29/21 ?0403  ?K 2.6* 2.8* 3.3* 3.6 3.4* 3.3* 4.0 3.8  ?MG 2.1 1.8 1.8  --   --  1.7  --   --   ?  ?Leukocytosis ?-Likely due to tapering course of steroids. ?-Blood culture did not show any growth.  C. difficile negative, procalcitonin level negative ?-WBC count improving.  No fever. ?-Completed a course of IV Rocephin. ?Recent Labs  ?Lab 05/25/21 ?1719 05/25/21 ?1940 05/25/21 ?2312 05/26/21 ?[id number] 05/27/21 ?0258 05/28/21 ?0501 05/29/21 ?0403  ?WBC 16.8*  --   --  15.6* 14.2* 11.7* 9.4  ?LATICACIDVEN 2.4* 3.6* 1.8  --   --   --   --   ?PROCALCITON  --   --   --  0.11  --  <0.10  --   ?  ?Dysphagia ?Seen by speech recent hospitalization, was discharged on pur?e and nectar thick liquid, seen by speech again with same recommendation  ?Has intermittent congested weak cough ?Continue aspiration precaution ?  ?History of cerebrovascular accident (CVA) with residual deficit/demenita ?-Residual left-sided  hemiparesis, bedbound status for about two years per family. Does not appear to be on aspirin at this time.  Continue atorvastatin. ?-Chronic vascular dementia at risk for delirium. Currently calm ?  ?Seizure disorder ?-Continue oral Keppra. ?  ?Anxiety ?-Continue  Ativan 0.5 mg twice daily as before. ?  ?Osteoarthritis ?Prednisone 20mg  daily appears is on nursing home record ? Per daughter, patient was on prednisone two year ago for arthritic pain, But not  taking it recently,  ?Prednisone was tapered down in the hospital.  Continue 5 mg daily for next 5 days and stop ?  ?Goals of care ?  Code Status: Full Code  ? ? ?Mobility: Impaired mobility.  Bedbound status ? ?Skin assessment:  ?Pressure Injury 05/16/21 Buttocks Right Unstageable - Full thickness tissue loss in which the base of the injury is covered by slough (yellow, tan, gray, green or brown) and/or eschar (tan, brown or black) in the wound bed. (Active)  ?05/16/21 2100  ?Location: Buttocks  ?Location Orientation: Right  ?Staging: Unstageable - Full thickness tissue loss in which the base of the injury is covered by slough (yellow, tan, gray, green or brown) and/or eschar (tan, brown or black) in the wound bed.  ?Wound Description (Comments):   ?Present on Admission: Yes  ? ? ?Nutritional status:  ?Body mass index is 23.73 kg/m?.  ?  ?  ? ? ? ? ? ? ?Diet:  ?Diet Order   ? ?       ?  Diet general       ?  ?  DIET - DYS 1 Room service appropriate? Yes; Fluid consistency: Nectar Thick  Diet effective now       ?  ? ?  ?  ? ?  ? ? ? ? ?Wounds:  ?- ?Pressure Injury 05/16/21 Buttocks Right Unstageable - Full thickness tissue loss in which the base of the injury is covered by slough (yellow, tan, gray, green or brown) and/or eschar (tan, brown or black) in the wound bed. (Active)  ?Date First Assessed/Time First Assessed: 05/16/21 2100   Location: Buttocks  Location Orientation: Right  Staging: Unstageable - Full thickness tissue loss in which the base of the injury is covered by slough (yellow, tan, gray, green or brown) and/or...  ?  ?Assessments 03/12/2021  7:46 PM 05/27/2021  8:00 AM  ?Dressing Type -- Foam - Lift dressing to assess site every shift  ?Dressing -- Clean, Dry, Intact  ?Site / Wound Assessment -- Dressing in  place / Unable to assess  ?Wound Length (cm) 1 cm --  ?Wound Width (cm) 1 cm --  ?Wound Depth (cm) 0 cm --  ?Wound Surface Area (cm^2) 1 cm^2 --  ?Wound Volume (cm^3) 0 cm^3 --  ?   ?No Linked orders to display  ?   ?Wound / Incision (Open or Dehisced) 05/26/21 (MASD) Moisture Associated Skin Damage Scrotum Bilateral MASD to scrotum (Active)  ?Date First Assessed/Time First Assessed: 05/26/21 0800   Wound Type: (MASD) Moisture Associated Skin Damage  Location: Scrotum  Location Orientation: Bilateral  Wound Description (Comments): MASD to scrotum  Present on Admission: Yes  ?  ?Assessments 05/26/2021  8:00 AM 05/27/2021  8:00 AM  ?Dressing Type Moisture barrier None  ?Site / Wound Assessment Pink Pink  ?Peri-wound Assessment Edema;Erythema (blanchable) Erythema (blanchable)  ?Drainage Amount -- None  ?Treatment Cleansed --  ?   ?No Linked orders to display  ? ? ?Discharge Exam:  ? ?Vitals:  ? 05/28/21 [id number] 05/28/21  1326 05/28/21 2011 05/29/21 0511  ?BP: 125/86 (!) 126/95 113/76 110/78  ?Pulse: 69 86 84 67  ?Resp: 17 19 18 16   ?Temp: 97.9 ?F (36.6 ?C) 97.8 ?F (36.6 ?C) 98.3 ?F (36.8 ?C) 97.9 ?F (36.6 ?C)  ?TempSrc: Oral Oral Oral   ?SpO2: 93% 98% 100% 100%  ?Weight:      ?Height:      ? ? ?Body mass index is 23.73 kg/m?.  ?General exam: Elderly African-American male.  Not in pain ?Skin: No rashes, lesions or ulcers. ?HEENT: Atraumatic, normocephalic, no obvious bleeding ?Lungs: Diminished air entry both bases ?CVS: Regular rate and rhythm, no murmur ?GI/Abd soft, nontender, nondistended, bowel sound present ?CNS: Alert, awake, demented, does not follow command ?Psychiatry: Sad affect ?Extremities: Improving left pedal edema ? ?Follow ups:  ? ? Follow-up Information   ? ? [facility]   ?Contact information: ?[address] ?[place] [address] ?[phone] ? ? ?  ?  ? ?  ?  ? ?  ? ? ?Discharge Instructions:  ? ?Discharge Instructions   ? ? Call MD for:  difficulty breathing, headache or visual  disturbances   Complete by: As directed ?  ? Call MD for:  extreme fatigue   Complete by: As directed ?  ? Call MD for:  hives   Complete by: As directed ?  ? Call MD for:  persistant dizziness or light-head

## 2021-05-29 NOTE — Discharge Instructions (Addendum)
Information on my medicine - ELIQUIS (apixaban) ? ?This medication education was reviewed with me or my healthcare representative as part of my discharge preparation. ? ?WHY WAS ELIQUIS PRESCRIBED FOR YOU? ?Eliquis was prescribed to treat blood clots that may have been found in the veins of your legs (deep vein thrombosis) or in your lungs (pulmonary embolism) and to reduce the risk of them occurring again. ? ?WHAT DO YOU NEED TO KNOW ABOUT ELIQUIS ? ?The starting dose is 10 mg (two 5 mg tablets) taken TWICE daily for the FIRST SEVEN (7) DAYS, then on 06/05/21 the dose is reduced to ONE 5 mg tablet taken TWICE daily. Eliquis may be taken with or without food. Try to take the dose about the same time in the morning and in the evening. If you have difficulty swallowing the tablet whole please discuss with your pharmacist how to take the medication safely. ?Take Eliquis exactly as prescribed and DO NOT stop taking Eliquis without talking to the doctor who prescribed the medication. Stopping may increase your risk of developing a new blood clot. Refill your prescription before you run out. After discharge, you should have regular check-up appointments with your healthcare provider that is prescribing your Eliquis. ? ?WHAT DO YOU DO IF YOU MISS A DOSE? ?If a dose of ELIQUIS is not taken at the scheduled time, take it as soon as possible on the same day and twice-daily administration should be resumed. The dose should not be doubled to make up for a missed dose. ? ?IMPORTANT SAFETY INFORMATION ?A possible side effect of Eliquis is bleeding. You should call your healthcare provider right away if you experience any of the following: ?? Bleeding from an injury or your nose that does not stop. ?? Unusual colored urine (red or dark brown) or unusual colored stools (red or black). ?? Unusual bruising for unknown reasons. ?? A serious fall or if you hit your head (even if there is no bleeding). ?Some medicines may interact with  Eliquis and might increase your risk of bleeding or clotting while on Eliquis. To help avoid this, consult your healthcare provider or pharmacist prior to using any new prescription or non-prescription medications, including herbals, vitamins, non-steroidal anti-inflammatory drugs (NSAIDs) and supplements. ? ?This website has more information on Eliquis (apixaban): [email or web address].cy. ?

## 2021-05-29 NOTE — Progress Notes (Addendum)
ANTICOAGULATION CONSULT NOTE ? ?Pharmacy Consult for Eliquis ?Indication:  VTE treatment ? ?Allergies  ?Allergen Reactions  ? Penicillins Hives and Swelling  ? Lactose Intolerance (Gi) Other (See Comments)  ?  unknown  ? Valproate Sodium   ?  Other reaction(s): Other (See Comments)  ? Lexapro [Escitalopram] Diarrhea  ? ? ?Patient Measurements: ?Height: 5\' 6"  (167.6 cm) ?Weight: 66.7 kg (147 lb 0.8 oz) ?IBW/kg (Calculated) : 63.8 ?Heparin Dosing Weight: 55 kg ? ?Vital Signs: ?Temp: 97.9 ?F (36.6 ?C) (05/02 05-20-1999) ?Temp Source: Oral (05/01 2011) ?BP: 110/78 (05/02 0511) ?Pulse Rate: 67 (05/02 0511) ? ?Labs: ?Recent Labs  ?  05/27/21 ?0258 05/28/21 ?0501 05/29/21 ?0403  ?HGB 8.9* 8.9* 8.5*  ?HCT 27.6* 28.2* 27.9*  ?PLT 274 270 268  ?HEPARINUNFRC 0.42 0.48 0.35  ?CREATININE 0.47* 0.48* 0.56*  ? ? ? ?Estimated Creatinine Clearance: 62 mL/min (A) (by C-G formula based on SCr of 0.56 mg/dL (L)). ? ? ?Medical History: ?Past Medical History:  ?Diagnosis Date  ? Asthma   ? Dementia (HCC)   ? Gout   ? Hypertension   ? Seizures (HCC)   ? Stroke [facility])   ? ? ?Medications:  ?No PTA anticoagulation ? ?Assessment: ?Pharmacy initially consulted to dose IV heparin for VTE treatment for this 87 y.o. male with history of stroke, hypertension, recent HCAP, seizure, and dementia who returned here this afternoon after being discharged today and presenting with scrotal and penile swelling. ? ?Imaging: ?4/28 CT abdomen pelvis: Extensive deep venous thrombosis involving the LEFT external iliac ?vein, LEFT common femoral vein, and LEFT femoral/profundus femoral veins to the inferior extent of the exam. ? ?Patient has been on IV heparin. Pharmacy has been consulted to transition to Eliquis. ?  ?Plan: ?-Heparin to be turned off when first dose of Eliquis given ?-Eliquis 10 mg BID x 7 days, followed by 5 mg BID ?-Monitor CBC as indicated ? ? ?5/28, PharmD, BCPS ?Clinical Pharmacist ?05/29/2021 7:35 AM ? ? ? ? ? ?

## 2021-05-29 NOTE — Plan of Care (Signed)
  Problem: Clinical Measurements: Goal: Will remain free from infection Outcome: Progressing   Problem: Coping: Goal: Level of anxiety will decrease Outcome: Progressing   Problem: Safety: Goal: Ability to remain free from injury will improve Outcome: Progressing   

## 2021-05-30 LAB — CULTURE, BLOOD (ROUTINE X 2)
Culture: NO GROWTH
Special Requests: ADEQUATE

## 2021-05-31 LAB — CULTURE, BLOOD (ROUTINE X 2)
Culture: NO GROWTH
Special Requests: ADEQUATE

## 2021-06-20 ENCOUNTER — Non-Acute Institutional Stay: Payer: Medicare Other | Admitting: Internal Medicine

## 2021-06-20 VITALS — BP 137/81 | HR 86 | Temp 97.8°F | Resp 18 | Ht 68.0 in | Wt 134.6 lb

## 2021-06-20 DIAGNOSIS — G40909 Epilepsy, unspecified, not intractable, without status epilepticus: Secondary | ICD-10-CM

## 2021-06-20 DIAGNOSIS — R1319 Other dysphagia: Secondary | ICD-10-CM

## 2021-06-20 DIAGNOSIS — F419 Anxiety disorder, unspecified: Secondary | ICD-10-CM

## 2021-06-20 DIAGNOSIS — I69354 Hemiplegia and hemiparesis following cerebral infarction affecting left non-dominant side: Secondary | ICD-10-CM

## 2021-06-20 DIAGNOSIS — Z515 Encounter for palliative care: Secondary | ICD-10-CM

## 2021-06-20 DIAGNOSIS — F01C4 Vascular dementia, severe, with anxiety: Secondary | ICD-10-CM

## 2021-06-20 NOTE — Progress Notes (Signed)
[facility] [facility] Palliative Care Consult Note Telephone: [phone]  Fax: [phone]   Date of encounter: 06/21/21 3:49 PM PATIENT NAME: Johnathan Mitchell [id number]   [phone] (home)  DOB: [birth year] MRN: [id number] PRIMARY CARE PROVIDER:    Administration, [place],  [place] [address] [phone]  REFERRING PROVIDER:   Administration, [place],  [place] [address] [phone]  RESPONSIBLE PARTY:    Contact Information     Name Relation Home Work Johnathan Mitchell Daughter [phone]  [phone]   Johnathan, Mitchell [phone]  [phone]   Johnathan Mitchell (SIL)Johnathan Mitchell   [phone]        I met face to face with patient and family in [facility]. Palliative Care was asked to follow this patient by consultation request of Johnathan Mitchell, Johnathan Gell, MD to address advance care planning and complex medical decision making. This is the initial visit.                                     ASSESSMENT AND PLAN / RECOMMENDATIONS:   Advance Care Planning/Goals of Care: Goals include to maximize quality of life and symptom management. Patient/health care surrogate gave his/her permission to discuss.Our advance care planning conversation included a discussion about:    The value and importance of advance care planning  Experiences with loved ones who have been seriously ill or have died  Exploration of personal, cultural or spiritual beliefs that might influence medical decisions  Exploration of goals of care in the event of a sudden injury or illness  Identification  of a healthcare agent  Review and updating or creation of an  advance directive document . Decision not to resuscitate or to de-escalate disease focused treatments due to poor prognosis. CODE STATUS:  currently full code with full scope but upon discussing with Johnathan Mitchell, she wants her  dad to be comfortable and not to suffer--after hearing what the CPR and ventilator would be like, she was reconsidering these selections, but did want to wait to redo the MOST form until she knew he'd for certain be admitted to hospice   Symptom Management/Plan: 1. Anxiety -recommend increasing lorazepam to 0.78m po q6h as pt seems like he's upset about something that we cannot seem--it does not seem he is in pain to me today, but is yelling out  2. Severe vascular dementia with anxiety (HCC) -not on dementia meds -adjust ativan for his anxiety, also on buspar  3. Hemiparesis affecting left side as late effect of stroke (Johnathan Mitchell -affects ability to be upright and puts at risk for aspiration even more -is bedbound/hoyer to transfer to wheelchair -continues baclofen for spasticity  4. Dysphagia causing pulmonary aspiration with swallowing -severe, continue puree with NTL and careful hand feeding, aspiration precautions with sitting upright, but still likely to recur as this typically progresses as dementia declines  5. Seizure disorder (Johnathan Mitchell -continue current seizure medication as above, none recently reported  6. Palliative care by specialist -recommend hospice evaluation due to rapid decline since last series of admissions, PPS 30, poor intake with weight loss and now dysphagia with aspiration, plus Johnathan Reefwould like more eyes on Mitchell to ensure his comfort and QOL as she cannot be there regularly to care for Mitchell due to her own responsibilities -connected with social workers who are getting order from  primary team to be sent to hospice referral center--email also sent giving them a heads up so when pt off therapy days, he can transition over   Follow up Palliative Care Visit:  hospice AV recommended  This visit was coded based on medical decision making (MDM). 35 mins spent on ACP during phone call with Johnathan Mitchell.  PPS: 30%  HOSPICE ELIGIBILITY/DIAGNOSIS: Cerebrovascular disease with vascular  dementia, prior stroke  Chief Complaint: Initial palliative care consult  HISTORY OF PRESENT ILLNESS:  Johnathan Mitchell. is a 87 y.o. year old male  with distant stroke, longstanding vascular dementia, htn, LLE DVT, asthma, seizure disorder since stroke, new diagnosis of hidradenitis suppurativa, and protein calorie malnutrition plus new onset challenges with dysphagia and aspiration pneumonia.  We were asked to see Mitchell due to decline since his April hospitalizations.  He's been a long-term resident of [facility] as his daughter works and is a Charity fundraiser.    He has a h/o hospitalizations this year including 2/10 ED visit, 2/12 seizure-like activity and h/o iliac artery dissection admission, 4/11 ED visit with fall from bed, 4/19-28 admission with POX 78% amid aspiration pneumonia.  He was sent back on pureed diet with nectar-thickened liquids at that point.  He was immediately readmitted with LLE DVT that had caused scrotal and LE edema.  He was also just newly diagnosed and treated for hidradenitis suppurativa.    He has been losing weight, his intake is less than 25% of meals, he's holding food in his mouth and his pills.  He has chronic left hemiparesis from his prior stroke.  He now leans left.  When seen, he was up in a wheelchair (hoyer transfer) and drooling food/milk/supplement shake all down Mitchell and coughing and gurgling.  His dental hygiene appeared poor.  He denied pain.  Reportedly, he can feed himself with cues but was not able to follow any commands for me when I visited.  Notes indicate an unstageable wound to his sacrum mentioned at the hospital.    History obtained from review of EMR, discussion with primary team, and interview with family, facility staff/caregiver and/or Johnathan Mitchell.  I reviewed available labs, medications, imaging, studies and related documents from the EMR.  Records reviewed and summarized above.   ROS  General: yelling out, denies pain EYES: denies  vision changes--daughter reports he communicates with his eyes ENMT: has dysphagia Cardiovascular: denies chest pain, denies DOE Pulmonary: has cough, denies increased SOB Abdomen: endorses poor appetite, denies constipation, endorses incontinence of bowel GU: denies dysuria, endorses incontinence of urine MSK:  has increased weakness,  h/o falls reported Skin: has hidradenitis suppurativa Neurological: denies pain but h/o neuropathic pain on left side, denies insomnia Psych: recently appears distressed per nursing and CNAs Heme/lymph/immuno: denies bruises, abnormal bleeding  Physical Exam: Current and past weights:  147.05 lbs with BMI 23.73 on 05/28/21 but now 134.6 lbs on 06/14/21 with BMI 20.46 at 48f 8 in Today's Vitals   06/20/21 1545  BP: 137/81  Pulse: 86  Resp: 18  Temp: 97.8 F (36.6 C)  SpO2: 96%  Weight: 134 lb 9.6 oz (61.1 kg)  Height: _0  (1.727 m)   Body mass index is 20.47 kg/m.  Constitutional: yelling out, unable to speak clearly to say why but does deny pain General: frail appearing, thin EYES: anicteric sclera, lids intact, eye drainage ENMT: intact hearing, oral mucous membranes moist, dentition with some missing teeth, a lot of tartar and holding food and supplement shake  in mouth and drooling it onto his clothing protector and shirt CV: S1S2, RRR, no LE edema Pulmonary: expiratory wheezes, rhonchi, diminished bases, room air, wet cough and gurgling Abdomen: intake 25% at best, normo-active BS + 4 quadrants, soft and non tender, no ascites GU: deferred MSK: sarcopenia, left spastic hemiparesis, nonambulatory--bedbound since prior to admission (daughter cared for at home before he came here) Skin: warm and dry, no rashes or wounds on visible skin Neuro:  generalized weakness,  advanced cognitive impairment, speaks only a few words, speech dysarthric Psych: anxious affect Hem/lymph/immuno: no widespread bruising  CURRENT PROBLEM LIST:  Patient Active  Problem List   Diagnosis Date Noted   Acute deep vein thrombosis (DVT) of iliac vein of left lower extremity ([place]) 05/25/2021   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) 05/25/2021   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) 05/25/2021   Scrotal edema 05/25/2021   History of cerebrovascular accident (CVA) with residual deficit 05/25/2021   Anxiety 05/25/2021   Osteoarthritis 05/25/2021   Hypomagnesemia 05/22/2021   Goals of care, counseling/discussion    Palliative care by specialist    Aspiration pneumonia ([place]) 05/16/2021   Pressure injury of skin 03/13/2021   Hypophosphatemia 03/13/2021   HCAP (healthcare-associated pneumonia) 03/13/2021   Hypokalemia 03/12/2021   Dehydration 03/12/2021   Prolonged QT interval 03/12/2021   Generalized weakness 03/12/2021   Seizure disorder ([place]) 03/12/2021   Cellulitis of left hand 12/15/2019   Wrist pain 12/15/2019   Closed left acetabular fracture ([place]) 12/14/2019   SIRS (systemic inflammatory response syndrome) ([place]) 12/14/2019   Protein-calorie malnutrition ([place]) 05/20/2017   TIA (transient ischemic attack) 05/20/2017   Iliac artery dissection ([place]) 05/20/2017   AKI (acute kidney injury) ([place]) 05/12/2017   Stroke ([place]) 05/11/2017   Femur fracture ([place]) 05/11/2017   Chest pain 02/09/2016   HTN (hypertension) 02/09/2016   Dementia with behavioral disturbance ([place]) 02/09/2016   Chest pain at rest 02/09/2016   Cerebral infarction ([place]) 02/01/2014   Benign essential HTN 02/01/2014   PAST MEDICAL HISTORY:  Active Ambulatory Problems    Diagnosis Date Noted   Cerebral infarction ([place]) 02/01/2014   Benign essential HTN 02/01/2014   Chest pain 02/09/2016   HTN (hypertension) 02/09/2016   Dementia with behavioral disturbance ([place]) 02/09/2016   Chest pain at rest 02/09/2016   Stroke ([place]) 05/11/2017   Femur fracture ([place]) 05/11/2017   AKI (acute kidney injury) ([place])  05/12/2017   Protein-calorie malnutrition ([place]) 05/20/2017   TIA (transient ischemic attack) 05/20/2017   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   Closed left acetabular fracture ([place]) 12/14/2019   SIRS (systemic inflammatory response syndrome) ([place]) 12/14/2019   Cellulitis of left hand 12/15/2019   Wrist pain 12/15/2019   Hypokalemia 03/12/2021   Dehydration 03/12/2021   Prolonged QT interval 03/12/2021   Generalized weakness 03/12/2021   Seizure disorder ([place]) 03/12/2021   Pressure injury of skin 03/13/2021   Hypophosphatemia 03/13/2021   HCAP (healthcare-associated pneumonia) 03/13/2021   Aspiration pneumonia ([place]) 05/16/2021   Goals of care, counseling/discussion    Palliative care by specialist    Hypomagnesemia 05/22/2021   Acute deep vein thrombosis (DVT) of iliac vein of left lower extremity ([place]) 05/25/2021   Aneurysm of right common iliac artery ([place]) 05/25/2021   Aneurysm of right internal iliac artery ([place]) 05/25/2021   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) 05/25/2021   Scrotal edema 05/25/2021   History of cerebrovascular accident (CVA)  with residual deficit 05/25/2021   Anxiety 05/25/2021   Osteoarthritis 05/25/2021   Resolved Ambulatory Problems    Diagnosis Date Noted   Seizures ([place]) 05/19/2017   Past Medical History:  Diagnosis Date   Asthma    Dementia ([place])    Gout    Hypertension    SOCIAL HX:  Social History   Tobacco Use   Smoking status: Former   Smokeless tobacco: Never  Substance Use Topics   Alcohol use: No   FAMILY HX:  Family History  Problem Relation Age of Onset   Hyperlipidemia Mother    Hypertension Mother       ALLERGIES:  Allergies  Allergen Reactions   Penicillins Hives and Swelling   Lactose Intolerance ([place]) Other (See Comments)    unknown   Valproate Sodium     Other reaction(s): Other (See Comments)   Lexapro [Escitalopram] Diarrhea     PERTINENT MEDICATIONS:  Prescription atorvastatin tablet; 80 mg;  amt: 80 MG; oral Spec. Inst: GIVE 1 TAB PO Q DAY FOR HLD 06/19/21 6:40 PM  Prescription baclofen tablet; 5 mg; amt: 2 tabs; oral Spec. Inst: pain management, hold for lethargy/change in Mental status 06/20/21 9:47 AM  Prescription buspirone tablet; 5 mg; amt: 0.5 tab; oral Spec. Inst: TAKE 0.5 TAB (2.5 MG) PO BID - ANXIETY 06/20/21 9:47 AM  Prescription cyanocobalamin (vitamin B-12) tablet extended release; 1,000 mcg; amt: 1 TAB; oral Spec. Inst: GIVE 1 TAB PO Q DAY FOR SUPPLEMENT 06/20/21 9:47 AM  Prescription diclofenac sodium [OTC] gel; 1 %; amt: 2 GRAMS; topical Spec. Inst: APPLY 2 GRAMS QID PRN FOR PAIN TO AFFECTED AREA   Prescription doxycycline hyclate capsule; 100 mg; amt: 1 tab; oral 06/20/21 9:47 AM  Prescription Eliquis (apixaban) tablet; 5 mg; amt: 5 MG; oral Spec. Inst: GIVE 5 MG PO BID FOR DVT PROPHYLAXIS 06/20/21 9:47 AM  Prescription ferrous gluconate tablet; 324 mg (38 mg iron); amt: 1 tab; oral Spec. Inst: give w/ OJ-anemia 06/19/21 9:33 PM  Prescription gabapentin capsule; 100 mg; amt: 2 caps; oral Spec. Inst: pain 06/20/21 9:47 AM  Prescription levetiracetam tablet; 500 mg; amt: 500 MG; oral Spec. Inst: GIVE 500 MG PO BID FOR SEIZURE PREVENTION 06/20/21 9:47 AM  Prescription lorazepam - Schedule IV tablet; 0.5 mg; amt: 0.5 MG; oral Spec. Inst: GIVE 0.5 MG PO BID FOR ANXIETY may crush in juice/sauce 06/20/21 9:47 AM  Prescription Mucinex (guaifenesin) [OTC] tablet extended release 12hr; 600 mg; amt: 1 tab; oral Spec. Inst: dysphagia 06/20/21 9:47 AM  Prescription omeprazole capsule,delayed release(DR/EC); 10 mg; amt: 10 MG; oral Spec. Inst: GIVE 1 CAPSULE PO Q DAY FOR GERD 06/20/21 5:45 AM  Prescription potassium chloride packet; 20 mEq; amt: 1 packet; oral Spec. Inst: may mix with food or water for potassium supplementation 06/20/21 9:47 AM  Prescription prednisone tablet; 20 mg; amt: 1 tab; oral Spec. Inst: for Hidradenitis suppurativa 06/20/21 9:47  AM  Prescription Pro-Stat Sugar Free (amino acids-protein hydrolys) liquid; 15-100 gram-kcal/30 mL; amt: 33m; oral Spec. Inst: GIVE 30 MLS PO BID FOR NUTRITIONAL SUPPORT 06/20/21 9:47 AM  Prescription Probiotic (lactobacillus acidophilus) capsule; 10 billion cell; amt: 1; oral 06/19/21 6:40 PM  Prescription silver sulfadiazine cream; 1 %; amt: 1 APPLICATION; topical Spec. Inst: WMaconRIGHT BUTTOCK/ GLUTEAL FOLD WITH SOAP AND WATER, RINSE WELL, PAT DRY, APPLY SILVER SULFADIAZINE CREAM TO AFFECTED AREA, LEAVE OPEN TO AIR, APPLY TWICE A DAY, FOR HS 06/20/21 9:47 AM  Prescription Vitamin D3 (cholecalciferol (vitamin d3)) tablet; 125 mcg (5,000 unit);  amt: 1 tab; oral Spec. Inst: supplement   Thank you for the opportunity to participate in the care of Mr. Vanhook.  The palliative care team will continue to follow. Please call our office at [phone] if we can be of additional assistance.   Hollace Kinnier, DO   COVID-19 PATIENT SCREENING TOOL Asked and negative response unless otherwise noted:  Have you had symptoms of covid, tested positive or been in contact with someone with symptoms/positive test in the past 5-10 days? no

## 2021-06-21 ENCOUNTER — Encounter: Payer: Self-pay | Admitting: Internal Medicine

## 2021-06-21 DIAGNOSIS — F01C4 Vascular dementia, severe, with anxiety: Secondary | ICD-10-CM | POA: Insufficient documentation

## 2021-06-21 DIAGNOSIS — R1319 Other dysphagia: Secondary | ICD-10-CM | POA: Insufficient documentation

## 2021-06-21 DIAGNOSIS — I69354 Hemiplegia and hemiparesis following cerebral infarction affecting left non-dominant side: Secondary | ICD-10-CM | POA: Insufficient documentation

## 2021-06-23 ENCOUNTER — Other Ambulatory Visit: Payer: Self-pay

## 2021-06-23 ENCOUNTER — Encounter (HOSPITAL_COMMUNITY): Payer: Self-pay

## 2021-06-23 ENCOUNTER — Emergency Department (HOSPITAL_COMMUNITY): Payer: Medicare Other

## 2021-06-23 ENCOUNTER — Emergency Department (HOSPITAL_COMMUNITY)
Admission: EM | Admit: 2021-06-23 | Discharge: 2021-06-23 | Disposition: A | Discharge status: Home / Self Care | Payer: Medicare Other | Attending: Emergency Medicine | Admitting: Emergency Medicine

## 2021-06-23 DIAGNOSIS — Z87891 Personal history of nicotine dependence: Secondary | ICD-10-CM | POA: Diagnosis not present

## 2021-06-23 DIAGNOSIS — G40909 Epilepsy, unspecified, not intractable, without status epilepticus: Secondary | ICD-10-CM | POA: Insufficient documentation

## 2021-06-23 DIAGNOSIS — R7309 Other abnormal glucose: Secondary | ICD-10-CM | POA: Insufficient documentation

## 2021-06-23 DIAGNOSIS — F0390 Unspecified dementia without behavioral disturbance: Secondary | ICD-10-CM | POA: Insufficient documentation

## 2021-06-23 DIAGNOSIS — I10 Essential (primary) hypertension: Secondary | ICD-10-CM | POA: Diagnosis not present

## 2021-06-23 DIAGNOSIS — J45909 Unspecified asthma, uncomplicated: Secondary | ICD-10-CM | POA: Insufficient documentation

## 2021-06-23 DIAGNOSIS — R0603 Acute respiratory distress: Secondary | ICD-10-CM | POA: Diagnosis present

## 2021-06-23 DIAGNOSIS — R569 Unspecified convulsions: Secondary | ICD-10-CM

## 2021-06-23 HISTORY — DX: Cerebral infarction, unspecified: I63.9

## 2021-06-23 HISTORY — DX: Hemiplegia and hemiparesis following cerebral infarction affecting left non-dominant side: I69.354

## 2021-06-23 HISTORY — DX: Hypomagnesemia: E83.42

## 2021-06-23 HISTORY — DX: Epilepsy, unspecified, not intractable, without status epilepticus: G40.909

## 2021-06-23 HISTORY — DX: Acute embolism and thrombosis of unspecified deep veins of unspecified lower extremity: I82.409

## 2021-06-23 HISTORY — DX: Pneumonitis due to inhalation of food and vomit: J69.0

## 2021-06-23 HISTORY — DX: Dysphagia, unspecified: R13.10

## 2021-06-23 HISTORY — DX: Muscle wasting and atrophy, not elsewhere classified, multiple sites: M62.59

## 2021-06-23 LAB — BLOOD GAS, VENOUS
Acid-Base Excess: 5.7 mmol/L — ABNORMAL HIGH (ref 0.0–2.0)
Bicarbonate: 32.1 mmol/L — ABNORMAL HIGH (ref 20.0–28.0)
O2 Saturation: 37.6 %
Patient temperature: 37
pCO2, Ven: 53 mmHg (ref 44–60)
pH, Ven: 7.39 (ref 7.25–7.43)
pO2, Ven: <31 mmHg — CL (ref 32–45)

## 2021-06-23 LAB — COMPREHENSIVE METABOLIC PANEL
ALT: 37 U/L (ref 0–44)
AST: 35 U/L (ref 15–41)
Albumin: 3.2 g/dL — ABNORMAL LOW (ref 3.5–5.0)
Alkaline Phosphatase: 73 U/L (ref 38–126)
Anion gap: 10 (ref 5–15)
BUN: 14 mg/dL (ref 8–23)
CO2: 29 mmol/L (ref 22–32)
Calcium: 9.2 mg/dL (ref 8.9–10.3)
Chloride: 100 mmol/L (ref 98–111)
Creatinine, Ser: 0.74 mg/dL (ref 0.61–1.24)
GFR, Estimated: >60 mL/min (ref >60)
Glucose, Bld: 96 mg/dL (ref 70–99)
Potassium: 3.6 mmol/L (ref 3.5–5.1)
Sodium: 139 mmol/L (ref 135–145)
Total Bilirubin: 1.1 mg/dL (ref 0.3–1.2)
Total Protein: 7.1 g/dL (ref 6.5–8.1)

## 2021-06-23 LAB — CBC
HCT: 36 % — ABNORMAL LOW (ref 39.0–52.0)
Hemoglobin: 11.4 g/dL — ABNORMAL LOW (ref 13.0–17.0)
MCH: 27.7 pg (ref 26.0–34.0)
MCHC: 31.7 g/dL (ref 30.0–36.0)
MCV: 87.4 fL (ref 80.0–100.0)
Platelets: 230 10*3/uL (ref 150–400)
RBC: 4.12 MIL/uL — ABNORMAL LOW (ref 4.22–5.81)
RDW: 18.3 % — ABNORMAL HIGH (ref 11.5–15.5)
WBC: 5.7 10*3/uL (ref 4.0–10.5)
nRBC: 0 % (ref 0.0–0.2)

## 2021-06-23 LAB — LACTIC ACID, PLASMA: Lactic Acid, Venous: 2.9 mmol/L (ref 0.5–1.9)

## 2021-06-23 LAB — CBG MONITORING, ED: Glucose-Capillary: 73 mg/dL (ref 70–99)

## 2021-06-23 MED ORDER — SODIUM CHLORIDE 0.9 % IV BOLUS
1000.0000 mL | Freq: Once | INTRAVENOUS | Status: AC
  Administered 2021-06-23: 1000 mL via INTRAVENOUS

## 2021-06-23 NOTE — ED Provider Notes (Signed)
Patient signed out to me is pending CT imaging.  CT results show no acute findings.  Patient remains back at his baseline mental status awake and alert, 100% oxygenation with normal respiratory rate on room air.  We will recommend outpatient follow-up with his doctor within the week.  Recommending immediate return for worsening symptoms or any additional concerns.   Cheryll Cockayne, MD 06/23/21 [phone]

## 2021-06-23 NOTE — ED Provider Notes (Signed)
[facility]-EMERGENCY DEPT [facility] Emergency Department Provider Note MRN:  [id number]  Arrival date & time: 06/23/21     Chief Complaint   Respiratory distress History of Present Illness   Johnathan Cedano. is a 87 y.o. year-old male with a history of hypertension, seizure, stroke presenting to the ED with chief complaint of respiratory distress.  Per report patient requiring rescue breathing in route, concern for aspiration in the setting of seizure, coming from nursing home.  Review of Systems  I was unable to obtain a full/accurate HPI, PMH, or ROS due to the patient's dementia.  Patient's Health History    Past Medical History:  Diagnosis Date   Asthma    Cerebral infarction (HCC)    Dementia (HCC)    DVT (deep venous thrombosis) (HCC)    Dysphagia    Epilepsy (HCC)    Gout    Hemiparesis affecting left side as late effect of cerebrovascular accident (CVA) (HCC)    Hypertension    Hypomagnesemia    Muscle wasting and atrophy, not elsewhere classified, multiple sites    Pneumonitis due to inhalation of food and vomit (HCC)    Seizures (HCC)    Stroke [facility])     Past Surgical History:  Procedure Laterality Date   BACK SURGERY     FEMUR IM NAIL Left 05/13/2017   Procedure: INTRAMEDULLARY (IM) NAIL FEMORAL;  Surgeon: [name], MD;  Location: [facility] OR;  Service: Orthopedics;  Laterality: Left;   KNEE SURGERY      Family History  Problem Relation Age of Onset   Hyperlipidemia Mother    Hypertension Mother     Social History   Socioeconomic History   Marital status: Single    Spouse name: Not on file   Number of children: Not on file   Years of education: Not on file   Highest education level: Not on file  Occupational History   Not on file  Tobacco Use   Smoking status: Former   Smokeless tobacco: Never  Vaping Use   Vaping Use: Never used  Substance and Sexual Activity   Alcohol use: No   Drug use: No   Sexual activity: Not on file  Other Topics  Concern   Not on file  Social History Narrative   Not on file   Social Determinants of Health   Financial Resource Strain: Not on file  Food Insecurity: Not on file  Transportation Needs: Not on file  Physical Activity: Not on file  Stress: Not on file  Social Connections: Not on file  Intimate Partner Violence: Not on file     Physical Exam   Vitals:   06/23/21 0618 06/23/21 0619  BP:    Pulse: 81   Resp:  18  Temp: (!) 97.4 F (36.3 C)   SpO2: 98%     CONSTITUTIONAL: Chronically ill-appearing, NAD NEURO/PSYCH: Awake, oriented to name, follows commands, moves all extremities EYES:  eyes equal and reactive ENT/NECK:  no LAD, no JVD CARDIO: Regular rate, well-perfused, normal S1 and S2 PULM:  CTAB no wheezing or rhonchi GI/GU:  non-distended, non-tender MSK/SPINE:  No gross deformities, no edema, chronic contractures SKIN:  no rash, atraumatic   *Additional and/or pertinent findings included in MDM below  Diagnostic and Interventional Summary    EKG Interpretation  Date/Time:  Saturday Jun 23 2021 06:53:02 EDT Ventricular Rate:  73 PR Interval:  186 QRS Duration: 114 QT Interval:  417 QTC Calculation: 460 R Axis:   46  Text Interpretation: Sinus rhythm Borderline intraventricular conduction delay Abnormal R-wave progression, early transition Borderline T abnormalities, inferior leads Artifact in lead(s) I II III aVR aVL aVF V1 V2 V3 V4 Confirmed by Kennis Carina [phone]) on 06/23/2021 6:57:23 AM       Labs Reviewed  CBC - Abnormal; Notable for the following components:      Result Value   RBC 4.12 (*)    Hemoglobin 11.4 (*)    HCT 36.0 (*)    RDW 18.3 (*)    All other components within normal limits  COMPREHENSIVE METABOLIC PANEL  LACTIC ACID, PLASMA  BLOOD GAS, VENOUS  CBG MONITORING, ED    DG Chest Port 1 View  Final Result    CT HEAD WO CONTRAST ( )    (Results Pending)    Medications  sodium chloride 0.9 % bolus 1,000 mL (1,000 mLs  Intravenous New Bag/Given 06/23/21 0617)     Procedures  /  Critical Care .Critical Care Performed by: Sabas Sous, MD Authorized by: Sabas Sous, MD   Critical care provider statement:    Critical care time (minutes):  35   Critical care was necessary to treat or prevent imminent or life-threatening deterioration of the following conditions:  Respiratory failure   Critical care was time spent personally by me on the following activities:  Development of treatment plan with patient or surrogate, discussions with consultants, evaluation of patient's response to treatment, examination of patient, ordering and review of laboratory studies, ordering and review of radiographic studies, ordering and performing treatments and interventions, pulse oximetry, re-evaluation of patient's condition and review of old charts  ED Course and Medical Decision Making  Initial Impression and Ddx Suspected seizure and aspiration event at care facility, was likely postictal with EMS with poor respiratory effort.  Seems improved here in the emergency department, following commands, vital signs reassuring.  Awaiting labs, chest x-ray.  Past medical/surgical history that increases complexity of ED encounter: History of seizure disorder, stroke  Interpretation of Diagnostics I personally reviewed the EKG and my interpretation is as follows: No significant change from prior    X-ray unremarkable, awaiting labs  Patient Reassessment and Ultimate Disposition/Management Work-up pending, patient continues to have normal vital signs, seemingly at baseline.  All may be explained by postictal state, candidate for discharge if continues to do well and reassuring work-up.  Signed out to oncoming provider at shift change.  Patient management required discussion with the following services or consulting groups:  None  Complexity of Problems Addressed Acute illness or injury that poses threat of life of bodily  function  Additional Data Reviewed and Analyzed Further history obtained from: EMS on arrival  Additional Factors Impacting ED Encounter Risk Consideration of hospitalization  Elmer Sow. Pilar Plate, MD [facility] Emergency Medicine [facility] [email or web address]  Final Clinical Impressions(s) / ED Diagnoses     ICD-10-CM   1. Seizure (HCC)  R56.9       ED Discharge Orders     None        Discharge Instructions Discussed with and Provided to Patient:   Discharge Instructions   None      Sabas Sous, MD 06/23/21 [phone]

## 2021-06-23 NOTE — ED Notes (Signed)
[facility] is call to transport the patient to [facility]

## 2021-06-23 NOTE — ED Notes (Signed)
Report given to [facility] at the [facility].

## 2021-06-23 NOTE — Discharge Instructions (Addendum)
Call your primary care doctor or specialist as discussed in the next 2-3 days.   Return immediately back to the ER if:  Your symptoms worsen within the next 12-24 hours. You develop new symptoms such as new fevers, persistent vomiting, new pain, shortness of breath, or new weakness or numbness, or if you have any other concerns.  

## 2021-06-23 NOTE — ED Triage Notes (Signed)
Pt from [facility] via EMS after having seizure with suspected aspiration. EMS reports that they had to use BVM to provide rescue breaths for about 6 minutes en route to hospital due to hypoxia and decreased level of consciousness. EMS then reports they had to intermittently use BVM, as pt would become less responsive "after abut 2 minutes". Pt alert upon arrival, on 4L via nasal cannula. Pt c/o pain whenever L arm is touched.

## 2021-10-28 DEATH — deceased

## 2023-04-21 IMAGING — CT CT HEAD W/O CM
3 series · 14 of 47 positions shown, 16 images · non-contrast
Comparison: 09/27/2020

CLINICAL DATA: Mental status change, unknown cause Had a seizure,
unknown if fell. Some vomiting. Rule out intracranial abnormality.

EXAM:
CT HEAD WITHOUT CONTRAST
TECHNIQUE: Contiguous axial images were obtained from the base of the skull
through the vertex without intravenous contrast.

[Series 2: head wo · axial · 0.53mm/px · z∈[-124,+16]mm · 8 of 34 slices shown, 10 images]
[im 3/34  brain]
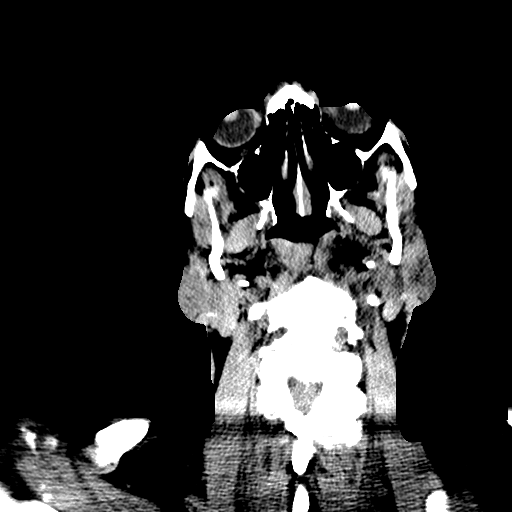
[im 3/34  bone]
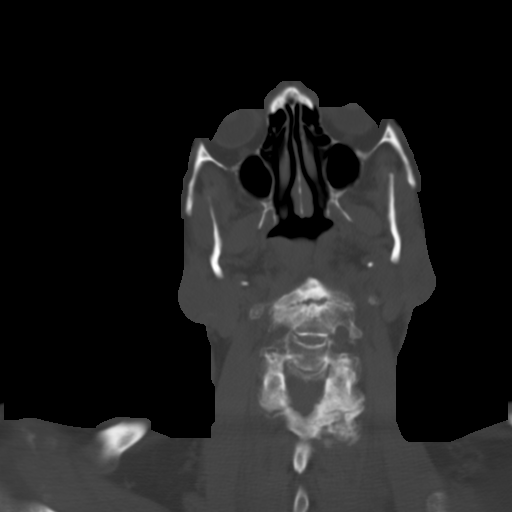
[im 7/34  brain]
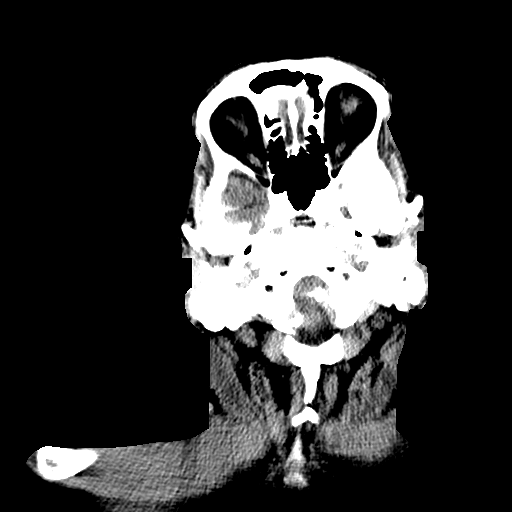
[im 11/34  brain]
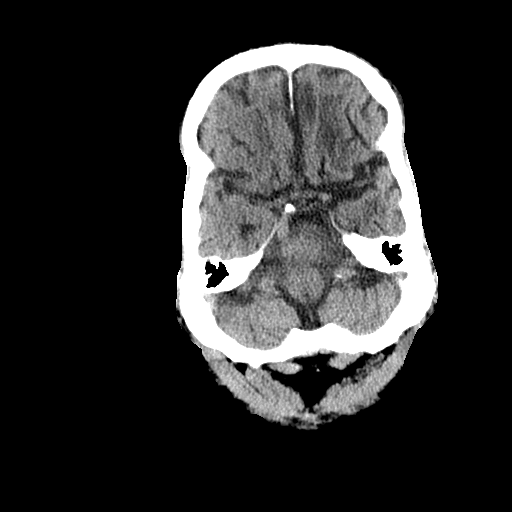
[im 15/34  brain]
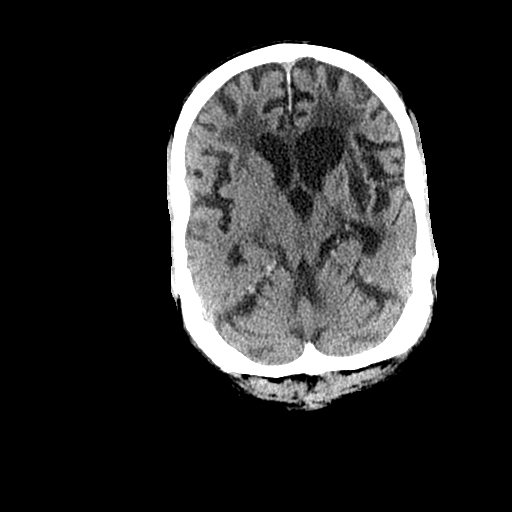
[im 19/34  brain]
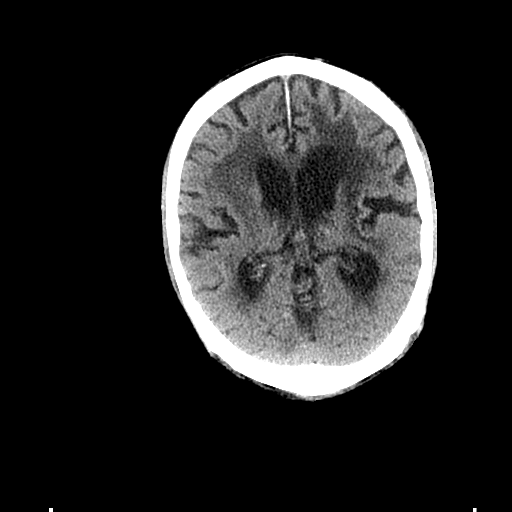
[im 19/34  bone]
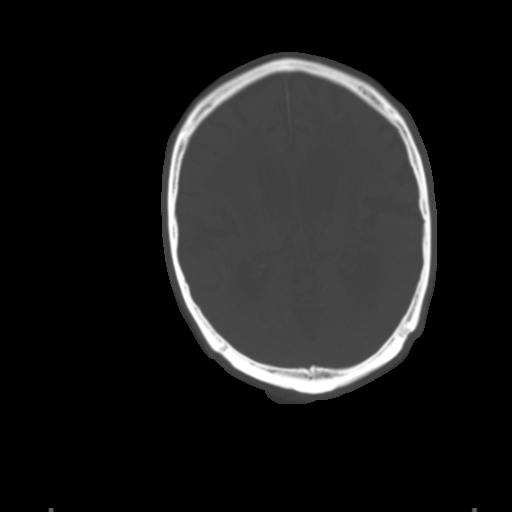
[im 23/34  brain]
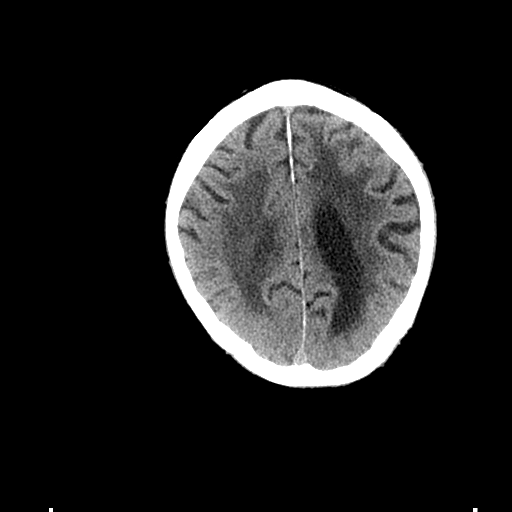
[im 27/34  brain]
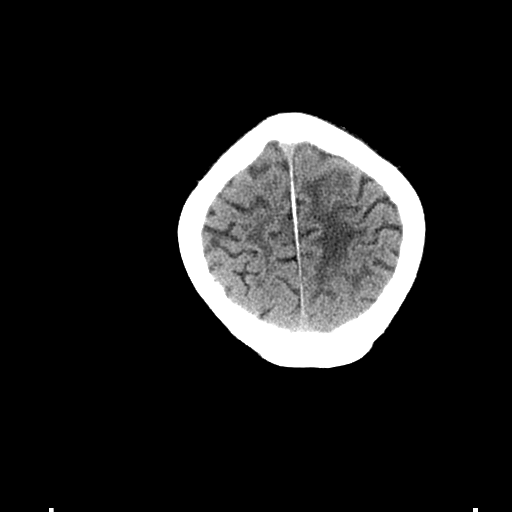
[im 31/34  brain]
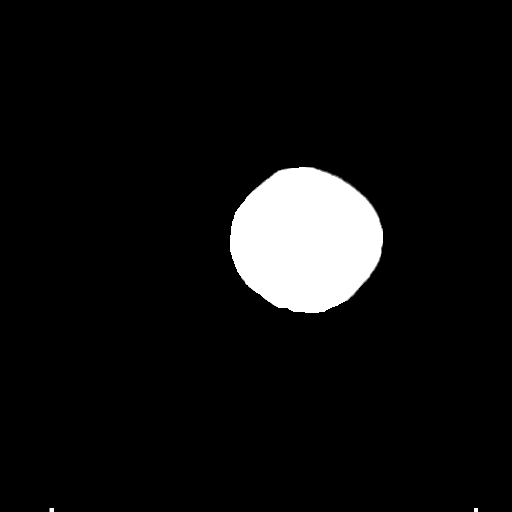

[Series 5: coronal soft tissue · coronal · 0.34mm/px · 3 of 75 slices shown]
[im 25/75  brain]
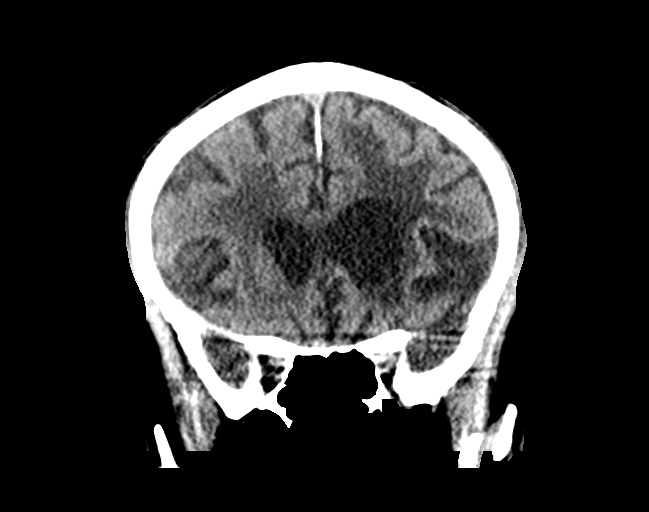
[im 33/75  brain]
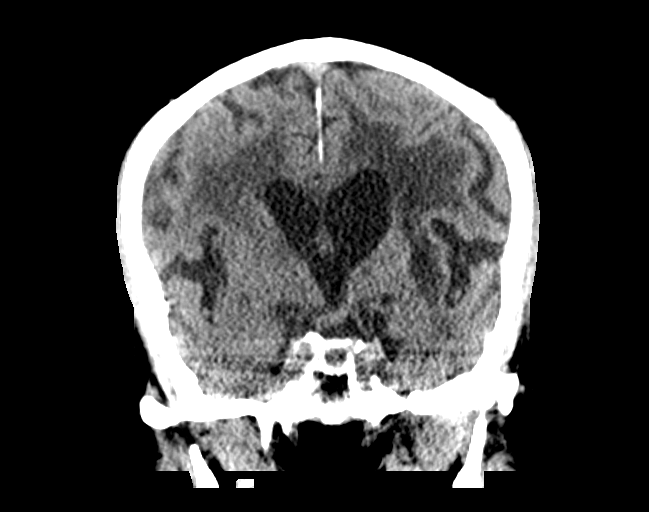
[im 42/75  brain]
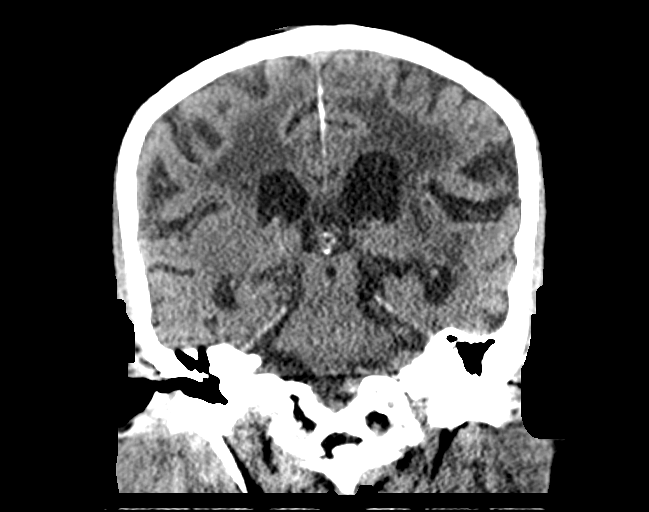

[Series 6: sagittal soft tissue · sagittal · 0.33mm/px · 3 of 57 slices shown]
[im 19/57  brain]
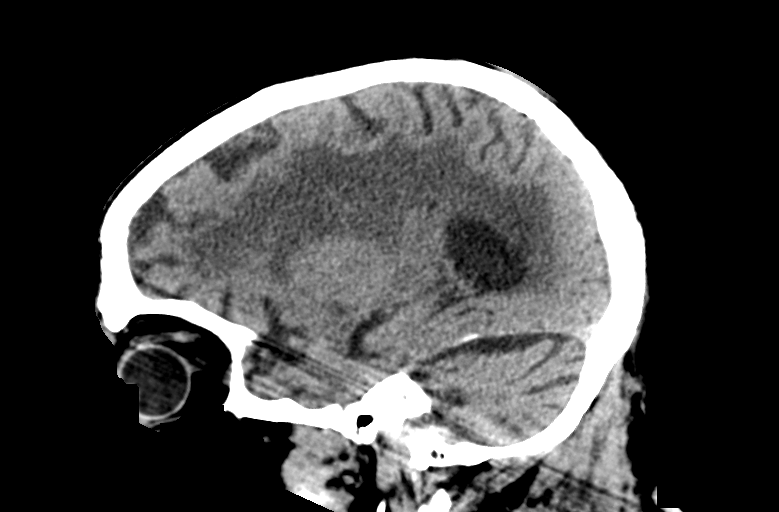
[im 29/57  brain]
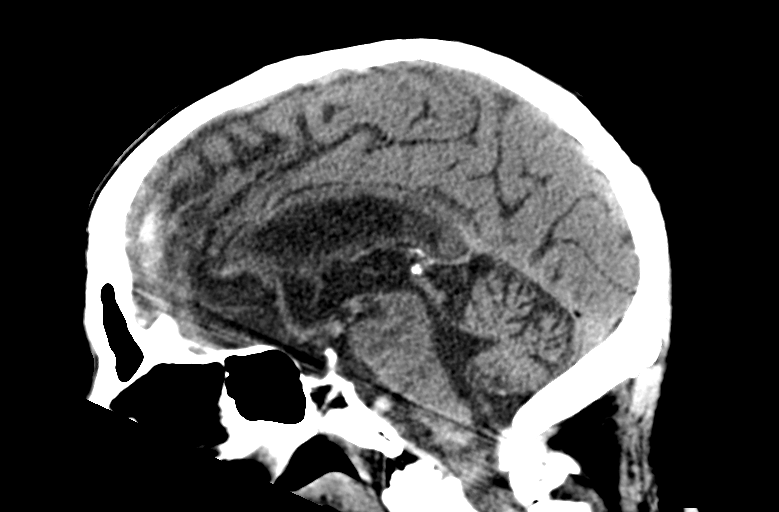
[im 38/57  brain]
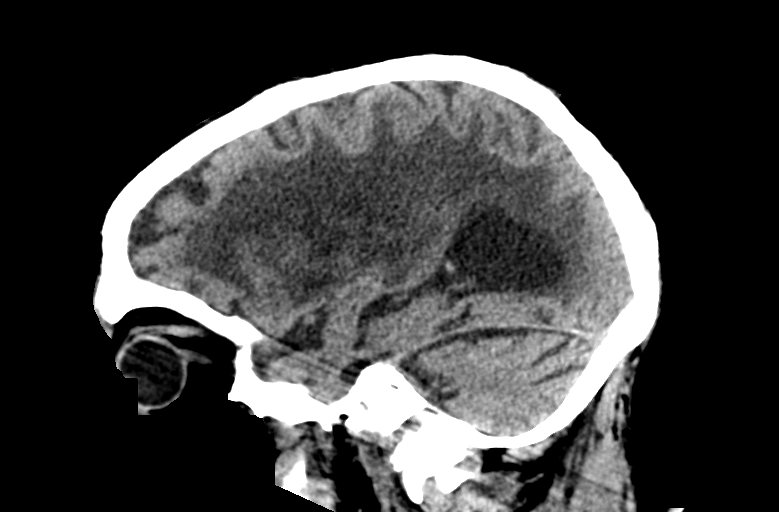

[14 of 47 positions shown; findings below may reference images not displayed]

FINDINGS: Brain: There is atrophy and chronic small vessel disease changes. No
acute intracranial abnormality. Specifically, no hemorrhage,
hydrocephalus, mass lesion, acute infarction, or significant
intracranial injury.

Vascular: No hyperdense vessel or unexpected calcification.

Skull: No acute calvarial abnormality.

Sinuses/Orbits: No acute findings

Other: None
IMPRESSION: Atrophy, chronic microvascular disease.

No acute intracranial abnormality.
